# Patient Record
Sex: Male | Born: 1950 | Race: Asian | Hispanic: No | Marital: Single | Smoking: Current every day smoker
Health system: Southern US, Community
[De-identification: ages and names within clinical notes are randomized; demographics above are authoritative.]

## PROBLEM LIST (undated history)

## (undated) ENCOUNTER — Emergency Department (HOSPITAL_COMMUNITY): Admission: EM | Payer: Medicare Other

## (undated) DIAGNOSIS — M1990 Unspecified osteoarthritis, unspecified site: Secondary | ICD-10-CM

## (undated) DIAGNOSIS — B1920 Unspecified viral hepatitis C without hepatic coma: Secondary | ICD-10-CM

## (undated) DIAGNOSIS — G709 Myoneural disorder, unspecified: Secondary | ICD-10-CM

## (undated) DIAGNOSIS — K219 Gastro-esophageal reflux disease without esophagitis: Secondary | ICD-10-CM

## (undated) DIAGNOSIS — I959 Hypotension, unspecified: Secondary | ICD-10-CM

## (undated) DIAGNOSIS — F1011 Alcohol abuse, in remission: Secondary | ICD-10-CM

## (undated) HISTORY — DX: Unspecified viral hepatitis C without hepatic coma: B19.20

## (undated) HISTORY — DX: Gastro-esophageal reflux disease without esophagitis: K21.9

## (undated) SURGERY — Surgical Case
Anesthesia: *Unknown

---

## 2005-05-20 ENCOUNTER — Ambulatory Visit: Payer: Self-pay | Admitting: Family Medicine

## 2017-02-20 ENCOUNTER — Encounter (HOSPITAL_COMMUNITY): Payer: Self-pay | Admitting: *Deleted

## 2017-02-20 ENCOUNTER — Ambulatory Visit (HOSPITAL_COMMUNITY)
Admission: EM | Admit: 2017-02-20 | Discharge: 2017-02-20 | Disposition: A | Discharge status: Home / Self Care | Payer: Self-pay | Attending: Family Medicine | Admitting: Family Medicine

## 2017-02-20 DIAGNOSIS — R51 Headache: Secondary | ICD-10-CM

## 2017-02-20 DIAGNOSIS — R519 Headache, unspecified: Secondary | ICD-10-CM

## 2017-02-20 MED ORDER — IBUPROFEN 600 MG PO TABS: 600.0000 mg | ORAL_TABLET | Freq: Four times a day (QID) | ORAL | 0 refills | Status: DC | PRN

## 2017-02-20 NOTE — Discharge Instructions (Signed)
Try IBUPROFEN as prescribed as needed for headache. Do not use this more than 4 times per week as this may make the headache worse.   Call the number below for [facility] to establish care there for ongoing treatment of headache.   If your symptoms worsen, seek medical attention right away.   Get plenty of sleep, stay hydrated with water and STOP smoking. These will help with the headache.

## 2017-02-20 NOTE — ED Provider Notes (Signed)
[name]    CSN: [id number] Arrival date & time: 02/20/17  1445     History   Chief Complaint Chief Complaint  Patient presents with  . Headache    HPI Donald Aguilar is a 66 y.o. [name] male presenting for occipital headache for 2 - 3 years.   The headache is described as "aching" daily, moderate, constant throughout the day not worse with lights or position, no medications tried, not better with rest, darkness. It starts in the back of the head and radiates to the top on both sides not involving the face. No visual changes, neck pain/stiffness, or history of head trauma. Takes no medications. Smokes daily.   Family friend in room and serves as Optometrist per patient request. he declines alternative interpretation services.   HPI  History reviewed. No pertinent past medical history.  There are no active problems to display for this patient.   History reviewed. No pertinent surgical history.     Home Medications    Prior to Admission medications   Medication Sig Start Date End Date Taking? Authorizing Provider  ibuprofen (ADVIL,MOTRIN) 600 MG tablet Take 1 tablet (600 mg total) by mouth every 6 (six) hours as needed. 02/20/17   Patrecia Pour, MD    Family History History reviewed. No pertinent family history.  Social History Social History  Substance Use Topics  . Smoking status: Current Every Day Smoker  . Smokeless tobacco: Not on file  . Alcohol use Yes     Allergies   Patient has no known allergies.   Review of Systems Review of Systems Per HPI  Physical Exam Triage Vital Signs ED Triage Vitals  Enc Vitals Group     BP 02/20/17 1532 (!) 147/88     Pulse Rate 02/20/17 1532 74     Resp 02/20/17 1532 18     Temp 02/20/17 1532 98.6 F (37 C)     Temp Source 02/20/17 1532 Oral     SpO2 02/20/17 1532 100 %     Weight --      Height --      Head Circumference --      Peak Flow --      Pain Score 02/20/17 1535 5     Pain Loc --      Pain Edu? --      Excl. in [place]? --    No data found.   Updated Vital Signs BP (!) 147/88 (BP Location: Right Arm)   Pulse 74   Temp 98.6 F (37 C) (Oral)   Resp 18   SpO2 100%   Physical Exam  Constitutional: He is oriented to person, place, and time. He appears well-developed and well-nourished.  HENT:  Head: Normocephalic and atraumatic.  Left Ear: External ear normal.  Nose: Nose normal.  Mouth/Throat: No oropharyngeal exudate.  right ear canal occluded with cerumen. Very poor dentition.  Eyes: Conjunctivae and EOM are normal.  Neck: Neck supple.  Cardiovascular: Normal rate and regular rhythm.   No murmur heard. Pulmonary/Chest: Effort normal and breath sounds normal. No respiratory distress.  Abdominal: Soft. There is no tenderness.  Musculoskeletal: Normal range of motion. He exhibits no edema or deformity.  Lymphadenopathy:    He has no cervical adenopathy.  Neurological: He is alert and oriented to person, place, and time. He displays normal reflexes. No cranial nerve deficit or sensory deficit. He exhibits normal muscle tone.  Skin: Skin is warm and dry. Capillary refill takes less than  2 seconds.  Psychiatric: He has a normal mood and affect.  Nursing note and vitals reviewed.   UC Treatments / Results  Labs (all labs ordered are listed, but only abnormal results are displayed) Labs Reviewed - No data to display  EKG  EKG Interpretation None       Radiology No results found.  Procedures Procedures (including critical care time)  Medications Ordered in UC Medications - No data to display   Initial Impression / Assessment and Plan / UC Course  I have reviewed the triage vital signs and the nursing notes.  Pertinent labs & imaging results that were available during my care of the patient were reviewed by me and considered in my medical decision making (see chart for details).   Final Clinical Impressions(s) / UC Diagnoses   Final diagnoses:    Headache disorder   66 y.o. male with no medical follow up and chronic headache most typical of tension-type headache without prior treatments. No indication for neuroimaging at this time. Will treat as below: - Trial NSAID prn - Sleep hygiene, hydration - Smoking cessation - Would also likely benefit from propranolol or similar for primary headache prophylaxis and HTN (above goal for age here) - Given information to establish care with [facility] for ongoing management  New Prescriptions New Prescriptions   IBUPROFEN (ADVIL,MOTRIN) 600 MG TABLET    Take 1 tablet (600 mg total) by mouth every 6 (six) hours as needed.     Patrecia Pour, MD 02/20/17 [phone]

## 2017-02-20 NOTE — ED Triage Notes (Signed)
Pt  Reports   Headache    For  sev  Years       Awake  Alert  And  Alert       Oriented        No  Vomiting        pearla       Awake  And  Alert    Oriented    Denies  Any  injury

## 2019-08-21 DIAGNOSIS — K7460 Unspecified cirrhosis of liver: Secondary | ICD-10-CM

## 2019-08-21 HISTORY — DX: Unspecified cirrhosis of liver: K74.60

## 2019-09-07 NOTE — Congregational Nurse Program (Signed)
Home visit with interpreter [name].  Patient was referred by [facility] with whom patient is currently living and his niece [place].  They are concerned about patient's health and the fact he is refusing to seek medical care.  They are uncertain why he is refusing to seek treatment but think it may be because he has no insurance.  He has applied for Medicaid recently but is not yet approved.  Upon arrival patient was lying on sofa and was unable to sit up without assistance.  He was pleasant and cooperative and well oriented.  Abdomen was obviously enlarged and he stated it is painful when he moves and gets up.  States he has had these symptoms for about 1 month.  He also has swelling and numbness in his thighs but not below the knees.  Normal BM's daily.  States he has decreased smoking and alcohol intake.  Also c/o SOB.   Temp 98.6, BP 138/90, pulse 100 and resp. 20.  He does not have a medical provider but agreed to go to ED for evaluation.  [name] RN, Congregational Nurse [phone]

## 2019-09-09 ENCOUNTER — Inpatient Hospital Stay (HOSPITAL_COMMUNITY)
Admission: EM | Admit: 2019-09-09 | Discharge: 2019-09-15 | DRG: 432 | Disposition: A | Discharge status: Home / Self Care | Payer: Medicare Other | Attending: Internal Medicine | Admitting: Internal Medicine

## 2019-09-09 ENCOUNTER — Encounter (HOSPITAL_COMMUNITY): Payer: Self-pay | Admitting: Internal Medicine

## 2019-09-09 ENCOUNTER — Other Ambulatory Visit: Payer: Self-pay

## 2019-09-09 ENCOUNTER — Emergency Department (HOSPITAL_COMMUNITY): Payer: Medicare Other

## 2019-09-09 DIAGNOSIS — K8020 Calculus of gallbladder without cholecystitis without obstruction: Secondary | ICD-10-CM | POA: Diagnosis present

## 2019-09-09 DIAGNOSIS — R0602 Shortness of breath: Secondary | ICD-10-CM | POA: Diagnosis present

## 2019-09-09 DIAGNOSIS — K7469 Other cirrhosis of liver: Secondary | ICD-10-CM | POA: Diagnosis not present

## 2019-09-09 DIAGNOSIS — K2960 Other gastritis without bleeding: Secondary | ICD-10-CM | POA: Diagnosis present

## 2019-09-09 DIAGNOSIS — R188 Other ascites: Secondary | ICD-10-CM | POA: Diagnosis present

## 2019-09-09 DIAGNOSIS — I959 Hypotension, unspecified: Secondary | ICD-10-CM | POA: Diagnosis not present

## 2019-09-09 DIAGNOSIS — E43 Unspecified severe protein-calorie malnutrition: Secondary | ICD-10-CM | POA: Diagnosis not present

## 2019-09-09 DIAGNOSIS — K7290 Hepatic failure, unspecified without coma: Secondary | ICD-10-CM | POA: Diagnosis present

## 2019-09-09 DIAGNOSIS — M6284 Sarcopenia: Secondary | ICD-10-CM | POA: Diagnosis present

## 2019-09-09 DIAGNOSIS — B1920 Unspecified viral hepatitis C without hepatic coma: Secondary | ICD-10-CM | POA: Diagnosis present

## 2019-09-09 DIAGNOSIS — R7989 Other specified abnormal findings of blood chemistry: Secondary | ICD-10-CM | POA: Diagnosis not present

## 2019-09-09 DIAGNOSIS — Z79899 Other long term (current) drug therapy: Secondary | ICD-10-CM | POA: Diagnosis not present

## 2019-09-09 DIAGNOSIS — Z20828 Contact with and (suspected) exposure to other viral communicable diseases: Secondary | ICD-10-CM | POA: Diagnosis present

## 2019-09-09 DIAGNOSIS — K3189 Other diseases of stomach and duodenum: Secondary | ICD-10-CM | POA: Diagnosis present

## 2019-09-09 DIAGNOSIS — R64 Cachexia: Secondary | ICD-10-CM | POA: Diagnosis present

## 2019-09-09 DIAGNOSIS — K7460 Unspecified cirrhosis of liver: Principal | ICD-10-CM | POA: Diagnosis present

## 2019-09-09 DIAGNOSIS — K766 Portal hypertension: Secondary | ICD-10-CM | POA: Diagnosis present

## 2019-09-09 DIAGNOSIS — E8770 Fluid overload, unspecified: Secondary | ICD-10-CM | POA: Diagnosis present

## 2019-09-09 DIAGNOSIS — D689 Coagulation defect, unspecified: Secondary | ICD-10-CM | POA: Diagnosis present

## 2019-09-09 DIAGNOSIS — N400 Enlarged prostate without lower urinary tract symptoms: Secondary | ICD-10-CM | POA: Diagnosis present

## 2019-09-09 DIAGNOSIS — E46 Unspecified protein-calorie malnutrition: Secondary | ICD-10-CM | POA: Diagnosis present

## 2019-09-09 DIAGNOSIS — J189 Pneumonia, unspecified organism: Secondary | ICD-10-CM | POA: Diagnosis present

## 2019-09-09 DIAGNOSIS — F17200 Nicotine dependence, unspecified, uncomplicated: Secondary | ICD-10-CM | POA: Diagnosis present

## 2019-09-09 DIAGNOSIS — I8510 Secondary esophageal varices without bleeding: Secondary | ICD-10-CM | POA: Diagnosis present

## 2019-09-09 DIAGNOSIS — R935 Abnormal findings on diagnostic imaging of other abdominal regions, including retroperitoneum: Secondary | ICD-10-CM

## 2019-09-09 DIAGNOSIS — B182 Chronic viral hepatitis C: Secondary | ICD-10-CM | POA: Diagnosis not present

## 2019-09-09 DIAGNOSIS — E8809 Other disorders of plasma-protein metabolism, not elsewhere classified: Secondary | ICD-10-CM | POA: Diagnosis not present

## 2019-09-09 HISTORY — DX: Other ascites: R18.8

## 2019-09-09 LAB — URINALYSIS, ROUTINE W REFLEX MICROSCOPIC
Bilirubin Urine: NEGATIVE
Glucose, UA: NEGATIVE mg/dL
Hgb urine dipstick: NEGATIVE
Ketones, ur: NEGATIVE mg/dL
Leukocytes,Ua: NEGATIVE
Nitrite: NEGATIVE
Protein, ur: 30 mg/dL — AB
Specific Gravity, Urine: >1.046 — ABNORMAL HIGH (ref 1.005–1.030)
pH: 6 (ref 5.0–8.0)

## 2019-09-09 LAB — COMPREHENSIVE METABOLIC PANEL
ALT: 29 U/L (ref 0–44)
AST: 64 U/L — ABNORMAL HIGH (ref 15–41)
Albumin: 1.9 g/dL — ABNORMAL LOW (ref 3.5–5.0)
Alkaline Phosphatase: 77 U/L (ref 38–126)
Anion gap: 11 (ref 5–15)
BUN: 6 mg/dL — ABNORMAL LOW (ref 8–23)
CO2: 26 mmol/L (ref 22–32)
Calcium: 8.4 mg/dL — ABNORMAL LOW (ref 8.9–10.3)
Chloride: 102 mmol/L (ref 98–111)
Creatinine, Ser: 0.82 mg/dL (ref 0.61–1.24)
GFR calc Af Amer: >60 mL/min (ref >60)
GFR calc non Af Amer: >60 mL/min (ref >60)
Glucose, Bld: 124 mg/dL — ABNORMAL HIGH (ref 70–99)
Potassium: 3.3 mmol/L — ABNORMAL LOW (ref 3.5–5.1)
Sodium: 139 mmol/L (ref 135–145)
Total Bilirubin: 2.8 mg/dL — ABNORMAL HIGH (ref 0.3–1.2)
Total Protein: 8.2 g/dL — ABNORMAL HIGH (ref 6.5–8.1)

## 2019-09-09 LAB — LACTIC ACID, PLASMA
Lactic Acid, Venous: 1.5 mmol/L (ref 0.5–1.9)
Lactic Acid, Venous: 1.4 mmol/L (ref 0.5–1.9)

## 2019-09-09 LAB — HEPATITIS PANEL, ACUTE
HCV Ab: REACTIVE — AB
Hep A IgM: NONREACTIVE
Hep B C IgM: NONREACTIVE
Hepatitis B Surface Ag: NONREACTIVE

## 2019-09-09 LAB — CBC WITH DIFFERENTIAL/PLATELET
Abs Immature Granulocytes: 0.03 10*3/uL (ref 0.00–0.07)
Basophils Absolute: 0.1 10*3/uL (ref 0.0–0.1)
Basophils Relative: 1 %
Eosinophils Absolute: 0.2 10*3/uL (ref 0.0–0.5)
Eosinophils Relative: 2 %
HCT: 40.8 % (ref 39.0–52.0)
Hemoglobin: 13.2 g/dL (ref 13.0–17.0)
Immature Granulocytes: 0 %
Lymphocytes Relative: 19 %
Lymphs Abs: 1.5 10*3/uL (ref 0.7–4.0)
MCH: 32.4 pg (ref 26.0–34.0)
MCHC: 32.4 g/dL (ref 30.0–36.0)
MCV: 100 fL (ref 80.0–100.0)
Monocytes Absolute: 0.8 10*3/uL (ref 0.1–1.0)
Monocytes Relative: 11 %
Neutro Abs: 5.3 10*3/uL (ref 1.7–7.7)
Neutrophils Relative %: 67 %
Platelets: 222 10*3/uL (ref 150–400)
RBC: 4.08 MIL/uL — ABNORMAL LOW (ref 4.22–5.81)
RDW: 14.3 % (ref 11.5–15.5)
WBC: 7.9 10*3/uL (ref 4.0–10.5)
nRBC: 0 % (ref 0.0–0.2)

## 2019-09-09 LAB — PROCALCITONIN: Procalcitonin: <0.1 ng/mL

## 2019-09-09 LAB — LIPASE, BLOOD: Lipase: 44 U/L (ref 11–51)

## 2019-09-09 LAB — PROTIME-INR
INR: 1.6 — ABNORMAL HIGH (ref 0.8–1.2)
Prothrombin Time: 18.5 seconds — ABNORMAL HIGH (ref 11.4–15.2)

## 2019-09-09 LAB — AMMONIA: Ammonia: 60 umol/L — ABNORMAL HIGH (ref 9–35)

## 2019-09-09 LAB — SARS CORONAVIRUS 2 (TAT 6-24 HRS): SARS Coronavirus 2: NEGATIVE

## 2019-09-09 LAB — HIV ANTIBODY (ROUTINE TESTING W REFLEX): HIV Screen 4th Generation wRfx: NONREACTIVE

## 2019-09-09 LAB — POC SARS CORONAVIRUS 2 AG -  ED: SARS Coronavirus 2 Ag: NEGATIVE

## 2019-09-09 IMAGING — DX DG CHEST 1V PORT
1 series · 1 of 1 positions shown · non-contrast
Comparison: None.

CLINICAL DATA: Shortness of breath.

EXAM:
PORTABLE CHEST 1 VIEW

[chest]
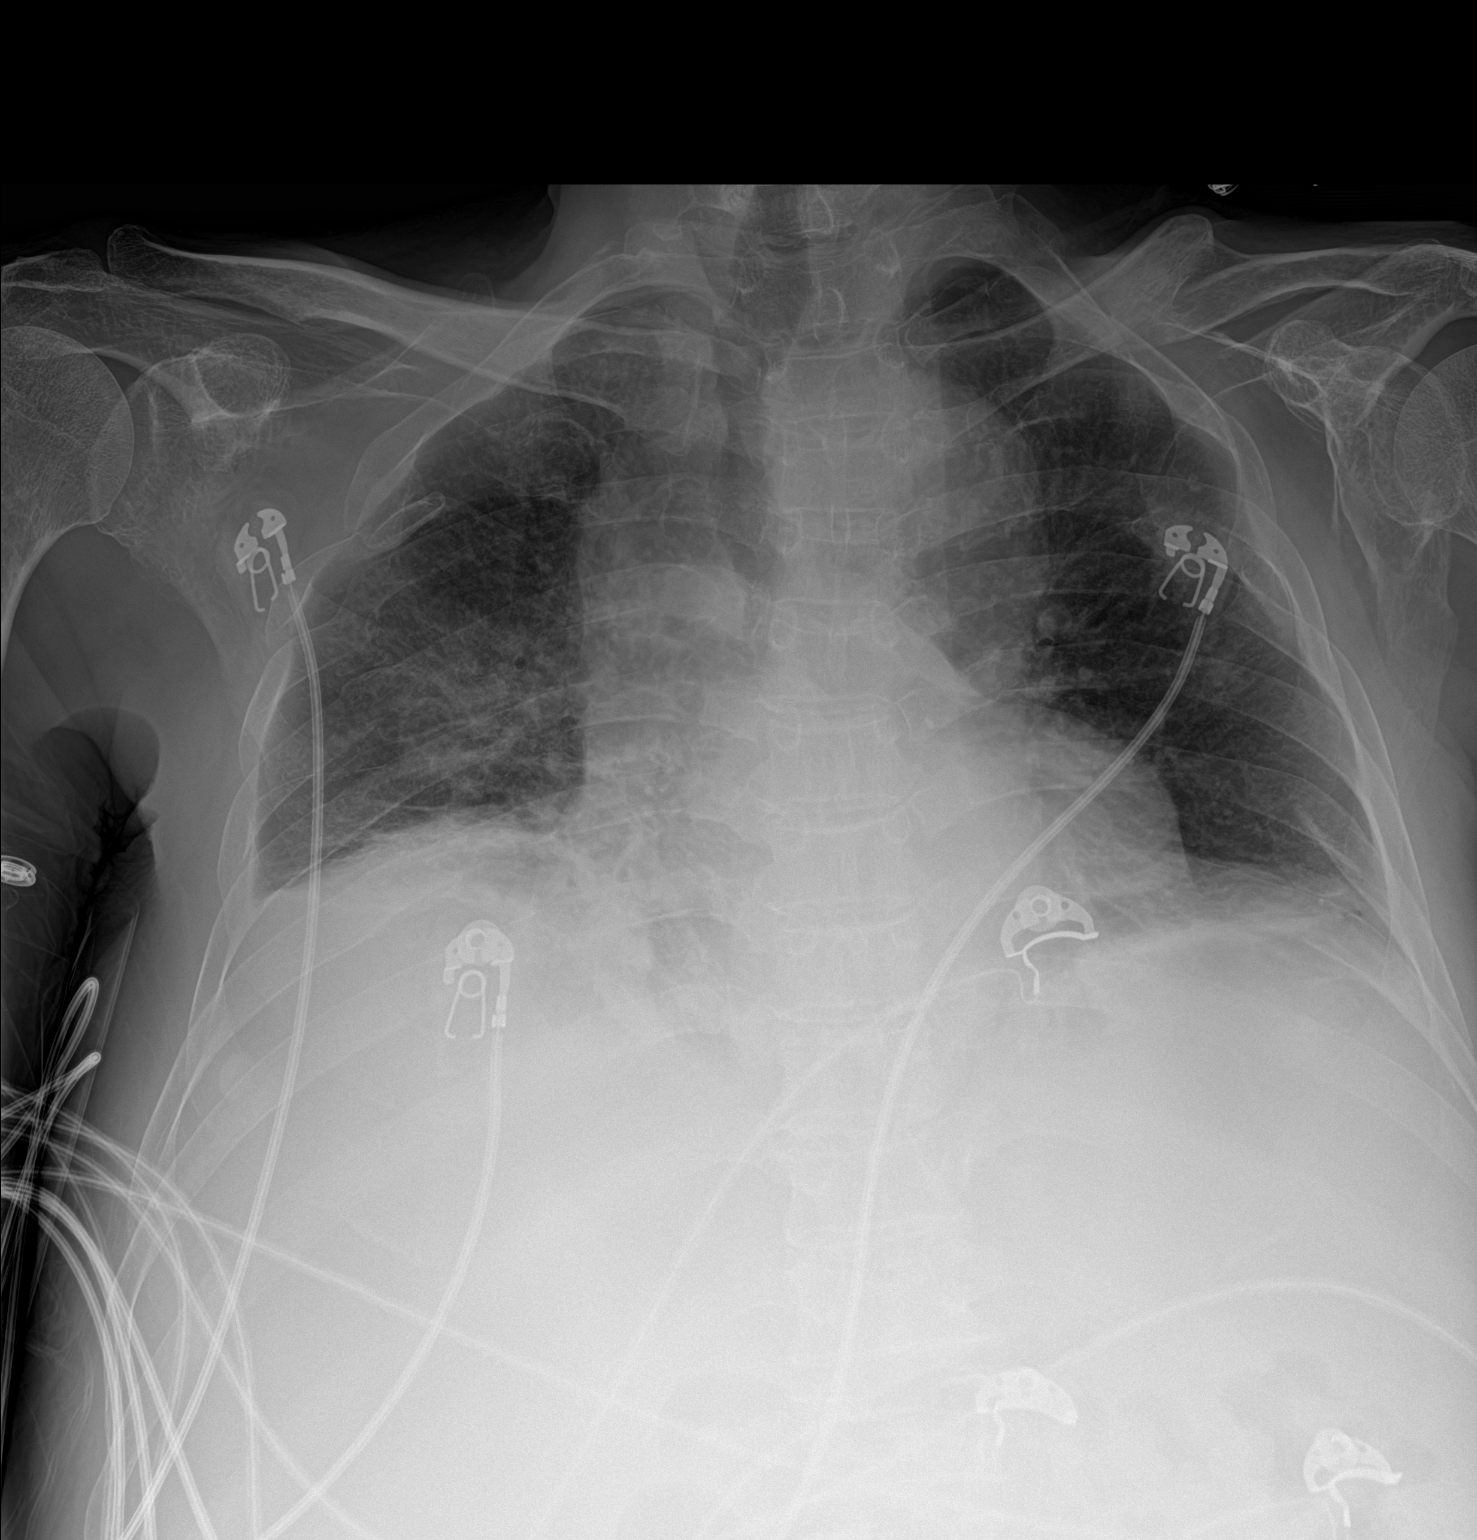

[1 of 1 positions shown; findings below may reference images not displayed]

FINDINGS: Mild cardiomegaly is noted. No pneumothorax or pleural effusion is
noted. Mild bibasilar subsegmental atelectasis is noted. Right lower
lobe airspace opacity is noted concerning for possible pneumonia.
Old left clavicular fracture is noted.
IMPRESSION: Mild bibasilar subsegmental atelectasis. Right lower lobe airspace
opacity is noted concerning for possible pneumonia.

## 2019-09-09 IMAGING — CT CT ABD-PELV W/ CM
2 of 6 series · 14 of 46 positions shown, 16 images · IV contrast (APPLIED)
Comparison: None.

CLINICAL DATA: Abdominal pain and distention.

EXAM:
CT ABDOMEN AND PELVIS WITH CONTRAST
TECHNIQUE: Multidetector CT imaging of the abdomen and pelvis was performed
using the standard protocol following bolus administration of
intravenous contrast.
CONTRAST:  100mL OMNIPAQUE IOHEXOL 300 MG/ML  SOLN

[Series 3: abd/ pelvis 5.0 i30f 2 · axial · 0.96mm/px · z∈[+604,+1054]mm · 11 of 108 slices shown, 13 images]
[im 9/108  soft-tissue]
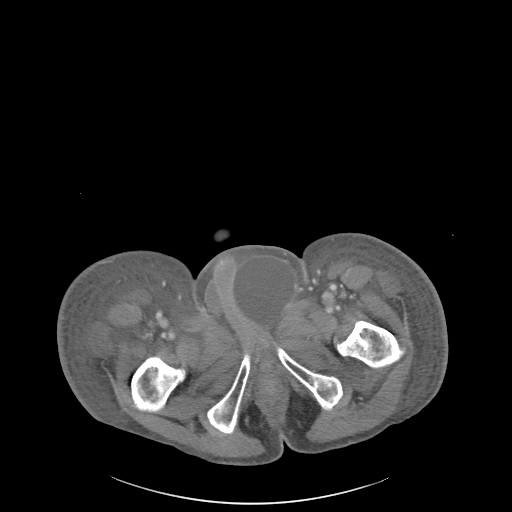
[im 9/108  bone]
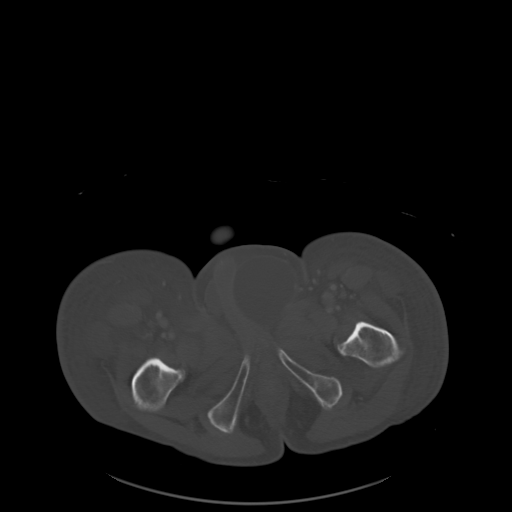
[im 18/108  soft-tissue]
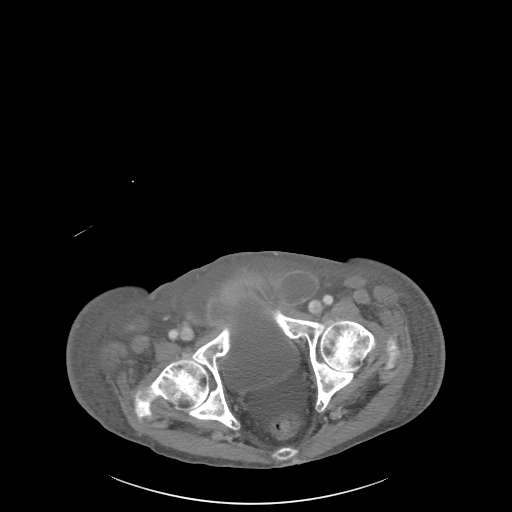
[im 27/108  soft-tissue]
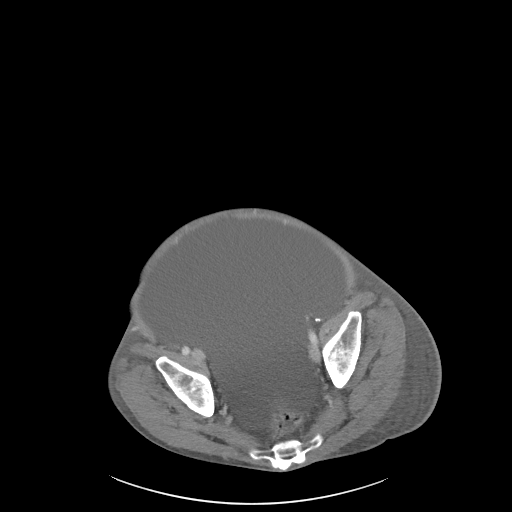
[im 36/108  soft-tissue]
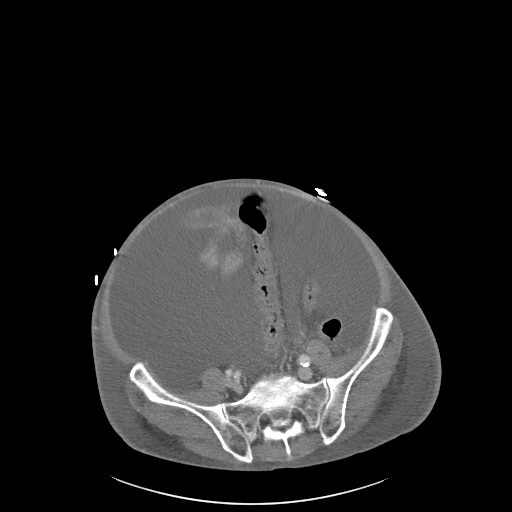
[im 45/108  soft-tissue]
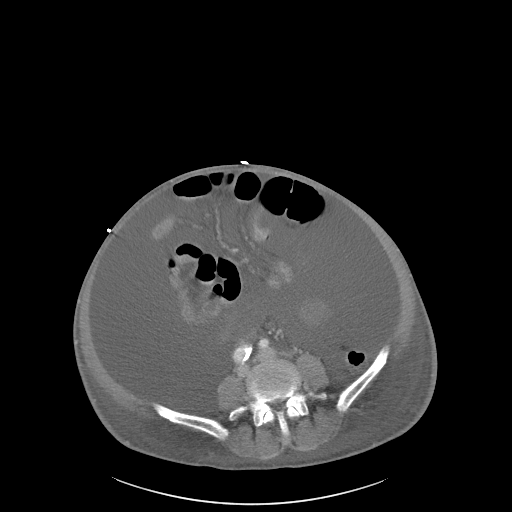
[im 54/108  soft-tissue]
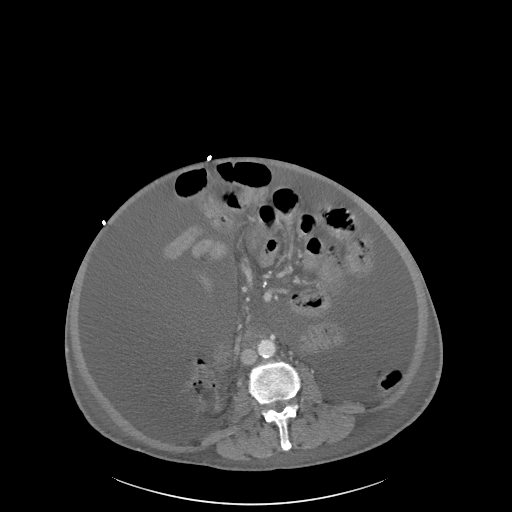
[im 63/108  soft-tissue]
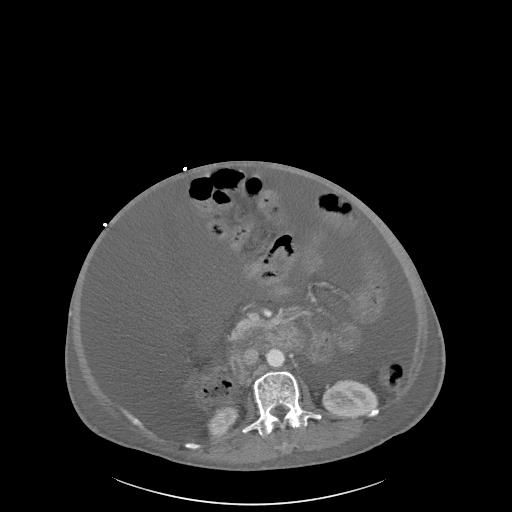
[im 72/108  soft-tissue]
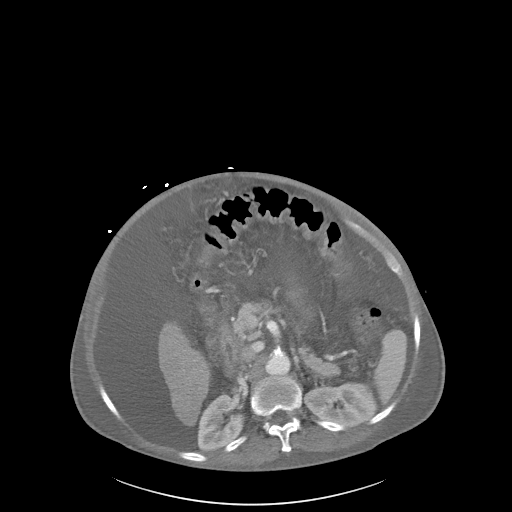
[im 81/108  soft-tissue]
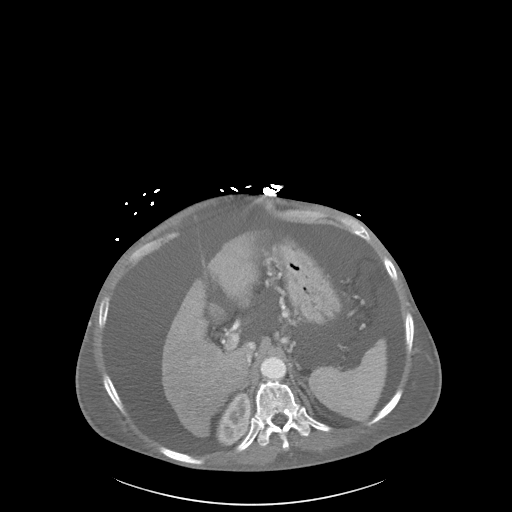
[im 81/108  bone]
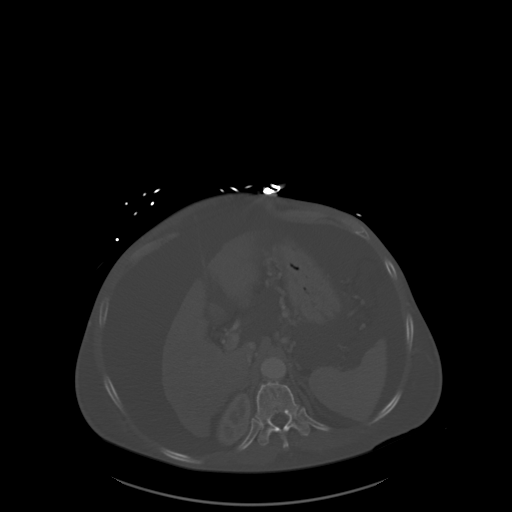
[im 90/108  soft-tissue]
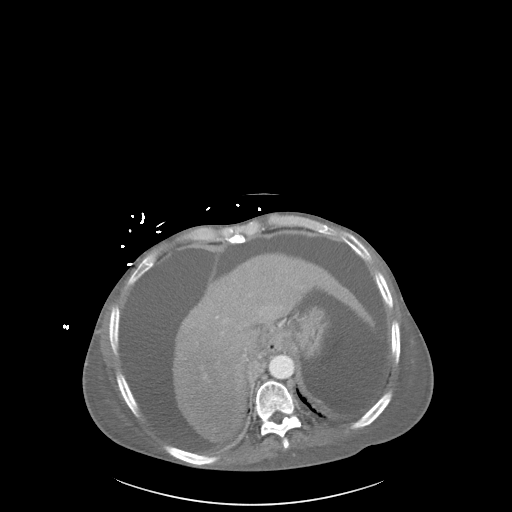
[im 99/108  soft-tissue]
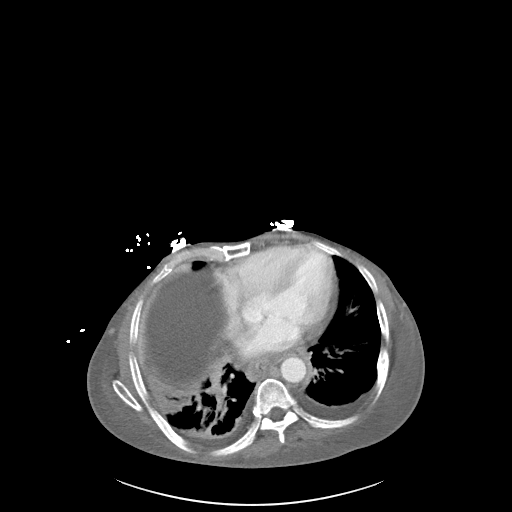

[Series 8: coronal soft tissue · coronal · 0.85mm/px · 3 of 111 slices shown]
[im 37/111  soft-tissue]
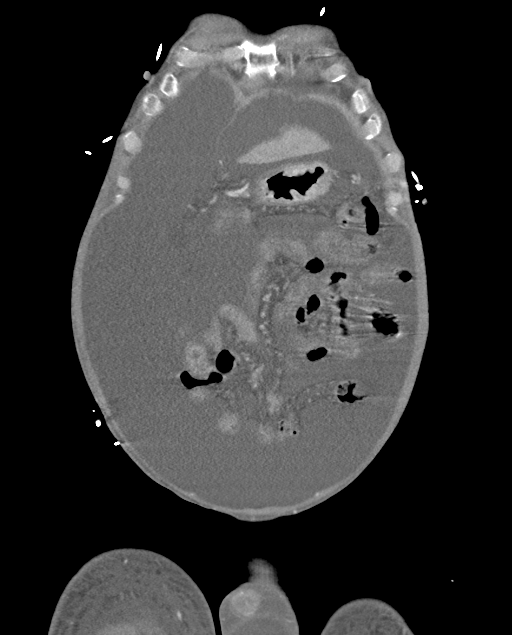
[im 49/111  soft-tissue]
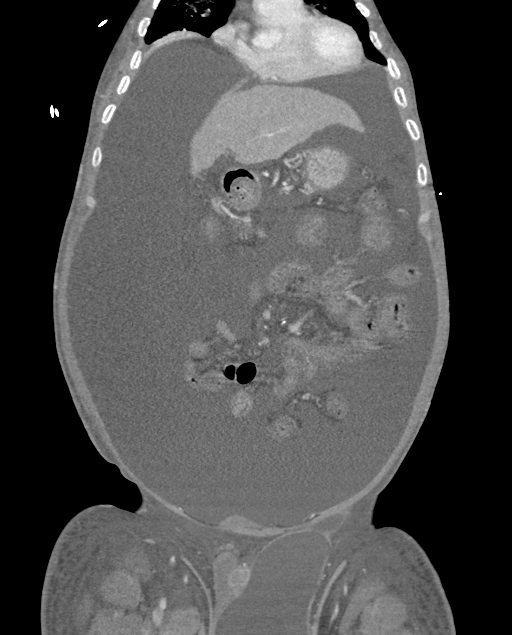
[im 62/111  soft-tissue]
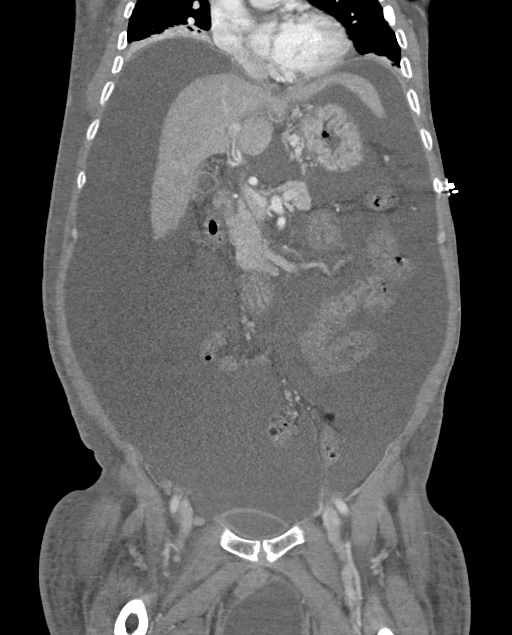

[14 of 46 positions shown; findings below may reference images not displayed]

FINDINGS: Lower chest: Ground-glass airspace opacities are noted in the left
upper lobe lingula and right middle lobe. More confluent opacity is
noted in the dependent right middle lobe adjacent to the
hemidiaphragm and the lower lobes. Small bilateral pleural
effusions.

Hepatobiliary: Morphologic changes of cirrhosis with central volume
loss and liver nodularity. No discrete liver mass. There are several
calcifications consistent with healed granuloma. Increased
attenuation material is noted in the gallbladder neck consistent
with small stones or sludge. No gallbladder wall thickening. No
evidence of acute cholecystitis. No bile duct dilation.

Pancreas: Unremarkable. No pancreatic ductal dilatation or
surrounding inflammatory changes.

Spleen: Normal in size without focal abnormality.

Adrenals/Urinary Tract: Adrenal glands are unremarkable. Kidneys are
normal, without renal calculi, focal lesion, or hydronephrosis.
Bladder is unremarkable.

Stomach/Bowel: Stomach is unremarkable. Small bowel and colon are
normal in caliber. There are fluid levels noted along the small
bowel. No convincing wall thickening or inflammation. Normal
appendix.

Vascular/Lymphatic: There are upper abdominal varices, which are
prominent along the lesser curvature of the stomach contiguous with
paraesophageal varices. Aortic atherosclerosis. No aneurysm. Normal
enhancement of the portal, splenic and superior mesenteric veins.

No enlarged lymph nodes.

Reproductive: Prostate normal in size. Left inguinal canal is
distended with fluid that extends into the scrotum.

Other: Large amount of ascites which distends the abdomen. Diffuse
subcutaneous edema.

Musculoskeletal: No fracture or acute finding.  No bone lesion.
IMPRESSION: 1. Ground-glass opacities in the lung bases consistent with
multifocal pneumonia. This appearance can be seen with [HW]
pneumonia.
2. Large amount of ascites which distends the abdomen. This is due
to cirrhosis with portal venous hypertension. There are also varices
including prominent paraesophageal varices.
3. No visualized liver mass.
4. Small effusions and diffuse subcutaneous edema.
5. No evidence of a bowel obstruction. Small-bowel air-fluid levels
is consistent with a mild adynamic ileus.

## 2019-09-09 MED ORDER — ACETAMINOPHEN 325 MG PO TABS
650.0000 mg | ORAL_TABLET | Freq: Four times a day (QID) | ORAL | Status: DC | PRN
  Administered 2019-09-11 – 2019-09-14 (×4): 650 mg via ORAL
  Filled 2019-09-09 (×4): qty 2

## 2019-09-09 MED ORDER — ONDANSETRON HCL 4 MG PO TABS: 4.0000 mg | ORAL_TABLET | Freq: Four times a day (QID) | ORAL | Status: DC | PRN

## 2019-09-09 MED ORDER — SODIUM CHLORIDE 0.9 % IV SOLN
1.0000 g | INTRAVENOUS | Status: DC
  Administered 2019-09-10 – 2019-09-11 (×2): 1 g via INTRAVENOUS
  Filled 2019-09-09 (×2): qty 1
  Filled 2019-09-09: qty 10

## 2019-09-09 MED ORDER — SODIUM CHLORIDE 0.9 % IV SOLN
1.0000 g | Freq: Once | INTRAVENOUS | Status: AC
  Administered 2019-09-09: 1 g via INTRAVENOUS
  Filled 2019-09-09: qty 10

## 2019-09-09 MED ORDER — DOCUSATE SODIUM 100 MG PO CAPS
100.0000 mg | ORAL_CAPSULE | Freq: Two times a day (BID) | ORAL | Status: DC
  Administered 2019-09-09 – 2019-09-15 (×11): 100 mg via ORAL
  Filled 2019-09-09 (×11): qty 1

## 2019-09-09 MED ORDER — ACETAMINOPHEN 650 MG RE SUPP: 650.0000 mg | Freq: Four times a day (QID) | RECTAL | Status: DC | PRN

## 2019-09-09 MED ORDER — IOHEXOL 300 MG/ML  SOLN
100.0000 mL | Freq: Once | INTRAMUSCULAR | Status: AC | PRN
  Administered 2019-09-09: 100 mL via INTRAVENOUS

## 2019-09-09 MED ORDER — SODIUM CHLORIDE 0.9 % IV SOLN
500.0000 mg | INTRAVENOUS | Status: DC
  Administered 2019-09-10 – 2019-09-11 (×2): 500 mg via INTRAVENOUS
  Filled 2019-09-09 (×3): qty 500

## 2019-09-09 MED ORDER — AZITHROMYCIN 500 MG IV SOLR
500.0000 mg | Freq: Once | INTRAVENOUS | Status: AC
  Administered 2019-09-09: 500 mg via INTRAVENOUS
  Filled 2019-09-09: qty 500

## 2019-09-09 MED ORDER — ONDANSETRON HCL 4 MG/2ML IJ SOLN: 4.0000 mg | Freq: Four times a day (QID) | INTRAMUSCULAR | Status: DC | PRN

## 2019-09-09 NOTE — ED Provider Notes (Signed)
[facility] EMERGENCY DEPARTMENT Provider Note   CSN: [id number] Arrival date & time: 09/09/19  1054     History   Chief Complaint Chief Complaint  Patient presents with  . Abdominal Pain    HPI Donald Aguilar is a 68 y.o. male.     HPI  Used montagnard interpreter Pt moved from [place] approx one month ago  Abd pain 1.5 months abd full, feels like distention/gas No known hx of liver disease Reports goes to restroom, when urinate it is difficult to urinate, goes slowly, no dysuria Normal daily BM, but thick stool, no black or bloody stools Notes some dyspnea, believes slowly worsening with abdominal distention No chest pain, no cough No fever Appetite has been low, feels fullness in abdomen  In [place] was diagnosed with enlarged prostate Drinks 1 beer each day    Past Medical History:  Diagnosis Date  . 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 Abdominal ascites 09/09/2019  . Shortness of breath 09/09/2019    No past surgical history on file.      Home Medications    Prior to Admission medications   Not on File    Family History No family history on file.  Social History Social History   Tobacco Use  . Smoking status: Current Every Day Smoker  Substance Use Topics  . Alcohol use: Yes  . Drug use: Not on file     Allergies   Patient has no known allergies.   Review of Systems Review of Systems  Constitutional: Positive for appetite change. Negative for fever.  Respiratory: Positive for shortness of breath. Negative for cough.   Cardiovascular: Negative for chest pain.  Gastrointestinal: Positive for abdominal distention and abdominal pain. Negative for constipation, diarrhea, nausea and vomiting.  Genitourinary: Positive for difficulty urinating. Negative for dysuria.  Skin: Negative for rash.  Neurological:  Negative for headaches.     Physical Exam Updated Vital Signs BP 116/88   Pulse (!) 102   Temp 98.1 F (36.7 C) (Oral)   Resp 18   SpO2 95%   Physical Exam Vitals signs and nursing note reviewed.  Constitutional:      General: He is not in acute distress.    Appearance: He is well-developed. He is not diaphoretic.  HENT:     Head: Normocephalic and atraumatic.  Eyes:     Conjunctiva/sclera: Conjunctivae normal.  Neck:     Musculoskeletal: Normal range of motion.  Cardiovascular:     Rate and Rhythm: Normal rate and regular rhythm.     Heart sounds: Normal heart sounds.  Pulmonary:     Effort: Pulmonary effort is normal. No respiratory distress.     Breath sounds: Normal breath sounds.  Abdominal:     General: There is no distension.     Palpations: Abdomen is soft. There is fluid wave.     Tenderness: There is abdominal tenderness. There is no guarding.  Skin:    General: Skin is warm and dry.  Neurological:     Mental Status: He is alert and oriented to person, place, and time.      ED Treatments / Results  Labs (all labs ordered are listed, but only abnormal results are displayed) Labs Reviewed  CBC WITH DIFFERENTIAL/PLATELET - Abnormal; Notable for the following components:      Result Value   RBC  4.08 (*)    All other components within normal limits  COMPREHENSIVE METABOLIC PANEL - Abnormal; Notable for the following components:   Potassium 3.3 (*)    Glucose, Bld 124 (*)    BUN 6 (*)    Calcium 8.4 (*)    Total Protein 8.2 (*)    Albumin 1.9 (*)    AST 64 (*)    Total Bilirubin 2.8 (*)    All other components within normal limits  PROTIME-INR - Abnormal; Notable for the following components:   Prothrombin Time 18.5 (*)    INR 1.6 (*)    All other components within normal limits  AMMONIA - Abnormal; Notable for the following components:   Ammonia 60 (*)    All other components within normal limits  HEPATITIS PANEL, ACUTE - Abnormal; Notable for  the following components:   HCV Ab Reactive (*)    All other components within normal limits  URINE CULTURE  SARS CORONAVIRUS 2 (TAT 6-24 HRS)  CULTURE, BLOOD (ROUTINE X 2)  CULTURE, BLOOD (ROUTINE X 2)  LIPASE, BLOOD  LACTIC ACID, PLASMA  LACTIC ACID, PLASMA  HIV ANTIBODY (ROUTINE TESTING W REFLEX)  PROCALCITONIN  URINALYSIS, ROUTINE W REFLEX MICROSCOPIC  CBC  COMPREHENSIVE METABOLIC PANEL  PROTIME-INR  POC SARS CORONAVIRUS 2 AG -  ED    EKG EKG Interpretation  Date/Time:  Friday September 09 2019 10:58:03 EST Ventricular Rate:  106 PR Interval:    QRS Duration: 76 QT Interval:  363 QTC Calculation: 482 R Axis:   50 Text Interpretation: Sinus tachycardia Abnormal R-wave progression, early transition Borderline T abnormalities, diffuse leads Borderline prolonged QT interval No previous ECGs available Confirmed by Gareth Morgan [phone]) on 09/09/2019 11:42:41 AM   Radiology Ct Abdomen Pelvis W Contrast  Result Date: 09/09/2019 CLINICAL DATA:  Abdominal pain and distention. EXAM: CT ABDOMEN AND PELVIS WITH CONTRAST TECHNIQUE: Multidetector CT imaging of the abdomen and pelvis was performed using the standard protocol following bolus administration of intravenous contrast. CONTRAST:  134mL OMNIPAQUE IOHEXOL 300 MG/ML  SOLN COMPARISON:  None. FINDINGS: Lower chest: Ground-glass airspace opacities are noted in the left upper lobe lingula and right middle lobe. More confluent opacity is noted in the dependent right middle lobe adjacent to the hemidiaphragm and the lower lobes. Small bilateral pleural effusions. Hepatobiliary: Morphologic changes of cirrhosis with central volume loss and liver nodularity. No discrete liver mass. There are several calcifications consistent with healed granuloma. Increased attenuation material is noted in the gallbladder neck consistent with small stones or sludge. No gallbladder wall thickening. No evidence of acute cholecystitis. No bile duct dilation.  Pancreas: Unremarkable. No pancreatic ductal dilatation or surrounding inflammatory changes. Spleen: Normal in size without focal abnormality. Adrenals/Urinary Tract: Adrenal glands are unremarkable. Kidneys are normal, without renal calculi, focal lesion, or hydronephrosis. Bladder is unremarkable. Stomach/Bowel: Stomach is unremarkable. Small bowel and colon are normal in caliber. There are fluid levels noted along the small bowel. No convincing wall thickening or inflammation. Normal appendix. Vascular/Lymphatic: There are upper abdominal varices, which are prominent along the lesser curvature of the stomach contiguous with paraesophageal varices. Aortic atherosclerosis. No aneurysm. Normal enhancement of the portal, splenic and superior mesenteric veins. No enlarged lymph nodes. Reproductive: Prostate normal in size. Left inguinal canal is distended with fluid that extends into the scrotum. Other: Large amount of ascites which distends the abdomen. Diffuse subcutaneous edema. Musculoskeletal: No fracture or acute finding.  No bone lesion. IMPRESSION: 1. Ground-glass opacities in the lung bases consistent  with multifocal pneumonia. This appearance can be seen with COVID-19 pneumonia. 2. Large amount of ascites which distends the abdomen. This is due to cirrhosis with portal venous hypertension. 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 3. No visualized liver mass. 4. Small effusions and diffuse subcutaneous edema. 5. No evidence of a bowel obstruction. Small-bowel air-fluid levels is consistent with a mild adynamic ileus. Electronically Signed   By: Lajean Manes M.D.   On: 09/09/2019 15:51   Dg Chest Portable 1 View  Result Date: 09/09/2019 CLINICAL DATA:  Shortness of breath. EXAM: PORTABLE CHEST 1 VIEW COMPARISON:  None. FINDINGS: Mild cardiomegaly is noted. No pneumothorax or pleural effusion is noted. Mild bibasilar subsegmental atelectasis is noted. Right lower lobe airspace  opacity is noted concerning for possible pneumonia. Old left clavicular fracture is noted. IMPRESSION: Mild bibasilar subsegmental atelectasis. Right lower lobe airspace opacity is noted concerning for possible pneumonia. Electronically Signed   By: Marijo Conception M.D.   On: 09/09/2019 12:56    Procedures Procedures (including critical care time)  Medications Ordered in ED Medications  acetaminophen (TYLENOL) tablet 650 mg (has no administration in time range)    Or  acetaminophen (TYLENOL) suppository 650 mg (has no administration in time range)  docusate sodium (COLACE) capsule 100 mg (has no administration in time range)  ondansetron (ZOFRAN) tablet 4 mg (has no administration in time range)    Or  ondansetron (ZOFRAN) injection 4 mg (has no administration in time range)  azithromycin (ZITHROMAX) 500 mg in sodium chloride 0.9 % 250 mL IVPB (has no administration in time range)  cefTRIAXone (ROCEPHIN) 1 g in sodium chloride 0.9 % 100 mL IVPB (has no administration in time range)  iohexol (OMNIPAQUE) 300 MG/ML solution 100 mL (100 mLs Intravenous Contrast Given 09/09/19 1528)  cefTRIAXone (ROCEPHIN) 1 g in sodium chloride 0.9 % 100 mL IVPB (0 g Intravenous Stopped 09/09/19 2002)  azithromycin (ZITHROMAX) 500 mg in sodium chloride 0.9 % 250 mL IVPB (0 mg Intravenous Stopped 09/09/19 1808)     Initial Impression / Assessment and Plan / ED Course  I have reviewed the triage vital signs and the nursing notes.  Pertinent labs & imaging results that were available during my care of the patient were reviewed by me and considered in my medical decision making (see chart for details).        68yo male who moved from [place] and has not yet established care, with history of prostate enlargement and reports no history of other known medical problems presents with concern for abdominal pain.  Exam concerning for ascites, and labwork concerning for cirrhosis.  No known history of this or liver  abnormalities.  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  CT abdomen pelvis and UA pending. Anticipate likely admission.   Final Clinical Impressions(s) / ED Diagnoses   Final diagnoses:  Cirrhosis of liver with ascites, unspecified hepatic cirrhosis type ([place])  Shortness of breath    ED Discharge Orders    None       Gareth Morgan, MD 09/09/19 2019

## 2019-09-09 NOTE — ED Notes (Signed)
Patient transported to CT 

## 2019-09-09 NOTE — ED Triage Notes (Signed)
Pt BIB [facility] from home for abdominal distention that has been going on since he moved to the [place] 1 month ago. Pt has had increased pain and distention that has been getting worse. Per EMS patient does not take any medications and has no past medical history. Pt speaks some english but primarily montagnard.

## 2019-09-09 NOTE — H&P (Addendum)
History and Physical    Donald Aguilar [id number] DOB: 07-15-1951 DOA: 09/09/2019  PCP: Patient, No Pcp Per Consultants:  None Patient coming from:  Home - lives alone; NOK: Niece, [phone]  Chief Complaint: Abdominal pain/distention  HPI: Donald Aguilar is a 68 y.o. male without known medical history presenting with abdominal pain/distention.  He reports 1 month h/o abdominal distention with increasing SOB and intermittent cough.  +early satiety.  No fever.  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  Further information was difficult to obtain due to language difficulty; his niece did not answer the phone when I called her.  Denies h/o ever drinking.   ED Course:  [name] here for a month.  Worsening abdominal distention, now with SOB.  Abdominal distension.  Bili 2.8.  CT with significant cirrhosis, ?multifocal PNA, ?COVID vs. From new-onset cirrhosis.  COVID pending without classic symptoms.  Likely needs GI consult, paracentesis.  Review of Systems: As per HPI; otherwise review of systems reviewed and negative.  Limited by language barrier.   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  Social History   Socioeconomic History   Marital status: Married    Spouse name: Not on file   Number of children: Not on file   Years of education: Not on file   Highest education level: Not on file  Occupational History   Not on file  Social Needs   Financial resource strain: Not on file   Food insecurity    Worry: Not on file    Inability: Not on file   Transportation needs    Medical: Not on file    Non-medical: Not on file  Tobacco Use   Smoking status: Current Every Day Smoker  Substance and Sexual Activity   Alcohol use: Yes   Drug use: Not on file   Sexual activity: Not on file  Lifestyle   Physical activity    Days per week: Not on file    Minutes per  session: Not on file   Stress: Not on file  Relationships   Social connections    Talks on phone: Not on file    Gets together: Not on file    Attends religious service: Not on file    Active member of club or organization: Not on file    Attends meetings of clubs or organizations: Not on file    Relationship status: Not on file   Intimate partner violence    Fear of current or ex partner: Not on file    Emotionally abused: Not on file    Physically abused: Not on file    Forced sexual activity: Not on file  Other Topics Concern   Not on file  Social History Narrative   Not on file    No Known Allergies  No family history on file.  Prior to Admission medications   Medication Sig Start Date End Date Taking? Authorizing Provider  ibuprofen (ADVIL,MOTRIN) 600 MG tablet Take 1 tablet (600 mg total) by mouth every 6 (six) hours as needed. 02/20/17   Patrecia Pour, MD    Physical Exam: Vitals:   09/09/19 1415 09/09/19 1430 09/09/19 1630 09/09/19 1705  BP: (!) 125/93 (!) 124/101  113/79  Pulse: (!) 104 (!) 107 (!) 102 (!) 102  Resp: 15 16  18   Temp:      TempSrc:  SpO2: 94% 94% 99% 94%      General:  Appears calm and comfortable and is NAD  Eyes:  PERRL, EOMI, normal lids, iris  ENT:  grossly normal hearing, lips & tongue, mmm  Neck:  no LAD, masses or thyromegaly  Cardiovascular:  RR with mild tachycardia, no m/r/g. No LE edema.   Respiratory:   Scattered rhonchi.  Normal respiratory effort.  Abdomen:  Taut with severe distention, tympanic  Skin:  no rash or induration seen on limited exam  Musculoskeletal:  grossly normal tone BUE/BLE, good ROM, no bony abnormality  Psychiatric: blunted mood and affect, speech fluent and appropriate but limited by language, AOx3  Neurologic:  CN 2-12 grossly intact, moves all extremities in coordinated fashion    Radiological Exams on Admission: Ct Abdomen Pelvis W Contrast  Result Date: 09/09/2019 CLINICAL  DATA:  Abdominal pain and distention. EXAM: CT ABDOMEN AND PELVIS WITH CONTRAST TECHNIQUE: Multidetector CT imaging of the abdomen and pelvis was performed using the standard protocol following bolus administration of intravenous contrast. CONTRAST:  153mL OMNIPAQUE IOHEXOL 300 MG/ML  SOLN COMPARISON:  None. FINDINGS: Lower chest: Ground-glass airspace opacities are noted in the left upper lobe lingula and right middle lobe. More confluent opacity is noted in the dependent right middle lobe adjacent to the hemidiaphragm and the lower lobes. Small bilateral pleural effusions. Hepatobiliary: Morphologic changes of cirrhosis with central volume loss and liver nodularity. No discrete liver mass. There are several calcifications consistent with healed granuloma. Increased attenuation material is noted in the gallbladder neck consistent with small stones or sludge. No gallbladder wall thickening. No evidence of acute cholecystitis. No bile duct dilation. Pancreas: Unremarkable. No pancreatic ductal dilatation or surrounding inflammatory changes. Spleen: Normal in size without focal abnormality. Adrenals/Urinary Tract: Adrenal glands are unremarkable. Kidneys are normal, without renal calculi, focal lesion, or hydronephrosis. Bladder is unremarkable. Stomach/Bowel: Stomach is unremarkable. Small bowel and colon are normal in caliber. There are fluid levels noted along the small bowel. No convincing wall thickening or inflammation. Normal appendix. Vascular/Lymphatic: There are upper abdominal varices, which are prominent along the lesser curvature of the stomach contiguous with paraesophageal varices. Aortic atherosclerosis. No aneurysm. Normal enhancement of the portal, splenic and superior mesenteric veins. No enlarged lymph nodes. Reproductive: Prostate normal in size. Left inguinal canal is distended with fluid that extends into the scrotum. Other: Large amount of ascites which distends the abdomen. Diffuse  subcutaneous edema. Musculoskeletal: No fracture or acute finding.  No bone lesion. IMPRESSION: 1. Ground-glass opacities in the lung bases consistent with multifocal pneumonia. This appearance can be seen with COVID-19 pneumonia. 2. Large amount of ascites which distends the abdomen. This is due to cirrhosis with portal venous hypertension. 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 3. No visualized liver mass. 4. Small effusions and diffuse subcutaneous edema. 5. No evidence of a bowel obstruction. Small-bowel air-fluid levels is consistent with a mild adynamic ileus. Electronically Signed   By: Lajean Manes M.D.   On: 09/09/2019 15:51   Dg Chest Portable 1 View  Result Date: 09/09/2019 CLINICAL DATA:  Shortness of breath. EXAM: PORTABLE CHEST 1 VIEW COMPARISON:  None. FINDINGS: Mild cardiomegaly is noted. No pneumothorax or pleural effusion is noted. Mild bibasilar subsegmental atelectasis is noted. Right lower lobe airspace opacity is noted concerning for possible pneumonia. Old left clavicular fracture is noted. IMPRESSION: Mild bibasilar subsegmental atelectasis. Right lower lobe airspace opacity is noted concerning for possible pneumonia. Electronically Signed   By: Sabino Dick  Jr M.D.   On: 09/09/2019 12:56    EKG: Independently reviewed.  Sinus tachycardia with rate 106; nonspecific ST changes with no evidence of acute ischemia   Labs on Admission: I have personally reviewed the available labs and imaging studies at the time of the admission.  Pertinent labs:   K+ 3.3 Glucose 124 Albumin 1.9 AST 64/ALT 29/Bili 2.8 NH4 60 WBC 7.9 INR 1.6 HCV Ab positive COVID pending   Assessment/Plan Principal Problem:   Cirrhosis of liver (HCC) Active Problems:   Abdominal ascites   Shortness of breath  New-onset decompensated cirrhosis of liver with ascites, ?varices  -Patient without known PMH, does not see a physician, presenting with 1 month of progressive  abdominal distention with early satiety -CT indicates presence of ascites -Physical exam with obvious significant ascites -MELD/MELD-Na score is 16/17, with a mortality rate of 6% -He appears to have varices on CT -GI consultation has been requested -IR consult for paracentesis - tonight, if possible as the patient is likely to have significant improvement afterwards; may need serial paracentesis -Likely associated with hepatitis C  SOB -Likely associated with lung/diaphragmatic compression in the setting of severe ascites -However, CT does show GGO which may be c/w COVID-19 infection and/or multifocal PNA -POC COVID test was negative, repeat Hologic testing is pending; low suspicion for this as the cause at this time so will not continue precautions or PUI status at this time -Will continue Rocephin/Azithro q24h for now, although still fairly low suspicion of disease -Will check procalcitonin    Note: This patient has been tested and is negative for the novel coronavirus COVID-19 by POC testing; repeat Hologic testing is pending.    DVT prophylaxis:   SCDs Code Status:  Full - confirmed with patient Family Communication: None present; unable to discuss with family since none present and did not answer telephone Disposition Plan:  Home once clinically improved Consults called: GI; IR  Admission status: Admit - It is my clinical opinion that admission to INPATIENT is reasonable and necessary because of the expectation that this patient will require hospital care that crosses at least 2 midnights to treat this condition based on the medical complexity of the problems presented.  Given the aforementioned information, the predictability of an adverse outcome is felt to be significant.     Karmen Bongo MD [facility]   How to contact the [facility] Attending or Consulting provider [place] or covering provider during after hours [place], for this patient?  1. Check the care team in [facility]  and look for a) attending/consulting [facility] provider listed and b) the [facility] team listed 2. Log into [email or web address] and use [place]'s universal password to access. If you do not have the password, please contact the hospital operator. 3. Locate the [facility] provider you are looking for under [facility] and page to a number that you can be directly reached. 4. If you still have difficulty reaching the provider, please page the [facility] (Director on Call) for the Hospitalists listed on amion for assistance.   09/09/2019, 5:36 PM

## 2019-09-10 ENCOUNTER — Inpatient Hospital Stay (HOSPITAL_COMMUNITY): Payer: Medicare Other

## 2019-09-10 DIAGNOSIS — K7460 Unspecified cirrhosis of liver: Principal | ICD-10-CM

## 2019-09-10 DIAGNOSIS — E8809 Other disorders of plasma-protein metabolism, not elsewhere classified: Secondary | ICD-10-CM

## 2019-09-10 DIAGNOSIS — B182 Chronic viral hepatitis C: Secondary | ICD-10-CM

## 2019-09-10 DIAGNOSIS — E43 Unspecified severe protein-calorie malnutrition: Secondary | ICD-10-CM

## 2019-09-10 DIAGNOSIS — R188 Other ascites: Secondary | ICD-10-CM

## 2019-09-10 LAB — BASIC METABOLIC PANEL
Anion gap: 9 (ref 5–15)
BUN: 7 mg/dL — ABNORMAL LOW (ref 8–23)
CO2: 26 mmol/L (ref 22–32)
Calcium: 8.2 mg/dL — ABNORMAL LOW (ref 8.9–10.3)
Chloride: 104 mmol/L (ref 98–111)
Creatinine, Ser: 0.82 mg/dL (ref 0.61–1.24)
GFR calc Af Amer: >60 mL/min (ref >60)
GFR calc non Af Amer: >60 mL/min (ref >60)
Glucose, Bld: 117 mg/dL — ABNORMAL HIGH (ref 70–99)
Potassium: 4 mmol/L (ref 3.5–5.1)
Sodium: 139 mmol/L (ref 135–145)

## 2019-09-10 LAB — COMPREHENSIVE METABOLIC PANEL
ALT: 27 U/L (ref 0–44)
AST: 52 U/L — ABNORMAL HIGH (ref 15–41)
Albumin: 1.7 g/dL — ABNORMAL LOW (ref 3.5–5.0)
Alkaline Phosphatase: 61 U/L (ref 38–126)
Anion gap: 9 (ref 5–15)
BUN: 5 mg/dL — ABNORMAL LOW (ref 8–23)
CO2: 27 mmol/L (ref 22–32)
Calcium: 8 mg/dL — ABNORMAL LOW (ref 8.9–10.3)
Chloride: 103 mmol/L (ref 98–111)
Creatinine, Ser: 0.6 mg/dL — ABNORMAL LOW (ref 0.61–1.24)
GFR calc Af Amer: >60 mL/min (ref >60)
GFR calc non Af Amer: >60 mL/min (ref >60)
Glucose, Bld: 116 mg/dL — ABNORMAL HIGH (ref 70–99)
Potassium: 3.6 mmol/L (ref 3.5–5.1)
Sodium: 139 mmol/L (ref 135–145)
Total Bilirubin: 1.9 mg/dL — ABNORMAL HIGH (ref 0.3–1.2)
Total Protein: 7.3 g/dL (ref 6.5–8.1)

## 2019-09-10 LAB — BODY FLUID CELL COUNT WITH DIFFERENTIAL
Eos, Fluid: 0 %
Lymphs, Fluid: 72 %
Monocyte-Macrophage-Serous Fluid: 24 % — ABNORMAL LOW (ref 50–90)
Neutrophil Count, Fluid: 4 % (ref 0–25)
Total Nucleated Cell Count, Fluid: 51 cu mm (ref 0–1000)

## 2019-09-10 LAB — GRAM STAIN

## 2019-09-10 LAB — PROTEIN, PLEURAL OR PERITONEAL FLUID: Total protein, fluid: <3 g/dL

## 2019-09-10 LAB — CBC
HCT: 34.2 % — ABNORMAL LOW (ref 39.0–52.0)
Hemoglobin: 11.2 g/dL — ABNORMAL LOW (ref 13.0–17.0)
MCH: 31.7 pg (ref 26.0–34.0)
MCHC: 32.7 g/dL (ref 30.0–36.0)
MCV: 96.9 fL (ref 80.0–100.0)
Platelets: 195 10*3/uL (ref 150–400)
RBC: 3.53 MIL/uL — ABNORMAL LOW (ref 4.22–5.81)
RDW: 14.2 % (ref 11.5–15.5)
WBC: 7.4 10*3/uL (ref 4.0–10.5)
nRBC: 0 % (ref 0.0–0.2)

## 2019-09-10 LAB — ALBUMIN, PLEURAL OR PERITONEAL FLUID

## 2019-09-10 LAB — PROTIME-INR
INR: 1.6 — ABNORMAL HIGH (ref 0.8–1.2)
Prothrombin Time: 18.7 seconds — ABNORMAL HIGH (ref 11.4–15.2)

## 2019-09-10 IMAGING — US US PARACENTESIS
1 series · 5 of 5 positions shown · non-contrast
Comparison: none

INDICATION: Patient with abdominal distention, newly diagnosed cirrhosis with
ascites. Request is made for diagnostic and therapeutic
paracentesis.

[Series 1: us paracentesis · 5 of 5 slices shown]
[im 1/5]
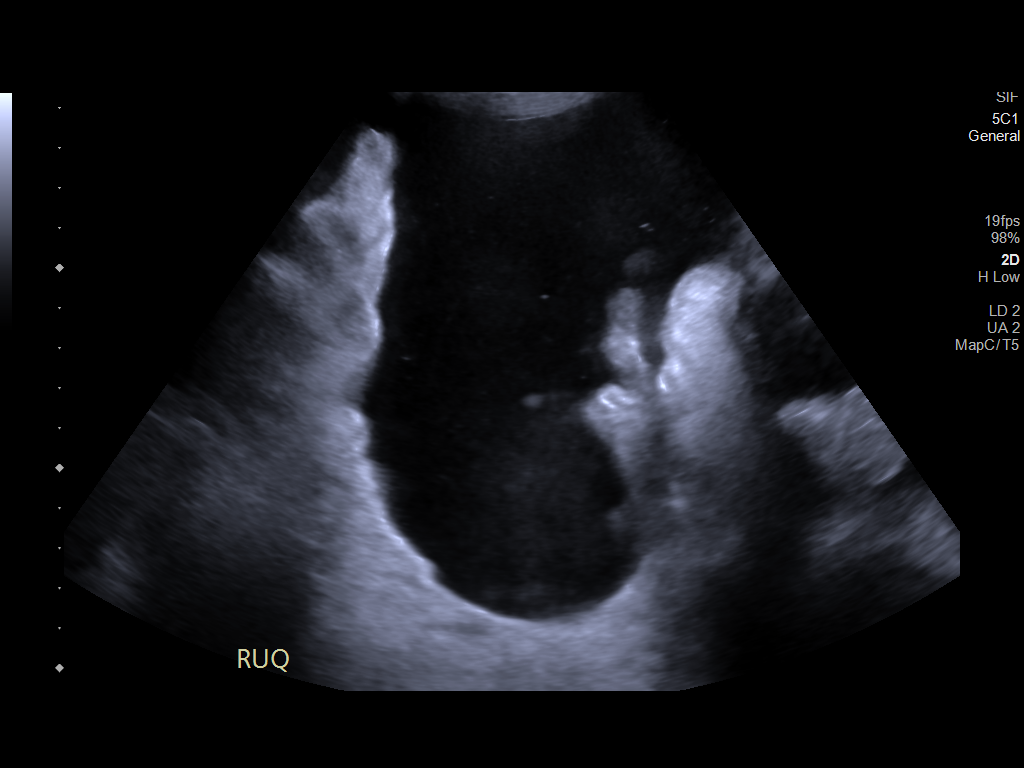
[im 2/5]
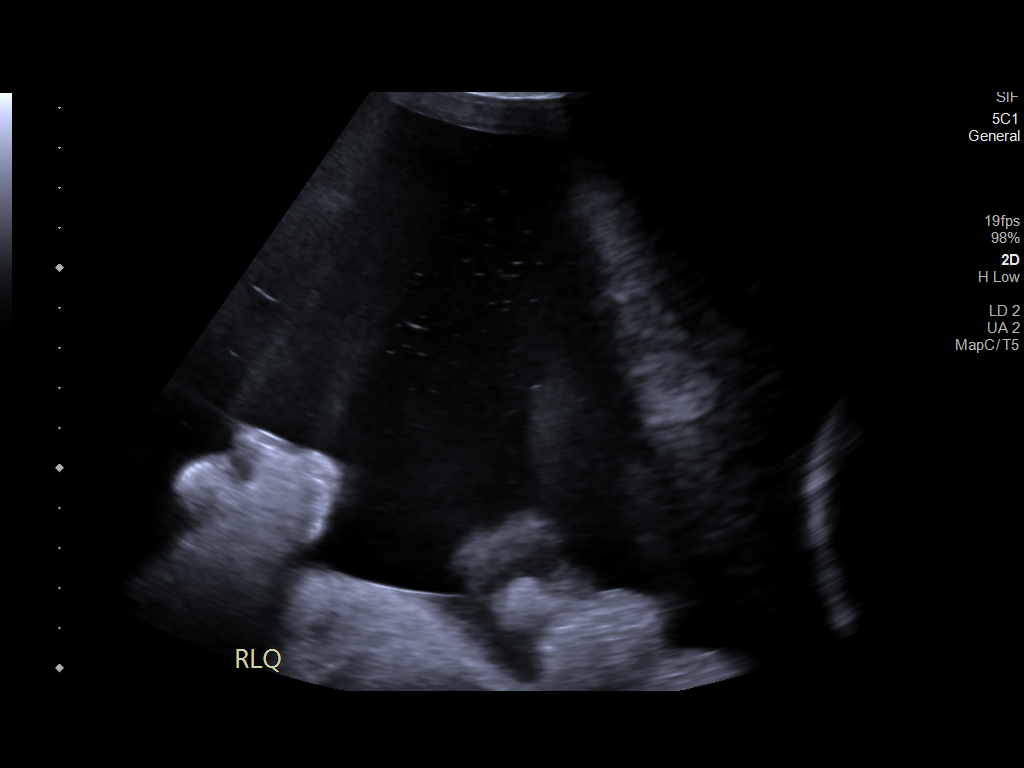
[im 3/5]
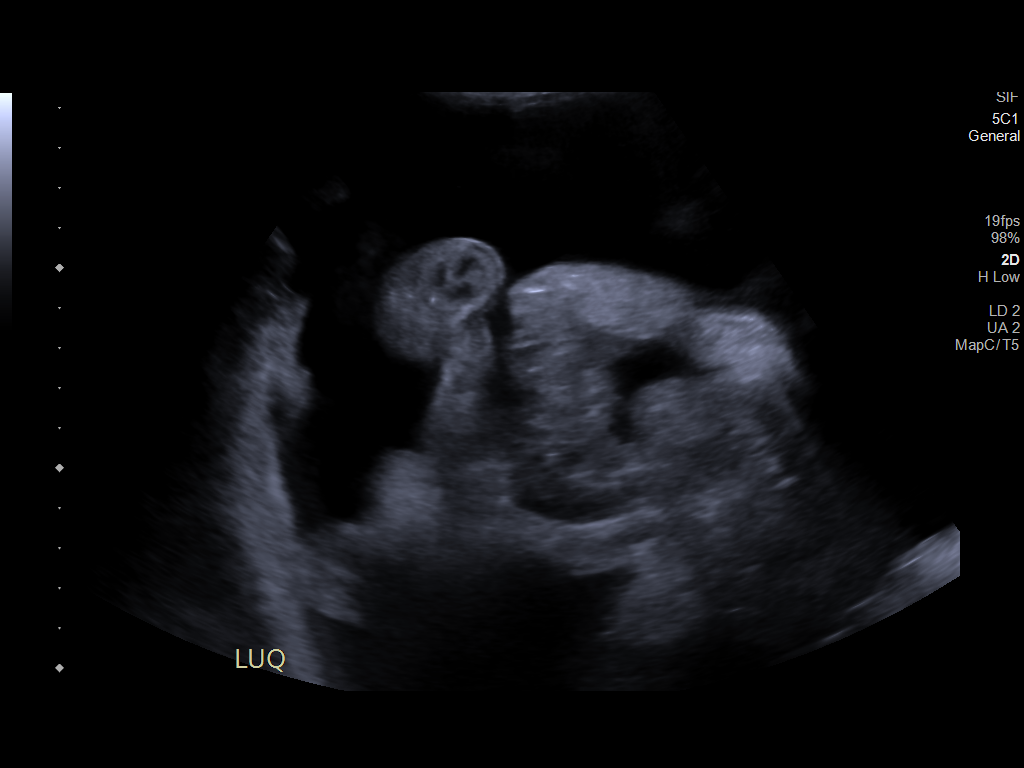
[im 4/5]
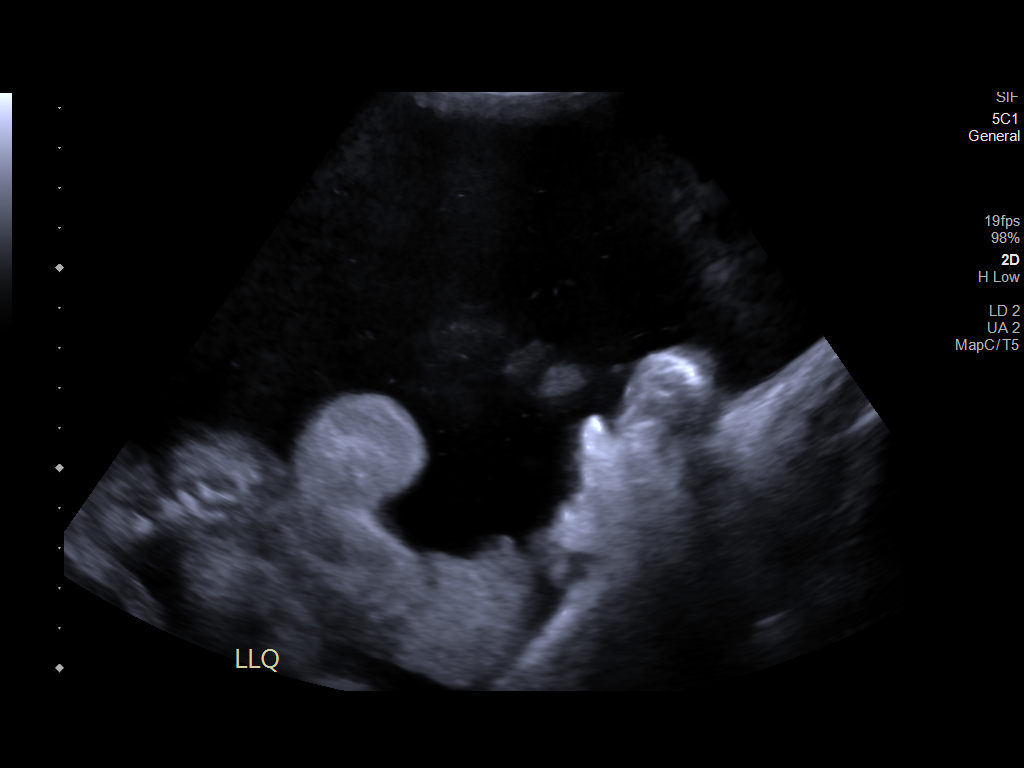
[im 5/5]
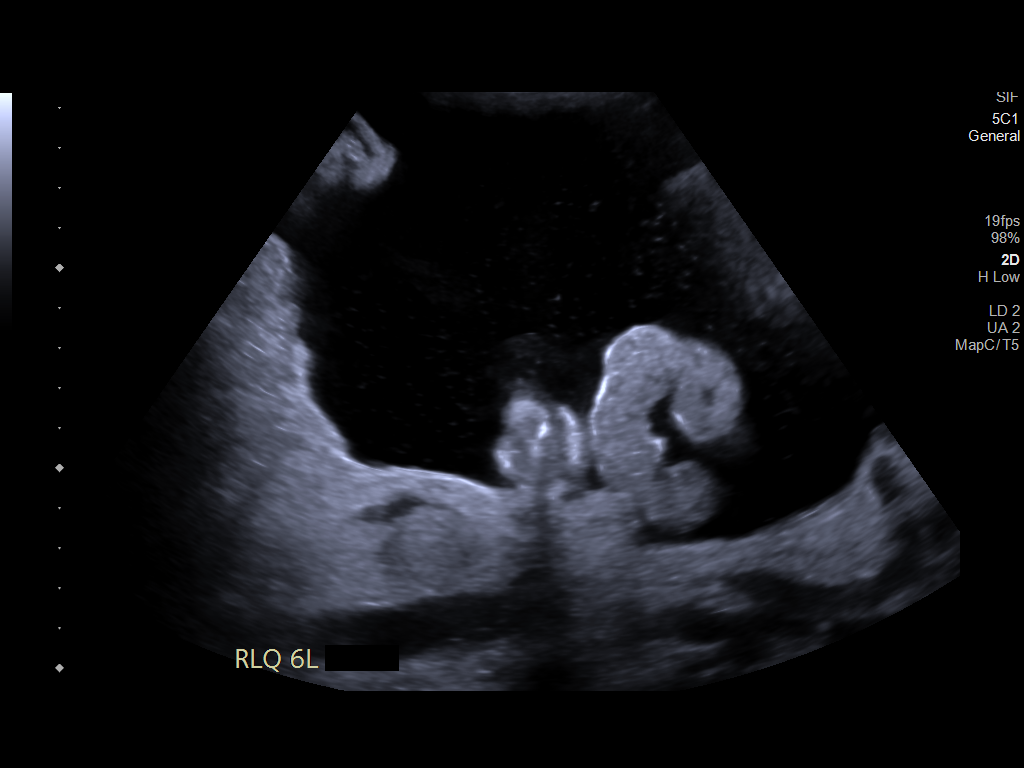

[5 of 5 positions shown; findings below may reference images not displayed]

EXAM:
ULTRASOUND GUIDED DIAGNOSTIC AND THERAPEUTIC PARACENTESIS

MEDICATIONS:
10 mL 1% lidocaine

COMPLICATIONS:
None immediate.

PROCEDURE:
Informed written consent was obtained from the patient after a
discussion of the risks, benefits and alternatives to treatment. A
timeout was performed prior to the initiation of the procedure.

Initial ultrasound scanning demonstrates a large amount of ascites
within the right lower abdominal quadrant. The right lower abdomen
was prepped and draped in the usual sterile fashion. 1% lidocaine
was used for local anesthesia.

Following this, a 19 gauge, 7-cm, Yueh catheter was introduced. An
ultrasound image was saved for documentation purposes. The
paracentesis was performed. The catheter was removed and a dressing
was applied. The patient tolerated the procedure well without
immediate post procedural complication.
FINDINGS: A total of approximately 6.0 liters of yellow fluid was removed.
Procedure was stopped prior to removal of all fluid as this was
patient's first paracentesis. Samples were sent to the laboratory as
requested by the clinical team.
IMPRESSION: Successful ultrasound-guided paracentesis yielding 6.0 liters of
peritoneal fluid.

## 2019-09-10 MED ORDER — SPIRONOLACTONE 25 MG PO TABS
50.0000 mg | ORAL_TABLET | Freq: Every day | ORAL | Status: DC
  Administered 2019-09-10: 50 mg via ORAL
  Filled 2019-09-10: qty 2

## 2019-09-10 MED ORDER — NADOLOL 20 MG PO TABS
20.0000 mg | ORAL_TABLET | Freq: Every day | ORAL | Status: DC
  Administered 2019-09-10: 20 mg via ORAL
  Filled 2019-09-10: qty 1

## 2019-09-10 MED ORDER — FUROSEMIDE 40 MG PO TABS
40.0000 mg | ORAL_TABLET | Freq: Every day | ORAL | Status: DC
  Administered 2019-09-10: 40 mg via ORAL
  Filled 2019-09-10: qty 1

## 2019-09-10 MED ORDER — LIDOCAINE HCL (PF) 1 % IJ SOLN
INTRAMUSCULAR | Status: AC
  Filled 2019-09-10: qty 30

## 2019-09-10 NOTE — Consult Note (Addendum)
Consultation  Referring Provider: [facility]/ Izetta Dakin MD Primary Care Physician:  Patient, No Pcp Per Primary Gastroenterologist:  None unassigned  Reason for Consultation: New diagnosis decompensated cirrhosis  HPI: Donald Aguilar is a 68 y.o. [name]  male, who was admitted yesterday through the emergency room where he presented with complaints of abdominal discomfort and distention over the past month.  Had also complained of fatigue and some shortness of breath with exertion. Work-up with CT of the abdomen and pelvis showed groundglass opacities in both lower lung fields consistent with multi focal pneumonia, small bilateral effusions, a cirrhotic appearing liver without mass, small gallstones, large amount of ascites and upper abdominal varices. Labs have shown WBC 7.9, hemoglobin 13.2, platelets 195, INR 1.6, ammonia 60 Creatinine 0.8 Albumin 1.9, T bili 2.8 LFTs otherwise within normal limits. Covid tested x2 secondary to pneumonia both negative. Hepatitis C antibody is positive hep B surface antigen negative  Patient underwent large-volume paracentesis this morning with removal of 6 L-cell counts etc. are pending.  I saw this patient with [place] interpreter, and a nurse tech working on the floor who speaks a similar dialect to [name]. He denies any abdominal pain currently says abdomen feels better after paracentesis.  He is not aware of any prior diagnosis of liver disease, no prior hepatitis that he is aware of.  He used to drink some alcohol though not ever heavily and has not had any alcohol in the past 3 to 4 years.  He has been in the [place] since 1990s.  Otherwise in generally good health.   Past Medical History:  Diagnosis Date  . 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  Prior to Admission medications   Not on File    Current Facility-Administered Medications  Medication Dose Route Frequency Provider Last Rate  Last Dose  . acetaminophen (TYLENOL) tablet 650 mg  650 mg Oral Q6H PRN Karmen Bongo, MD       Or  . acetaminophen (TYLENOL) suppository 650 mg  650 mg Rectal Q6H PRN Karmen Bongo, MD      . azithromycin (ZITHROMAX) 500 mg in sodium chloride 0.9 % 250 mL IVPB  500 mg Intravenous Q24H Karmen Bongo, MD      . cefTRIAXone (ROCEPHIN) 1 g in sodium chloride 0.9 % 100 mL IVPB  1 g Intravenous Q24H Karmen Bongo, MD      . docusate sodium (COLACE) capsule 100 mg  100 mg Oral BID Karmen Bongo, MD   100 mg at 09/09/19 2128  . lidocaine (PF) (XYLOCAINE) 1 % injection           . ondansetron (ZOFRAN) tablet 4 mg  4 mg Oral Q6H PRN Karmen Bongo, MD       Or  . ondansetron [facility]) injection 4 mg  4 mg Intravenous Q6H PRN Karmen Bongo, MD        Allergies as of 09/09/2019  . (No Known Allergies)    No family history on file.  Social History   Socioeconomic History  . Marital status: Married    Spouse name: Not on file  . Number of children: Not on file  . Years of education: Not on file  . Highest education level: Not on file  Occupational History  . Not on file  Social Needs  . Financial resource strain: Not on file  . Food insecurity    Worry: Not on file    Inability:  Not on file  . Transportation needs    Medical: Not on file    Non-medical: Not on file  Tobacco Use  . Smoking status: Current Every Day Smoker  Substance and Sexual Activity  . Alcohol use: Yes  . Drug use: Not on file  . Sexual activity: Not on file  Lifestyle  . Physical activity    Days per week: Not on file    Minutes per session: Not on file  . Stress: Not on file  Relationships  . Social Herbalist on phone: Not on file    Gets together: Not on file    Attends religious service: Not on file    Active member of club or organization: Not on file    Attends meetings of clubs or organizations: Not on file    Relationship status: Not on file  . Intimate partner violence     Fear of current or ex partner: Not on file    Emotionally abused: Not on file    Physically abused: Not on file    Forced sexual activity: Not on file  Other Topics Concern  . Not on file  Social History Narrative  . Not on file    Review of Systems: Pertinent positive and negative review of systems were noted in the above HPI section.  All other review of systems was otherwise negative.  Physical Exam: Vital signs in last 24 hours: Temp:  [97.3 F (36.3 C)-98.1 F (36.7 C)] 97.3 F (36.3 C) (11/21 0427) Pulse Rate:  [99-111] 99 (11/21 0427) Resp:  [15-24] 20 (11/21 0427) BP: (106-138)/(77-102) 110/84 (11/21 0427) SpO2:  [90 %-99 %] 93 % (11/21 0427) Weight:  [68.6 kg] 68.6 kg (11/21 0023)   General:   Alert,  Well-developed, well-nourished, very thin older Asian male  pleasant and cooperative in NAD Head:  Normocephalic and atraumatic. Eyes:  Sclera clear, no icterus.   Conjunctiva pink. Ears:  Normal auditory acuity. Nose:  No deformity, discharge,  or lesions. Mouth:  No deformity or lesions.   Neck:  Supple; no masses or thyromegaly. Lungs: Few rhonchi bilateral bases . Heart:  Regular rate and rhythm; no murmurs, clicks, rubs,  or gallops. Abdomen:  Soft, positive fluid wave, nontense ascites, nontender no palpable mass.  Bowel sounds present, umbilical hernia Rectal:  Deferred  Msk:  Symmetrical without gross deformities. . Pulses:  Normal pulses noted. Extremities:  Without clubbing or edema. Neurologic:  Alert and  oriented x4;  grossly normal neurologically.  No asterixis Skin:  Intact without significant lesions or rashes.. Psych:  Alert and cooperative. Normal mood and affect.  Intake/Output from previous day: 11/20 0701 - 11/21 0700 In: 250 [IV Piggyback:250] Out: 0  Intake/Output this shift: No intake/output data recorded.  Lab Results: Recent Labs    09/09/19 1115 09/10/19 0223  WBC 7.9 7.4  HGB 13.2 11.2*  HCT 40.8 34.2*  PLT 222 195   BMET  Recent Labs    09/09/19 1115 09/10/19 0223  NA 139 139  K 3.3* 3.6  CL 102 103  CO2 26 27  GLUCOSE 124* 116*  BUN 6* 5*  CREATININE 0.82 0.60*  CALCIUM 8.4* 8.0*   LFT Recent Labs    09/10/19 0223  PROT 7.3  ALBUMIN 1.7*  AST 52*  ALT 27  ALKPHOS 61  BILITOT 1.9*   PT/INR Recent Labs    09/09/19 1233 09/10/19 0223  LABPROT 18.5* 18.7*  INR 1.6* 1.6*   Hepatitis  Panel Recent Labs    09/09/19 1233  HEPBSAG NON REACTIVE  HCVAB Reactive*  HEPAIGM NON REACTIVE  HEPBIGM NON REACTIVE      IMPRESSION:   #69 68 year old Vietnamese/[name] male with new diagnosis of decompensated cirrhosis with large amount of ascites, and CT showing upper abdominal varices.  Patient presented with new onset of abdominal distention over the past month and discomfort.  Hepatitis C antibody is positive-suspect chronic hep C as etiology of liver disease.  #2 coagulopathy secondary to above #3 significant hypoalbuminemia #4 multifocal pneumonia #5 gallstones  Plan; institute 2 g sodium diet, and patient will need to be instructed on 2 g sodium diet for discharge.  I discussed this with him today.  Await cell counts on peritoneal fluid Check hepatitis C PCR RNA quant AFP Start Lasix 40 mg p.o. daily and Aldactone 50 mg p.o. daily. Start Corgard 20 mg p.o. daily Vitamin K subcu daily x3  On Rocephin for pneumonia Thank you, we will follow     Amy EsterwoodPA-C  09/10/2019, 10:17 AM  I have reviewed the entire case in detail with the above APP and discussed the plan in detail.  Therefore, I agree with the diagnoses recorded above. In addition,  I have personally interviewed and examined the patient and have personally reviewed any abdominal/pelvic CT scan images.  My additional thoughts are as follows:  New diagnosis of decompensated end-stage liver disease, most likely from previously undiagnosed chronic hepatitis C infection.  Massive volume overload,  now somewhat  relieved after 6 L paracentesis today.  No SBP on cell count, somehow could not calculate the albumin due to some technical problem, but this is suspected to be portal hypertensive in nature.  Cytology and AFB and Gram stain are still pending.  Patient indigenous to [place] and also has pulmonary findings, raising some concern for TB. He is currently on antibiotics for community-acquired pneumonia based on CT findings and reports cough and dyspnea.  He still has quite a lot of ascites on exam and is cachectic with profound hypoalbuminemia.   I have changed the plan somewhat from that noted above.: Added hepatitis C genotype to the PCR Discontinue diuretics because I want to be sure his renal function tolerates this initial paracentesis. Line paracentesis a day after tomorrow.  I discontinued the Corgard be sure his renal function and blood pressure tolerate the paracentesis.  We will follow. Nelida Meuse III [phone]

## 2019-09-10 NOTE — Progress Notes (Signed)
Brief Narrative:  HPI: Donald Aguilar is a 68 y.o. male without known medical history presenting with abdominal pain/distention.  He reports 1 month h/o abdominal distention with increasing SOB and intermittent cough.  +early satiety.  No fever.  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  [name] here for a month.  Worsening abdominal distention, now with SOB.  Abdominal distension.  Bili 2.8.  CT with significant cirrhosis, ?multifocal PNA, ?COVID tested negative. From new-onset cirrhosis. GI consulted.   Subjective: Patient says he was still having abdominal discomfort.  He was going for paracentesis this morning.  Denies any nausea vomiting but abdominal discomfort.  Objective: Vital signs in last 24 hours: Temp:  [97.3 F (36.3 C)-98.7 F (37.1 C)] 98.7 F (37.1 C) (11/21 1414) Pulse Rate:  [60-111] 60 (11/21 1414) Resp:  [18-23] 19 (11/21 1414) BP: (93-138)/(63-102) 101/63 (11/21 1414) SpO2:  [90 %-99 %] 96 % (11/21 1414) Weight:  [68.6 kg] 68.6 kg (11/21 0023)  Intake/Output from previous day: 11/20 0701 - 11/21 0700 In: 250  Out: 0  Intake/Output this shift: Total I/O In: 300 [P.O.:300] Out: -    General:  Appears calm and comfortable and is NAD  Eyes:  PERRL, EOMI, normal lids, iris  ENT:  grossly normal hearing, lips & tongue, mmm  Neck:  no LAD, masses or thyromegaly  Cardiovascular:  RR with mild tachycardia, no m/r/g. No LE edema.   Respiratory:   Scattered rhonchi.  Normal respiratory effort.  Abdomen:  severe distention, tympanic sound  Skin:  no rash or induration seen on limited exam  Musculoskeletal:  grossly normal tone BUE/BLE, good ROM, no bony abnormality  Psychiatric: blunted mood and affect, speech fluent and appropriate but limited by language, AOx3  Neurologic:  CN 2-12 grossly intact, moves all extremities in coordinated fashion    Results for orders placed or performed during the hospital encounter of 09/09/19 (from the  past 24 hour(s))  Lactic acid, plasma     Status: None   Collection Time: 09/09/19  4:12 PM  Result Value Ref Range   Lactic Acid, Venous 1.5 0.5 - 1.9 mmol/L  Blood culture (routine x 2)     Status: None (Preliminary result)   Collection Time: 09/09/19  5:07 PM   Specimen: BLOOD RIGHT HAND  Result Value Ref Range   Specimen Description BLOOD RIGHT HAND    Special Requests      BOTTLES DRAWN AEROBIC AND ANAEROBIC Blood Culture results may not be optimal due to an inadequate volume of blood received in culture bottles   Culture      NO GROWTH < 24 HOURS Performed at [facility], [place] [address], [place], [place] [address]    Report Status PENDING   Blood culture (routine x 2)     Status: None (Preliminary result)   Collection Time: 09/09/19  5:07 PM   Specimen: BLOOD  Result Value Ref Range   Specimen Description BLOOD RIGHT ANTECUBITAL    Special Requests      BOTTLES DRAWN AEROBIC AND ANAEROBIC Blood Culture adequate volume   Culture      NO GROWTH < 24 HOURS Performed at [facility], [place] [address] [place], [place] [address]    Report Status PENDING   POC SARS Coronavirus 2 Ag-ED - Nasal Swab (BD Veritor Kit)     Status: None   Collection Time: 09/09/19  5:15 PM  Result Value Ref Range   SARS Coronavirus  2 Ag NEGATIVE NEGATIVE  Procalcitonin - Baseline     Status: None   Collection Time: 09/09/19  5:44 PM  Result Value Ref Range   Procalcitonin <0.10 ng/mL  SARS CORONAVIRUS 2 (TAT 6-24 HRS) Nasopharyngeal Nasopharyngeal Swab     Status: None   Collection Time: 09/09/19  6:16 PM   Specimen: Nasopharyngeal Swab  Result Value Ref Range   SARS Coronavirus 2 NEGATIVE NEGATIVE  Lactic acid, plasma     Status: None   Collection Time: 09/09/19  6:16 PM  Result Value Ref Range   Lactic Acid, Venous 1.4 0.5 - 1.9 mmol/L  HIV Antibody (routine testing w rflx)     Status: None   Collection Time: 09/09/19  6:16 PM  Result Value Ref Range   HIV Screen 4th Generation  wRfx NON REACTIVE NON REACTIVE  Urinalysis, Routine w reflex microscopic     Status: Abnormal   Collection Time: 09/09/19  8:03 PM  Result Value Ref Range   Color, Urine AMBER (A) YELLOW   APPearance HAZY (A) CLEAR   Specific Gravity, Urine >1.046 (H) 1.005 - 1.030   pH 6.0 5.0 - 8.0   Glucose, UA NEGATIVE NEGATIVE mg/dL   Hgb urine dipstick NEGATIVE NEGATIVE   Bilirubin Urine NEGATIVE NEGATIVE   Ketones, ur NEGATIVE NEGATIVE mg/dL   Protein, ur 30 (A) NEGATIVE mg/dL   Nitrite NEGATIVE NEGATIVE   Leukocytes,Ua NEGATIVE NEGATIVE   RBC / HPF 0-5 0 - 5 RBC/hpf   WBC, UA 0-5 0 - 5 WBC/hpf   Bacteria, UA MANY (A) NONE SEEN   Squamous Epithelial / LPF 6-10 0 - 5   Mucus PRESENT   CBC     Status: Abnormal   Collection Time: 09/10/19  2:23 AM  Result Value Ref Range   WBC 7.4 4.0 - 10.5 K/uL   RBC 3.53 (L) 4.22 - 5.81 MIL/uL   Hemoglobin 11.2 (L) 13.0 - 17.0 g/dL   HCT 34.2 (L) 39.0 - 52.0 %   MCV 96.9 80.0 - 100.0 fL   MCH 31.7 26.0 - 34.0 pg   MCHC 32.7 30.0 - 36.0 g/dL   RDW 14.2 11.5 - 15.5 %   Platelets 195 150 - 400 K/uL   nRBC 0.0 0.0 - 0.2 %  Comprehensive metabolic panel     Status: Abnormal   Collection Time: 09/10/19  2:23 AM  Result Value Ref Range   Sodium 139 135 - 145 mmol/L   Potassium 3.6 3.5 - 5.1 mmol/L   Chloride 103 98 - 111 mmol/L   CO2 27 22 - 32 mmol/L   Glucose, Bld 116 (H) 70 - 99 mg/dL   BUN 5 (L) 8 - 23 mg/dL   Creatinine, Ser 0.60 (L) 0.61 - 1.24 mg/dL   Calcium 8.0 (L) 8.9 - 10.3 mg/dL   Total Protein 7.3 6.5 - 8.1 g/dL   Albumin 1.7 (L) 3.5 - 5.0 g/dL   AST 52 (H) 15 - 41 U/L   ALT 27 0 - 44 U/L   Alkaline Phosphatase 61 38 - 126 U/L   Total Bilirubin 1.9 (H) 0.3 - 1.2 mg/dL   GFR calc non Af Amer >60 >60 mL/min   GFR calc Af Amer >60 >60 mL/min   Anion gap 9 5 - 15  Protime-INR     Status: Abnormal   Collection Time: 09/10/19  2:23 AM  Result Value Ref Range   Prothrombin Time 18.7 (H) 11.4 - 15.2 seconds   INR 1.6 (H)  0.8 - 1.2  Body  fluid cell count with differential     Status: Abnormal   Collection Time: 09/10/19 10:39 AM  Result Value Ref Range   Fluid Type-FCT CYTO PERI    Color, Fluid YELLOW YELLOW   Appearance, Fluid CLEAR CLEAR   Total Nucleated Cell Count, Fluid 51 0 - 1,000 cu mm   Neutrophil Count, Fluid 4 0 - 25 %   Lymphs, Fluid 72 %   Monocyte-Macrophage-Serous Fluid 24 (L) 50 - 90 %   Eos, Fluid 0 %   Other Cells, Fluid few mesothelial cells present %  Albumin, pleural or peritoneal fluid     Status: None   Collection Time: 09/10/19 10:39 AM  Result Value Ref Range   Albumin, Fluid RESULTS UNAVAILABLE DUE TO INTERFERING SUBSTANCE g/dL   Fluid Type-FALB CYTO PERI   Protein, pleural or peritoneal fluid     Status: None   Collection Time: 09/10/19 10:39 AM  Result Value Ref Range   Total protein, fluid <3.0 g/dL   Fluid Type-FTP CYTO PERI   Basic metabolic panel daily start today     Status: Abnormal   Collection Time: 09/10/19 10:58 AM  Result Value Ref Range   Sodium 139 135 - 145 mmol/L   Potassium 4.0 3.5 - 5.1 mmol/L   Chloride 104 98 - 111 mmol/L   CO2 26 22 - 32 mmol/L   Glucose, Bld 117 (H) 70 - 99 mg/dL   BUN 7 (L) 8 - 23 mg/dL   Creatinine, Ser 0.82 0.61 - 1.24 mg/dL   Calcium 8.2 (L) 8.9 - 10.3 mg/dL   GFR calc non Af Amer >60 >60 mL/min   GFR calc Af Amer >60 >60 mL/min   Anion gap 9 5 - 15    Studies/Results: Ct Abdomen Pelvis W Contrast  Result Date: 09/09/2019 CLINICAL DATA:  Abdominal pain and distention. EXAM: CT ABDOMEN AND PELVIS WITH CONTRAST TECHNIQUE: Multidetector CT imaging of the abdomen and pelvis was performed using the standard protocol following bolus administration of intravenous contrast. CONTRAST:  178m OMNIPAQUE IOHEXOL 300 MG/ML  SOLN COMPARISON:  None. FINDINGS: Lower chest: Ground-glass airspace opacities are noted in the left upper lobe lingula and right middle lobe. More confluent opacity is noted in the dependent right middle lobe adjacent to the  hemidiaphragm and the lower lobes. Small bilateral pleural effusions. Hepatobiliary: Morphologic changes of cirrhosis with central volume loss and liver nodularity. No discrete liver mass. There are several calcifications consistent with healed granuloma. Increased attenuation material is noted in the gallbladder neck consistent with small stones or sludge. No gallbladder wall thickening. No evidence of acute cholecystitis. No bile duct dilation. Pancreas: Unremarkable. No pancreatic ductal dilatation or surrounding inflammatory changes. Spleen: Normal in size without focal abnormality. Adrenals/Urinary Tract: Adrenal glands are unremarkable. Kidneys are normal, without renal calculi, focal lesion, or hydronephrosis. Bladder is unremarkable. Stomach/Bowel: Stomach is unremarkable. Small bowel and colon are normal in caliber. There are fluid levels noted along the small bowel. No convincing wall thickening or inflammation. Normal appendix. Vascular/Lymphatic: There are upper abdominal varices, which are prominent along the lesser curvature of the stomach contiguous with paraesophageal varices. Aortic atherosclerosis. No aneurysm. Normal enhancement of the portal, splenic and superior mesenteric veins. No enlarged lymph nodes. Reproductive: Prostate normal in size. Left inguinal canal is distended with fluid that extends into the scrotum. Other: Large amount of ascites which distends the abdomen. Diffuse subcutaneous edema. Musculoskeletal: No fracture or acute finding.  No bone lesion.  IMPRESSION: 1. Ground-glass opacities in the lung bases consistent with multifocal pneumonia. This appearance can be seen with COVID-19 pneumonia. 2. Large amount of ascites which distends the abdomen. This is due to cirrhosis with portal venous hypertension. 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 3. No visualized liver mass. 4. Small effusions and diffuse subcutaneous edema. 5. No evidence of a bowel  obstruction. Small-bowel air-fluid levels is consistent with a mild adynamic ileus. Electronically Signed   By: Lajean Manes M.D.   On: 09/09/2019 15:51   US Paracentesis  Result Date: 09/10/2019 INDICATION: Patient with abdominal distention, newly diagnosed cirrhosis with ascites. Request is made for diagnostic and therapeutic paracentesis. EXAM: ULTRASOUND GUIDED DIAGNOSTIC AND THERAPEUTIC PARACENTESIS MEDICATIONS: 10 mL 1% lidocaine COMPLICATIONS: None immediate. PROCEDURE: Informed written consent was obtained from the patient after a discussion of the risks, benefits and alternatives to treatment. A timeout was performed prior to the initiation of the procedure. Initial ultrasound scanning demonstrates a large amount of ascites within the right lower abdominal quadrant. The right lower abdomen was prepped and draped in the usual sterile fashion. 1% lidocaine was used for local anesthesia. Following this, a 19 gauge, 7-cm, Yueh catheter was introduced. An ultrasound image was saved for documentation purposes. The paracentesis was performed. The catheter was removed and a dressing was applied. The patient tolerated the procedure well without immediate post procedural complication. FINDINGS: A total of approximately 6.0 liters of yellow fluid was removed. Procedure was stopped prior to removal of all fluid as this was patient's first paracentesis. Samples were sent to the laboratory as requested by the clinical team. IMPRESSION: Successful ultrasound-guided paracentesis yielding 6.0 liters of peritoneal fluid. Read by: Brynda Greathouse [name] Electronically Signed   By: Jerilynn Mages.  Shick M.D.   On: 09/10/2019 10:32   Dg Chest Portable 1 View  Result Date: 09/09/2019 CLINICAL DATA:  Shortness of breath. EXAM: PORTABLE CHEST 1 VIEW COMPARISON:  None. FINDINGS: Mild cardiomegaly is noted. No pneumothorax or pleural effusion is noted. Mild bibasilar subsegmental atelectasis is noted. Right lower lobe airspace opacity  is noted concerning for possible pneumonia. Old left clavicular fracture is noted. IMPRESSION: Mild bibasilar subsegmental atelectasis. Right lower lobe airspace opacity is noted concerning for possible pneumonia. Electronically Signed   By: Marijo Conception M.D.   On: 09/09/2019 12:56    Scheduled Meds: . docusate sodium  100 mg Oral BID  . furosemide  40 mg Oral Daily  . lidocaine (PF)      . nadolol  20 mg Oral Daily  . spironolactone  50 mg Oral Daily   Continuous Infusions: . azithromycin (ZITHROMAX) 500 MG IVPB (Vial-Mate [employer])    . cefTRIAXone (ROCEPHIN)  IV     PRN Meds:acetaminophen **OR** acetaminophen, ondansetron **OR** ondansetron (ZOFRAN) IV  Assessment/Plan: Principal Problem:   Cirrhosis of liver (HCC) Active Problems:   Abdominal ascites   Shortness of breath  New-onset decompensated cirrhosis of liver with ascites, ?varices -Status post paracentesis  of 6 L yellow fluid this morning by interventional radiology. GI consulted. Recs: institute 2 g sodium diet, and patient will need to be instructed on 2 g sodium diet for discharge.  I discussed this with him today. Await cell counts on peritoneal fluid. Check hepatitis C PCR RNA quant. AFP, Start Lasix 40 mg p.o. daily and Aldactone 50 mg p.o. daily. Start Corgard 20 mg p.o. daily, Vitamin K subcu daily x3 - Greatly appreciate IR and GI recs  -Patient without known PMH, does not  see a physician, presenting with 1 month of progressive abdominal distention with early satiety -CT indicates presence of ascites; paracentesis fluid analysis per GI -MELD/MELD-Na score is 16/17, with a mortality rate of 6% -He appears to have varices on CT - may need serial paracentesis -Likely associated with hepatitis C  SOB -Improving slowly  - likely associated with lung/diaphragmatic compression in the setting of severe ascites -However, CT does show GGO which may be c/w COVID-19 infection and/or multifocal PNA - Covid tested  negative this time  -- Hologic testing is Negative  -Will continue Rocephin/Azithro q24h for now, although still fairly low suspicion of disease - Negative procalcitonin   SCD's    LOS: 1 day   [employer]

## 2019-09-10 NOTE — Procedures (Signed)
PROCEDURE SUMMARY:  Successful US guided paracentesis from right lateral abdomen.  Yielded 6.0 liters of yellow fluid.  No immediate complications.  Pt tolerated well.   Specimen was sent for labs.  EBL < 31mL  Docia Barrier [name] 09/10/2019 10:18 AM

## 2019-09-10 NOTE — Plan of Care (Signed)
  Problem: Safety: Goal: Ability to remain free from injury will improve Outcome: Progressing   Problem: Skin Integrity: Goal: Risk for impaired skin integrity will decrease Outcome: Progressing   

## 2019-09-11 DIAGNOSIS — R7989 Other specified abnormal findings of blood chemistry: Secondary | ICD-10-CM

## 2019-09-11 LAB — BASIC METABOLIC PANEL
Anion gap: 7 (ref 5–15)
BUN: 8 mg/dL (ref 8–23)
CO2: 27 mmol/L (ref 22–32)
Calcium: 7.7 mg/dL — ABNORMAL LOW (ref 8.9–10.3)
Chloride: 103 mmol/L (ref 98–111)
Creatinine, Ser: 0.59 mg/dL — ABNORMAL LOW (ref 0.61–1.24)
GFR calc Af Amer: >60 mL/min (ref >60)
GFR calc non Af Amer: >60 mL/min (ref >60)
Glucose, Bld: 98 mg/dL (ref 70–99)
Potassium: 3.4 mmol/L — ABNORMAL LOW (ref 3.5–5.1)
Sodium: 137 mmol/L (ref 135–145)

## 2019-09-11 LAB — URINE CULTURE: Culture: <10000 — AB

## 2019-09-11 LAB — MAGNESIUM: Magnesium: 1.6 mg/dL — ABNORMAL LOW (ref 1.7–2.4)

## 2019-09-11 LAB — AFP TUMOR MARKER: AFP, Serum, Tumor Marker: 3.3 ng/mL (ref 0.0–8.3)

## 2019-09-11 MED ORDER — POTASSIUM CHLORIDE 10 MEQ/100ML IV SOLN: 10.0000 meq | INTRAVENOUS | Status: DC

## 2019-09-11 MED ORDER — POTASSIUM CHLORIDE CRYS ER 20 MEQ PO TBCR
40.0000 meq | EXTENDED_RELEASE_TABLET | Freq: Once | ORAL | Status: AC
  Administered 2019-09-11: 40 meq via ORAL
  Filled 2019-09-11: qty 2

## 2019-09-11 MED ORDER — POTASSIUM CITRATE ER 10 MEQ (1080 MG) PO TBCR
40.0000 meq | EXTENDED_RELEASE_TABLET | Freq: Three times a day (TID) | ORAL | Status: DC
  Filled 2019-09-11: qty 4

## 2019-09-11 MED ORDER — ALBUMIN HUMAN 25 % IV SOLN
40.0000 g | Freq: Once | INTRAVENOUS | Status: AC
  Administered 2019-09-11: 40 g via INTRAVENOUS
  Filled 2019-09-11: qty 200
  Filled 2019-09-11: qty 160

## 2019-09-11 NOTE — Progress Notes (Signed)
[facility] Progress Note  Chief Complaint: Decompensated cirrhosis with massive ascites  History:  Patient seems to feel about the same today.  He still seems to indicate abdominal discomfort. I was unable to arrange for a month and yard interpreter for him today. ROS: Unable to obtain  Objective:   Current Facility-Administered Medications:  .  acetaminophen (TYLENOL) tablet 650 mg, 650 mg, Oral, Q6H PRN, 650 mg at 09/11/19 0733 **OR** acetaminophen (TYLENOL) suppository 650 mg, 650 mg, Rectal, Q6H PRN, Karmen Bongo, MD .  azithromycin (ZITHROMAX) 500 mg in sodium chloride 0.9 % 250 mL IVPB, 500 mg, Intravenous, Q24H, Karmen Bongo, MD, Stopped at 09/10/19 1911 .  cefTRIAXone (ROCEPHIN) 1 g in sodium chloride 0.9 % 100 mL IVPB, 1 g, Intravenous, Q24H, Karmen Bongo, MD, Last Rate: 200 mL/hr at 09/10/19 1707, 1 g at 09/10/19 1707 .  docusate sodium (COLACE) capsule 100 mg, 100 mg, Oral, BID, Karmen Bongo, MD, 100 mg at 09/11/19 0859 .  ondansetron (ZOFRAN) tablet 4 mg, 4 mg, Oral, Q6H PRN **OR** ondansetron (ZOFRAN) injection 4 mg, 4 mg, Intravenous, Q6H PRN, Karmen Bongo, MD  . azithromycin (ZITHROMAX) 500 MG IVPB (Vial-Mate [employer]) Stopped (09/10/19 1911)  . cefTRIAXone (ROCEPHIN)  IV 1 g (09/10/19 1707)     Vital signs in last 24 hrs: Vitals:   09/11/19 0455 09/11/19 1420  BP: (!) 81/57 101/77  Pulse: 72 70  Resp: 18 20  Temp: (!) 97.5 F (36.4 C) 98.1 F (36.7 C)  SpO2: 93% 97%    Intake/Output Summary (Last 24 hours) at 09/11/2019 1733 Last data filed at 09/11/2019 0900 Gross per 24 hour  Intake 590 ml  Output 450 ml  Net 140 ml     Physical Exam  As before, chronically ill and cachectic temporal wasting  HEENT: sclera anicteric, oral mucosa without lesions  Cardiac: RRR without murmurs, S1S2 heard, no peripheral edema  Pulm: clear to auscultation bilaterally, fair inspiratory effort  Abdomen: soft, large volume ascites, no focal tenderness,  with active bowel sounds.   Skin; warm and dry, no jaundice  Recent Labs:  CBC Latest Ref Rng & Units 09/10/2019 09/09/2019  WBC 4.0 - 10.5 K/uL 7.4 7.9  Hemoglobin 13.0 - 17.0 g/dL 11.2(L) 13.2  Hematocrit 39.0 - 52.0 % 34.2(L) 40.8  Platelets 150 - 400 K/uL 195 222    Recent Labs  Lab 09/10/19 0223  INR 1.6*   CMP Latest Ref Rng & Units 09/11/2019 09/10/2019 09/10/2019  Glucose 70 - 99 mg/dL 98 117(H) 116(H)  BUN 8 - 23 mg/dL 8 7(L) 5(L)  Creatinine 0.61 - 1.24 mg/dL 0.59(L) 0.82 0.60(L)  Sodium 135 - 145 mmol/L 137 139 139  Potassium 3.5 - 5.1 mmol/L 3.4(L) 4.0 3.6  Chloride 98 - 111 mmol/L 103 104 103  CO2 22 - 32 mmol/L 27 26 27   Calcium 8.9 - 10.3 mg/dL 7.7(L) 8.2(L) 8.0(L)  Total Protein 6.5 - 8.1 g/dL - - 7.3  Total Bilirubin 0.3 - 1.2 mg/dL - - 1.9(H)  Alkaline Phos 38 - 126 U/L - - 61  AST 15 - 41 U/L - - 52(H)  ALT 0 - 44 U/L - - 27     @ASSESSMENTPLANBEGIN @ Assessment: New diagnosis of decompensated cirrhosis with positive hepatitis C antibody.  Hepatitis C viral load and genotype are pending to confirm chronic infection. Massive ascites Severe hypoalbuminemia and protein calorie malnutrition Elevated LFTs from cirrhosis  Plan: I have ordered another 5 L paracentesis for him tomorrow. After that, we will  most likely start step 1 diuretics as long as his renal function remained stable.  Nelida Meuse III Office: [phone]

## 2019-09-11 NOTE — Progress Notes (Signed)
Brief Narrative:  HPI: Donald Aguilar is a 68 y.o. male without known medical history presenting with abdominal pain/distention.  He reports 1 month h/o abdominal distention with increasing SOB and intermittent cough.  +early satiety.  No fever.  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  [name] here for a month.  Worsening abdominal distention, now with SOB.  Abdominal distension.  Bili 2.8.  CT with significant cirrhosis, ?multifocal PNA, ?COVID tested negative. From new-onset cirrhosis. GI consulted.   Subjective: Patient patient is a status post paracentesis with removal of 6 L of fluid on 09/10/2019.  This morning he says says he was still having abdominal discomfort.  Did tolerate p.o. diet this morning.  Denies any nausea vomiting but abdominal discomfort.  Objective: Vital signs in last 24 hours: Temp:  [97.5 F (36.4 C)-98.7 F (37.1 C)] 97.5 F (36.4 C) (11/22 0455) Pulse Rate:  [60-72] 72 (11/22 0455) Resp:  [18-19] 18 (11/22 0455) BP: (81-101)/(57-63) 81/57 (11/22 0455) SpO2:  [93 %-96 %] 93 % (11/22 0455) Weight:  [65.3 kg] 65.3 kg (11/22 0455)  Intake/Output from previous day: 11/21 0701 - 11/22 0700 In: 790 [P.O.:420] Out: 450 [Urine:450] Intake/Output this shift: Total I/O In: 120 [P.O.:120] Out: -    General:  Appears calm and comfortable and is NAD  Eyes:  PERRL, EOMI, normal lids, iris  ENT:  grossly normal hearing, lips & tongue, mmm  Neck:  no LAD, masses or thyromegaly  Cardiovascular:  RR with mild tachycardia, no m/r/g. No LE edema.   Respiratory:   Scattered rhonchi.  Normal respiratory effort.  Abdomen:  Mild to moderate distention, tympanic sound  Skin:  no rash or induration seen on limited exam  Musculoskeletal:  grossly normal tone BUE/BLE, good ROM, no bony abnormality  Psychiatric: blunted mood and affect, speech fluent and appropriate but limited by language, AOx3  Neurologic:  CN 2-12 grossly intact, moves all  extremities in coordinated fashion    Results for orders placed or performed during the hospital encounter of 09/09/19 (from the past 24 hour(s))  Basic metabolic panel daily start today     Status: Abnormal   Collection Time: 09/11/19  2:37 AM  Result Value Ref Range   Sodium 137 135 - 145 mmol/L   Potassium 3.4 (L) 3.5 - 5.1 mmol/L   Chloride 103 98 - 111 mmol/L   CO2 27 22 - 32 mmol/L   Glucose, Bld 98 70 - 99 mg/dL   BUN 8 8 - 23 mg/dL   Creatinine, Ser 0.59 (L) 0.61 - 1.24 mg/dL   Calcium 7.7 (L) 8.9 - 10.3 mg/dL   GFR calc non Af Amer >60 >60 mL/min   GFR calc Af Amer >60 >60 mL/min   Anion gap 7 5 - 15  Magnesium     Status: Abnormal   Collection Time: 09/11/19  2:37 AM  Result Value Ref Range   Magnesium 1.6 (L) 1.7 - 2.4 mg/dL    Studies/Results: Ct Abdomen Pelvis W Contrast  Result Date: 09/09/2019 CLINICAL DATA:  Abdominal pain and distention. EXAM: CT ABDOMEN AND PELVIS WITH CONTRAST TECHNIQUE: Multidetector CT imaging of the abdomen and pelvis was performed using the standard protocol following bolus administration of intravenous contrast. CONTRAST:  134mL OMNIPAQUE IOHEXOL 300 MG/ML  SOLN COMPARISON:  None. FINDINGS: Lower chest: Ground-glass airspace opacities are noted in the left upper lobe lingula and right middle lobe. More confluent opacity is noted in the dependent right middle lobe adjacent to  the hemidiaphragm and the lower lobes. Small bilateral pleural effusions. Hepatobiliary: Morphologic changes of cirrhosis with central volume loss and liver nodularity. No discrete liver mass. There are several calcifications consistent with healed granuloma. Increased attenuation material is noted in the gallbladder neck consistent with small stones or sludge. No gallbladder wall thickening. No evidence of acute cholecystitis. No bile duct dilation. Pancreas: Unremarkable. No pancreatic ductal dilatation or surrounding inflammatory changes. Spleen: Normal in size without  focal abnormality. Adrenals/Urinary Tract: Adrenal glands are unremarkable. Kidneys are normal, without renal calculi, focal lesion, or hydronephrosis. Bladder is unremarkable. Stomach/Bowel: Stomach is unremarkable. Small bowel and colon are normal in caliber. There are fluid levels noted along the small bowel. No convincing wall thickening or inflammation. Normal appendix. Vascular/Lymphatic: There are upper abdominal varices, which are prominent along the lesser curvature of the stomach contiguous with paraesophageal varices. Aortic atherosclerosis. No aneurysm. Normal enhancement of the portal, splenic and superior mesenteric veins. No enlarged lymph nodes. Reproductive: Prostate normal in size. Left inguinal canal is distended with fluid that extends into the scrotum. Other: Large amount of ascites which distends the abdomen. Diffuse subcutaneous edema. Musculoskeletal: No fracture or acute finding.  No bone lesion. IMPRESSION: 1. Ground-glass opacities in the lung bases consistent with multifocal pneumonia. This appearance can be seen with COVID-19 pneumonia. 2. Large amount of ascites which distends the abdomen. This is due to cirrhosis with portal venous hypertension. 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 3. No visualized liver mass. 4. Small effusions and diffuse subcutaneous edema. 5. No evidence of a bowel obstruction. Small-bowel air-fluid levels is consistent with a mild adynamic ileus. Electronically Signed   By: Lajean Manes M.D.   On: 09/09/2019 15:51   US Paracentesis  Result Date: 09/10/2019 INDICATION: Patient with abdominal distention, newly diagnosed cirrhosis with ascites. Request is made for diagnostic and therapeutic paracentesis. EXAM: ULTRASOUND GUIDED DIAGNOSTIC AND THERAPEUTIC PARACENTESIS MEDICATIONS: 10 mL 1% lidocaine COMPLICATIONS: None immediate. PROCEDURE: Informed written consent was obtained from the patient after a discussion of the risks,  benefits and alternatives to treatment. A timeout was performed prior to the initiation of the procedure. Initial ultrasound scanning demonstrates a large amount of ascites within the right lower abdominal quadrant. The right lower abdomen was prepped and draped in the usual sterile fashion. 1% lidocaine was used for local anesthesia. Following this, a 19 gauge, 7-cm, Yueh catheter was introduced. An ultrasound image was saved for documentation purposes. The paracentesis was performed. The catheter was removed and a dressing was applied. The patient tolerated the procedure well without immediate post procedural complication. FINDINGS: A total of approximately 6.0 liters of yellow fluid was removed. Procedure was stopped prior to removal of all fluid as this was patient's first paracentesis. Samples were sent to the laboratory as requested by the clinical team. IMPRESSION: Successful ultrasound-guided paracentesis yielding 6.0 liters of peritoneal fluid. Read by: Brynda Greathouse [name] Electronically Signed   By: Jerilynn Mages.  Shick M.D.   On: 09/10/2019 10:32   Dg Chest Portable 1 View  Result Date: 09/09/2019 CLINICAL DATA:  Shortness of breath. EXAM: PORTABLE CHEST 1 VIEW COMPARISON:  None. FINDINGS: Mild cardiomegaly is noted. No pneumothorax or pleural effusion is noted. Mild bibasilar subsegmental atelectasis is noted. Right lower lobe airspace opacity is noted concerning for possible pneumonia. Old left clavicular fracture is noted. IMPRESSION: Mild bibasilar subsegmental atelectasis. Right lower lobe airspace opacity is noted concerning for possible pneumonia. Electronically Signed   By: Bobbe Medico.D.  On: 09/09/2019 12:56    Scheduled Meds: . docusate sodium  100 mg Oral BID   Continuous Infusions: . azithromycin (ZITHROMAX) 500 MG IVPB (Vial-Mate Adaptor) Stopped (09/10/19 1911)  . cefTRIAXone (ROCEPHIN)  IV 1 g (09/10/19 1707)   PRN Meds:acetaminophen **OR** acetaminophen, ondansetron **OR**  ondansetron (ZOFRAN) IV  Assessment/Plan: Principal Problem:   Cirrhosis of liver (HCC) Active Problems:   Abdominal ascites   Shortness of breath  New-onset decompensated cirrhosis of liver with ascites, ?varices -Status post paracentesis  of 6 L yellow fluid last morning by interventional radiology. GI consulted. Recs: institute 2 g sodium diet, and patient will need to be instructed on 2 g sodium diet for discharge.  I discussed this with him today. Await cell counts on peritoneal fluid. Check hepatitis C PCR RNA quant. AFP, Start Lasix 40 mg p.o. daily and Aldactone 50 mg p.o. daily. Start Corgard 20 mg p.o. daily, Vitamin K subcu daily x3 - Greatly appreciate IR and GI recs; follow further rec -Acetic fluid albumin level was not available.  SAAG could not be calculated -Patient without known PMH, does not see a physician, presenting with 1 month of progressive abdominal distention with early satiety -CT indicates presence of ascites; paracentesis fluid analysis per GI -MELD/MELD-Na score is 16/17, with a mortality rate of 6% -He appears to have varices on CT - may need serial paracentesis -Likely associated with hepatitis C  SOB -Improving slowly  - likely associated with lung/diaphragmatic compression in the setting of severe ascites -However, CT does show GGO which may be c/w COVID-19 infection and/or multifocal PNA - Covid tested negative this time  -- Hologic testing is Negative  -Will continue Rocephin/Azithro q24h for now, although still fairly low suspicion of disease - Negative procalcitonin  Hypotension: -His BP has been running in 80s following paracentesis. Administer a total of 40 g of 25% albumin Monitor BP  SCD's    LOS: 2 days   Javonni Macke Izetta Dakin

## 2019-09-11 NOTE — Plan of Care (Signed)
  Problem: Health Behavior/Discharge Planning: Goal: Ability to manage health-related needs will improve Outcome: Progressing   

## 2019-09-11 NOTE — H&P (View-Only) (Signed)
[facility] Progress Note  Chief Complaint: Decompensated cirrhosis with massive ascites  History:  Patient seems to feel about the same today.  He still seems to indicate abdominal discomfort. I was unable to arrange for a month and yard interpreter for him today. ROS: Unable to obtain  Objective:   Current Facility-Administered Medications:  .  acetaminophen (TYLENOL) tablet 650 mg, 650 mg, Oral, Q6H PRN, 650 mg at 09/11/19 0733 **OR** acetaminophen (TYLENOL) suppository 650 mg, 650 mg, Rectal, Q6H PRN, Karmen Bongo, MD .  azithromycin (ZITHROMAX) 500 mg in sodium chloride 0.9 % 250 mL IVPB, 500 mg, Intravenous, Q24H, Karmen Bongo, MD, Stopped at 09/10/19 1911 .  cefTRIAXone (ROCEPHIN) 1 g in sodium chloride 0.9 % 100 mL IVPB, 1 g, Intravenous, Q24H, Karmen Bongo, MD, Last Rate: 200 mL/hr at 09/10/19 1707, 1 g at 09/10/19 1707 .  docusate sodium (COLACE) capsule 100 mg, 100 mg, Oral, BID, Karmen Bongo, MD, 100 mg at 09/11/19 0859 .  ondansetron (ZOFRAN) tablet 4 mg, 4 mg, Oral, Q6H PRN **OR** ondansetron (ZOFRAN) injection 4 mg, 4 mg, Intravenous, Q6H PRN, Karmen Bongo, MD  . azithromycin (ZITHROMAX) 500 MG IVPB (Vial-Mate Adaptor) Stopped (09/10/19 1911)  . cefTRIAXone (ROCEPHIN)  IV 1 g (09/10/19 1707)     Vital signs in last 24 hrs: Vitals:   09/11/19 0455 09/11/19 1420  BP: (!) 81/57 101/77  Pulse: 72 70  Resp: 18 20  Temp: (!) 97.5 F (36.4 C) 98.1 F (36.7 C)  SpO2: 93% 97%    Intake/Output Summary (Last 24 hours) at 09/11/2019 1733 Last data filed at 09/11/2019 0900 Gross per 24 hour  Intake 590 ml  Output 450 ml  Net 140 ml     Physical Exam  As before, chronically ill and cachectic temporal wasting  HEENT: sclera anicteric, oral mucosa without lesions  Cardiac: RRR without murmurs, S1S2 heard, no peripheral edema  Pulm: clear to auscultation bilaterally, fair inspiratory effort  Abdomen: soft, large volume ascites, no focal tenderness,  with active bowel sounds.   Skin; warm and dry, no jaundice  Recent Labs:  CBC Latest Ref Rng & Units 09/10/2019 09/09/2019  WBC 4.0 - 10.5 K/uL 7.4 7.9  Hemoglobin 13.0 - 17.0 g/dL 11.2(L) 13.2  Hematocrit 39.0 - 52.0 % 34.2(L) 40.8  Platelets 150 - 400 K/uL 195 222    Recent Labs  Lab 09/10/19 0223  INR 1.6*   CMP Latest Ref Rng & Units 09/11/2019 09/10/2019 09/10/2019  Glucose 70 - 99 mg/dL 98 117(H) 116(H)  BUN 8 - 23 mg/dL 8 7(L) 5(L)  Creatinine 0.61 - 1.24 mg/dL 0.59(L) 0.82 0.60(L)  Sodium 135 - 145 mmol/L 137 139 139  Potassium 3.5 - 5.1 mmol/L 3.4(L) 4.0 3.6  Chloride 98 - 111 mmol/L 103 104 103  CO2 22 - 32 mmol/L 27 26 27   Calcium 8.9 - 10.3 mg/dL 7.7(L) 8.2(L) 8.0(L)  Total Protein 6.5 - 8.1 g/dL - - 7.3  Total Bilirubin 0.3 - 1.2 mg/dL - - 1.9(H)  Alkaline Phos 38 - 126 U/L - - 61  AST 15 - 41 U/L - - 52(H)  ALT 0 - 44 U/L - - 27     @ASSESSMENTPLANBEGIN @ Assessment: New diagnosis of decompensated cirrhosis with positive hepatitis C antibody.  Hepatitis C viral load and genotype are pending to confirm chronic infection. Massive ascites Severe hypoalbuminemia and protein calorie malnutrition Elevated LFTs from cirrhosis  Plan: I have ordered another 5 L paracentesis for him tomorrow. After that, we will  most likely start step 1 diuretics as long as his renal function remained stable.  Nelida Meuse III Office: [phone]

## 2019-09-12 ENCOUNTER — Inpatient Hospital Stay (HOSPITAL_COMMUNITY): Payer: Medicare Other

## 2019-09-12 ENCOUNTER — Encounter (HOSPITAL_COMMUNITY): Payer: Self-pay | Admitting: Physician Assistant

## 2019-09-12 DIAGNOSIS — K7469 Other cirrhosis of liver: Secondary | ICD-10-CM

## 2019-09-12 HISTORY — PX: IR PARACENTESIS: IMG2679

## 2019-09-12 LAB — COMPREHENSIVE METABOLIC PANEL
ALT: 22 U/L (ref 0–44)
AST: 46 U/L — ABNORMAL HIGH (ref 15–41)
Albumin: 2.2 g/dL — ABNORMAL LOW (ref 3.5–5.0)
Alkaline Phosphatase: 52 U/L (ref 38–126)
Anion gap: 7 (ref 5–15)
BUN: 12 mg/dL (ref 8–23)
CO2: 27 mmol/L (ref 22–32)
Calcium: 8.1 mg/dL — ABNORMAL LOW (ref 8.9–10.3)
Chloride: 103 mmol/L (ref 98–111)
Creatinine, Ser: 0.72 mg/dL (ref 0.61–1.24)
GFR calc Af Amer: >60 mL/min (ref >60)
GFR calc non Af Amer: >60 mL/min (ref >60)
Glucose, Bld: 99 mg/dL (ref 70–99)
Potassium: 3.8 mmol/L (ref 3.5–5.1)
Sodium: 137 mmol/L (ref 135–145)
Total Bilirubin: 1.8 mg/dL — ABNORMAL HIGH (ref 0.3–1.2)
Total Protein: 6.9 g/dL (ref 6.5–8.1)

## 2019-09-12 LAB — ALBUMIN, PLEURAL OR PERITONEAL FLUID: Albumin, Fluid: <1 g/dL

## 2019-09-12 LAB — CBC
HCT: 35.4 % — ABNORMAL LOW (ref 39.0–52.0)
Hemoglobin: 11.8 g/dL — ABNORMAL LOW (ref 13.0–17.0)
MCH: 32.4 pg (ref 26.0–34.0)
MCHC: 33.3 g/dL (ref 30.0–36.0)
MCV: 97.3 fL (ref 80.0–100.0)
Platelets: 187 10*3/uL (ref 150–400)
RBC: 3.64 MIL/uL — ABNORMAL LOW (ref 4.22–5.81)
RDW: 14.1 % (ref 11.5–15.5)
WBC: 7.5 10*3/uL (ref 4.0–10.5)
nRBC: 0 % (ref 0.0–0.2)

## 2019-09-12 LAB — HEPATITIS B CORE ANTIBODY, IGM: Hep B C IgM: NONREACTIVE

## 2019-09-12 LAB — PROTIME-INR
INR: 1.5 — ABNORMAL HIGH (ref 0.8–1.2)
Prothrombin Time: 18.3 seconds — ABNORMAL HIGH (ref 11.4–15.2)

## 2019-09-12 LAB — CYTOLOGY - NON PAP

## 2019-09-12 IMAGING — US IR PARACENTESIS
1 series · 2 of 2 positions shown · non-contrast
Comparison: none

INDICATION: Patient with recently diagnosed decompensated cirrhosis, recurrent
ascites. Request for diagnostic and therapeutic paracentesis today
in IR with 5 L maximum.

[Series 1: (id) · 2 of 2 slices shown]
[im 1/2]
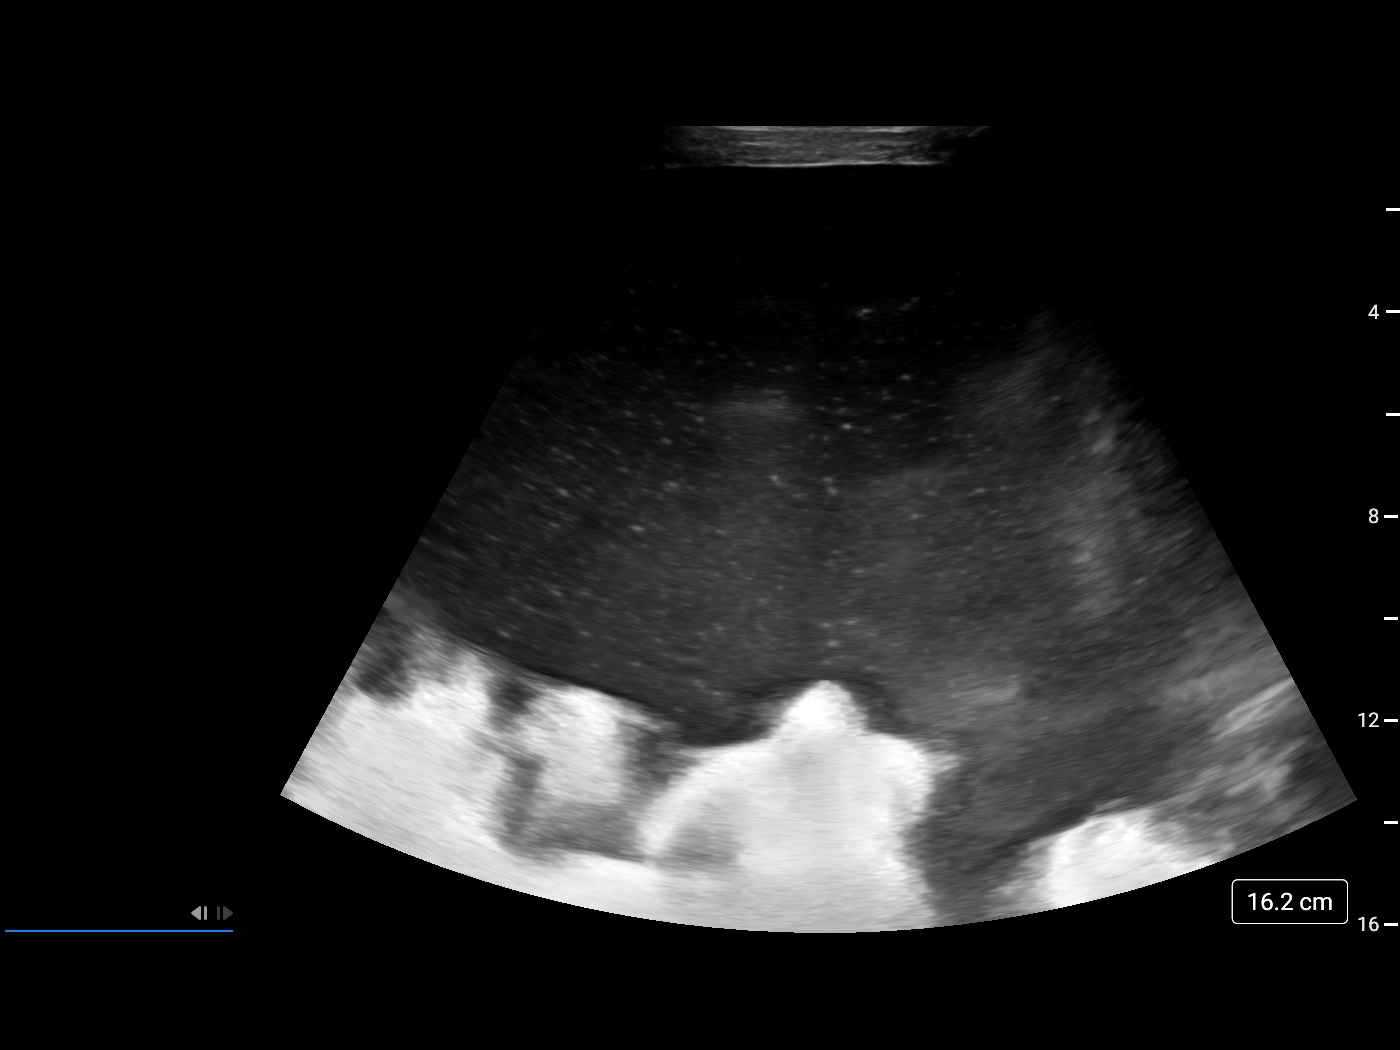
[im 2/2]
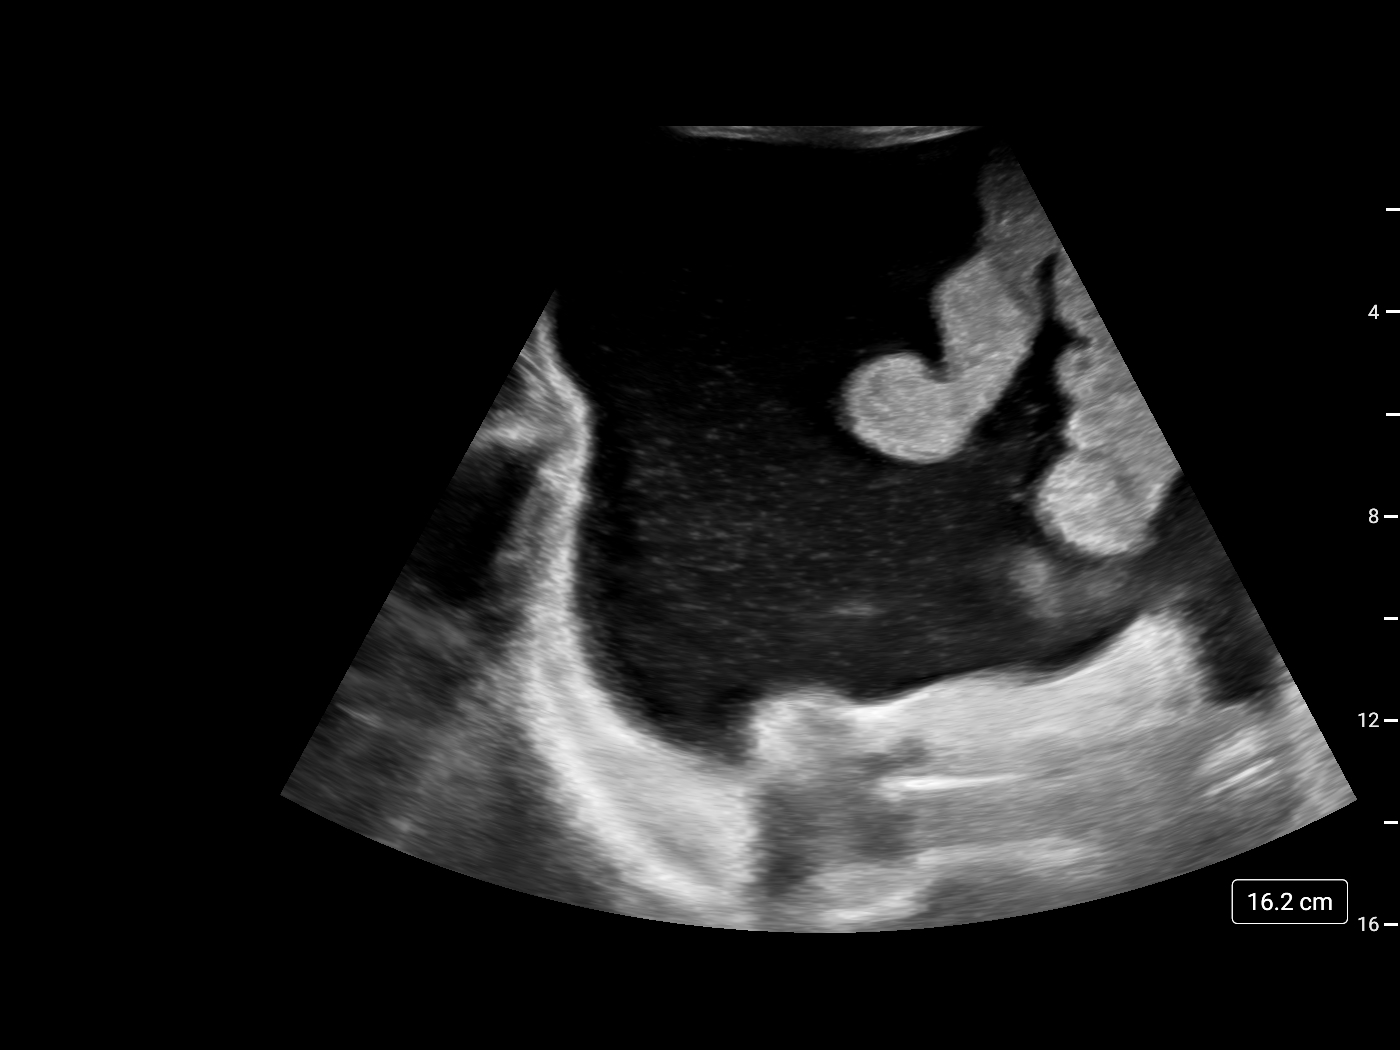

[2 of 2 positions shown; findings below may reference images not displayed]

EXAM:
ULTRASOUND GUIDED DIAGNOSTIC AND THERAPEUTIC PARACENTESIS

MEDICATIONS:
10 mL 1% lidocaine

COMPLICATIONS:
None immediate.

PROCEDURE:
Informed written consent was obtained from the patient after a
discussion of the risks, benefits and alternatives to treatment. A
timeout was performed prior to the initiation of the procedure.

Initial ultrasound scanning demonstrates a large amount of ascites
within the right lower abdominal quadrant. The right lower abdomen
was prepped and draped in the usual sterile fashion. 1% lidocaine
was used for local anesthesia.

Following this, a 6 Fr Safe-T-Centesis catheter was introduced. An
ultrasound image was saved for documentation purposes. The
paracentesis was performed. The catheter was removed and a dressing
was applied. The patient tolerated the procedure well without
immediate post procedural complication.
FINDINGS: A total of approximately 5.0 L of light, clear yellow fluid was
removed. Samples were sent to the laboratory as requested by the
clinical team.
IMPRESSION: Successful ultrasound-guided paracentesis yielding 5.0 liters of
peritoneal fluid.

Read by ABDOLKABIR

## 2019-09-12 MED ORDER — LIDOCAINE HCL 1 % IJ SOLN
INTRAMUSCULAR | Status: AC
  Filled 2019-09-12: qty 20

## 2019-09-12 MED ORDER — LIDOCAINE HCL (PF) 1 % IJ SOLN
INTRAMUSCULAR | Status: DC | PRN
  Administered 2019-09-12: 5 mL

## 2019-09-12 MED ORDER — FUROSEMIDE 20 MG PO TABS
20.0000 mg | ORAL_TABLET | Freq: Every day | ORAL | Status: DC
  Administered 2019-09-12 – 2019-09-15 (×4): 20 mg via ORAL
  Filled 2019-09-12 (×4): qty 1

## 2019-09-12 MED ORDER — SPIRONOLACTONE 25 MG PO TABS
50.0000 mg | ORAL_TABLET | Freq: Every day | ORAL | Status: DC
  Administered 2019-09-12 – 2019-09-14 (×3): 50 mg via ORAL
  Filled 2019-09-12 (×4): qty 2

## 2019-09-12 MED ORDER — ALBUMIN HUMAN 25 % IV SOLN
12.5000 g | Freq: Four times a day (QID) | INTRAVENOUS | Status: AC
  Administered 2019-09-12 (×2): 12.5 g via INTRAVENOUS
  Filled 2019-09-12 (×2): qty 50

## 2019-09-12 NOTE — Procedures (Cosign Needed)
PROCEDURE SUMMARY:  Successful image-guided paracentesis from the right lower abdomen.  Yielded 5.0 liters of light, hazy yellow fluid which was the requested maximum.  No immediate complications.  EBL: zero Patient tolerated well.   Specimen was sent for labs.  Please see imaging section of Epic for full dictation.  Joaquim Nam [name] 09/12/2019 12:24 PM

## 2019-09-12 NOTE — Progress Notes (Signed)
Daily Rounding Note  09/12/2019, 11:53 AM  LOS: 3 days   SUBJECTIVE:   Chief complaint:   New diagnosis of decompensated cirrhosis, hepatitis C, ascites Just returned from another paracentesis, 5 L (maximum allowed) removed.  Patient feels better.  No complaints.  He is talking into his lunch presently.  OBJECTIVE:         Vital signs in last 24 hours:    Temp:  [97.4 F (36.3 C)-98.1 F (36.7 C)] 97.5 F (36.4 C) (11/23 1002) Pulse Rate:  [70-92] 91 (11/23 1002) Resp:  [15-21] 21 (11/23 1002) BP: (100-111)/(69-83) 111/83 (11/23 1002) SpO2:  [94 %-97 %] 96 % (11/23 1002) Weight:  [67.3 kg] 67.3 kg (11/23 0500) Last BM Date: 09/11/19 Filed Weights   09/10/19 0023 09/11/19 0455 09/12/19 0500  Weight: 68.6 kg 65.3 kg 67.3 kg   General: Cachectic, chronically ill-appearing but alert and comfortable. Heart: RRR Chest: Clear bilaterally.  No labored breathing.  No cough. Abdomen: Soft.  Not distended.  Not tender.  Active bowel sounds. Extremities: No CCE. Neuro/Psych: Appropriate.  Alert.  Not confused.  No gross weakness or deficits, moves all 4 limbs.  No asterixis or tremors.  Intake/Output from previous day: 11/22 0701 - 11/23 0700 In: 360 [P.O.:360] Out: 100 [Urine:100]  Intake/Output this shift: Total I/O In: 120 [P.O.:120] Out: -   Lab Results: Recent Labs    09/10/19 0223 09/12/19 0507  WBC 7.4 7.5  HGB 11.2* 11.8*  HCT 34.2* 35.4*  PLT 195 187   BMET Recent Labs    09/10/19 1058 09/11/19 0237 09/12/19 0507  NA 139 137 137  K 4.0 3.4* 3.8  CL 104 103 103  CO2 26 27 27   GLUCOSE 117* 98 99  BUN 7* 8 12  CREATININE 0.82 0.59* 0.72  CALCIUM 8.2* 7.7* 8.1*   LFT Recent Labs    09/10/19 0223 09/12/19 0507  PROT 7.3 6.9  ALBUMIN 1.7* 2.2*  AST 52* 46*  ALT 27 22  ALKPHOS 61 52  BILITOT 1.9* 1.8*   PT/INR Recent Labs    09/10/19 0223 09/12/19 0507  LABPROT 18.7* 18.3*  INR  1.6* 1.5*   Hepatitis Panel Recent Labs    09/09/19 1233  HEPBSAG NON REACTIVE  HCVAB Reactive*  HEPAIGM NON REACTIVE  HEPBIGM NON REACTIVE    Studies/Results: No results found.   Scheduled Meds: . docusate sodium  100 mg Oral BID  . lidocaine       Continuous Infusions: . azithromycin (ZITHROMAX) 500 MG IVPB (Vial-Mate Adaptor) 500 mg (09/11/19 1958)  . cefTRIAXone (ROCEPHIN)  IV 1 g (09/11/19 1830)   PRN Meds:.acetaminophen **OR** acetaminophen, lidocaine (PF), ondansetron **OR** ondansetron (ZOFRAN) IV   ASSESMENT:   *    Decompensated cirrhosis with massive ascites.   New diagnosis. HCV antibody positive.  Hep C RNA quant and genotype studies pending. 09/10/2019 6 L paracentesis  *   Portal venous hypertension, prominent paraesophageal varices seen on CT with contrast 11/20.  *     Protein calorie malnutrition.  *     Multifocal pneumonia.   PLAN   *   Start Aldactone 50, Lasix 20 per day.  Observe renal function and for reaccumulation of ascites.  He is diuretic nave. Bmet every morning  *   Will need EGD to look at varices, 11/24 vs 11/25?  *   Ordered IV albumin now and repeat dose in 6 hours x 1.  Azucena Freed  09/12/2019, 11:53 AM Phone [phone]

## 2019-09-12 NOTE — TOC Benefit Eligibility Note (Signed)
Transition of Care [facility]) Benefit Eligibility Note    Patient Details  Name: Donald Aguilar MRN: [id number] Date of Birth: 1951-03-10                           Additional Notes: IN EPIC : YES  MEDICARE PART A and B , EFF-DATE: 11-20-2017. UNDER DOCUMENT NO COPY OPF MEDICARE  CARD  , NO PHARMACY BENEFITS ON Monika Salk Phone Number: 09/12/2019, 1:27 PM

## 2019-09-12 NOTE — TOC Initial Note (Signed)
Transition of Care [facility]) - Initial/Assessment Note    Patient Details  Name: Donald Aguilar MRN: [id number] Date of Birth: Sep 23, 1951  Transition of Care [facility]) CM/SW Contact:    Donald Favre, RN Phone Number: 09/12/2019, 11:39 AM  Clinical Narrative:                 Spoke to patient at bedside with assistance of Donald Aguilar interpreter  Donald Aguilar  at [phone].  Patient has lived in the [place] since 1992. He does NOT have a PCP. He use to live with his niece Donald Aguilar [phone] but now lives with a friend at the address in [place] . He applied for Medicare but never recived a card. NCM has placed a benefits check to see if Medicare listed is valid.   [facility] are [facility] , first available appointment at Patient [place] is February. If patient has active medicare will arrange follow up through Medicare.      Expected Discharge Plan: Home/Self Care Barriers to Discharge: Continued Medical Work up   Patient Goals and CMS Choice Patient states their goals for this hospitalization and ongoing recovery are:: to go home CMS Medicare.gov Compare Post Acute Care list provided to:: Patient    Expected Discharge Plan and Services Expected Discharge Plan: Home/Self Care   Discharge Planning Services: CM Consult, [facility], [place], Medication Assistance   Living arrangements for the past 2 months: [place]                 DME Arranged: N/A         HH Arranged: NA          Prior Living Arrangements/Services Living arrangements for the past 2 months: Single Family Home Lives with:: Friends Patient language and need for interpreter reviewed:: Yes        Need for Family Participation in Patient Care: Yes (Comment) Care giver support system in place?: Yes (comment)   Criminal Activity/Legal Involvement Pertinent to Current Situation/Hospitalization: No - Comment as needed  Activities of Daily Living Home Assistive  Devices/Equipment: Eyeglasses ADL Screening (condition at time of admission) Patient's cognitive ability adequate to safely complete daily activities?: Yes Is the patient deaf or have difficulty hearing?: No Does the patient have difficulty seeing, even when wearing glasses/contacts?: No(uses eyeglasses) Does the patient have difficulty concentrating, remembering, or making decisions?: No Patient able to express need for assistance with ADLs?: Yes Does the patient have difficulty walking or climbing stairs?: Yes Weakness of Legs: Both Weakness of Arms/Hands: None  Permission Sought/Granted   Permission granted to share information with : Yes, Verbal Permission Granted  Share Information with NAME: Donald Aguilar [phone]     Permission granted to share info w Relationship: niece     Emotional Assessment Appearance:: Appears stated age Attitude/Demeanor/Rapport: Engaged Affect (typically observed): Accepting Orientation: : Oriented to Self, Oriented to Place, Oriented to  Time, Oriented to Situation Alcohol / Substance Use: Not Applicable Psych Involvement: No (comment)  Admission diagnosis:  Shortness of breath [R06.02] Ascites [R18.8] Cirrhosis of liver with ascites, unspecified hepatic cirrhosis type ([place]) [K74.60, R18.8] Patient Active Problem List   Diagnosis Date Noted  . Cirrhosis of liver ([place]) 09/09/2019  . Ascites 09/09/2019  . Shortness of breath 09/09/2019   PCP:  Patient, No Pcp Per Pharmacy:   [place] ([place] - [address] [place] [address] Phone: [phone]  Fax: [place], [place] - [address] [place] [address] Phone: [phone] Fax: [phone]     Social Determinants of Health ([place]) Interventions    Readmission Risk Interventions No flowsheet data found.

## 2019-09-12 NOTE — Progress Notes (Signed)
PROGRESS NOTE    Donald Aguilar  [id number] DOB: January 14, 1951 DOA: 09/09/2019 PCP: Patient, No Pcp Per   Brief Narrative: 68 year old with no known past medical history presented with abdominal pain and distention.  He reports 1 month history of abdominal distention with increased shortness of breath and intermittent cough.  He reports early satiety. Patient was found to have new onset decompensated cirrhosis, round glass opacity on CT scan.  Upper abdominal varices on CT scan.  Hepatitis C antibody was positive concern for chronic hepatitis C as etiology of liver failure awaiting confirmatory test. GI was consulted. Recommend  Paracentesis.  Patient underwent paracentesis on 09/10/2019 yielding 6 L of fluid.  GI recommended repeating paracentesis for 09/12/2019.  Patient underwent paracentesis on 1120 12/2018 yielding 5 L of ascites fluid.     Assessment & Plan:   Principal Problem:   Cirrhosis of liver (HCC) Active Problems:   Ascites   Shortness of breath  1-New diagnosis of decompensated cirrhosis with positive hepatitis C antibody; Massive ascites;  -Underwent paracentesis x 2 (11-21) ( 11-23) 5 and 6 L of fluids removed -Hepatitis C genotype pending, hepatitis C RNA quantitative pending. -Alpha-fetoprotein serum 3.3 -Ascites fluid; cell count 51, monocyte 24.  Culture no growth.  Cytology pending -GI recommended to hold Corgard. -Follow GI recommendation in regards when to start diuretics.,  GI recommending starting Lasix and spironolactone. -Patient will receive IV albumin -for endoscopy 11-24. -checking Hepatitis B core antibody   2-Dyspnea: Might be related to: Diaphragmatic compression in the setting of severe ascites -CT does show groundglass opacity, COVID-19 negative. -Pneumonia less likely, no leukocytosis normal procalcitonin level.  Will discontinue antibiotics and observe  3-hypotension: Post paracentesis.  He received albumin with improvement of hypotension.  Will receive albumin today p.o. paracentesis     There is no height or weight on file to calculate BMI.   DVT prophylaxis: SCDs Code Status: Full code Family Communication: Care discussed with patient Disposition Plan: Remain in the hospital for treatment of liver failure and massive ascites, for paracentesis today Consultants:  GI IR  Procedures:   Paracentesis (11-21) 6 L and (11-23)  5 L.   Antimicrobials:  Received 2 days of ceftriaxone and a azithromycin  Subjective: Alert, still complaining of abdominal pain and abdominal distention.  Poor oral intake.  Objective: Vitals:   09/12/19 0500 09/12/19 0816 09/12/19 1002 09/12/19 1407  BP:  106/77 111/83 91/60  Pulse:  92 91 88  Resp:  16 (!) 21 20  Temp:  97.7 F (36.5 C) (!) 97.5 F (36.4 C) 98 F (36.7 C)  TempSrc:  Oral Oral Oral  SpO2:  96% 96% 97%  Weight: 67.3 kg       Intake/Output Summary (Last 24 hours) at 09/12/2019 1620 Last data filed at 09/12/2019 0956 Gross per 24 hour  Intake 360 ml  Output 100 ml  Net 260 ml   Filed Weights   09/10/19 0023 09/11/19 0455 09/12/19 0500  Weight: 68.6 kg 65.3 kg 67.3 kg    Examination:  General exam: Cachectic Respiratory system: Decreased breath sounds. Respiratory effort normal. Cardiovascular system: S1 & S2 heard, RRR.  Gastrointestinal system: Abdomen is significantly distended, soft and nontender.  Central nervous system: Alert and oriented.  Extremities: Symmetric 5 x 5 power. Skin: No rashes, lesions or ulcers    Data Reviewed: I have personally reviewed following labs and imaging studies  CBC: Recent Labs  Lab 09/09/19 1115 09/10/19 0223 09/12/19 0507  WBC 7.9 7.4 7.5  NEUTROABS 5.3  --   --   HGB 13.2 11.2* 11.8*  HCT 40.8 34.2* 35.4*  MCV 100.0 96.9 97.3  PLT 222 195 [id number]   Basic Metabolic Panel: Recent Labs  Lab 09/09/19 1115 09/10/19 0223 09/10/19 1058 09/11/19 0237 09/12/19 0507  NA 139 139 139 137 137  K 3.3* 3.6 4.0  3.4* 3.8  CL 102 103 104 103 103  CO2 26 27 26 27 27   GLUCOSE 124* 116* 117* 98 99  BUN 6* 5* 7* 8 12  CREATININE 0.82 0.60* 0.82 0.59* 0.72  CALCIUM 8.4* 8.0* 8.2* 7.7* 8.1*  MG  --   --   --  1.6*  --    GFR: CrCl cannot be calculated (Unknown ideal weight.). Liver Function Tests: Recent Labs  Lab 09/09/19 1115 09/10/19 0223 09/12/19 0507  AST 64* 52* 46*  ALT 29 27 22   ALKPHOS 77 61 52  BILITOT 2.8* 1.9* 1.8*  PROT 8.2* 7.3 6.9  ALBUMIN 1.9* 1.7* 2.2*   Recent Labs  Lab 09/09/19 1115  LIPASE 44   Recent Labs  Lab 09/09/19 1233  AMMONIA 60*   Coagulation Profile: Recent Labs  Lab 09/09/19 1233 09/10/19 0223 09/12/19 0507  INR 1.6* 1.6* 1.5*   Cardiac Enzymes: No results for input(s): CKTOTAL, CKMB, CKMBINDEX, TROPONINI in the last 168 hours. BNP (last 3 results) No results for input(s): PROBNP in the last 8760 hours. HbA1C: No results for input(s): HGBA1C in the last 72 hours. CBG: No results for input(s): GLUCAP in the last 168 hours. Lipid Profile: No results for input(s): CHOL, HDL, LDLCALC, TRIG, CHOLHDL, LDLDIRECT in the last 72 hours. Thyroid Function Tests: No results for input(s): TSH, T4TOTAL, FREET4, T3FREE, THYROIDAB in the last 72 hours. Anemia Panel: No results for input(s): VITAMINB12, FOLATE, FERRITIN, TIBC, IRON, RETICCTPCT in the last 72 hours. Sepsis Labs: Recent Labs  Lab 09/09/19 1612 09/09/19 1744 09/09/19 1816  PROCALCITON  --  <0.10  --   LATICACIDVEN 1.5  --  1.4    Recent Results (from the past 240 hour(s))  Urine culture     Status: Abnormal   Collection Time: 09/09/19 11:13 AM   Specimen: Urine, Random  Result Value Ref Range Status   Specimen Description URINE, RANDOM  Final   Special Requests NONE  Final   Culture (A)  Final    <10,000 COLONIES/mL INSIGNIFICANT GROWTH Performed at [facility], [address] [place], [place] [address]    Report Status 09/11/2019 FINAL  Final  Blood culture (routine x  2)     Status: None (Preliminary result)   Collection Time: 09/09/19  5:07 PM   Specimen: BLOOD RIGHT HAND  Result Value Ref Range Status   Specimen Description BLOOD RIGHT HAND  Final   Special Requests   Final    BOTTLES DRAWN AEROBIC AND ANAEROBIC Blood Culture results may not be optimal due to an inadequate volume of blood received in culture bottles   Culture   Final    NO GROWTH 3 DAYS Performed at [facility], [place] [address], [place], [place] [address]    Report Status PENDING  Incomplete  Blood culture (routine x 2)     Status: None (Preliminary result)   Collection Time: 09/09/19  5:07 PM   Specimen: BLOOD  Result Value Ref Range Status   Specimen Description BLOOD RIGHT ANTECUBITAL  Final   Special Requests   Final    BOTTLES DRAWN AEROBIC AND ANAEROBIC Blood  Culture adequate volume   Culture   Final    NO GROWTH 3 DAYS Performed at [facility], [place] [address], [place], [place] [address]    Report Status PENDING  Incomplete  SARS CORONAVIRUS 2 (TAT 6-24 HRS) Nasopharyngeal Nasopharyngeal Swab     Status: None   Collection Time: 09/09/19  6:16 PM   Specimen: Nasopharyngeal Swab  Result Value Ref Range Status   SARS Coronavirus 2 NEGATIVE NEGATIVE Final    Comment: (NOTE) SARS-CoV-2 target nucleic acids are NOT DETECTED. The SARS-CoV-2 RNA is generally detectable in upper and lower respiratory specimens during the acute phase of infection. Negative results do not preclude SARS-CoV-2 infection, do not rule out co-infections with other pathogens, and should not be used as the sole basis for treatment or other patient management decisions. Negative results must be combined with clinical observations, patient history, and epidemiological information. The expected result is Negative. Fact Sheet for Patients: SugarRoll.be Fact Sheet for Healthcare Providers: https://www.woods-mathews.com/ This test is not yet approved  or cleared by the [place] FDA and  has been authorized for detection and/or diagnosis of SARS-CoV-2 by FDA under an Emergency Use Authorization (EUA). This EUA will remain  in effect (meaning this test can be used) for the duration of the COVID-19 declaration under Section 56 4(b)(1) of the Act, 21 U.S.C. section 360bbb-3(b)(1), unless the authorization is terminated or revoked sooner. Performed at [facility], [place] [address], [place], [place] [address]   Gram stain     Status: None   Collection Time: 09/10/19 10:13 AM   Specimen: PATH Cytology Peritoneal fluid  Result Value Ref Range Status   Specimen Description PERITONEAL CYTO  Final   Special Requests NONE  Final   Gram Stain   Final    RARE WBC PRESENT, PREDOMINANTLY MONONUCLEAR NO ORGANISMS SEEN Performed at [facility], [place] [address], [place], [place] [address]    Report Status 09/10/2019 FINAL  Final  Culture, body fluid-bottle     Status: None (Preliminary result)   Collection Time: 09/10/19 10:13 AM   Specimen: Peritoneal Washings  Result Value Ref Range Status   Specimen Description PERITONEAL  Final   Special Requests NONE  Final   Culture   Final    NO GROWTH 2 DAYS Performed at [facility] [address] [place], [place] [address]    Report Status PENDING  Incomplete         Radiology Studies: Ir Paracentesis  Result Date: 09/12/2019 INDICATION: Patient with recently diagnosed decompensated cirrhosis, recurrent ascites. Request for diagnostic and therapeutic paracentesis today in IR with 5 L maximum. EXAM: ULTRASOUND GUIDED DIAGNOSTIC AND THERAPEUTIC PARACENTESIS MEDICATIONS: 10 mL 1% lidocaine COMPLICATIONS: None immediate. PROCEDURE: Informed written consent was obtained from the patient after a discussion of the risks, benefits and alternatives to treatment. A timeout was performed prior to the initiation of the procedure. Initial ultrasound scanning demonstrates a large  amount of ascites within the right lower abdominal quadrant. The right lower abdomen was prepped and draped in the usual sterile fashion. 1% lidocaine was used for local anesthesia. Following this, a 6 Fr Safe-T-Centesis catheter was introduced. An ultrasound image was saved for documentation purposes. The paracentesis was performed. The catheter was removed and a dressing was applied. The patient tolerated the procedure well without immediate post procedural complication. FINDINGS: A total of approximately 5.0 L of light, clear yellow fluid was removed. Samples were sent to the laboratory as  requested by the clinical team. IMPRESSION: Successful ultrasound-guided paracentesis yielding 5.0 liters of peritoneal fluid. Read by Candiss Norse, [name] Electronically Signed   By: Aletta Edouard M.D.   On: 09/12/2019 12:51        Scheduled Meds: . docusate sodium  100 mg Oral BID  . furosemide  20 mg Oral Daily  . lidocaine      . spironolactone  50 mg Oral Daily   Continuous Infusions: . albumin human 12.5 g (09/12/19 1530)  . azithromycin (ZITHROMAX) 500 MG IVPB (Vial-Mate [employer]or) 500 mg (09/11/19 1958)  . cefTRIAXone (ROCEPHIN)  IV 1 g (09/11/19 1830)     LOS: 3 days    Time spent: 35 minutes.     Elmarie Shiley, MD [facility]  If 7PM-7AM, please contact night-coverage [email or web address] Password [email or web address] 09/12/2019, 4:20 PM

## 2019-09-13 ENCOUNTER — Other Ambulatory Visit: Payer: Self-pay

## 2019-09-13 ENCOUNTER — Encounter (HOSPITAL_COMMUNITY): Payer: Self-pay | Admitting: *Deleted

## 2019-09-13 ENCOUNTER — Inpatient Hospital Stay (HOSPITAL_COMMUNITY): Payer: Medicare Other | Admitting: Certified Registered Nurse Anesthetist

## 2019-09-13 ENCOUNTER — Encounter (HOSPITAL_COMMUNITY)
Admission: EM | Disposition: A | Discharge status: Home / Self Care | Payer: Self-pay | Source: Home / Self Care | Attending: Internal Medicine

## 2019-09-13 DIAGNOSIS — R935 Abnormal findings on diagnostic imaging of other abdominal regions, including retroperitoneum: Secondary | ICD-10-CM

## 2019-09-13 DIAGNOSIS — I8510 Secondary esophageal varices without bleeding: Secondary | ICD-10-CM

## 2019-09-13 DIAGNOSIS — K2960 Other gastritis without bleeding: Secondary | ICD-10-CM

## 2019-09-13 DIAGNOSIS — K766 Portal hypertension: Secondary | ICD-10-CM

## 2019-09-13 HISTORY — PX: ESOPHAGOGASTRODUODENOSCOPY (EGD) WITH PROPOFOL: SHX5813

## 2019-09-13 HISTORY — PX: BIOPSY: SHX5522

## 2019-09-13 LAB — BASIC METABOLIC PANEL
Anion gap: 6 (ref 5–15)
BUN: 10 mg/dL (ref 8–23)
CO2: 26 mmol/L (ref 22–32)
Calcium: 7.9 mg/dL — ABNORMAL LOW (ref 8.9–10.3)
Chloride: 104 mmol/L (ref 98–111)
Creatinine, Ser: 0.68 mg/dL (ref 0.61–1.24)
GFR calc Af Amer: >60 mL/min (ref >60)
GFR calc non Af Amer: >60 mL/min (ref >60)
Glucose, Bld: 102 mg/dL — ABNORMAL HIGH (ref 70–99)
Potassium: 3.7 mmol/L (ref 3.5–5.1)
Sodium: 136 mmol/L (ref 135–145)

## 2019-09-13 LAB — MAGNESIUM: Magnesium: 1.7 mg/dL (ref 1.7–2.4)

## 2019-09-13 LAB — HEPATITIS B CORE ANTIBODY, TOTAL: Hep B Core Total Ab: REACTIVE — AB

## 2019-09-13 SURGERY — ESOPHAGOGASTRODUODENOSCOPY (EGD) WITH PROPOFOL
Anesthesia: Monitor Anesthesia Care

## 2019-09-13 MED ORDER — LACTATED RINGERS IV SOLN
INTRAVENOUS | Status: DC
  Administered 2019-09-13: 13:00:00 via INTRAVENOUS

## 2019-09-13 MED ORDER — PROPOFOL 10 MG/ML IV BOLUS
INTRAVENOUS | Status: DC | PRN
  Administered 2019-09-13 (×2): 20 mg via INTRAVENOUS

## 2019-09-13 MED ORDER — PANTOPRAZOLE SODIUM 40 MG PO TBEC
40.0000 mg | DELAYED_RELEASE_TABLET | Freq: Every day | ORAL | Status: DC
  Administered 2019-09-13 – 2019-09-15 (×3): 40 mg via ORAL
  Filled 2019-09-13 (×3): qty 1

## 2019-09-13 MED ORDER — PHENYLEPHRINE HCL (PRESSORS) 10 MG/ML IV SOLN
INTRAVENOUS | Status: DC | PRN
  Administered 2019-09-13: 80 ug via INTRAVENOUS

## 2019-09-13 MED ORDER — PROPOFOL 500 MG/50ML IV EMUL
INTRAVENOUS | Status: DC | PRN
  Administered 2019-09-13: 100 ug/kg/min via INTRAVENOUS

## 2019-09-13 SURGICAL SUPPLY — 15 items

## 2019-09-13 NOTE — Plan of Care (Signed)
  Problem: Activity: Goal: Risk for activity intolerance will decrease Outcome: Progressing   Problem: Nutrition: Goal: Adequate nutrition will be maintained Outcome: Progressing   Problem: Coping: Goal: Level of anxiety will decrease Outcome: Progressing   

## 2019-09-13 NOTE — Anesthesia Postprocedure Evaluation (Signed)
Anesthesia Post Note  Patient: Lorane Gell  Procedure(s) Performed: ESOPHAGOGASTRODUODENOSCOPY (EGD) WITH PROPOFOL (N/A ) BIOPSY     Patient location during evaluation: Endoscopy Anesthesia Type: MAC Level of consciousness: awake Pain management: pain level controlled Vital Signs Assessment: post-procedure vital signs reviewed and stable Respiratory status: spontaneous breathing, nonlabored ventilation, respiratory function stable and patient connected to nasal cannula oxygen Cardiovascular status: stable and blood pressure returned to baseline Postop Assessment: no apparent nausea or vomiting Anesthetic complications: no    Last Vitals:  Vitals:   09/13/19 1410 09/13/19 1419  BP: 112/77 106/72  Pulse: 80 77  Resp: 17 13  Temp:    SpO2: 100% 100%    Last Pain:  Vitals:   09/13/19 1419  TempSrc:   PainSc: 0-No pain                 Ryan P Ellender

## 2019-09-13 NOTE — Progress Notes (Signed)
PROGRESS NOTE    Donald Aguilar  [id number] DOB: 1951-09-29 DOA: 09/09/2019 PCP: Patient, No Pcp Per   Brief Narrative: 68 year old with no known past medical history presented with abdominal pain and distention.  He reports 1 month history of abdominal distention with increased shortness of breath and intermittent cough.  He reports early satiety. Patient was found to have new onset decompensated cirrhosis, round glass opacity on CT scan.  Upper abdominal varices on CT scan.  Hepatitis C antibody was positive concern for chronic hepatitis C as etiology of liver failure awaiting confirmatory test. GI was consulted. Recommend  Paracentesis.  Patient underwent paracentesis on 09/10/2019 yielding 6 L of fluid.  GI recommended repeating paracentesis for 09/12/2019.  Patient underwent paracentesis on 1120 12/2018 yielding 5 L of ascites fluid.  Plan for endoscopy today.    Assessment & Plan:   Principal Problem:   Cirrhosis of liver (HCC) Active Problems:   Ascites   Shortness of breath  1-New diagnosis of decompensated cirrhosis with positive hepatitis C antibody; Massive ascites;  -Underwent paracentesis x 2 (11-21) ( 11-23) 5 and 6 L of fluids removed -Hepatitis C genotype pending, hepatitis C RNA quantitative pending. -Alpha-fetoprotein serum 3.3. -Ascites fluid; cell count 51, monocyte 24.  Culture no growth.  Cytology pending -GI recommended to hold Corgard. -Started  Lasix and spironolactone 11/23.. -Patient received  IV albumin post paracentesis.  -for endoscopy 11-24. -Hepatitis B core antibody reactive.  Has significant less abdominal distension.   2-Dyspnea: Might be related to: Diaphragmatic compression in the setting of severe ascites -CT does show groundglass opacity, COVID-19 negative. -Pneumonia less likely, no leukocytosis normal procalcitonin level.   Discontinue antibiotics and observe. Afebrile.   3-hypotension: Post paracentesis.  He received albumin with  improvement of hypotension. Resolved. Tolerating diuretics so far.   Hypomagnesemia; resolved.    There is no height or weight on file to calculate BMI.   DVT prophylaxis: SCDs Code Status: Full code Family Communication: Care discussed with patient Disposition Plan: Remain in the hospital for treatment of liver failure and massive ascites, for paracentesis today Consultants:  GI IR  Procedures:   Paracentesis (11-21) 6 L and (11-23)  5 L.   Antimicrobials:  Received 2 days of ceftriaxone and a azithromycin  Subjective: He is feeling better, abdomen is less distended. He would like to eat. He is currently NPO for endoscopy today. Explain that to patient.   Objective: Vitals:   09/12/19 1002 09/12/19 1407 09/12/19 2111 09/13/19 0508  BP: 111/83 91/60 100/72 104/76  Pulse: 91 88 94 91  Resp: (!) 21 20 17 17   Temp: (!) 97.5 F (36.4 C) 98 F (36.7 C) 98.2 F (36.8 C) 98.2 F (36.8 C)  TempSrc: Oral Oral Oral Oral  SpO2: 96% 97% 98% 99%  Weight:        Intake/Output Summary (Last 24 hours) at 09/13/2019 1240 Last data filed at 09/13/2019 [id number] Gross per 24 hour  Intake 0 ml  Output -  Net 0 ml   Filed Weights   09/10/19 0023 09/11/19 0455 09/12/19 0500  Weight: 68.6 kg 65.3 kg 67.3 kg    Examination:  General exam: Cachetic Respiratory system: CTA Cardiovascular system: S 1, S 2 RRR Gastrointestinal system:BS present, soft, less distended Central nervous system: Alert and oriented Extremities: Symmetric power.  Skin: No rashes.    Data Reviewed: I have personally reviewed following labs and imaging studies  CBC: Recent Labs  Lab 09/09/19 1115 09/10/19 0223 09/12/19 0507  WBC 7.9 7.4 7.5  NEUTROABS 5.3  --   --   HGB 13.2 11.2* 11.8*  HCT 40.8 34.2* 35.4*  MCV 100.0 96.9 97.3  PLT 222 195 [id number]   Basic Metabolic Panel: Recent Labs  Lab 09/10/19 0223 09/10/19 1058 09/11/19 0237 09/12/19 0507 09/13/19 0212 09/13/19 0817  NA 139 139 137  137 136  --   K 3.6 4.0 3.4* 3.8 3.7  --   CL 103 104 103 103 104  --   CO2 27 26 27 27 26   --   GLUCOSE 116* 117* 98 99 102*  --   BUN 5* 7* 8 12 10   --   CREATININE 0.60* 0.82 0.59* 0.72 0.68  --   CALCIUM 8.0* 8.2* 7.7* 8.1* 7.9*  --   MG  --   --  1.6*  --   --  1.7   GFR: CrCl cannot be calculated (Unknown ideal weight.). Liver Function Tests: Recent Labs  Lab 09/09/19 1115 09/10/19 0223 09/12/19 0507  AST 64* 52* 46*  ALT 29 27 22   ALKPHOS 77 61 52  BILITOT 2.8* 1.9* 1.8*  PROT 8.2* 7.3 6.9  ALBUMIN 1.9* 1.7* 2.2*   Recent Labs  Lab 09/09/19 1115  LIPASE 44   Recent Labs  Lab 09/09/19 1233  AMMONIA 60*   Coagulation Profile: Recent Labs  Lab 09/09/19 1233 09/10/19 0223 09/12/19 0507  INR 1.6* 1.6* 1.5*   Cardiac Enzymes: No results for input(s): CKTOTAL, CKMB, CKMBINDEX, TROPONINI in the last 168 hours. BNP (last 3 results) No results for input(s): PROBNP in the last 8760 hours. HbA1C: No results for input(s): HGBA1C in the last 72 hours. CBG: No results for input(s): GLUCAP in the last 168 hours. Lipid Profile: No results for input(s): CHOL, HDL, LDLCALC, TRIG, CHOLHDL, LDLDIRECT in the last 72 hours. Thyroid Function Tests: No results for input(s): TSH, T4TOTAL, FREET4, T3FREE, THYROIDAB in the last 72 hours. Anemia Panel: No results for input(s): VITAMINB12, FOLATE, FERRITIN, TIBC, IRON, RETICCTPCT in the last 72 hours. Sepsis Labs: Recent Labs  Lab 09/09/19 1612 09/09/19 1744 09/09/19 1816  PROCALCITON  --  <0.10  --   LATICACIDVEN 1.5  --  1.4    Recent Results (from the past 240 hour(s))  Urine culture     Status: Abnormal   Collection Time: 09/09/19 11:13 AM   Specimen: Urine, Random  Result Value Ref Range Status   Specimen Description URINE, RANDOM  Final   Special Requests NONE  Final   Culture (A)  Final    <10,000 COLONIES/mL INSIGNIFICANT GROWTH Performed at [facility], [address] [place], [place] [address]     Report Status 09/11/2019 FINAL  Final  Blood culture (routine x 2)     Status: None (Preliminary result)   Collection Time: 09/09/19  5:07 PM   Specimen: BLOOD RIGHT HAND  Result Value Ref Range Status   Specimen Description BLOOD RIGHT HAND  Final   Special Requests   Final    BOTTLES DRAWN AEROBIC AND ANAEROBIC Blood Culture results may not be optimal due to an inadequate volume of blood received in culture bottles   Culture   Final    NO GROWTH 3 DAYS Performed at [facility], [place] [address], [place], [place] [address]    Report Status PENDING  Incomplete  Blood culture (routine x 2)     Status: None (Preliminary result)   Collection Time: 09/09/19  5:07 PM   Specimen:  BLOOD  Result Value Ref Range Status   Specimen Description BLOOD RIGHT ANTECUBITAL  Final   Special Requests   Final    BOTTLES DRAWN AEROBIC AND ANAEROBIC Blood Culture adequate volume   Culture   Final    NO GROWTH 3 DAYS Performed at [facility], [address] [place], [place] [address]    Report Status PENDING  Incomplete  SARS CORONAVIRUS 2 (TAT 6-24 HRS) Nasopharyngeal Nasopharyngeal Swab     Status: None   Collection Time: 09/09/19  6:16 PM   Specimen: Nasopharyngeal Swab  Result Value Ref Range Status   SARS Coronavirus 2 NEGATIVE NEGATIVE Final    Comment: (NOTE) SARS-CoV-2 target nucleic acids are NOT DETECTED. The SARS-CoV-2 RNA is generally detectable in upper and lower respiratory specimens during the acute phase of infection. Negative results do not preclude SARS-CoV-2 infection, do not rule out co-infections with other pathogens, and should not be used as the sole basis for treatment or other patient management decisions. Negative results must be combined with clinical observations, patient history, and epidemiological information. The expected result is Negative. Fact Sheet for Patients: SugarRoll.be Fact Sheet for Healthcare Providers:  https://www.woods-mathews.com/ This test is not yet approved or cleared by the [place] FDA and  has been authorized for detection and/or diagnosis of SARS-CoV-2 by FDA under an Emergency Use Authorization (EUA). This EUA will remain  in effect (meaning this test can be used) for the duration of the COVID-19 declaration under Section 56 4(b)(1) of the Act, 21 U.S.C. section 360bbb-3(b)(1), unless the authorization is terminated or revoked sooner. Performed at [facility], [place] [address], [place], [place] [address]   Gram stain     Status: None   Collection Time: 09/10/19 10:13 AM   Specimen: PATH Cytology Peritoneal fluid  Result Value Ref Range Status   Specimen Description PERITONEAL CYTO  Final   Special Requests NONE  Final   Gram Stain   Final    RARE WBC PRESENT, PREDOMINANTLY MONONUCLEAR NO ORGANISMS SEEN Performed at [facility], [place] [address], [place], [place] [address]    Report Status 09/10/2019 FINAL  Final  Culture, body fluid-bottle     Status: None (Preliminary result)   Collection Time: 09/10/19 10:13 AM   Specimen: Peritoneal Washings  Result Value Ref Range Status   Specimen Description PERITONEAL  Final   Special Requests NONE  Final   Culture   Final    NO GROWTH 2 DAYS Performed at [facility] [address], [place], [place] [address]    Report Status PENDING  Incomplete         Radiology Studies: Ir Paracentesis  Result Date: 09/12/2019 INDICATION: Patient with recently diagnosed decompensated cirrhosis, recurrent ascites. Request for diagnostic and therapeutic paracentesis today in IR with 5 L maximum. EXAM: ULTRASOUND GUIDED DIAGNOSTIC AND THERAPEUTIC PARACENTESIS MEDICATIONS: 10 mL 1% lidocaine COMPLICATIONS: None immediate. PROCEDURE: Informed written consent was obtained from the patient after a discussion of the risks, benefits and alternatives to treatment. A timeout was performed prior to the  initiation of the procedure. Initial ultrasound scanning demonstrates a large amount of ascites within the right lower abdominal quadrant. The right lower abdomen was prepped and draped in the usual sterile fashion. 1% lidocaine was used for local anesthesia. Following this, a 6 Fr Safe-T-Centesis catheter was introduced. An ultrasound image was saved for documentation purposes. The paracentesis was performed. The catheter was removed and a dressing was applied.  The patient tolerated the procedure well without immediate post procedural complication. FINDINGS: A total of approximately 5.0 L of light, clear yellow fluid was removed. Samples were sent to the laboratory as requested by the clinical team. IMPRESSION: Successful ultrasound-guided paracentesis yielding 5.0 liters of peritoneal fluid. Read by Candiss Norse, [name] Electronically Signed   By: Aletta Edouard M.D.   On: 09/12/2019 12:51        Scheduled Meds: . docusate sodium  100 mg Oral BID  . furosemide  20 mg Oral Daily  . spironolactone  50 mg Oral Daily   Continuous Infusions:    LOS: 4 days    Time spent: 35 minutes.     Elmarie Shiley, MD [facility]  If 7PM-7AM, please contact night-coverage [email or web address] Password [email or web address] 09/13/2019, 12:40 PM

## 2019-09-13 NOTE — Interval H&P Note (Signed)
History and Physical Interval Note:  09/13/2019 1:19 PM  Donald Aguilar  has presented today for surgery, with the diagnosis of Screen for varices in patient with new diagnosis of cirrhosis.  The various methods of treatment have been discussed with the patient and family. After consideration of risks, benefits and other options for treatment, the patient has consented to  Procedure(s): ESOPHAGOGASTRODUODENOSCOPY (EGD) WITH PROPOFOL (N/A) as a surgical intervention.  The patient's history has been reviewed, patient examined, no change in status, stable for surgery.  I have reviewed the patient's chart and labs.  Questions were answered to the patient's satisfaction.     Pricilla Riffle. [place] Plan

## 2019-09-13 NOTE — Op Note (Signed)
[facility] Patient Name: Donald Aguilar Procedure Date : 09/13/2019 MRN: [id number] Attending MD: Ladene Artist , MD Date of Birth: [birth year] CSN: [id number] Age: 68 Admit Type: Inpatient Procedure:                Upper GI endoscopy Indications:              Abnormal CT of the GI tract: newly diagnosed                            decompensation cirrhosis, upper abdominal /                            paraesophageal varices on CT Providers:                Pricilla Riffle. Fuller Plan, MD, Baird Cancer, RN, Lina Sar, Technician, Tawni Levy CRNA Referring MD:             [facility] Medicines:                Monitored Anesthesia Care Complications:            No immediate complications. Estimated Blood Loss:     Estimated blood loss was minimal. Procedure:                Pre-Anesthesia Assessment:                           - Prior to the procedure, a History and Physical                            was performed, and patient medications and                            allergies were reviewed. The patient's tolerance of                            previous anesthesia was also reviewed. The risks                            and benefits of the procedure and the sedation                            options and risks were discussed with the patient.                            All questions were answered, and informed consent                            was obtained. Prior Anticoagulants: The patient has                            taken no previous anticoagulant or antiplatelet  agents. ASA Grade Assessment: III - A patient with                            severe systemic disease. After reviewing the risks                            and benefits, the patient was deemed in                            satisfactory condition to undergo the procedure.                           After obtaining informed consent, the endoscope was       passed under direct vision. Throughout the                            procedure, the patient's blood pressure, pulse, and                            oxygen saturations were monitored continuously. The                            [id number]) Olympus gastroscope was                            introduced through the mouth, and advanced to the                            second part of duodenum. The upper GI endoscopy was                            accomplished without difficulty. The patient                            tolerated the procedure well. Scope In: Scope Out: Findings:      Three columns of grade II varices with no bleeding and no stigmata of       recent bleeding were found in the distal esophagus,. They were 4 mm in       largest diameter. No red wale signs were present.      The exam of the esophagus was otherwise normal.      A few localized small erosions with no bleeding and no stigmata of       recent bleeding were found in the gastric antrum. Biopsies were taken       with a cold forceps for histology.      Moderate portal hypertensive gastropathy was found in the entire       examined stomach.      The exam of the stomach was otherwise normal.      The duodenal bulb and second portion of the duodenum were normal. Impression:               - Grade II esophageal varices with no bleeding and  no stigmata of recent bleeding.                           - Erosive gastropathy with no bleeding and no                            stigmata of recent bleeding. Biopsied.                           - Portal hypertensive gastropathy.                           - Normal duodenal bulb and second portion of the                            duodenum. Recommendation:           - Return patient to hospital ward for ongoing care.                           - Resume previous diet.                           - Continue present medications.                           -  Await pathology results.                           - Outpatient GI follow up with Dr. Loletha Carrow. Procedure Code(s):        --- Professional ---                           [phone], Esophagogastroduodenoscopy, flexible,                            transoral; with biopsy, single or multiple Diagnosis Code(s):        --- Professional ---                           I85.00, Esophageal varices without bleeding                           K31.89, Other diseases of stomach and duodenum                           K76.6, Portal hypertension                           R93.3, Abnormal findings on diagnostic imaging of                            other parts of digestive tract CPT copyright 2019 [employer]. All rights reserved. The codes documented in this report are preliminary and upon coder review may  be revised to meet current compliance requirements. Ladene Artist, MD 09/13/2019 2:01:10 PM This report has been signed electronically. Number of Addenda: 0

## 2019-09-13 NOTE — TOC Progression Note (Signed)
Transition of Care [facility]) - Progression Note    Patient Details  Name: Star Corless MRN: [id number] Date of Birth: Sep 04, 1951  Transition of Care [facility]) CM/SW Contact  Jacalyn Lefevre Edson Snowball, RN Phone Number: 09/13/2019, 10:41 AM  Clinical Narrative:     Confirmed patient does have Medicare. Scheduled follow up appointment at [facility] [address] , [place], [place] [address] on October 05, 2019 at 1:30 pm with Orlean Patten NP . Placed on AVS.  Cancelled appointment at [facility] [place] .  Expected Discharge Plan: Home/Self Care Barriers to Discharge: Continued Medical Work up  Expected Discharge Plan and Services Expected Discharge Plan: Home/Self Care   Discharge Planning Services: CM Consult, [facility], [place], Medication Assistance   Living arrangements for the past 2 months: Single Family Home                 DME Arranged: N/A         HH Arranged: NA           Social Determinants of Health (SDOH) Interventions    Readmission Risk Interventions No flowsheet data found.

## 2019-09-13 NOTE — Transfer of Care (Signed)
Immediate Anesthesia Transfer of Care Note  Patient: Lorane Gell  Procedure(s) Performed: ESOPHAGOGASTRODUODENOSCOPY (EGD) WITH PROPOFOL (N/A ) BIOPSY  Patient Location: Endoscopy Unit  Anesthesia Type:MAC  Level of Consciousness: drowsy and patient cooperative  Airway & Oxygen Therapy: Patient Spontanous Breathing and Patient connected to nasal cannula oxygen  Post-op Assessment: Report given to RN, Post -op Vital signs reviewed and stable and Patient moving all extremities  Post vital signs: Reviewed and stable  Last Vitals:  Vitals Value Taken Time  BP    Temp    Pulse 86 09/13/19 1400  Resp 22 09/13/19 1400  SpO2 100 % 09/13/19 1400  Vitals shown include unvalidated device data.  Last Pain:  Vitals:   09/13/19 1305  TempSrc: Temporal  PainSc: 0-No pain      Patients Stated Pain Goal: 2 (50/03/70 4888)  Complications: No apparent anesthesia complications

## 2019-09-13 NOTE — Anesthesia Preprocedure Evaluation (Addendum)
Anesthesia Evaluation  Patient identified by MRN, date of birth, ID band Patient awake    Reviewed: Allergy & Precautions, NPO status , Patient's Chart, lab work & pertinent test results  Airway Mallampati: III  TM Distance: >3 FB Neck ROM: Full    Dental  (+) Missing   Pulmonary Current Smoker and Patient abstained from smoking.,    Pulmonary exam normal breath sounds clear to auscultation       Cardiovascular negative cardio ROS Normal cardiovascular exam Rhythm:Regular Rate:Normal  ECG: ST, rate 106   Neuro/Psych negative neurological ROS  negative psych ROS   GI/Hepatic negative GI ROS, (+) Cirrhosis   ascites    ,   Endo/Other  negative endocrine ROS  Renal/GU negative Renal ROS     Musculoskeletal negative musculoskeletal ROS (+)   Abdominal   Peds  Hematology  (+) anemia ,   Anesthesia Other Findings cirrhosis  Reproductive/Obstetrics                            Anesthesia Physical Anesthesia Plan  ASA: III  Anesthesia Plan: MAC   Post-op Pain Management:    Induction: Intravenous  PONV Risk Score and Plan: 0 and Propofol infusion and Treatment may vary due to age or medical condition  Airway Management Planned: Nasal Cannula  Additional Equipment:   Intra-op Plan:   Post-operative Plan:   Informed Consent: I have reviewed the patients History and Physical, chart, labs and discussed the procedure including the risks, benefits and alternatives for the proposed anesthesia with the patient or authorized representative who has indicated his/her understanding and acceptance.     Dental advisory given  Plan Discussed with: CRNA  Anesthesia Plan Comments:        Anesthesia Quick Evaluation

## 2019-09-14 ENCOUNTER — Encounter: Payer: Self-pay | Admitting: *Deleted

## 2019-09-14 ENCOUNTER — Other Ambulatory Visit: Payer: Self-pay | Admitting: *Deleted

## 2019-09-14 ENCOUNTER — Telehealth: Payer: Self-pay | Admitting: *Deleted

## 2019-09-14 ENCOUNTER — Other Ambulatory Visit: Payer: Self-pay | Admitting: Physician Assistant

## 2019-09-14 ENCOUNTER — Encounter (HOSPITAL_COMMUNITY): Payer: Self-pay | Admitting: Gastroenterology

## 2019-09-14 ENCOUNTER — Telehealth: Payer: Self-pay | Admitting: Gastroenterology

## 2019-09-14 DIAGNOSIS — D509 Iron deficiency anemia, unspecified: Secondary | ICD-10-CM

## 2019-09-14 LAB — CULTURE, BLOOD (ROUTINE X 2)
Culture: NO GROWTH
Culture: NO GROWTH
Special Requests: ADEQUATE

## 2019-09-14 LAB — BASIC METABOLIC PANEL
Anion gap: 6 (ref 5–15)
BUN: 10 mg/dL (ref 8–23)
CO2: 27 mmol/L (ref 22–32)
Calcium: 7.5 mg/dL — ABNORMAL LOW (ref 8.9–10.3)
Chloride: 101 mmol/L (ref 98–111)
Creatinine, Ser: 0.67 mg/dL (ref 0.61–1.24)
GFR calc Af Amer: >60 mL/min (ref >60)
GFR calc non Af Amer: >60 mL/min (ref >60)
Glucose, Bld: 125 mg/dL — ABNORMAL HIGH (ref 70–99)
Potassium: 3.4 mmol/L — ABNORMAL LOW (ref 3.5–5.1)
Sodium: 134 mmol/L — ABNORMAL LOW (ref 135–145)

## 2019-09-14 LAB — HEPATITIS C GENOTYPE

## 2019-09-14 LAB — HEPATITIS B SURFACE ANTIBODY,QUALITATIVE: Hep B S Ab: NONREACTIVE

## 2019-09-14 NOTE — Plan of Care (Signed)
  Problem: Education: Goal: Knowledge of General Education information will improve Description: Including pain rating scale, medication(s)/side effects and non-pharmacologic comfort measures Outcome: Progressing   Problem: Health Behavior/Discharge Planning: Goal: Ability to manage health-related needs will improve Outcome: Progressing   Problem: Clinical Measurements: Goal: Ability to maintain clinical measurements within normal limits will improve Outcome: Progressing Goal: Will remain free from infection Outcome: Progressing Goal: Respiratory complications will improve Outcome: Progressing Goal: Cardiovascular complication will be avoided Outcome: Progressing   Problem: Activity: Goal: Risk for activity intolerance will decrease Outcome: Progressing   Problem: Nutrition: Goal: Adequate nutrition will be maintained Outcome: Progressing   Problem: Coping: Goal: Level of anxiety will decrease Outcome: Progressing   Problem: Elimination: Goal: Will not experience complications related to bowel motility Outcome: Progressing   

## 2019-09-14 NOTE — Telephone Encounter (Signed)
-----   Message from Algernon Huxley, RN sent at 09/14/2019  9:42 AM EST ----- Regarding: FW: Dar. Danis follow up patient  ----- Message ----- From: Yetta Flock, MD Sent: 09/14/2019   9:36 AM EST To: Algernon Huxley, RN Subject: Dar. Danis follow up patient                   Vaughan Basta this patient will need a BMET next Monday and follow up appointment with Dr. Loletha Carrow or APP within 2-3 weeks if possible. Thanks

## 2019-09-14 NOTE — Progress Notes (Signed)
PROGRESS NOTE  Donald Aguilar  DOB: 18-Mar-1951  PCP: Patient, No Pcp Per [id number]  DOA: 09/09/2019  LOS: 5 days   Chief Complaint  Patient presents with  . Abdominal Pain   Brief narrative: 68 year old with no known past medical history presented with abdominal pain and distention.  He reports 1 month history of abdominal distention with increased shortness of breath and intermittent cough.  He reports early satiety. Patient was found to have new onset decompensated cirrhosis, ground glass opacity on CT scan. Upper abdominal varices on CT scan.  Hepatitis C antibody was positive concern for chronic hepatitis C as etiology of liver failure awaiting confirmatory test. GI was consulted. Recommended paracentesis.  Patient underwent paracentesis on 09/10/2019 yielding 6 L of fluid.  GI recommended repeating paracentesis for 09/12/2019.  Patient underwent paracentesis on 1123/2020 yielding 5 L of ascites fluid.  Subjective: Patient was seen and examined this morning.  Elderly [place] male. Sitting up in bed.  Large distended belly.  Not in distress.    Assessment/Plan: New diagnosis of decompensated cirrhosis with positive hepatitis C antibody Massive ascites -Underwent paracentesis x 2 (11-21) ( 11-23) 5 and 6 L of fluids removed.  Seems to continue to have abdominal distention from ascites.  I ordered for paracentesis again today. -Hepatitis C genotype pending, hepatitis C RNA quantitative pending. -Alpha-fetoprotein serum 3.3. -Ascites fluid; cell count 51, monocyte 24.  Culture no growth.  Cytology did not show any malignant cells. -GI recommended to hold Corgard. -Started  Lasix and spironolactone 11/23.. -Patient received  IV albumin post paracentesis.  -EGD 11/24 showed grade 2 esophageal varices with no bleeding and no stigmata of recent bleeding, erosive gastropathy, portal hypertensive gastropathy, normal duodenum. -Hepatitis B core antibody reactive.   Dyspnea: Might be  related to diaphragmatic compression in the setting of severe ascites -CT does show groundglass opacity, COVID-19 negative. -Pneumonia less likely, no leukocytosis normal procalcitonin level. Discontinue antibiotics and observe. -Afebrile.   Hypotension: Post paracentesis. He received albumin with improvement of hypotension. Resolved. Tolerating diuretics so far.   Hypomagnesemia; resolved.   DVT prophylaxis: SCDs Code Status: Full code Family Communication:  No at bedside Disposition Plan:  Repeat paracentesis ordered.  Repeat labs tomorrow.  Hopefully home tomorrow.   Antimicrobials: Anti-infectives (From admission, onward)   Start     Dose/Rate Route Frequency Ordered Stop   09/10/19 1700  azithromycin (ZITHROMAX) 500 mg in sodium chloride 0.9 % 250 mL IVPB  Status:  Discontinued     500 mg 250 mL/hr over 60 Minutes Intravenous Every 24 hours 09/09/19 1745 09/12/19 1638   09/10/19 1700  cefTRIAXone (ROCEPHIN) 1 g in sodium chloride 0.9 % 100 mL IVPB  Status:  Discontinued     1 g 200 mL/hr over 30 Minutes Intravenous Every 24 hours 09/09/19 1745 09/12/19 1638   09/09/19 1615  cefTRIAXone (ROCEPHIN) 1 g in sodium chloride 0.9 % 100 mL IVPB     1 g 200 mL/hr over 30 Minutes Intravenous  Once 09/09/19 1611 09/09/19 2002   09/09/19 1615  azithromycin (ZITHROMAX) 500 mg in sodium chloride 0.9 % 250 mL IVPB     500 mg 250 mL/hr over 60 Minutes Intravenous  Once 09/09/19 1611 09/09/19 1808      Diet Order            DIET SOFT Room service appropriate? Yes; Fluid consistency: Thin; Fluid restriction: 1800 mL Fluid  Diet effective now  Infusions:    Scheduled Meds: . docusate sodium  100 mg Oral BID  . furosemide  20 mg Oral Daily  . pantoprazole  40 mg Oral Daily  . spironolactone  50 mg Oral Daily    PRN meds: acetaminophen **OR** acetaminophen, lidocaine (PF), ondansetron **OR** ondansetron (ZOFRAN) IV   Objective: Vitals:   09/14/19 0406  09/14/19 1354  BP: 96/70 92/61  Pulse: 86 86  Resp: 14 15  Temp: 98.4 F (36.9 C) 98.3 F (36.8 C)  SpO2: 94% 96%    Intake/Output Summary (Last 24 hours) at 09/14/2019 1439 Last data filed at 09/14/2019 1300 Gross per 24 hour  Intake 570 ml  Output -  Net 570 ml   Filed Weights   09/12/19 0500 09/13/19 1305 09/14/19 0407  Weight: 67.3 kg 67.3 kg 63.3 kg   Weight change:  Body mass index is 24.72 kg/m.   Physical Exam: General exam: Appears calm and comfortable.  Skin: No rashes, lesions or ulcers. HEENT: Atraumatic, normocephalic, supple neck, no obvious bleeding Lungs: Clear to oscillation bilaterally CVS: Regular rate and rhythm, no murmur GI/Abd distended from ascites, nontender, bowel sound present CNS: Alert, awake, oriented x3 Psychiatry: Mood appropriate Extremities: 1+ bilateral pedal edema, no calf tenderness  Data Review: I have personally reviewed the laboratory data and studies available.  Recent Labs  Lab 09/09/19 1115 09/10/19 0223 09/12/19 0507  WBC 7.9 7.4 7.5  NEUTROABS 5.3  --   --   HGB 13.2 11.2* 11.8*  HCT 40.8 34.2* 35.4*  MCV 100.0 96.9 97.3  PLT 222 195 187   Recent Labs  Lab 09/10/19 1058 09/11/19 0237 09/12/19 0507 09/13/19 0212 09/13/19 0817 09/14/19 0150  NA 139 137 137 136  --  134*  K 4.0 3.4* 3.8 3.7  --  3.4*  CL 104 103 103 104  --  101  CO2 26 27 27 26   --  27  GLUCOSE 117* 98 99 102*  --  125*  BUN 7* 8 12 10   --  10  CREATININE 0.82 0.59* 0.72 0.68  --  0.67  CALCIUM 8.2* 7.7* 8.1* 7.9*  --  7.5*  MG  --  1.6*  --   --  1.7  --     Terrilee Croak, MD  [facility] 09/14/2019

## 2019-09-14 NOTE — Progress Notes (Addendum)
Progress Note   Subjective  Patient states he is feeling okay. Ate breakfast. No pain. He has abdominal distension but improved from admission. LE edema also improving. I questioned him about his alcohol use, he states he has recently been drinking "one large budlight" every day, in contrast to what he told us upon admission.    Objective   Vital signs in last 24 hours: Temp:  [97.4 F (36.3 C)-98.4 F (36.9 C)] 98.4 F (36.9 C) (11/25 0406) Pulse Rate:  [77-93] 86 (11/25 0406) Resp:  [13-22] 14 (11/25 0406) BP: (92-112)/(64-80) 96/70 (11/25 0406) SpO2:  [94 %-100 %] 94 % (11/25 0406) Weight:  [63.3 kg-67.3 kg] 63.3 kg (11/25 0407) Last BM Date: 09/10/19 General:    male in NAD Heart:  Regular rate and rhythm; no murmurs Lungs: Respirations even and unlabored Abdomen:  Soft, nontender, distended with ascites.  Extremities:  (+) 1 pitting edema LE B Neurologic:  Alert and oriented,  grossly normal neurologically. Psych:  Cooperative. Normal mood and affect.  Intake/Output from previous day: 11/24 0701 - 11/25 0700 In: 500 [P.O.:300; I.V.:200] Out: -  Intake/Output this shift: Total I/O In: 150 [P.O.:150] Out: -   Lab Results: Recent Labs    09/12/19 0507  WBC 7.5  HGB 11.8*  HCT 35.4*  PLT 187   BMET Recent Labs    09/12/19 0507 09/13/19 0212 09/14/19 0150  NA 137 136 134*  K 3.8 3.7 3.4*  CL 103 104 101  CO2 27 26 27   GLUCOSE 99 102* 125*  BUN 12 10 10   CREATININE 0.72 0.68 0.67  CALCIUM 8.1* 7.9* 7.5*   LFT Recent Labs    09/12/19 0507  PROT 6.9  ALBUMIN 2.2*  AST 46*  ALT 22  ALKPHOS 52  BILITOT 1.8*   PT/INR Recent Labs    09/12/19 0507  LABPROT 18.3*  INR 1.5*    Studies/Results: Ir Paracentesis  Result Date: 09/12/2019 INDICATION: Patient with recently diagnosed decompensated cirrhosis, recurrent ascites. Request for diagnostic and therapeutic paracentesis today in IR with 5 L maximum. EXAM: ULTRASOUND GUIDED DIAGNOSTIC  AND THERAPEUTIC PARACENTESIS MEDICATIONS: 10 mL 1% lidocaine COMPLICATIONS: None immediate. PROCEDURE: Informed written consent was obtained from the patient after a discussion of the risks, benefits and alternatives to treatment. A timeout was performed prior to the initiation of the procedure. Initial ultrasound scanning demonstrates a large amount of ascites within the right lower abdominal quadrant. The right lower abdomen was prepped and draped in the usual sterile fashion. 1% lidocaine was used for local anesthesia. Following this, a 6 Fr Safe-T-Centesis catheter was introduced. An ultrasound image was saved for documentation purposes. The paracentesis was performed. The catheter was removed and a dressing was applied. The patient tolerated the procedure well without immediate post procedural complication. FINDINGS: A total of approximately 5.0 L of light, clear yellow fluid was removed. Samples were sent to the laboratory as requested by the clinical team. IMPRESSION: Successful ultrasound-guided paracentesis yielding 5.0 liters of peritoneal fluid. Read by Candiss Norse, [name] Electronically Signed   By: Aletta Edouard M.D.   On: 09/12/2019 12:51       Assessment / Plan:   68 y/o male admitted with decompensated cirrhosis, newly diagnosed, massive ascites, LE edema, sarcopenia. CT with contrast shows no evidence of HCC but he does have esophageal varices noted on CT and EGD, also with portal hypertensive gastritis. Large volume paracentesis done, no SBP, SAAG appears > 1.1. He has tested positive  for Hep C (confirmatory viral load / genotype pending), also hep B core AB positive, so he had exposure to this previously but surface antigen is negative. Will check hep B DNA level and hep B surface antibody. Would screen for HIV given these findings to ensure negative, as well as quantiferon gold to ensure no TB given where he is from. On additional history today, he admits to daily alcohol use. His  cirrhosis his likel multifactorial and we discussed this for a bit, although his insight into this appear to be poor.  Overall, improved from admission following large volume paracentesis / starting low dose diuretics. Renal function stable, slowly increase diuretics if tolerated in the coming days. He does have moderate sized varices and those need to be addressed at some point but given massive ascites would hold on beta blockade for now, can consider as outpatient if renal function stable, ascites controlled, and he proves he is compliant.  Recommend: - continue present dose of diuretics today. If he remains inpatient, tomorrow, if renal function stable., increase lasix to 40mg  / day and aldactone to 100mg . I do think he looks okay for discharge today, but I do have concerns about his compliance and discussed this with the primary service. He will need close monitoring of renal function / electrolytes as outpatient. If he does go home today I would continue present dosing of diuretics at discharge that he is on today and would NOT increase them yet, repeat BMET on Monday and our office can coordinate follow up plans / adjust diuretics accordingly - screen for HIV / TB today, hep B DNA / surface antibody - low Na diet - complete alcohol abstinence - follow up HCV RNA / genotype, he will need therapy for this as outpatient - holding off on nonselective beta blockade for varices given the ascites / renal function monitoring issue. Over time will address this if ascites controlled and renal function stable. - continue protonix for gastritis / erosions noted on EGD  I have spoken with Dr. Pietro Cassis, hospitalist, about the plan, he will discuss timing of discharge with the patient.   [place] Cellar, MD [facility]

## 2019-09-14 NOTE — Telephone Encounter (Signed)
This RN attempted to call the patient the inform him of the need to get blood work complete on Monday (BMP labs in [place]) and his f/u appointment scheduled with AE on 09/28/2019 at 1:30 pm. The language line was called and this RN was transferred 5 times and spoke with 5 different interpreters, each one telling this RN that the [employer] language did not exist and was in fact a region in a country. This RN called the patient with a [place] interpreter which I was last transferred to, unable to LM on both the patient's phone and his niece's phone due to VM's not being set up. Letter sent to patient for reminder appt.

## 2019-09-15 ENCOUNTER — Inpatient Hospital Stay (HOSPITAL_COMMUNITY): Payer: Medicare Other

## 2019-09-15 ENCOUNTER — Other Ambulatory Visit (HOSPITAL_COMMUNITY): Payer: Medicare Other

## 2019-09-15 LAB — CULTURE, BODY FLUID W GRAM STAIN -BOTTLE: Culture: NO GROWTH

## 2019-09-15 LAB — BASIC METABOLIC PANEL
Anion gap: 5 (ref 5–15)
BUN: 9 mg/dL (ref 8–23)
CO2: 27 mmol/L (ref 22–32)
Calcium: 7.6 mg/dL — ABNORMAL LOW (ref 8.9–10.3)
Chloride: 103 mmol/L (ref 98–111)
Creatinine, Ser: 0.64 mg/dL (ref 0.61–1.24)
GFR calc Af Amer: >60 mL/min (ref >60)
GFR calc non Af Amer: >60 mL/min (ref >60)
Glucose, Bld: 116 mg/dL — ABNORMAL HIGH (ref 70–99)
Potassium: 3.5 mmol/L (ref 3.5–5.1)
Sodium: 135 mmol/L (ref 135–145)

## 2019-09-15 LAB — HEPATITIS B DNA, ULTRAQUANTITATIVE, PCR
HBV DNA SERPL PCR-ACNC: NOT DETECTED IU/mL
HBV DNA SERPL PCR-LOG IU: UNDETERMINED log10 IU/mL

## 2019-09-15 LAB — MAGNESIUM: Magnesium: 1.8 mg/dL (ref 1.7–2.4)

## 2019-09-15 IMAGING — US US PARACENTESIS
1 series · 6 of 6 positions shown · non-contrast
Comparison: none

INDICATION: Patient with history of cirrhosis, hepatitis-C, recurrent ascites;
request made for therapeutic paracentesis.

[Series 1: us paracentesis · 6 of 6 slices shown]
[im 1/6]
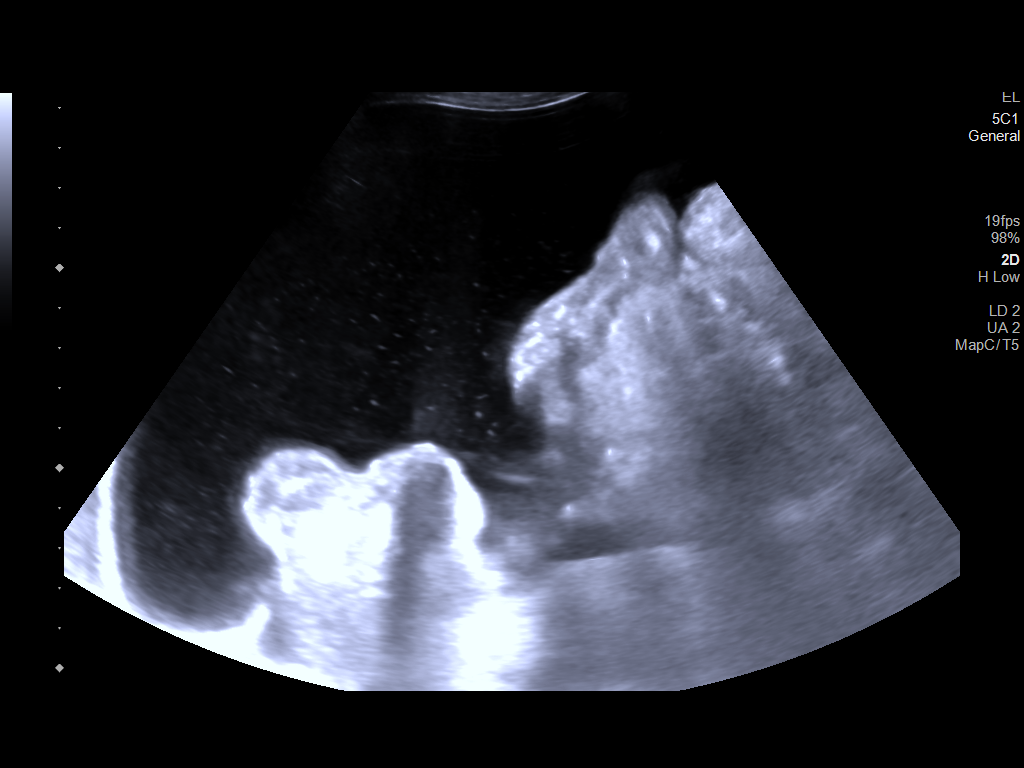
[im 2/6]
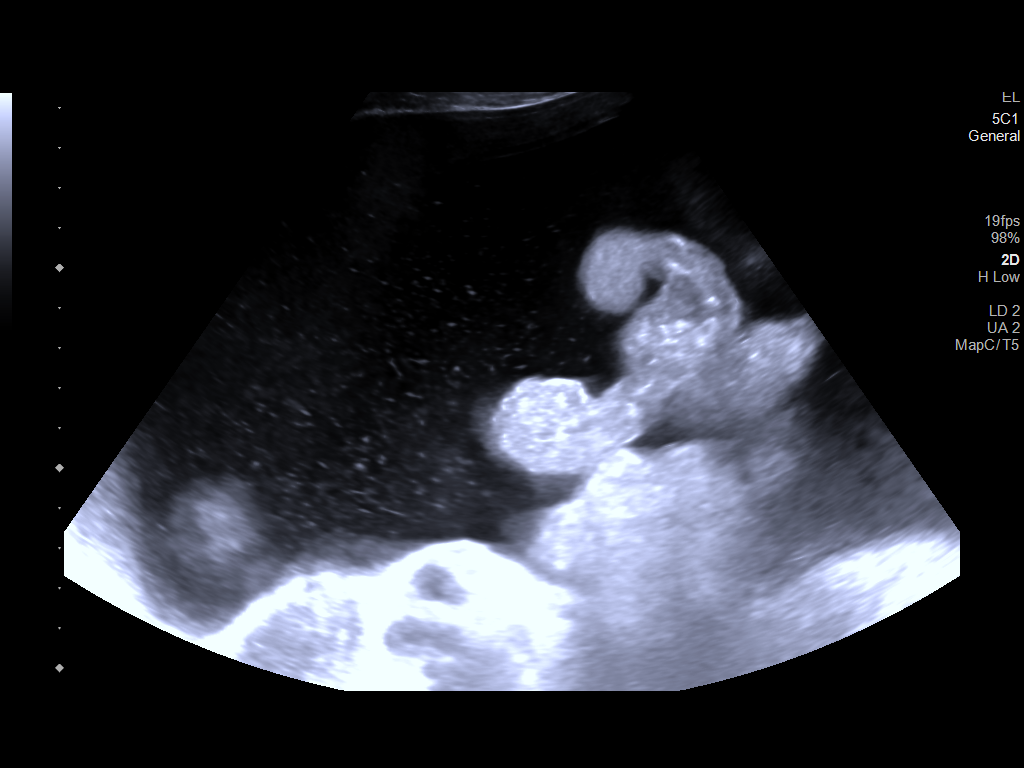
[im 3/6]
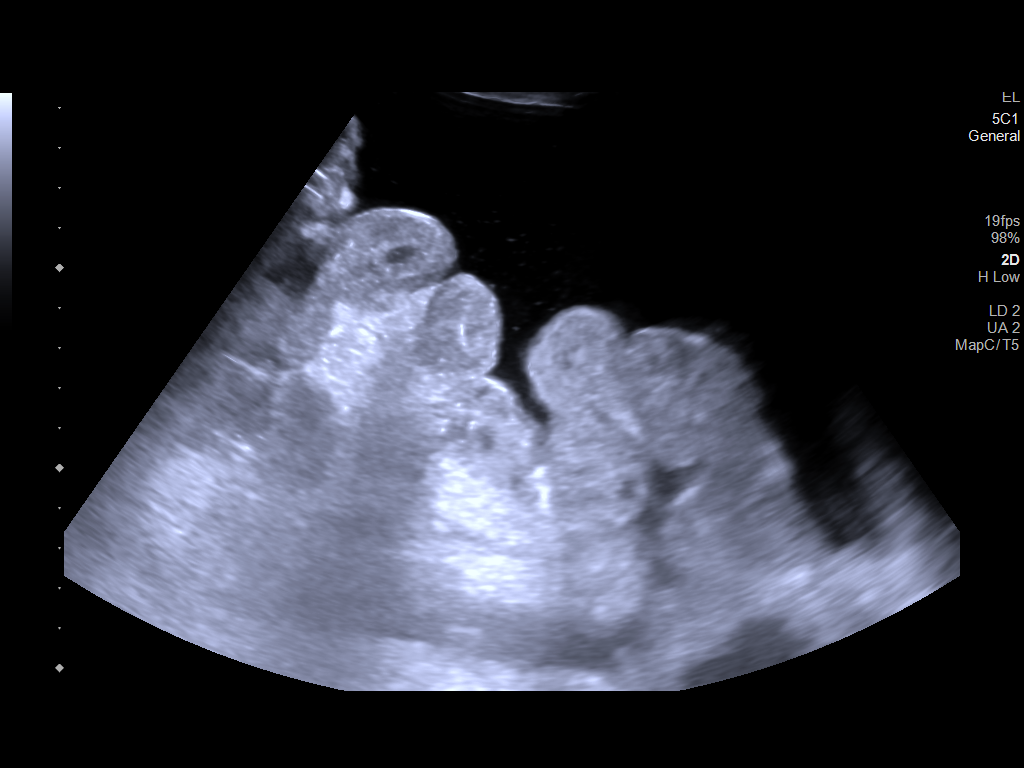
[im 4/6]
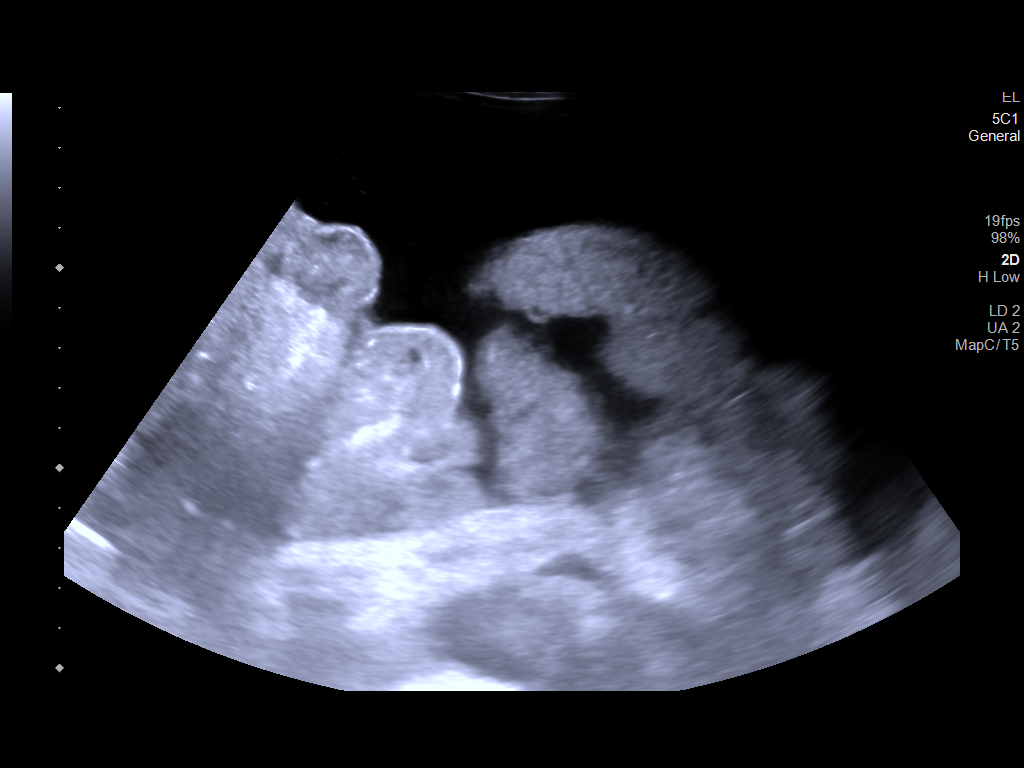
[im 5/6]
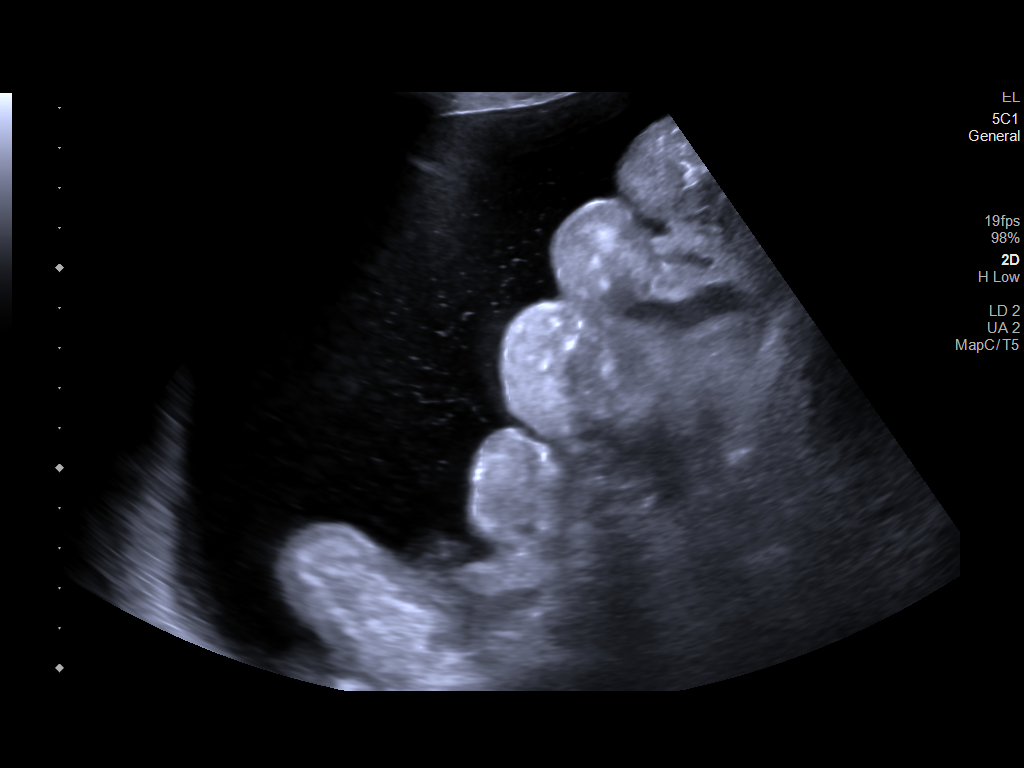
[im 6/6]
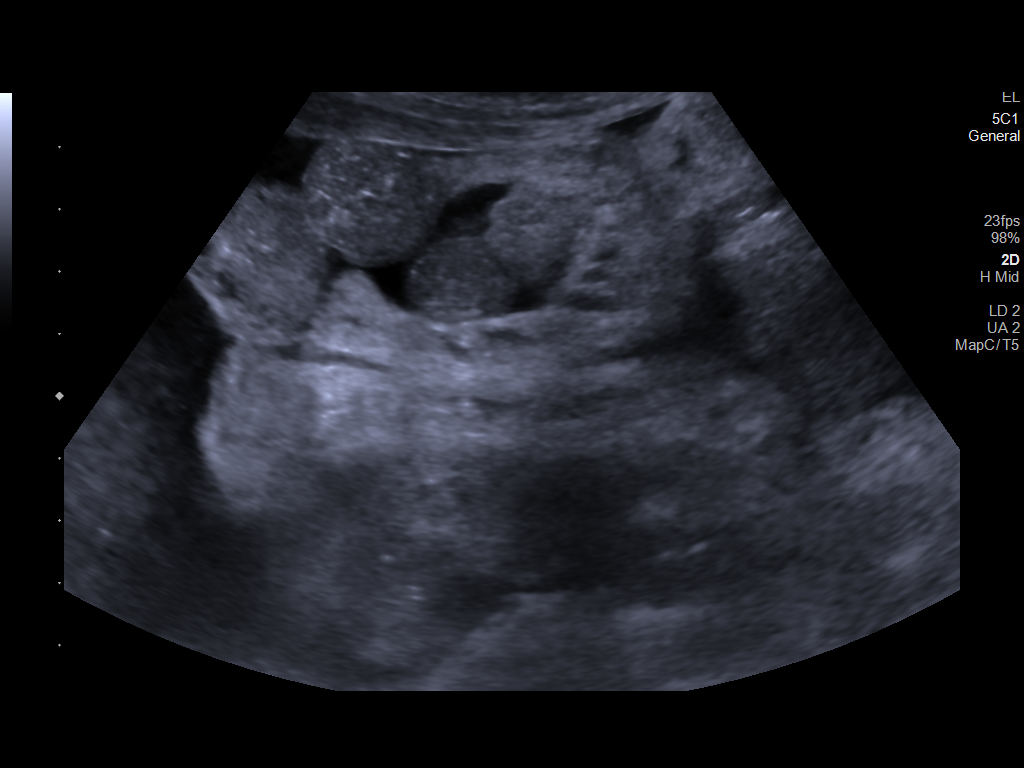

[6 of 6 positions shown; findings below may reference images not displayed]

EXAM:
ULTRASOUND GUIDED THERAPEUTIC PARACENTESIS

MEDICATIONS:
None

COMPLICATIONS:
None immediate.

PROCEDURE:
Informed written consent was obtained from the patient after a
discussion of the risks, benefits and alternatives to treatment. A
timeout was performed prior to the initiation of the procedure.

Initial ultrasound scanning demonstrates a moderate to large amount
of ascites within the right lower abdominal quadrant. The right
lower abdomen was prepped and draped in the usual sterile fashion.
1% lidocaine was used for local anesthesia.

Following this, a 19 gauge, 7-cm, Yueh catheter was introduced. An
ultrasound image was saved for documentation purposes. The
paracentesis was performed. The catheter was removed and a dressing
was applied. The patient tolerated the procedure well without
immediate post procedural complication.
FINDINGS: A total of approximately 4.6 liters of yellow fluid was removed.
IMPRESSION: Successful ultrasound-guided therapeutic paracentesis yielding
liters of peritoneal fluid.

## 2019-09-15 MED ORDER — LIDOCAINE HCL (PF) 1 % IJ SOLN
INTRAMUSCULAR | Status: AC
  Filled 2019-09-15: qty 30

## 2019-09-15 MED ORDER — PANTOPRAZOLE SODIUM 40 MG PO TBEC: 40.0000 mg | DELAYED_RELEASE_TABLET | Freq: Every day | ORAL | 0 refills | Status: DC

## 2019-09-15 MED ORDER — SPIRONOLACTONE 50 MG PO TABS: 25.0000 mg | ORAL_TABLET | Freq: Every day | ORAL | 0 refills | Status: DC

## 2019-09-15 MED ORDER — FUROSEMIDE 20 MG PO TABS: 20.0000 mg | ORAL_TABLET | Freq: Every day | ORAL | 0 refills | Status: DC

## 2019-09-15 NOTE — Procedures (Signed)
Ultrasound-guided therapeutic paracentesis performed yielding 4.6 liters of yellow fluid. No immediate complications. EBL none.   

## 2019-09-15 NOTE — Discharge Summary (Addendum)
Physician Discharge Summary  Donald Aguilar [id number] DOB: [birth year] DOA: 09/09/2019  PCP: Patient, No Pcp Per  Admit date: 09/09/2019 Discharge date: 09/15/2019  Admitted From: Home Discharge disposition: Home   Code Status: Full Code  Diet Recommendation: Low-sodium diet   Recommendations for Outpatient Follow-Up:   1. Follow-up with GI as an outpatient.  Discharge Diagnosis:   Principal Problem:   Cirrhosis of liver (HCC) Active Problems:   Ascites   Shortness of breath   Abnormal CT of the abdomen   Erosive gastritis  History of Present Illness / Brief narrative:  Patient is a 68 year old with no known past medical history presented with abdominal pain and distention. He reports 1 month history of abdominal distention with increased shortness of breath and intermittent cough. He reports early satiety. Patient was found to have new onset decompensated cirrhosis, ground glass opacity on CT scan. Upper abdominal varices on CT scan. Hepatitis C antibody was positive raising concern for chronic hepatitis C as etiology of liver failure awaiting confirmatory test. GI was consulted. Recommended paracentesis.  Hospital Course:  New diagnosis of decompensated cirrhosis with positive hepatitis C antibody Massive ascites -Hepatitis B core antibodypositive but surface antigen negative -Hepatitis C genotype 1a, hepatitis C RNA quantitative pending. -Alpha-fetoprotein serum 3.3. -Ascites fluid; cell count 51, monocyte 24. Culture no growth. Cytology did not show any malignant cells.  - Patient underwent paracentesis 3 times during this stay.   6 L on 09/10/2019 5 L on 09/12/2019.  4.6 L on 09/15/2019 -Patient received IV albumin post paracentesis.  -EGD 11/24 showed grade 2 esophageal varices with no bleeding and no stigmata of recent bleeding, erosive gastropathy, portal hypertensive gastropathy, normal duodenum. --GI recommended to hold nadolol because of low blood  pressure. -StartedLasix and spironolactone.  Renal function normal. -Blood pressure is running low.  This morning, was given Lasix but had to hold spironolactone.  I would discharge him on Lasix 20 mg daily and spironolactone at a reduced dose of 25 mg daily.  Dyspnea: Might be related to diaphragmatic compression in the setting of severe ascites -CT does show groundglass opacity, COVID-19 negative. -Pneumonia less likely, no leukocytosis normal procalcitonin level.Discontinued antibiotics and observed.  No new symptoms. -Afebrile.  Stable for discharge to home today.  Follow-up with GI as an outpatient.  Subjective:  Seen and examined this morning.  Not in distress.  Feels better after paracentesis today.  Ready to go home today.  Discharge Exam:   Vitals:   09/15/19 0828 09/15/19 0842 09/15/19 0911 09/15/19 1000  BP: 101/67 104/78 96/67 98/62   Pulse:      Resp:      Temp:      TempSrc:      SpO2:      Weight:      Height:        Body mass index is 25.07 kg/m.  General exam: Appears calm and comfortable.  Skin: No rashes, lesions or ulcers. HEENT: Atraumatic, normocephalic, supple neck, no obvious bleeding Lungs: Clear to auscultation bilaterally CVS: Regular rate and rhythm, no murmur GI/Abd soft, nontender, nondistended, bowel sound present CNS: Alert, awake, oriented x3 Psychiatry: Mood appropriate Extremities: Trace bilateral pedal edema.  No calf tenderness  Discharge Instructions:  Wound care: None Discharge Instructions    Diet - low sodium heart healthy   Complete by: As directed    Increase activity slowly   Complete by: As directed      Follow-up Information    [name], [name], [name]  Follow up.   Specialty: Family Medicine Why: Appointment October 05, 2019 at 1:30 pm  Contact information: [place] [address] [phone]        [name], [name] Follow up on 09/28/2019.   Specialty: Gastroenterology Why: 1:30 PM  follow up in office for liver problems.   Contact information: [place] [address] [phone]        [facility] Follow up on 09/19/2019.   Why:  go to lab in the basement at above address, hours are 7:30 to 5:30 PM for blood work Sport and exercise psychologist information: [place] [phone]         Allergies as of 09/15/2019   No Known Allergies     Medication List    TAKE these medications   furosemide 20 MG tablet Commonly known as: LASIX Take 1 tablet (20 mg total) by mouth daily. Start taking on: September 16, 2019   pantoprazole 40 MG tablet Commonly known as: PROTONIX Take 1 tablet (40 mg total) by mouth daily. Start taking on: September 16, 2019   spironolactone 50 MG tablet Commonly known as: ALDACTONE Take 0.5 tablets (25 mg total) by mouth daily. Start taking on: September 16, 2019       Time coordinating discharge: 35 minutes  The results of significant diagnostics from this hospitalization (including imaging, microbiology, ancillary and laboratory) are listed below for reference.    Procedures and Diagnostic Studies:   Ct Abdomen Pelvis W Contrast  Result Date: 09/09/2019 CLINICAL DATA:  Abdominal pain and distention. EXAM: CT ABDOMEN AND PELVIS WITH CONTRAST TECHNIQUE: Multidetector CT imaging of the abdomen and pelvis was performed using the standard protocol following bolus administration of intravenous contrast. CONTRAST:  147mL OMNIPAQUE IOHEXOL 300 MG/ML  SOLN COMPARISON:  None. FINDINGS: Lower chest: Ground-glass airspace opacities are noted in the left upper lobe lingula and right middle lobe. More confluent opacity is noted in the dependent right middle lobe adjacent to the hemidiaphragm and the lower lobes. Small bilateral pleural effusions. Hepatobiliary: Morphologic changes of cirrhosis with central volume loss and liver nodularity. No discrete liver mass. There are several calcifications  consistent with healed granuloma. Increased attenuation material is noted in the gallbladder neck consistent with small stones or sludge. No gallbladder wall thickening. No evidence of acute cholecystitis. No bile duct dilation. Pancreas: Unremarkable. No pancreatic ductal dilatation or surrounding inflammatory changes. Spleen: Normal in size without focal abnormality. Adrenals/Urinary Tract: Adrenal glands are unremarkable. Kidneys are normal, without renal calculi, focal lesion, or hydronephrosis. Bladder is unremarkable. Stomach/Bowel: Stomach is unremarkable. Small bowel and colon are normal in caliber. There are fluid levels noted along the small bowel. No convincing wall thickening or inflammation. Normal appendix. Vascular/Lymphatic: There are upper abdominal varices, which are prominent along the lesser curvature of the stomach contiguous with paraesophageal varices. Aortic atherosclerosis. No aneurysm. Normal enhancement of the portal, splenic and superior mesenteric veins. No enlarged lymph nodes. Reproductive: Prostate normal in size. Left inguinal canal is distended with fluid that extends into the scrotum. Other: Large amount of ascites which distends the abdomen. Diffuse subcutaneous edema. Musculoskeletal: No fracture or acute finding.  No bone lesion. IMPRESSION: 1. Ground-glass opacities in the lung bases consistent with multifocal pneumonia. This appearance can be seen with COVID-19 pneumonia. 2. Large amount of ascites which distends the abdomen. This is due to cirrhosis with portal venous hypertension. 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 3. No visualized liver mass.  4. Small effusions and diffuse subcutaneous edema. 5. No evidence of a bowel obstruction. Small-bowel air-fluid levels is consistent with a mild adynamic ileus. Electronically Signed   By: Lajean Manes M.D.   On: 09/09/2019 15:51   US Paracentesis  Result Date: 09/10/2019 INDICATION: Patient with  abdominal distention, newly diagnosed cirrhosis with ascites. Request is made for diagnostic and therapeutic paracentesis. EXAM: ULTRASOUND GUIDED DIAGNOSTIC AND THERAPEUTIC PARACENTESIS MEDICATIONS: 10 mL 1% lidocaine COMPLICATIONS: None immediate. PROCEDURE: Informed written consent was obtained from the patient after a discussion of the risks, benefits and alternatives to treatment. A timeout was performed prior to the initiation of the procedure. Initial ultrasound scanning demonstrates a large amount of ascites within the right lower abdominal quadrant. The right lower abdomen was prepped and draped in the usual sterile fashion. 1% lidocaine was used for local anesthesia. Following this, a 19 gauge, 7-cm, Yueh catheter was introduced. An ultrasound image was saved for documentation purposes. The paracentesis was performed. The catheter was removed and a dressing was applied. The patient tolerated the procedure well without immediate post procedural complication. FINDINGS: A total of approximately 6.0 liters of yellow fluid was removed. Procedure was stopped prior to removal of all fluid as this was patient's first paracentesis. Samples were sent to the laboratory as requested by the clinical team. IMPRESSION: Successful ultrasound-guided paracentesis yielding 6.0 liters of peritoneal fluid. Read by: Brynda Greathouse [name] Electronically Signed   By: Jerilynn Mages.  Shick M.D.   On: 09/10/2019 10:32   Dg Chest Portable 1 View  Result Date: 09/09/2019 CLINICAL DATA:  Shortness of breath. EXAM: PORTABLE CHEST 1 VIEW COMPARISON:  None. FINDINGS: Mild cardiomegaly is noted. No pneumothorax or pleural effusion is noted. Mild bibasilar subsegmental atelectasis is noted. Right lower lobe airspace opacity is noted concerning for possible pneumonia. Old left clavicular fracture is noted. IMPRESSION: Mild bibasilar subsegmental atelectasis. Right lower lobe airspace opacity is noted concerning for possible pneumonia. Electronically  Signed   By: Marijo Conception M.D.   On: 09/09/2019 12:56     Labs:   Basic Metabolic Panel: Recent Labs  Lab 09/11/19 0237 09/12/19 0507 09/13/19 0212 09/13/19 0817 09/14/19 0150 09/15/19 0233  NA 137 137 136  --  134* 135  K 3.4* 3.8 3.7  --  3.4* 3.5  CL 103 103 104  --  101 103  CO2 27 27 26   --  27 27  GLUCOSE 98 99 102*  --  125* 116*  BUN 8 12 10   --  10 9  CREATININE 0.59* 0.72 0.68  --  0.67 0.64  CALCIUM 7.7* 8.1* 7.9*  --  7.5* 7.6*  MG 1.6*  --   --  1.7  --  1.8   GFR Estimated Creatinine Clearance: 73.1 mL/min (by C-G formula based on SCr of 0.64 mg/dL). Liver Function Tests: Recent Labs  Lab 09/09/19 1115 09/10/19 0223 09/12/19 0507  AST 64* 52* 46*  ALT 29 27 22   ALKPHOS 77 61 52  BILITOT 2.8* 1.9* 1.8*  PROT 8.2* 7.3 6.9  ALBUMIN 1.9* 1.7* 2.2*   Recent Labs  Lab 09/09/19 1115  LIPASE 44   Recent Labs  Lab 09/09/19 1233  AMMONIA 60*   Coagulation profile Recent Labs  Lab 09/09/19 1233 09/10/19 0223 09/12/19 0507  INR 1.6* 1.6* 1.5*    CBC: Recent Labs  Lab 09/09/19 1115 09/10/19 0223 09/12/19 0507  WBC 7.9 7.4 7.5  NEUTROABS 5.3  --   --   HGB 13.2  11.2* 11.8*  HCT 40.8 34.2* 35.4*  MCV 100.0 96.9 97.3  PLT 222 195 187   Cardiac Enzymes: No results for input(s): CKTOTAL, CKMB, CKMBINDEX, TROPONINI in the last 168 hours. BNP: Invalid input(s): POCBNP CBG: No results for input(s): GLUCAP in the last 168 hours. D-Dimer No results for input(s): DDIMER in the last 72 hours. Hgb A1c No results for input(s): HGBA1C in the last 72 hours. Lipid Profile No results for input(s): CHOL, HDL, LDLCALC, TRIG, CHOLHDL, LDLDIRECT in the last 72 hours. Thyroid function studies No results for input(s): TSH, T4TOTAL, T3FREE, THYROIDAB in the last 72 hours.  Invalid input(s): FREET3 Anemia work up No results for input(s): VITAMINB12, FOLATE, FERRITIN, TIBC, IRON, RETICCTPCT in the last 72 hours. Microbiology Recent Results (from the  past 240 hour(s))  Urine culture     Status: Abnormal   Collection Time: 09/09/19 11:13 AM   Specimen: Urine, Random  Result Value Ref Range Status   Specimen Description URINE, RANDOM  Final   Special Requests NONE  Final   Culture (A)  Final    <10,000 COLONIES/mL INSIGNIFICANT GROWTH Performed at [facility], [address] [place], [place] [address]    Report Status 09/11/2019 FINAL  Final  Blood culture (routine x 2)     Status: None   Collection Time: 09/09/19  5:07 PM   Specimen: BLOOD RIGHT HAND  Result Value Ref Range Status   Specimen Description BLOOD RIGHT HAND  Final   Special Requests   Final    BOTTLES DRAWN AEROBIC AND ANAEROBIC Blood Culture results may not be optimal due to an inadequate volume of blood received in culture bottles   Culture   Final    NO GROWTH 5 DAYS Performed at [facility], [place] [address] [place], [place] [address]    Report Status 09/14/2019 FINAL  Final  Blood culture (routine x 2)     Status: None   Collection Time: 09/09/19  5:07 PM   Specimen: BLOOD  Result Value Ref Range Status   Specimen Description BLOOD RIGHT ANTECUBITAL  Final   Special Requests   Final    BOTTLES DRAWN AEROBIC AND ANAEROBIC Blood Culture adequate volume   Culture   Final    NO GROWTH 5 DAYS Performed at [facility], [place] [address], [place], [place] [address]    Report Status 09/14/2019 FINAL  Final  SARS CORONAVIRUS 2 (TAT 6-24 HRS) Nasopharyngeal Nasopharyngeal Swab     Status: None   Collection Time: 09/09/19  6:16 PM   Specimen: Nasopharyngeal Swab  Result Value Ref Range Status   SARS Coronavirus 2 NEGATIVE NEGATIVE Final    Comment: (NOTE) SARS-CoV-2 target nucleic acids are NOT DETECTED. The SARS-CoV-2 RNA is generally detectable in upper and lower respiratory specimens during the acute phase of infection. Negative results do not preclude SARS-CoV-2 infection, do not rule out co-infections with other pathogens, and should not  be used as the sole basis for treatment or other patient management decisions. Negative results must be combined with clinical observations, patient history, and epidemiological information. The expected result is Negative. Fact Sheet for Patients: SugarRoll.be Fact Sheet for Healthcare Providers: https://www.woods-mathews.com/ This test is not yet approved or cleared by the [place] FDA and  has been authorized for detection and/or diagnosis of SARS-CoV-2 by FDA under an Emergency Use Authorization (EUA). This EUA will remain  in effect (meaning this test can be used) for the duration of the COVID-19  declaration under Section 56 4(b)(1) of the Act, 21 U.S.C. section 360bbb-3(b)(1), unless the authorization is terminated or revoked sooner. Performed at [facility], [place] [address], [place], [place] [address]   Gram stain     Status: None   Collection Time: 09/10/19 10:13 AM   Specimen: PATH Cytology Peritoneal fluid  Result Value Ref Range Status   Specimen Description PERITONEAL CYTO  Final   Special Requests NONE  Final   Gram Stain   Final    RARE WBC PRESENT, PREDOMINANTLY MONONUCLEAR NO ORGANISMS SEEN Performed at [facility], [employer] [address], [place], [place] [address]    Report Status 09/10/2019 FINAL  Final  Culture, body fluid-bottle     Status: None   Collection Time: 09/10/19 10:13 AM   Specimen: Peritoneal Washings  Result Value Ref Range Status   Specimen Description PERITONEAL  Final   Special Requests NONE  Final   Culture   Final    NO GROWTH 5 DAYS Performed at [facility], [address], [place], [place] [address]    Report Status 09/15/2019 FINAL  Final    Please note: You were cared for by a hospitalist during your hospital stay. Once you are discharged, your primary care physician will handle any further medical issues. Please note that NO REFILLS for any discharge medications will be  authorized once you are discharged, as it is imperative that you return to your primary care physician (or establish a relationship with a primary care physician if you do not have one) for your post hospital discharge needs so that they can reassess your need for medications and monitor your lab values.  Signed: Terrilee Croak  [facility] 09/15/2019, 12:55 PM

## 2019-09-15 NOTE — TOC Transition Note (Signed)
Transition of Care [facility]) - CM/SW Discharge Note   Patient Details  Name: Donald Aguilar MRN: [id number] Date of Birth: [birth year]  [facility]) CM/SW Contact:  Pollie Friar, RN Phone Number: 09/15/2019, 1:21 PM   Clinical Narrative:    Pt discharging home with self care. CM provided medication assistance so he can afford meds. CM reached out to patients niece and she can provide transport home. Bedside RN updated.    Final next level of care: Home/Self Care Barriers to Discharge: No Barriers Identified   Patient Goals and CMS Choice Patient states their goals for this hospitalization and ongoing recovery are:: to go home CMS Medicare.gov Compare Post Acute Care list provided to:: Patient    Discharge Placement                       Discharge Plan and Services   Discharge Planning Services: CM Consult, [facility], [place], Medication Assistance            DME Arranged: N/A         HH Arranged: NA          Social Determinants of Health (SDOH) Interventions     Readmission Risk Interventions No flowsheet data found.

## 2019-09-16 LAB — QUANTIFERON-TB GOLD PLUS

## 2019-09-16 LAB — HCV RNA QUANT: HCV Quantitative: UNDETERMINED IU/mL (ref >50)

## 2019-09-18 LAB — RNA QUALITATIVE: HIV 1 RNA Qualitative: 1

## 2019-09-18 LAB — HIV-1/2 AB - DIFFERENTIATION
HIV 1 Ab: NEGATIVE
HIV 2 Ab: NEGATIVE
Note: NEGATIVE

## 2019-09-19 LAB — SURGICAL PATHOLOGY

## 2019-09-21 NOTE — Congregational Nurse Program (Signed)
CN office visit with interpreter Donald Aguilar and friend Donald Aguilar who brought him and is helping with his medical needs.States he is feeling better since hospitalization but is still SOB when walking and continues to have ascites.  Taking medications as ordered.  Plans to go tomorrow for lab work at [facility] and has follow-up appointment there on 09/25/2019.  Mr. Donald Aguilar will take him to both. He is still waiting to hear from his Medicaid application.  If denied he will apply for [facility] financial assistance.  Donald Michaelis RN, Congregational Nurse [phone]

## 2019-09-22 ENCOUNTER — Other Ambulatory Visit (INDEPENDENT_AMBULATORY_CARE_PROVIDER_SITE_OTHER): Payer: Medicare Other

## 2019-09-22 DIAGNOSIS — D509 Iron deficiency anemia, unspecified: Secondary | ICD-10-CM

## 2019-09-22 LAB — BASIC METABOLIC PANEL
BUN: 8 mg/dL (ref 6–23)
CO2: 28 mEq/L (ref 19–32)
Calcium: 8.1 mg/dL — ABNORMAL LOW (ref 8.4–10.5)
Chloride: 98 mEq/L (ref 96–112)
Creatinine, Ser: 0.7 mg/dL (ref 0.40–1.50)
GFR: 112.56 mL/min (ref >60.00)
Glucose, Bld: 135 mg/dL — ABNORMAL HIGH (ref 70–99)
Potassium: 3.4 mEq/L — ABNORMAL LOW (ref 3.5–5.1)
Sodium: 132 mEq/L — ABNORMAL LOW (ref 135–145)

## 2019-09-28 ENCOUNTER — Ambulatory Visit (INDEPENDENT_AMBULATORY_CARE_PROVIDER_SITE_OTHER): Payer: Medicare Other | Admitting: Physician Assistant

## 2019-09-28 ENCOUNTER — Other Ambulatory Visit (INDEPENDENT_AMBULATORY_CARE_PROVIDER_SITE_OTHER): Payer: Medicare Other

## 2019-09-28 ENCOUNTER — Encounter: Payer: Self-pay | Admitting: Physician Assistant

## 2019-09-28 ENCOUNTER — Other Ambulatory Visit: Payer: Self-pay

## 2019-09-28 VITALS — BP 98/70 | HR 108 | Temp 97.8°F | Ht 65.0 in | Wt 145.0 lb

## 2019-09-28 DIAGNOSIS — B1920 Unspecified viral hepatitis C without hepatic coma: Secondary | ICD-10-CM | POA: Diagnosis not present

## 2019-09-28 DIAGNOSIS — R768 Other specified abnormal immunological findings in serum: Secondary | ICD-10-CM | POA: Diagnosis not present

## 2019-09-28 DIAGNOSIS — K7031 Alcoholic cirrhosis of liver with ascites: Secondary | ICD-10-CM

## 2019-09-28 DIAGNOSIS — K7460 Unspecified cirrhosis of liver: Secondary | ICD-10-CM

## 2019-09-28 DIAGNOSIS — B182 Chronic viral hepatitis C: Secondary | ICD-10-CM | POA: Insufficient documentation

## 2019-09-28 DIAGNOSIS — K7290 Hepatic failure, unspecified without coma: Secondary | ICD-10-CM | POA: Diagnosis not present

## 2019-09-28 LAB — CBC WITH DIFFERENTIAL/PLATELET
Basophils Absolute: 0 10*3/uL (ref 0.0–0.1)
Basophils Relative: 0.7 % (ref 0.0–3.0)
Eosinophils Absolute: 0.2 10*3/uL (ref 0.0–0.7)
Eosinophils Relative: 2.2 % (ref 0.0–5.0)
HCT: 35.8 % — ABNORMAL LOW (ref 39.0–52.0)
Hemoglobin: 11.8 g/dL — ABNORMAL LOW (ref 13.0–17.0)
Lymphocytes Relative: 18.1 % (ref 12.0–46.0)
Lymphs Abs: 1.3 10*3/uL (ref 0.7–4.0)
MCHC: 33 g/dL (ref 30.0–36.0)
MCV: 96.9 fl (ref 78.0–100.0)
Monocytes Absolute: 0.7 10*3/uL (ref 0.1–1.0)
Monocytes Relative: 9.6 % (ref 3.0–12.0)
Neutro Abs: 5.1 10*3/uL (ref 1.4–7.7)
Neutrophils Relative %: 69.4 % (ref 43.0–77.0)
Platelets: 220 10*3/uL (ref 150.0–400.0)
RBC: 3.7 Mil/uL — ABNORMAL LOW (ref 4.22–5.81)
RDW: 15.5 % (ref 11.5–15.5)
WBC: 7.3 10*3/uL (ref 4.0–10.5)

## 2019-09-28 LAB — COMPREHENSIVE METABOLIC PANEL
ALT: 40 U/L (ref 0–53)
AST: 73 U/L — ABNORMAL HIGH (ref 0–37)
Albumin: 2.3 g/dL — ABNORMAL LOW (ref 3.5–5.2)
Alkaline Phosphatase: 122 U/L — ABNORMAL HIGH (ref 39–117)
BUN: 8 mg/dL (ref 6–23)
CO2: 30 mEq/L (ref 19–32)
Calcium: 8.3 mg/dL — ABNORMAL LOW (ref 8.4–10.5)
Chloride: 99 mEq/L (ref 96–112)
Creatinine, Ser: 0.82 mg/dL (ref 0.40–1.50)
GFR: 93.77 mL/min (ref >60.00)
Glucose, Bld: 105 mg/dL — ABNORMAL HIGH (ref 70–99)
Potassium: 3.4 mEq/L — ABNORMAL LOW (ref 3.5–5.1)
Sodium: 133 mEq/L — ABNORMAL LOW (ref 135–145)
Total Bilirubin: 2.3 mg/dL — ABNORMAL HIGH (ref 0.2–1.2)
Total Protein: 8 g/dL (ref 6.0–8.3)

## 2019-09-28 LAB — PROTIME-INR
INR: 1.5 ratio — ABNORMAL HIGH (ref 0.8–1.0)
Prothrombin Time: 17.5 s — ABNORMAL HIGH (ref 9.6–13.1)

## 2019-09-28 MED ORDER — SPIRONOLACTONE 50 MG PO TABS: 50.0000 mg | ORAL_TABLET | Freq: Every day | ORAL | 1 refills | Status: DC

## 2019-09-28 MED ORDER — FUROSEMIDE 20 MG PO TABS: 40.0000 mg | ORAL_TABLET | Freq: Every day | ORAL | 0 refills | Status: DC

## 2019-09-28 NOTE — Patient Instructions (Addendum)
Your provider has requested that you go to the basement level for lab work before leaving today. Press "B" on the elevator. The lab is located at the first door on the left as you exit the elevator.   We have sent the following medications to your pharmacy for you to pick up at your convenience:  Lasix, Aldactone.  Increase your Aldactone to 50 (1 whole tablet)  Increase your Lasix to 40mg  (2 tablets)  You have been scheduled for an abdominal paracentesis at [facility] radiology (1st floor of hospital) on 09/29/2019 at 9:00am. Please arrive at least 15 minutes prior to your appointment time for registration. Should you need to reschedule this appointment for any reason, please call our office at [phone].  No Salt or MSG on food!  Please follow up with Amy Esterwood on 10/19/2019 at 130pm

## 2019-09-28 NOTE — Progress Notes (Signed)
____________________________________________________________  Attending physician addendum:  Thank you for sending this case to me. I have reviewed the entire note, and the outlined plan seems appropriate.  Please send a referral to Infectious Disease to plan HCV treatment (since viral load was drawn again and results pending)  Agree he needs close follow up to manage ascites, especially with language barrier.  Wilfrid Lund, MD  ____________________________________________________________

## 2019-09-28 NOTE — Progress Notes (Signed)
Subjective:    Patient ID: Donald Aguilar, male    DOB: 1951/08/11, 68 y.o.   MRN: [id number]  HPI Donald Aguilar is a 68 year old Vietnamese/[name] male, new to GI/Dr. Loletha Carrow when seen in consultation during recent hospitalization 11/20 through 09/15/2019.  He was admitted with abdominal swelling and discomfort as well as lower extremity edema. He was diagnosed with decompensated cirrhosis and anasarca.  He has history of chronic EtOH use/abuse and was also determined to be hep C positive during hospitalization.  Hepatitis C RNA quant was ordered but specimen was apparently insufficient.  He did have hep C genotyping done with genotype 1a.,  AFP 3.5, hepatitis B surface antibody negative hep B DNA undetectable.  HIV negative. He underwent diuresis and large-volume paracentesis, and was discharged on Aldactone 25 mg p.o. daily and Lasix 20 mg p.o. daily which were smaller doses than he was on while hospitalized. He did have follow-up labs done on 09/22/2019 showing sodium 132, potassium 3.4, creatinine 0.7.  Conversation was had via interpreter.  Patient says he felt okay for about a week after discharge from the hospital but has since had reaccumulation of abdominal swelling tightness and discomfort.  He feels some sense of shortness of breath with exertion.  He is also had increasing lower extremity edema and groin edema. Is not clear to me that he has been following a low-sodium diet, this was reviewed with him by myself during his hospitalization.  He says he has not had any alcohol for the past 3 months.  He has been taking his medications as instructed and brought them with him today.  His friend who accompanies him has been helping with meds.  Review of Systems Pertinent positive and negative review of systems were noted in the above HPI section.  All other review of systems was otherwise negative.  Outpatient Encounter Medications as of 09/28/2019  Medication Sig  . furosemide (LASIX) 20 MG tablet  Take 2 tablets (40 mg total) by mouth daily.  . pantoprazole (PROTONIX) 40 MG tablet Take 1 tablet (40 mg total) by mouth daily.  Marland Kitchen spironolactone (ALDACTONE) 50 MG tablet Take 1 tablet (50 mg total) by mouth daily.  . [DISCONTINUED] furosemide (LASIX) 20 MG tablet Take 1 tablet (20 mg total) by mouth daily.  . [DISCONTINUED] spironolactone (ALDACTONE) 50 MG tablet Take 0.5 tablets (25 mg total) by mouth daily.   No facility-administered encounter medications on file as of 09/28/2019.    No Known Allergies Patient Active Problem List   Diagnosis Date Noted  . Hepatitis C antibody test positive 09/28/2019  . Abnormal CT of the abdomen   . Erosive gastritis   . Cirrhosis of liver ([place]) 09/09/2019  . Ascites 09/09/2019  . Shortness of breath 09/09/2019   Social History   Socioeconomic History  . Marital status: Married    Spouse name: Not on file  . Number of children: Not on file  . Years of education: Not on file  . Highest education level: Not on file  Occupational History  . Not on file  Social Needs  . Financial resource strain: Not on file  . Food insecurity    Worry: Not on file    Inability: Not on file  . Transportation needs    Medical: Not on file    Non-medical: Not on file  Tobacco Use  . Smoking status: Current Every Day Smoker    Years: 50.00    Types: Cigarettes  . Tobacco comment: approximately a couple of  packs a week  Substance and Sexual Activity  . Alcohol use: Not Currently    Comment: last drank in September, 2020  . Drug use: Never  . Sexual activity: Not on file  Lifestyle  . Physical activity    Days per week: Not on file    Minutes per session: Not on file  . Stress: Not on file  Relationships  . Social Herbalist on phone: Not on file    Gets together: Not on file    Attends religious service: Not on file    Active member of club or organization: Not on file    Attends meetings of clubs or organizations: Not on file     Relationship status: Not on file  . Intimate partner violence    Fear of current or ex partner: Not on file    Emotionally abused: Not on file    Physically abused: Not on file    Forced sexual activity: Not on file  Other Topics Concern  . Not on file  Social History Narrative  . Not on file    Mr. Balthazor family history is not on file.      Objective:    Vitals:   09/28/19 1342  BP: 98/70  Pulse: (!) 108  Temp: 97.8 F (36.6 C)    Physical Exam Well-developed well-nourished older Asian male in no acute distress.   Weight, 145, up 5 to 6 pounds since discharge from hospital  BMI 24.1 Patient is accompanied by a [place] friend who helps with interpretation and an interpreter  HEENT; nontraumatic normocephalic, EOMI, PER R LA, sclera anicteric.  Thin, somewhat chronically ill-appearing Oropharynx; not examined/mask/Covid Neck; supple, no JVD Cardiovascular; regular rate and rhythm with S1-S2, no murmur rub or gallop Pulmonary; basilar rhonchi Abdomen; massively protuberant and tense ascites skin shiny, no focal tenderness, no guarding, bowel sounds present no palpable mass Rectal; not done today Skin; benign exam, no jaundice rash or appreciable lesions Extremities; 2+ edema to the mid thigh bilaterally, patient also reports testicular swelling Neuro/Psych; alert and oriented x4, grossly nonfocal mood and affect appropriate, no asterixis       Assessment & Plan:   #44 68 year old Vietnamese/[place] male with new diagnosis of decompensated cirrhosis with anasarca felt secondary to combination of EtOH and hepatitis C.  Patient has positive hep C antibody, and genotype 1A.  Hep C RNA quant was ordered but specimen insufficient to assess viral load  Patient has had increase in weight, and worsening of ascites and peripheral edema over the past couple of weeks presents today with very tense ascites.  #2 grade 2 esophageal varices and moderate portal gastropathy on  EGD done November 2020. Gastric biopsies also positive for H. pylori and patient has been treated.  Plan; patient will be scheduled for large-volume paracentesis tomorrow 09/29/2019.  Max , 7 Liters to be removed, have ordered IV albumin replacement post paracentesis.  Patient was reinstructed today on very low sodium diet.  Also advised him to eliminate MSG Increase Lasix from 20 mg p.o. daily to 40 mg p.o. daily (patient has 20 mg tabs) Increase Aldactone from 25 mg to 50 mg p.o. daily (patient has 50 mg tabs) Continue Protonix 40 mg p.o. every morning  Check hep C RNA quant CBC, pro time/INR and c-Met today  Follow-up office visit with Dr. Loletha Carrow or myself in 3 weeks.  We will need to renal function with increase in diuretics and will plan to repeat  in 7 to 10 days.     S  [name] 09/28/2019   Cc: No ref. provider found

## 2019-09-28 NOTE — Congregational Nurse Program (Signed)
Home visit with interpreter [name] assisting.  We helped patient complete paperwork he received from [place] regarding his Medicaid application.  It was faxed to Ms. Howell Rucks (phone # [phone].)  He states he is not feeling well again due to abdominal pain and ascites.  He has an appointment at 1:30 today with  GI.  [name] RN, Congregational Nurse [phone]

## 2019-09-29 ENCOUNTER — Ambulatory Visit (HOSPITAL_COMMUNITY)
Admission: RE | Admit: 2019-09-29 | Discharge: 2019-09-29 | Disposition: A | Discharge status: Home / Self Care | Payer: Medicare Other | Source: Ambulatory Visit | Attending: Physician Assistant | Admitting: Physician Assistant

## 2019-09-29 DIAGNOSIS — R188 Other ascites: Secondary | ICD-10-CM | POA: Insufficient documentation

## 2019-09-29 DIAGNOSIS — B1920 Unspecified viral hepatitis C without hepatic coma: Secondary | ICD-10-CM | POA: Diagnosis not present

## 2019-09-29 DIAGNOSIS — K7031 Alcoholic cirrhosis of liver with ascites: Secondary | ICD-10-CM

## 2019-09-29 DIAGNOSIS — K7460 Unspecified cirrhosis of liver: Secondary | ICD-10-CM | POA: Diagnosis not present

## 2019-09-29 DIAGNOSIS — K7290 Hepatic failure, unspecified without coma: Secondary | ICD-10-CM | POA: Insufficient documentation

## 2019-09-29 HISTORY — PX: IR PARACENTESIS: IMG2679

## 2019-09-29 LAB — BODY FLUID CELL COUNT WITH DIFFERENTIAL
Eos, Fluid: 0 %
Lymphs, Fluid: 65 %
Monocyte-Macrophage-Serous Fluid: 32 % — ABNORMAL LOW (ref 50–90)
Neutrophil Count, Fluid: 3 % (ref 0–25)
Total Nucleated Cell Count, Fluid: 14 cu mm (ref 0–1000)

## 2019-09-29 LAB — GRAM STAIN

## 2019-09-29 IMAGING — US IR PARACENTESIS
1 series · 3 of 3 positions shown · non-contrast
Comparison: none

INDICATION: Patient with history of cirrhosis, hepatitis C, recurrent ascites.
Request for diagnostic and therapeutic paracentesis.

[Series 1: (id) · 3 of 3 slices shown]
[im 1/3]
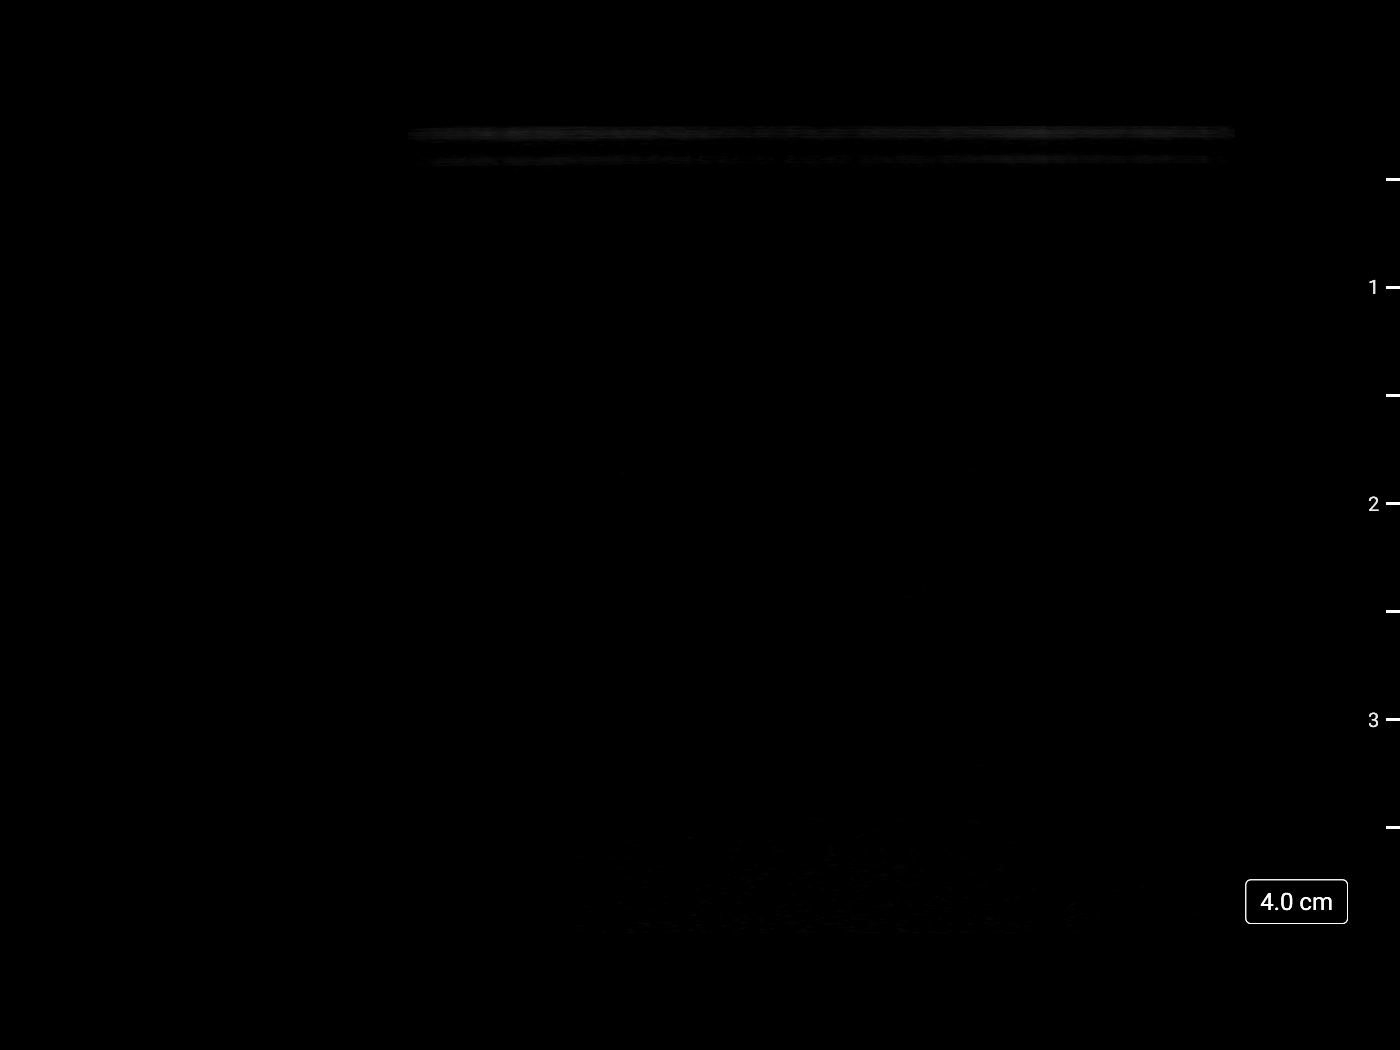
[im 2/3]
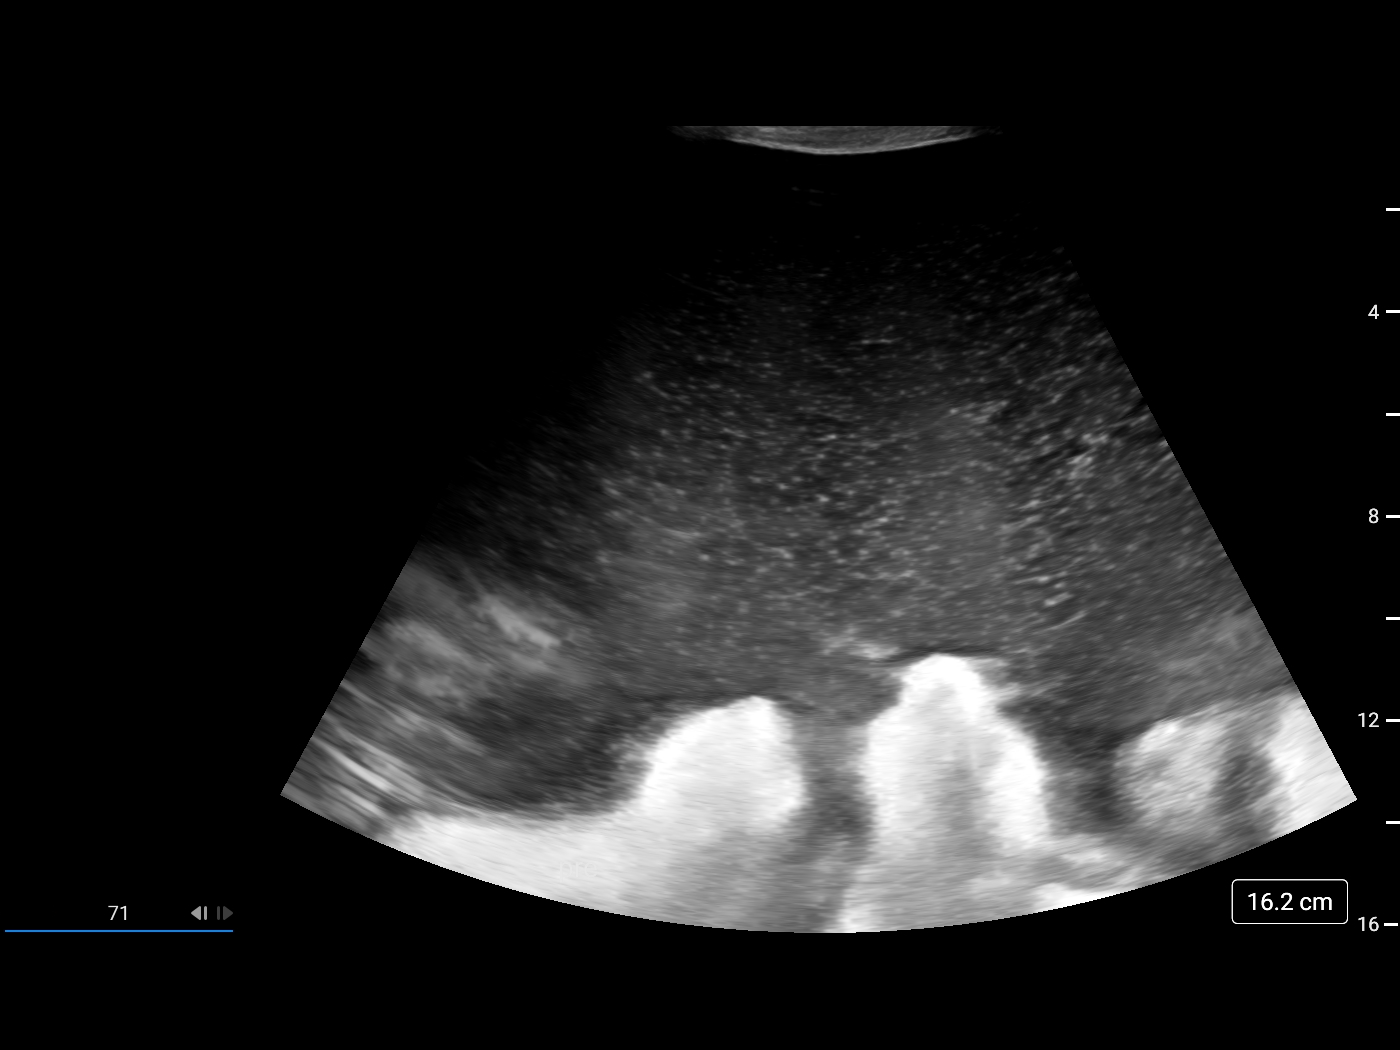
[im 3/3]
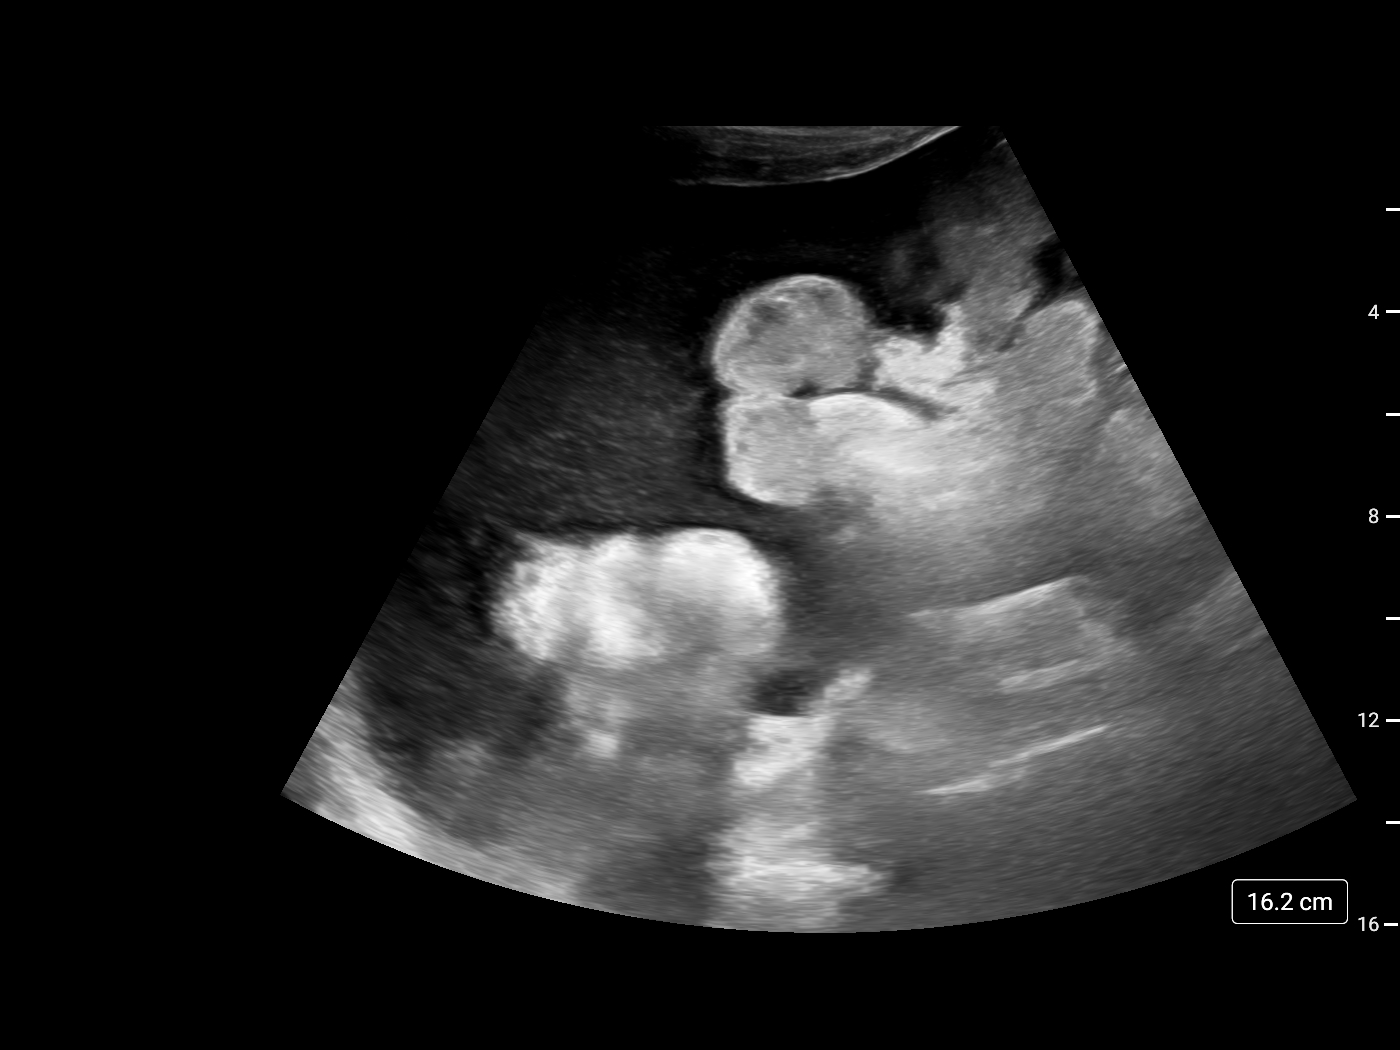

[3 of 3 positions shown; findings below may reference images not displayed]

EXAM:
ULTRASOUND GUIDED RIGHT LOWER QUADRANT PARACENTESIS

MEDICATIONS:
None.

COMPLICATIONS:
None immediate.

PROCEDURE:
Informed written consent was obtained from the patient after a
discussion of the risks, benefits and alternatives to treatment. A
timeout was performed prior to the initiation of the procedure.

Initial ultrasound scanning demonstrates a large amount of ascites
within the right lower abdominal quadrant. The right lower abdomen
was prepped and draped in the usual sterile fashion. 1% lidocaine
was used for local anesthesia.

Following this, a 19 gauge, 7-cm, Yueh catheter was introduced. An
ultrasound image was saved for documentation purposes. The
paracentesis was performed. The catheter was removed and a dressing
was applied. The patient tolerated the procedure well without
immediate post procedural complication.
Patient received post-procedure intravenous albumin; see nursing
notes for details.
FINDINGS: A total of approximately 7 L of hazy yellow fluid was removed.
Samples were sent to the laboratory as requested by the clinical
team.
IMPRESSION: Successful ultrasound-guided paracentesis yielding 7 liters of
peritoneal fluid.

## 2019-09-29 MED ORDER — ALBUMIN HUMAN 25 % IV SOLN
50.0000 g | Freq: Once | INTRAVENOUS | Status: AC
  Administered 2019-09-29: 50 g via INTRAVENOUS

## 2019-09-29 MED ORDER — LIDOCAINE HCL 1 % IJ SOLN
INTRAMUSCULAR | Status: DC | PRN
  Administered 2019-09-29: 10 mL

## 2019-09-29 MED ORDER — ALBUMIN HUMAN 25 % IV SOLN
INTRAVENOUS | Status: AC
  Filled 2019-09-29: qty 200

## 2019-09-29 MED ORDER — LIDOCAINE HCL 1 % IJ SOLN
INTRAMUSCULAR | Status: AC
  Filled 2019-09-29: qty 20

## 2019-09-29 NOTE — Procedures (Signed)
PROCEDURE SUMMARY:  Successful US guided paracentesis from RLQ.  Yielded 7 L of hazy yellow fluid.  No immediate complications.  Pt tolerated well.   Specimen was sent for labs.  EBL < 78mL  Ascencion Dike [name] 09/29/2019 10:09 AM

## 2019-09-30 LAB — PATHOLOGIST SMEAR REVIEW

## 2019-10-03 ENCOUNTER — Other Ambulatory Visit: Payer: Self-pay

## 2019-10-03 MED ORDER — PANTOPRAZOLE SODIUM 40 MG PO TBEC: 40.0000 mg | DELAYED_RELEASE_TABLET | Freq: Two times a day (BID) | ORAL | 0 refills | Status: DC

## 2019-10-03 MED ORDER — METRONIDAZOLE 250 MG PO TABS: 250.0000 mg | ORAL_TABLET | Freq: Four times a day (QID) | ORAL | 0 refills | Status: AC

## 2019-10-03 MED ORDER — BISMUTH SUBSALICYLATE 262 MG PO CHEW: 524.0000 mg | CHEWABLE_TABLET | Freq: Four times a day (QID) | ORAL | 0 refills | Status: AC

## 2019-10-03 MED ORDER — DOXYCYCLINE HYCLATE 100 MG PO CAPS: 100.0000 mg | ORAL_CAPSULE | Freq: Two times a day (BID) | ORAL | 0 refills | Status: AC

## 2019-10-04 LAB — HEPATITIS C RNA QUANTITATIVE
HCV Quantitative Log: 5.32 Log IU/mL — ABNORMAL HIGH
HCV RNA, PCR, QN: 208000 IU/mL — ABNORMAL HIGH

## 2019-10-04 NOTE — Progress Notes (Signed)
Please call patient, he has a good friend who does interpreting for him.  Would like to refer him to infectious disease for consideration of treatment of hep C, please arrange appointment, thanks

## 2019-10-05 ENCOUNTER — Ambulatory Visit: Payer: Self-pay | Admitting: Family Medicine

## 2019-10-12 ENCOUNTER — Emergency Department (HOSPITAL_COMMUNITY)
Admission: EM | Admit: 2019-10-12 | Discharge: 2019-10-12 | Discharge status: Against Medical Advice | Payer: Medicare Other | Attending: Emergency Medicine | Admitting: Emergency Medicine

## 2019-10-12 ENCOUNTER — Telehealth: Payer: Self-pay | Admitting: Physician Assistant

## 2019-10-12 ENCOUNTER — Other Ambulatory Visit: Payer: Self-pay

## 2019-10-12 ENCOUNTER — Encounter (HOSPITAL_COMMUNITY): Payer: Self-pay

## 2019-10-12 ENCOUNTER — Emergency Department (HOSPITAL_COMMUNITY): Payer: Medicare Other

## 2019-10-12 DIAGNOSIS — K7460 Unspecified cirrhosis of liver: Secondary | ICD-10-CM

## 2019-10-12 DIAGNOSIS — Z5321 Procedure and treatment not carried out due to patient leaving prior to being seen by health care provider: Secondary | ICD-10-CM | POA: Insufficient documentation

## 2019-10-12 DIAGNOSIS — R0602 Shortness of breath: Secondary | ICD-10-CM | POA: Diagnosis present

## 2019-10-12 DIAGNOSIS — K7290 Hepatic failure, unspecified without coma: Secondary | ICD-10-CM

## 2019-10-12 LAB — LIPASE, BLOOD: Lipase: 47 U/L (ref 11–51)

## 2019-10-12 LAB — COMPREHENSIVE METABOLIC PANEL
ALT: 34 U/L (ref 0–44)
AST: 69 U/L — ABNORMAL HIGH (ref 15–41)
Albumin: 2 g/dL — ABNORMAL LOW (ref 3.5–5.0)
Alkaline Phosphatase: 88 U/L (ref 38–126)
Anion gap: 10 (ref 5–15)
BUN: 9 mg/dL (ref 8–23)
CO2: 27 mmol/L (ref 22–32)
Calcium: 8.2 mg/dL — ABNORMAL LOW (ref 8.9–10.3)
Chloride: 100 mmol/L (ref 98–111)
Creatinine, Ser: 0.81 mg/dL (ref 0.61–1.24)
GFR calc Af Amer: >60 mL/min (ref >60)
GFR calc non Af Amer: >60 mL/min (ref >60)
Glucose, Bld: 114 mg/dL — ABNORMAL HIGH (ref 70–99)
Potassium: 3.2 mmol/L — ABNORMAL LOW (ref 3.5–5.1)
Sodium: 137 mmol/L (ref 135–145)
Total Bilirubin: 2.8 mg/dL — ABNORMAL HIGH (ref 0.3–1.2)
Total Protein: 7.6 g/dL (ref 6.5–8.1)

## 2019-10-12 LAB — CBC
HCT: 35.7 % — ABNORMAL LOW (ref 39.0–52.0)
Hemoglobin: 11.6 g/dL — ABNORMAL LOW (ref 13.0–17.0)
MCH: 32.1 pg (ref 26.0–34.0)
MCHC: 32.5 g/dL (ref 30.0–36.0)
MCV: 98.9 fL (ref 80.0–100.0)
Platelets: 190 10*3/uL (ref 150–400)
RBC: 3.61 MIL/uL — ABNORMAL LOW (ref 4.22–5.81)
RDW: 16.2 % — ABNORMAL HIGH (ref 11.5–15.5)
WBC: 7.2 10*3/uL (ref 4.0–10.5)
nRBC: 0 % (ref 0.0–0.2)

## 2019-10-12 IMAGING — CR DG CHEST 2V
2 series · 2 of 2 positions shown · non-contrast
Comparison: [DATE]

CLINICAL DATA: Shortness of breath

EXAM:
CHEST - 2 VIEW

[chest lat]
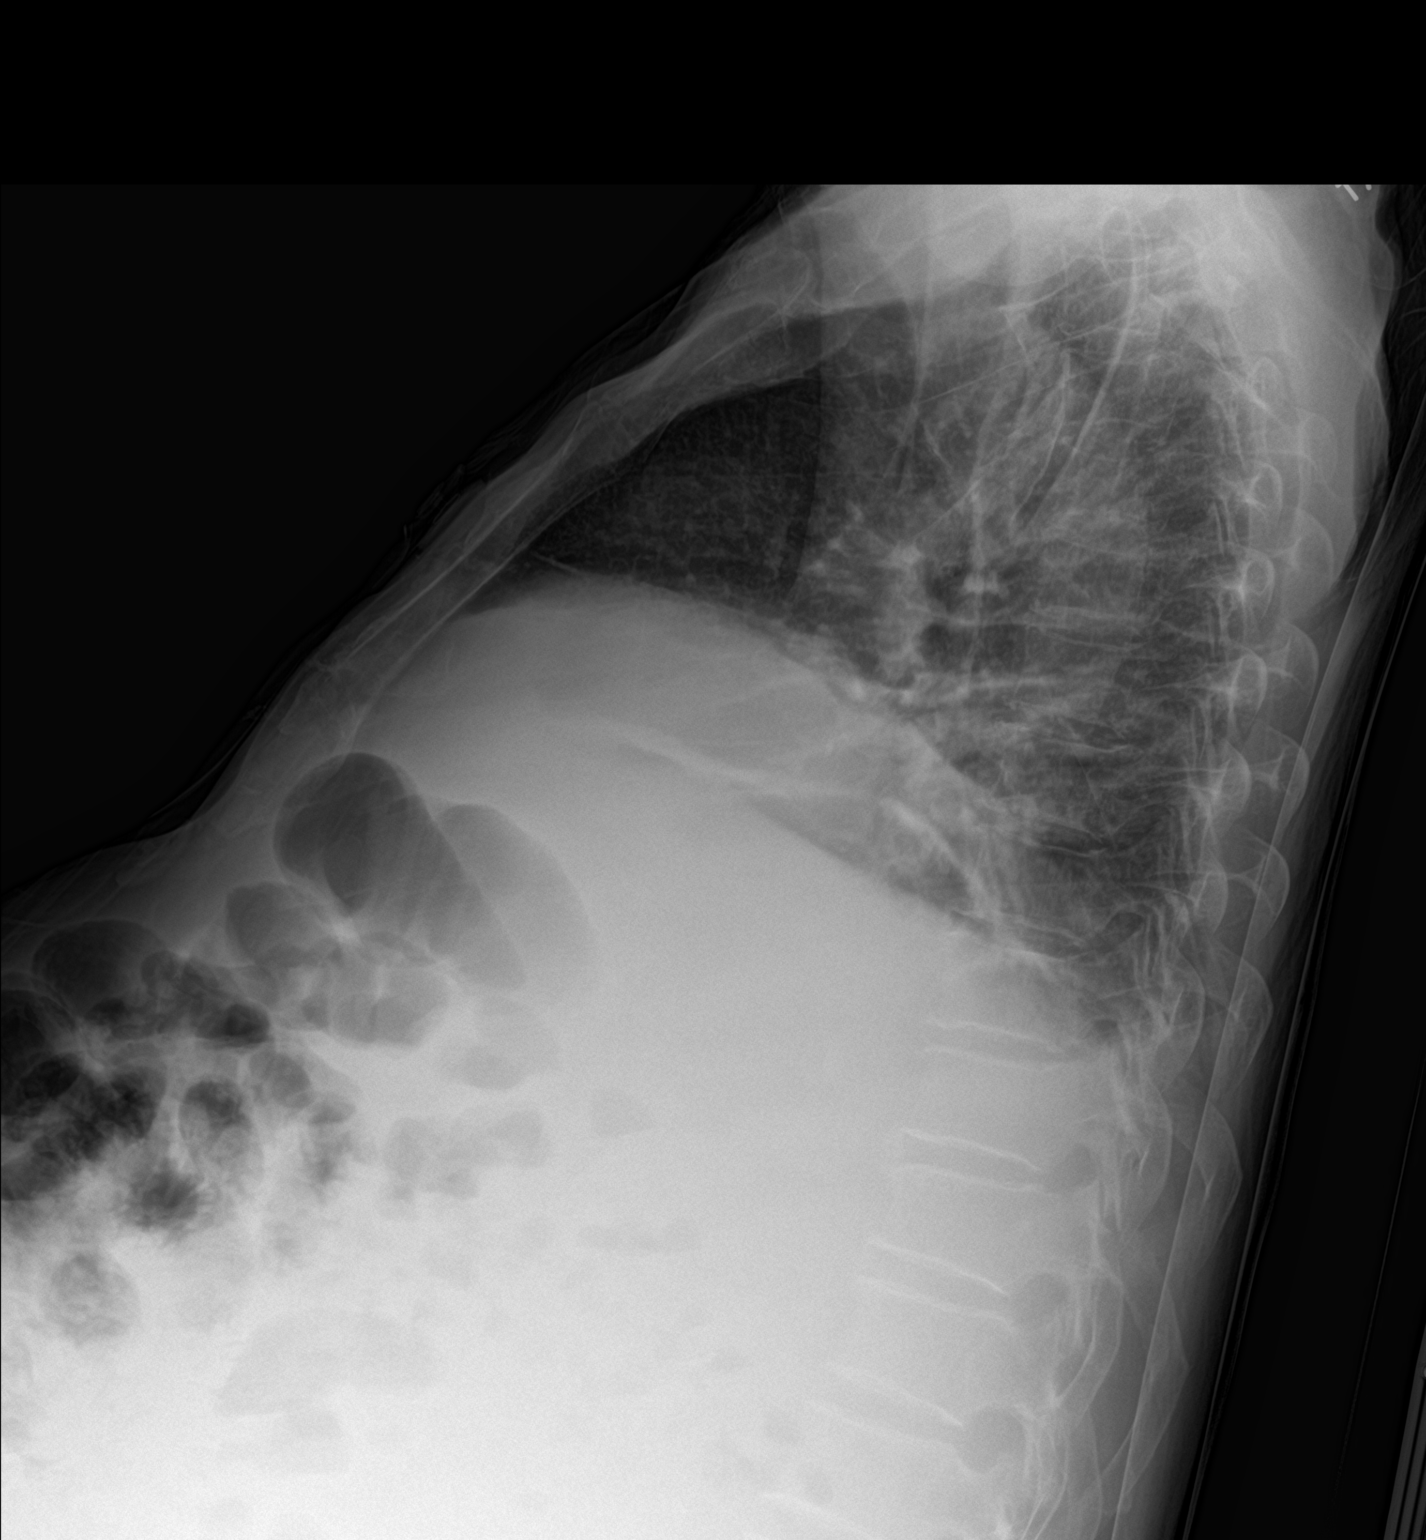

[chest ap]
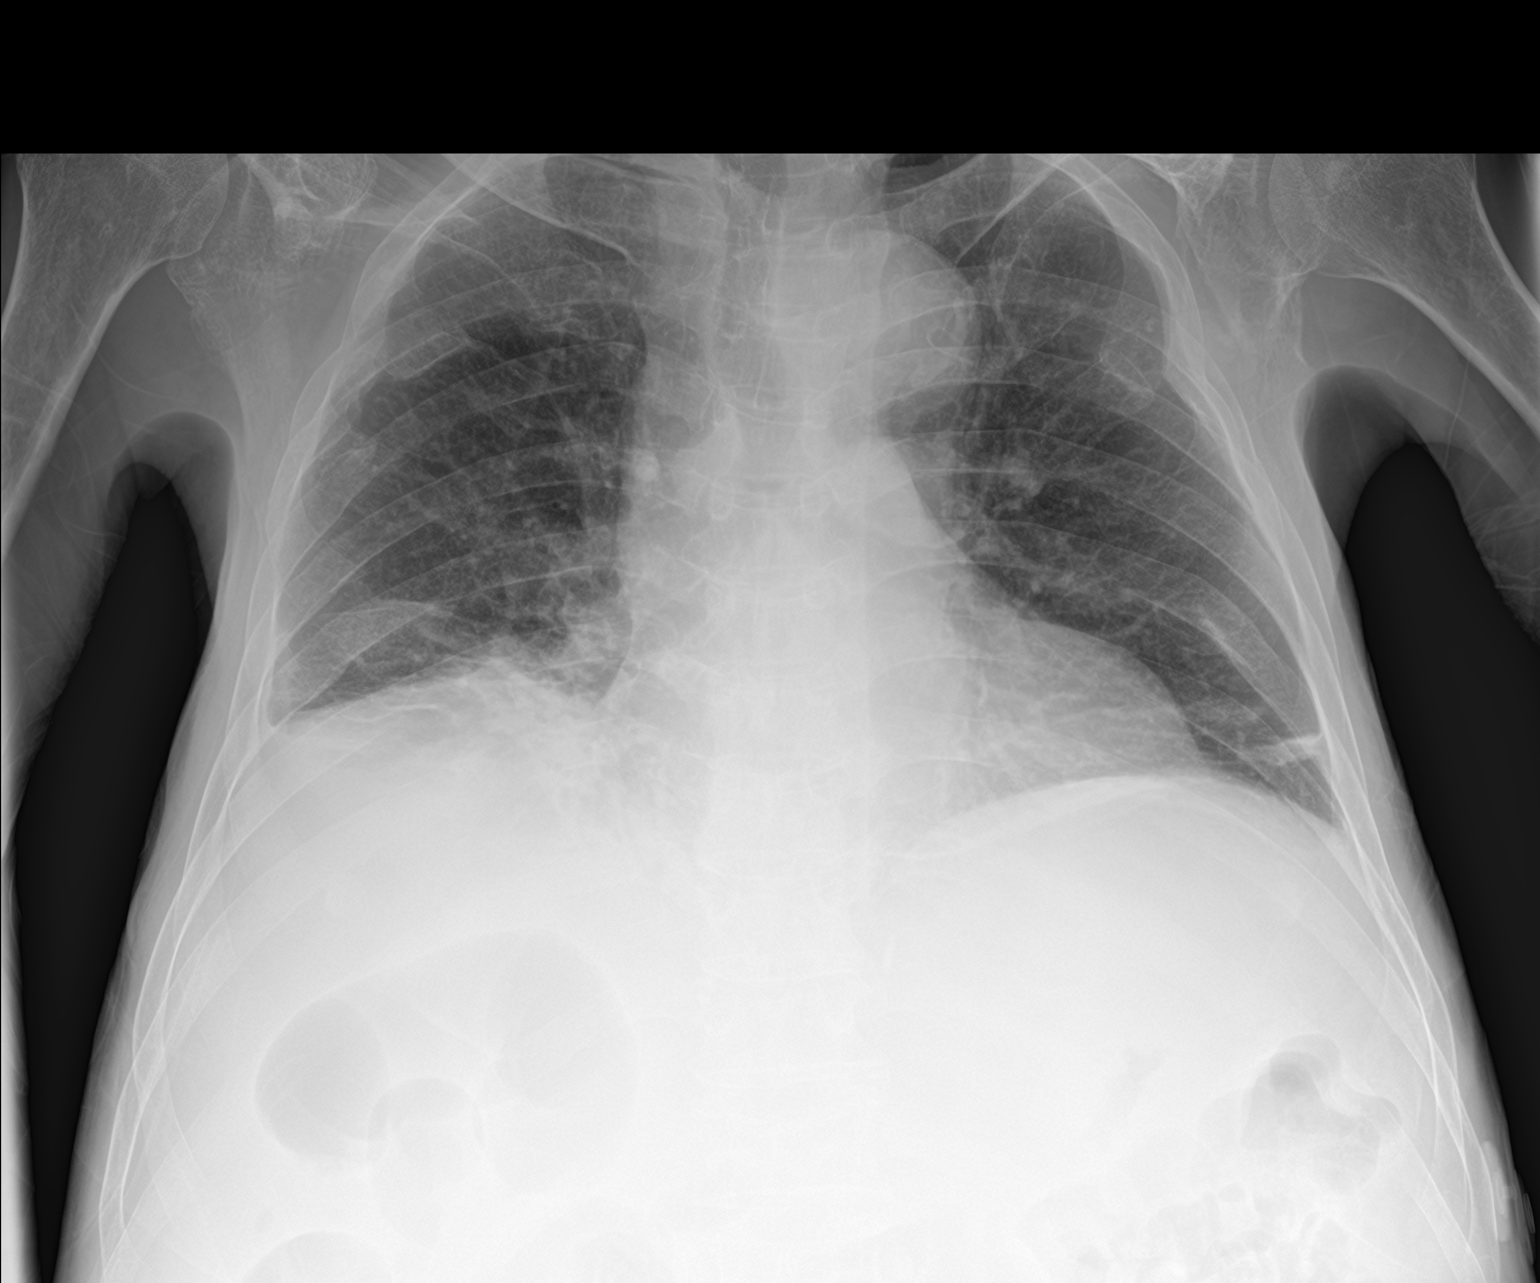

[2 of 2 positions shown; findings below may reference images not displayed]

FINDINGS: Upper normal heart size.

Prominent aortic arch with atherosclerotic calcification, unchanged.

Pulmonary vascularity normal.

Persistent bibasilar atelectasis and tiny RIGHT pleural effusion.

No pneumothorax.

Old BILATERAL rib fractures and old healed LEFT clavicular fracture.

Osseous demineralization.
IMPRESSION: Persistent bibasilar atelectasis and tiny RIGHT pleural effusion.

Aortic Atherosclerosis ([UO]-[UO]).

## 2019-10-12 NOTE — Telephone Encounter (Signed)
Congregational nurse calls with patient's translator present. The patient has complaints of worsening fluid build up "full up." Relates it as being as bad when he was hospitalized. Confirmed he is taking his medication daily. Feel that it is harder to breathe. Does not want to get OOB to go to the bathroom because it is so hard to move.  ED for evaluation? His appointment is 10/19/19.

## 2019-10-12 NOTE — Telephone Encounter (Signed)
If radiology can do a large volume paracentesis today, then please make the arrangements.  Maximum 5 liters fluid removal, and give 25 grams of IV albumin.  If radiology cannot do that today, then this patient must go to the ED today because he reports shortness of breath from the ascites.

## 2019-10-12 NOTE — ED Triage Notes (Signed)
Pt sent here by PCP for paracentesis. Pt has hx of liver failure and is unable to get in with Gi to get it done. Pt c.o abd pain and SOB. abd very distended, resp e.u at this time.

## 2019-10-12 NOTE — ED Notes (Signed)
Pt had a room ready and cleaned and pt was made aware. Insisted on leaving stating he was tired of being here. Pt strongly encouraged to stay but refuses.

## 2019-10-12 NOTE — Progress Notes (Signed)
Spoke with Engineer, technical sales. No openings for paracentesis today.  Spoke with [name]. She will make sure the patient is given the recommendations. Keep the appointment here as scheduled next week.

## 2019-10-12 NOTE — Congregational Nurse Program (Signed)
CN office visit by patient's friend and caregiver Y'Phi Mlo. Mr. Donald Aguilar states patient does not feel like coming because the swelling in his abdomen has returned and he has difficulty even getting to the bathroom due to discomfort.  He also has some SOB.  He is scheduled to see Edward Jolly at [facility] on 10/19/2019 but does not feel like he can wait that long.  Phone call to [facility]. Dr. Loletha Carrow was consulted and feels that patient needs paracentesis again but they were unable to schedule it for today.  Advised patient to go to ED.  Mr. Donald Aguilar agreed to take him.  [name] RN, Congregational Nurse [phone]

## 2019-10-19 ENCOUNTER — Other Ambulatory Visit: Payer: Self-pay

## 2019-10-19 ENCOUNTER — Ambulatory Visit (HOSPITAL_COMMUNITY)
Admission: RE | Admit: 2019-10-19 | Discharge: 2019-10-19 | Disposition: A | Discharge status: Home / Self Care | Payer: Medicare Other | Source: Ambulatory Visit | Attending: Gastroenterology | Admitting: Gastroenterology

## 2019-10-19 ENCOUNTER — Ambulatory Visit (HOSPITAL_COMMUNITY): Payer: Medicare Other

## 2019-10-19 ENCOUNTER — Ambulatory Visit (INDEPENDENT_AMBULATORY_CARE_PROVIDER_SITE_OTHER): Payer: Medicare Other | Admitting: Physician Assistant

## 2019-10-19 VITALS — BP 98/62 | Temp 97.3°F | Ht 65.0 in | Wt 159.0 lb

## 2019-10-19 DIAGNOSIS — K7460 Unspecified cirrhosis of liver: Secondary | ICD-10-CM | POA: Diagnosis not present

## 2019-10-19 DIAGNOSIS — K7290 Hepatic failure, unspecified without coma: Secondary | ICD-10-CM

## 2019-10-19 DIAGNOSIS — R188 Other ascites: Secondary | ICD-10-CM | POA: Diagnosis not present

## 2019-10-19 DIAGNOSIS — K7031 Alcoholic cirrhosis of liver with ascites: Secondary | ICD-10-CM

## 2019-10-19 HISTORY — PX: IR PARACENTESIS: IMG2679

## 2019-10-19 IMAGING — US IR PARACENTESIS
1 series · 5 of 5 positions shown · non-contrast
Comparison: none

INDICATION: Patient with history of cirrhosis, ascites. Request is made for
therapeutic paracentesis of up to 5 L maximum.

[Series 1: (id) · 5 of 5 slices shown]
[im 1/5]
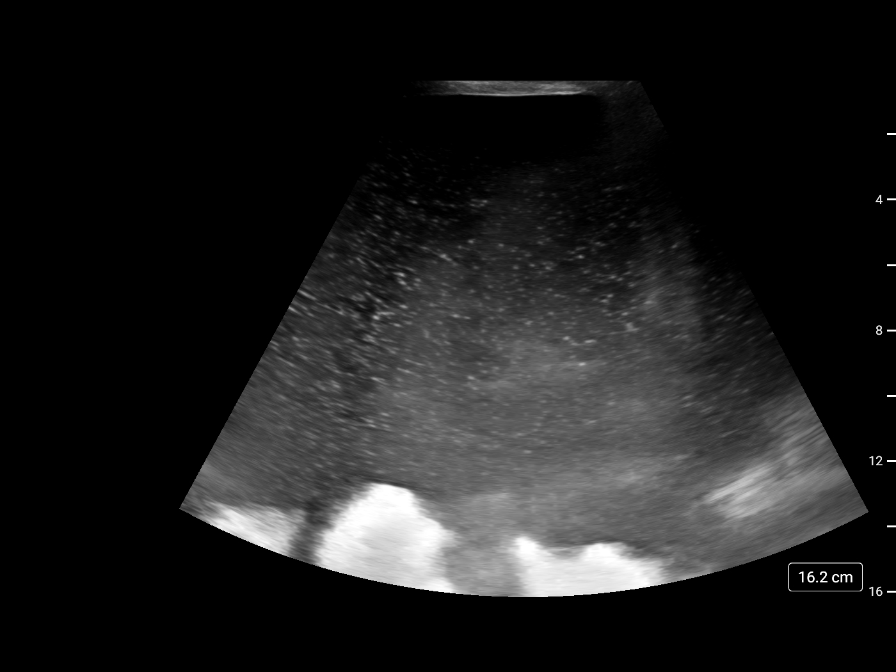
[im 2/5]
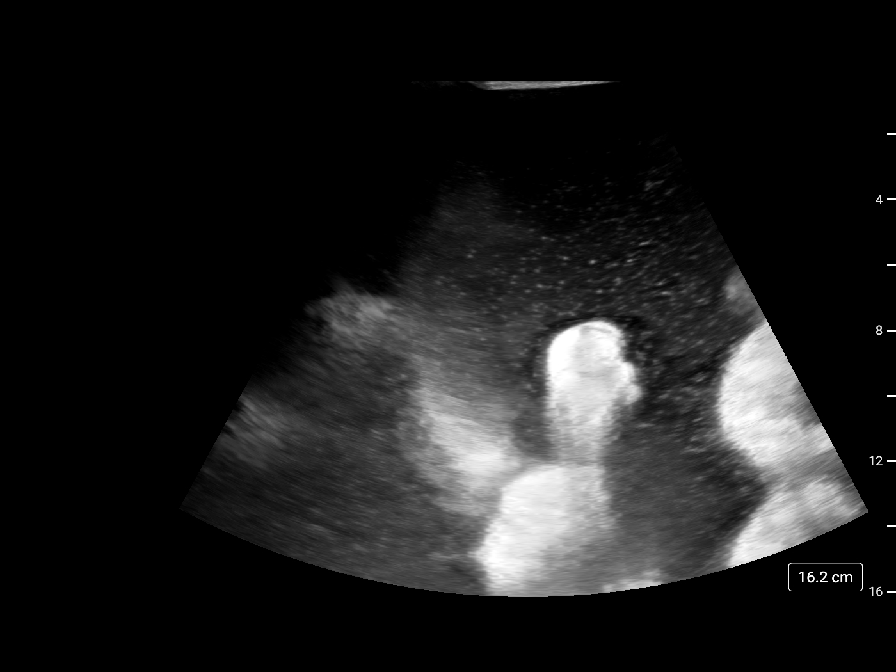
[im 3/5]
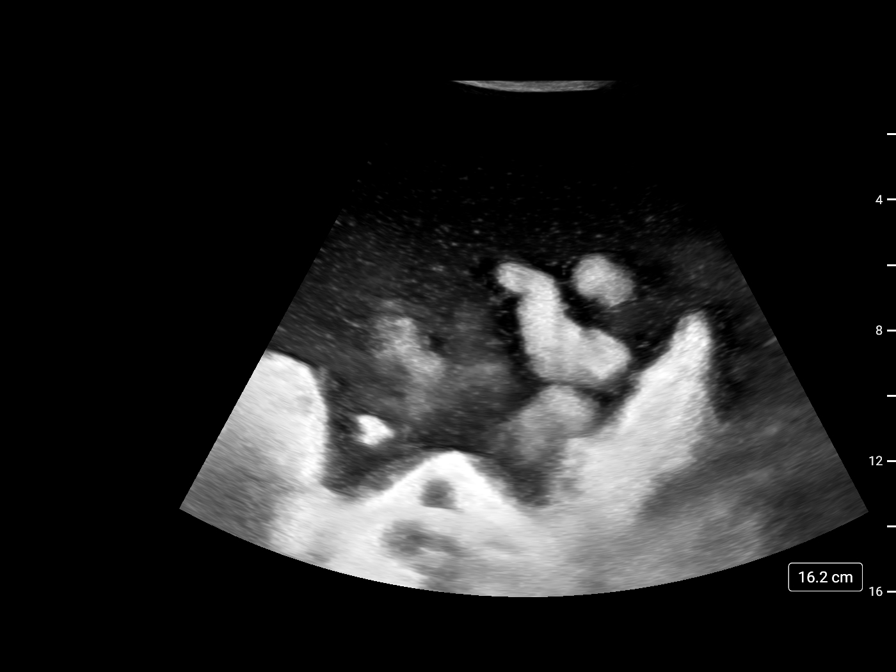
[im 4/5]
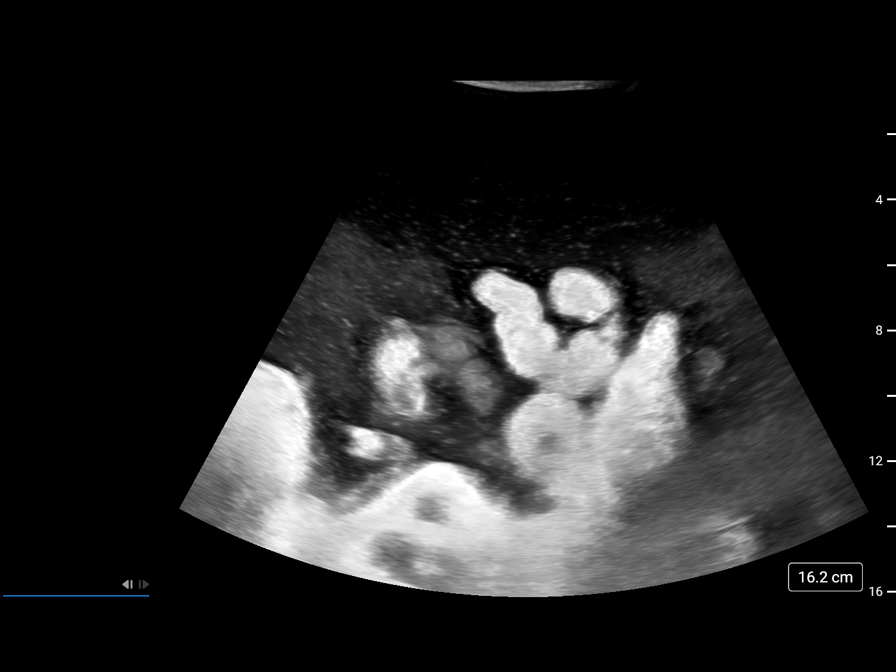
[im 5/5]
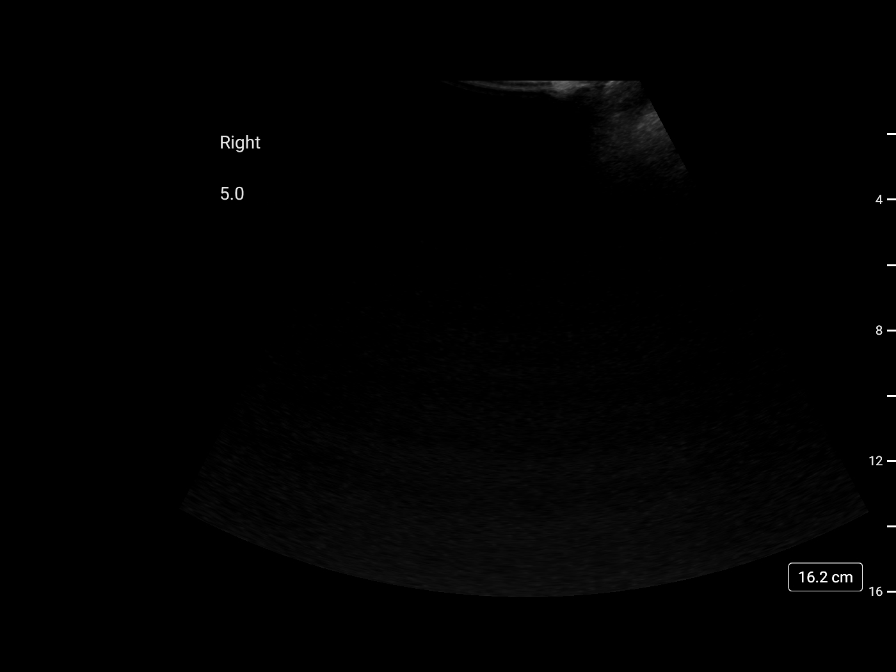

[5 of 5 positions shown; findings below may reference images not displayed]

EXAM:
ULTRASOUND GUIDED THERAPEUTIC PARACENTESIS

MEDICATIONS:
10 mL 1% lidocaine

COMPLICATIONS:
None immediate.

PROCEDURE:
Informed written consent was obtained from the patient after a
discussion of the risks, benefits and alternatives to treatment. A
timeout was performed prior to the initiation of the procedure.

Initial ultrasound scanning demonstrates a large amount of ascites
within the right lower abdominal quadrant. The right lower abdomen
was prepped and draped in the usual sterile fashion. 1% lidocaine
was used for local anesthesia.

Following this, a 19 gauge, 7-cm, Yueh catheter was introduced. An
ultrasound image was saved for documentation purposes. The
paracentesis was performed. The catheter was removed and a dressing
was applied. The patient tolerated the procedure well without
immediate post procedural complication.
FINDINGS: A total of approximately 5.0 liters of yellow fluid was removed.
IMPRESSION: Successful ultrasound-guided therapeutic paracentesis yielding
liters of peritoneal fluid.

## 2019-10-19 MED ORDER — SPIRONOLACTONE 50 MG PO TABS: 50.0000 mg | ORAL_TABLET | Freq: Every day | ORAL | 3 refills | Status: DC

## 2019-10-19 MED ORDER — LIDOCAINE HCL (PF) 1 % IJ SOLN
INTRAMUSCULAR | Status: DC | PRN
  Administered 2019-10-19: 10 mL

## 2019-10-19 MED ORDER — FUROSEMIDE 40 MG PO TABS: 40.0000 mg | ORAL_TABLET | Freq: Every day | ORAL | 3 refills | Status: DC

## 2019-10-19 MED ORDER — ALBUMIN HUMAN 25 % IV SOLN
50.0000 g | Freq: Once | INTRAVENOUS | Status: DC
  Filled 2019-10-19: qty 200

## 2019-10-19 MED ORDER — LIDOCAINE HCL 1 % IJ SOLN
INTRAMUSCULAR | Status: AC
  Filled 2019-10-19: qty 20

## 2019-10-19 MED ORDER — ALBUMIN HUMAN 25 % IV SOLN
INTRAVENOUS | Status: AC
  Filled 2019-10-19: qty 200

## 2019-10-19 MED ORDER — ALBUMIN HUMAN 25 % IV SOLN: 12.5000 g | Freq: Once | INTRAVENOUS | Status: DC

## 2019-10-19 NOTE — Patient Instructions (Addendum)
If you are age 66 or older, your body mass index should be between 23-30. Your Body mass index is 26.46 kg/m. If this is out of the aforementioned range listed, please consider follow up with your Primary Care Provider.  If you are age 67 or younger, your body mass index should be between 19-25. Your Body mass index is 26.46 kg/m. If this is out of the aformentioned range listed, please consider follow up with your Primary Care Provider.   You have been scheduled for an abdominal paracentesis at [facility] radiology (1st floor of hospital) 10/19/2019- now Please arrive at least 15 minutes prior to your appointment time for registration. Should you need to reschedule this appointment for any reason, please call our office at [phone].  We have sent the following medications to your pharmacy for you to pick up at your convenience:  Lasix 40mg  daily spironolactone 40mg  daily  Continue protonix, dicyclomine and flagyl   Please return to the office in 2 weeks.

## 2019-10-19 NOTE — Procedures (Signed)
PROCEDURE SUMMARY:  Successful US guided paracentesis from right lateral abdomen.  Yielded 5.0 liters of yellow fluid.  No immediate complications.  Pt tolerated well.   Specimen was not sent for labs.  EBL < 71mL  Docia Barrier [name] 10/19/2019 4:10 PM

## 2019-10-19 NOTE — Progress Notes (Signed)
Subjective:    Patient ID: Donald Aguilar, male    DOB: 05/19/1951, 68 y.o.   MRN: [id number]  HPI Donald Aguilar is a 68 year old [name] male known to Dr. Loletha Carrow and myself recently.  He was last seen in the office on September 28, 2019 and comes back in today for follow-up.  Patient has decompensated cirrhosis with anasarca felt to be combination of EtOH and hepatitis C. Recent hep C RNA quant 208,000.  He is being referred to ID. At time of last office visit he had massive ascites, and was scheduled for large-volume paracentesis which was done on September 29, 2019.  He had 7 L removed and received albumin.  Cell counts were negative for SBP.  At the time of that office visit we was also carefully instructed to increase Lasix to 40 mg p.o. daily and increase Aldactone to 50 mg p.o. daily. He went to the emergency room on October 12, 2019 with recurrence of tense ascites however left AMA. Today he is complaining of very tight ascites, says he is uncomfortable lying down and feels somewhat short of breath and is only able to eat very small amounts at a time.  He is also had increasing lower extremity edema. His friend who has been helping him with medications brought meds with him today which included Pepto-Bismol tablets doxycycline and metronidazole.  He was H. pylori positive in November, and I suspect they had just recently picked up these medications.  When asked about Lasix and Aldactone neither one of them was clear about why he was not taking those currently and they seem to indicate they did not have any at home. Weight is up 14 pounds from last office visit.  Review of Systems unable to obtain detailed review of systems due to significant language barrier  Outpatient Encounter Medications as of 10/19/2019  Medication Sig  . doxycycline (DORYX) 100 MG EC tablet Take 100 mg by mouth 2 (two) times daily.  . metroNIDAZOLE (FLAGYL) 250 MG tablet Take 250 mg by mouth 4 (four) times daily.  .  pantoprazole (PROTONIX) 40 MG tablet Take 1 tablet (40 mg total) by mouth daily.  Marland Kitchen spironolactone (ALDACTONE) 50 MG tablet Take 1 tablet (50 mg total) by mouth daily.  . [DISCONTINUED] furosemide (LASIX) 20 MG tablet Take 2 tablets (40 mg total) by mouth daily.  . furosemide (LASIX) 40 MG tablet Take 1 tablet (40 mg total) by mouth daily.  Marland Kitchen spironolactone (ALDACTONE) 50 MG tablet Take 1 tablet (50 mg total) by mouth daily.  . [DISCONTINUED] pantoprazole (PROTONIX) 40 MG tablet Take 1 tablet (40 mg total) by mouth 2 (two) times daily.   Facility-Administered Encounter Medications as of 10/19/2019  Medication  . albumin human 25 % solution 12.5 g   No Known Allergies Patient Active Problem List   Diagnosis Date Noted  . Hepatitis C antibody test positive 09/28/2019  . Abnormal CT of the abdomen   . Erosive gastritis   . Cirrhosis of liver ([place]) 09/09/2019  . Ascites 09/09/2019  . Shortness of breath 09/09/2019   Social History   Socioeconomic History  . Marital status: Married    Spouse name: Not on file  . Number of children: Not on file  . Years of education: Not on file  . Highest education level: Not on file  Occupational History  . Not on file  Tobacco Use  . Smoking status: Current Every Day Smoker    Years: 50.00    Types: Cigarettes  .  Tobacco comment: approximately a couple of packs a week  Substance and Sexual Activity  . Alcohol use: Not Currently    Comment: last drank in September, 2020  . Drug use: Never  . Sexual activity: Not on file  Other Topics Concern  . Not on file  Social History Narrative  . Not on file   Social Determinants of Health   Financial Resource Strain:   . Difficulty of Paying Living Expenses: Not on file  Food Insecurity:   . Worried About Charity fundraiser in the Last Year: Not on file  . Ran Out of Food in the Last Year: Not on file  Transportation Needs:   . Lack of Transportation (Medical): Not on file  . Lack of  Transportation (Non-Medical): Not on file  Physical Activity:   . Days of Exercise per Week: Not on file  . Minutes of Exercise per Session: Not on file  Stress:   . Feeling of Stress : Not on file  Social Connections:   . Frequency of Communication with Friends and Family: Not on file  . Frequency of Social Gatherings with Friends and Family: Not on file  . Attends Religious Services: Not on file  . Active Member of Clubs or Organizations: Not on file  . Attends Archivist Meetings: Not on file  . Marital Status: Not on file  Intimate Partner Violence:   . Fear of Current or Ex-Partner: Not on file  . Emotionally Abused: Not on file  . Physically Abused: Not on file  . Sexually Abused: Not on file    Donald Aguilar family history is not on file.      Objective:    Vitals:   10/19/19 1332  BP: 98/62  Temp: (!) 97.3 F (36.3 C)    Physical Exam Well-developed chronically ill-appearing [place] male in no acute distress..  Accompanied by his friend and and terpreter Weight, 159 BMI 26.4.  Weight is up 14 pounds since last office visit  HEENT; nontraumatic normocephalic, EOMI, PE R LA, sclera with early icterus Oropharynx; not examined/mask/Covid Neck; supple, no JVD Cardiovascular; regular rate and rhythm with S1-S2, no murmur rub or gallop Pulmonary; scattered basilar crackles Abdomen; very tight tense ascites hugely distended, bowel sounds are present no focal tenderness no palpable mass Rectal; not done today Skin; benign exam, no jaundice rash or appreciable lesions Extremities; 1-2+ edema to the knees bilaterally Neuro/Psych; alert and oriented x4, grossly nonfocal mood and affect appropriate, no asterixis       Assessment & Plan:   #22 68 year old [name] male with decompensated cirrhosis secondary to hepatitis C and EtOH.  Patient still has significant anasarca, and has been noncompliant with diuretics, I believe inadvertently secondary to  language barrier and understanding despite having interpreter present at the time of his office visits. He has had 2 large-volume paracenteses over the past 6 weeks. He has been instructed in low-sodium diet but suspect not compliant with diet restrictions  Current M ELD=14  #2 active hepatitis C-genotype Ia  #3.  Grade 2 esophageal varices and moderate portal gastropathy/EGD November 2020 #4 H. pylori positive-currently taking course of antibiotics  Plan; we were able to get patient scheduled for large-volume paracentesis this afternoon with instructions to remove 7 to 8 L, he will receive 50 g of albumin. Reemphasized need for 2 g sodium diet I have called in new prescriptions for Lasix 40 mg 1 p.o. every morning and Aldactone 50 mg p.o. every morning.  Reemphasize that these are to be taken every day long-term. He was instructed to complete the current course of Pepto-Bismol, doxycycline and metronidazole for H. Pylori. He may benefit from regularly scheduled paracenteses however this is going to be difficult due to the language barrier.  I will plan to see him back in 2 weeks and follow more closely until we establish if he will respond to diuretics if taking on a regular basis Referral has been initiated to ID for hep C treatment We will repeat labs at next office visit.Marland Kitchen  Donald Parrillo S Tayelor Osborne [name] 10/19/2019   Cc: No ref. provider found

## 2019-10-24 NOTE — Progress Notes (Signed)
____________________________________________________________  Attending physician addendum:  Thank you for sending this case to me. I have reviewed the entire note, and the outlined plan seems appropriate.  Agree that , despite our best efforts to coordinate care and arrange follow up, the language barrier and poor health literacy will continue to be a significant challenge in management of his ascites.   If possible, it would be helpful to have clinic nursing go ahead now and set up a tentative appointment for paracentesis within a day or two after his next clinic visit with you, as I expect he will need it.  Wilfrid Lund, MD  ____________________________________________________________

## 2019-11-01 ENCOUNTER — Other Ambulatory Visit: Payer: Self-pay

## 2019-11-01 ENCOUNTER — Encounter (INDEPENDENT_AMBULATORY_CARE_PROVIDER_SITE_OTHER): Payer: Self-pay | Admitting: Primary Care

## 2019-11-01 ENCOUNTER — Telehealth: Payer: Self-pay | Admitting: Physician Assistant

## 2019-11-01 ENCOUNTER — Ambulatory Visit (INDEPENDENT_AMBULATORY_CARE_PROVIDER_SITE_OTHER): Payer: Medicare Other | Admitting: Primary Care

## 2019-11-01 ENCOUNTER — Emergency Department (HOSPITAL_COMMUNITY)
Admission: EM | Admit: 2019-11-01 | Discharge: 2019-11-02 | Disposition: A | Discharge status: Home / Self Care | Payer: Medicare Other | Attending: Emergency Medicine | Admitting: Emergency Medicine

## 2019-11-01 ENCOUNTER — Encounter (HOSPITAL_COMMUNITY): Payer: Self-pay | Admitting: Emergency Medicine

## 2019-11-01 VITALS — BP 110/76 | HR 109 | Temp 97.3°F | Ht 65.0 in | Wt 135.8 lb

## 2019-11-01 DIAGNOSIS — N433 Hydrocele, unspecified: Secondary | ICD-10-CM | POA: Diagnosis not present

## 2019-11-01 DIAGNOSIS — K219 Gastro-esophageal reflux disease without esophagitis: Secondary | ICD-10-CM | POA: Insufficient documentation

## 2019-11-01 DIAGNOSIS — R188 Other ascites: Secondary | ICD-10-CM | POA: Insufficient documentation

## 2019-11-01 DIAGNOSIS — F17210 Nicotine dependence, cigarettes, uncomplicated: Secondary | ICD-10-CM | POA: Diagnosis not present

## 2019-11-01 DIAGNOSIS — K7460 Unspecified cirrhosis of liver: Secondary | ICD-10-CM | POA: Diagnosis not present

## 2019-11-01 DIAGNOSIS — R1084 Generalized abdominal pain: Secondary | ICD-10-CM | POA: Insufficient documentation

## 2019-11-01 DIAGNOSIS — Z7689 Persons encountering health services in other specified circumstances: Secondary | ICD-10-CM

## 2019-11-01 DIAGNOSIS — R1907 Generalized intra-abdominal and pelvic swelling, mass and lump: Secondary | ICD-10-CM | POA: Diagnosis present

## 2019-11-01 DIAGNOSIS — Z79899 Other long term (current) drug therapy: Secondary | ICD-10-CM | POA: Insufficient documentation

## 2019-11-01 DIAGNOSIS — K7031 Alcoholic cirrhosis of liver with ascites: Secondary | ICD-10-CM

## 2019-11-01 NOTE — Telephone Encounter (Signed)
Patient has presented to the ER today. Forwarded information to PA at the hospital.

## 2019-11-01 NOTE — ED Notes (Signed)
Translator number # [phone]

## 2019-11-01 NOTE — Progress Notes (Signed)
Pt complains of stomach pain  Pt complains that genitals are swollen

## 2019-11-01 NOTE — Telephone Encounter (Signed)
Donald Mire, NP at [facility] stated that pt came to them to establish care.  She advised that pt needs to be scheduled for an urgent paracentesis.

## 2019-11-01 NOTE — ED Triage Notes (Addendum)
Pt BIB [facility], sent by PCP for paracentesis (seen on 10/12/19 here for same), has hx of liver failure. A&O x4, NAD, abdominal distention and pain present.

## 2019-11-01 NOTE — Progress Notes (Signed)
New Patient Office Visit  Subjective:  Patient ID: Donald Aguilar, male    DOB: [birth year]  Age: 69 y.o. MRN: [id number]  CC:  Chief Complaint  Patient presents with  . New Patient (Initial Visit)    HPI Donald Aguilar has a interputor Donald Aguilar for Donald Aguilar this language is not available. 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 Patient states he has stop drinking 4 months ago. He has complaints of stomach pain and penile swelling and pain.  Past Medical History:  Diagnosis Date  . 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 GERD (gastroesophageal reflux disease)     Past Surgical History:  Procedure Laterality Date  . BIOPSY  09/13/2019   Procedure: BIOPSY;  Surgeon: Ladene Artist, MD;  Location: [facility] ENDOSCOPY;  Service: Endoscopy;;  . ESOPHAGOGASTRODUODENOSCOPY (EGD) WITH PROPOFOL N/A 09/13/2019   Procedure: ESOPHAGOGASTRODUODENOSCOPY (EGD) WITH PROPOFOL;  Surgeon: Ladene Artist, MD;  Location: [facility] ENDOSCOPY;  Service: Endoscopy;  Laterality: N/A;  . IR PARACENTESIS  09/12/2019  . IR PARACENTESIS  09/29/2019  . IR PARACENTESIS  10/19/2019    No family history on file.  Social History   Socioeconomic History  . Marital status: Married    Spouse name: Not on file  . Number of children: Not on file  . Years of education: Not on file  . Highest education level: Not on file  Occupational History  . Not on file  Tobacco Use  . Smoking status: Current Every Day Smoker    Years: 50.00    Types: Cigarettes  . Smokeless tobacco: Never Used  . Tobacco comment: approximately a couple of packs a week  Substance and Sexual Activity  . Alcohol use: Not Currently    Comment: last drank in September, 2020  . Drug use: Never  . Sexual activity: Not on file  Other Topics Concern  . Not on file  Social History Narrative  . Not on file   Social Determinants of Health   Financial Resource Strain:   . Difficulty  of Paying Living Expenses: Not on file  Food Insecurity:   . Worried About Charity fundraiser in the Last Year: Not on file  . Ran Out of Food in the Last Year: Not on file  Transportation Needs:   . Lack of Transportation (Medical): Not on file  . Lack of Transportation (Non-Medical): Not on file  Physical Activity:   . Days of Exercise per Week: Not on file  . Minutes of Exercise per Session: Not on file  Stress:   . Feeling of Stress : Not on file  Social Connections:   . Frequency of Communication with Friends and Family: Not on file  . Frequency of Social Gatherings with Friends and Family: Not on file  . Attends Religious Services: Not on file  . Active Member of Clubs or Organizations: Not on file  . Attends Archivist Meetings: Not on file  . Marital Status: Not on file  Intimate Partner Violence:   . Fear of Current or Ex-Partner: Not on file  . Emotionally Abused: Not on file  . Physically Abused: Not on file  . Sexually Abused: Not on file    ROS Review of Systems  Constitutional: Positive for fatigue and unexpected weight change.       Fluid  HENT: Positive for dental problem.   Eyes: Negative for visual disturbance.  Respiratory: Positive for  shortness of breath.   Cardiovascular: Negative.   Gastrointestinal: Positive for abdominal distention and abdominal pain.  Genitourinary: Positive for penile pain and penile swelling.  Psychiatric/Behavioral: Positive for decreased concentration and sleep disturbance. The patient is nervous/anxious.   All other systems reviewed and are negative.   Objective:   Today's Vitals: BP 110/76 (BP Location: Right Arm, Patient Position: Supine, Cuff Size: Small)   Pulse (!) 109   Temp (!) 97.3 F (36.3 C) (Temporal)   Ht 5\' 5"  (1.651 m)   Wt 135 lb 12.8 oz (61.6 kg)   SpO2 97%   BMI 22.60 kg/m   Physical Exam Vitals reviewed.  HENT:     Head: Normocephalic.     Right Ear: Tympanic membrane normal.      Nose: Nose normal.  Cardiovascular:     Rate and Rhythm: Normal rate and regular rhythm.  Pulmonary:     Effort: Pulmonary effort is normal.     Breath sounds: Normal breath sounds.  Abdominal:     General: There is distension.  Musculoskeletal:        General: Normal range of motion.     Cervical back: Normal range of motion and neck supple.  Skin:    General: Skin is warm and dry.  Neurological:     Mental Status: He is alert and oriented to person, place, and time.  Psychiatric:        Mood and Affect: Mood normal.        Behavior: Behavior normal.        Thought Content: Thought content normal.     Assessment & Plan:   Kimmy was seen today for new patient (initial visit).  Diagnoses and all orders for this visit:  Encounter to establish care Juluis Mire, [name] will be your  (PCP) mastered prepared that is able to that will  diagnosed and treatment able to answer health concern as well as continuing care of varied medical conditions, not limited by cause, organ system, or diagnosis.   Smoking greater than 40 pack years He is made aware of increased risk for lung cancer and other respiratory diseases recommend cessation.  This will be reminded at each clinical visit.  Ascites due to alcoholic cirrhosis (HCC) Contacted GI for advices patient is swollen from sternum to thighs to including scrotum. Unable to breath effectively without lying down    Hydrocele, acquired Secondary from ascites. Penile and scrotum   Outpatient Encounter Medications as of 11/01/2019  Medication Sig  . furosemide (LASIX) 40 MG tablet Take 1 tablet (40 mg total) by mouth daily.  . metroNIDAZOLE (FLAGYL) 250 MG tablet Take 250 mg by mouth 4 (four) times daily.  . pantoprazole (PROTONIX) 40 MG tablet Take 1 tablet (40 mg total) by mouth daily.  Marland Kitchen spironolactone (ALDACTONE) 50 MG tablet Take 1 tablet (50 mg total) by mouth daily.  Marland Kitchen spironolactone (ALDACTONE) 50 MG tablet Take 1 tablet (50 mg  total) by mouth daily.  . [DISCONTINUED] doxycycline (DORYX) 100 MG EC tablet Take 100 mg by mouth 2 (two) times daily.   Facility-Administered Encounter Medications as of 11/01/2019  Medication  . albumin human 25 % solution 12.5 g   Called non emergency to take patient to hospital for parerethisis no return call from IR or GI   Follow-up: No follow-ups on file.   Kerin Perna, NP

## 2019-11-01 NOTE — ED Notes (Signed)
Karie Mainland (interpreter in car outside) [phone]

## 2019-11-02 ENCOUNTER — Other Ambulatory Visit: Payer: Self-pay

## 2019-11-02 ENCOUNTER — Emergency Department (HOSPITAL_COMMUNITY): Payer: Medicare Other

## 2019-11-02 DIAGNOSIS — K7460 Unspecified cirrhosis of liver: Secondary | ICD-10-CM

## 2019-11-02 DIAGNOSIS — R188 Other ascites: Secondary | ICD-10-CM | POA: Diagnosis not present

## 2019-11-02 HISTORY — PX: IR PARACENTESIS: IMG2679

## 2019-11-02 LAB — CBC WITH DIFFERENTIAL/PLATELET
Abs Immature Granulocytes: 0.02 10*3/uL (ref 0.00–0.07)
Basophils Absolute: 0.1 10*3/uL (ref 0.0–0.1)
Basophils Relative: 1 %
Eosinophils Absolute: 0.1 10*3/uL (ref 0.0–0.5)
Eosinophils Relative: 2 %
HCT: 35.6 % — ABNORMAL LOW (ref 39.0–52.0)
Hemoglobin: 12 g/dL — ABNORMAL LOW (ref 13.0–17.0)
Immature Granulocytes: 0 %
Lymphocytes Relative: 26 %
Lymphs Abs: 1.6 10*3/uL (ref 0.7–4.0)
MCH: 33.7 pg (ref 26.0–34.0)
MCHC: 33.7 g/dL (ref 30.0–36.0)
MCV: 100 fL (ref 80.0–100.0)
Monocytes Absolute: 0.5 10*3/uL (ref 0.1–1.0)
Monocytes Relative: 8 %
Neutro Abs: 3.8 10*3/uL (ref 1.7–7.7)
Neutrophils Relative %: 63 %
Platelets: 226 10*3/uL (ref 150–400)
RBC: 3.56 MIL/uL — ABNORMAL LOW (ref 4.22–5.81)
RDW: 16.1 % — ABNORMAL HIGH (ref 11.5–15.5)
WBC: 6.1 10*3/uL (ref 4.0–10.5)
nRBC: 0 % (ref 0.0–0.2)

## 2019-11-02 LAB — COMPREHENSIVE METABOLIC PANEL
ALT: 26 U/L (ref 0–44)
AST: 67 U/L — ABNORMAL HIGH (ref 15–41)
Albumin: 2.1 g/dL — ABNORMAL LOW (ref 3.5–5.0)
Alkaline Phosphatase: 64 U/L (ref 38–126)
Anion gap: 10 (ref 5–15)
BUN: 6 mg/dL — ABNORMAL LOW (ref 8–23)
CO2: 28 mmol/L (ref 22–32)
Calcium: 8 mg/dL — ABNORMAL LOW (ref 8.9–10.3)
Chloride: 95 mmol/L — ABNORMAL LOW (ref 98–111)
Creatinine, Ser: 0.69 mg/dL (ref 0.61–1.24)
GFR calc Af Amer: >60 mL/min (ref >60)
GFR calc non Af Amer: >60 mL/min (ref >60)
Glucose, Bld: 107 mg/dL — ABNORMAL HIGH (ref 70–99)
Potassium: 3 mmol/L — ABNORMAL LOW (ref 3.5–5.1)
Sodium: 133 mmol/L — ABNORMAL LOW (ref 135–145)
Total Bilirubin: 2 mg/dL — ABNORMAL HIGH (ref 0.3–1.2)
Total Protein: 6.9 g/dL (ref 6.5–8.1)

## 2019-11-02 LAB — LIPASE, BLOOD: Lipase: 37 U/L (ref 11–51)

## 2019-11-02 LAB — PROTIME-INR
INR: 1.6 — ABNORMAL HIGH (ref 0.8–1.2)
Prothrombin Time: 18.9 seconds — ABNORMAL HIGH (ref 11.4–15.2)

## 2019-11-02 IMAGING — US IR PARACENTESIS
1 series · 3 of 3 positions shown · non-contrast
Comparison: none

INDICATION: Patient with history of decompensated cirrhosis with recurrent
ascites who presented to the ED today with complaints of abdominal
pain and distension. Request to IR for therapeutic paracentesis with
4 L maximum.

[Series 1: (id) · 3 acquisitions, 3 frames shown]
[im 1/3]
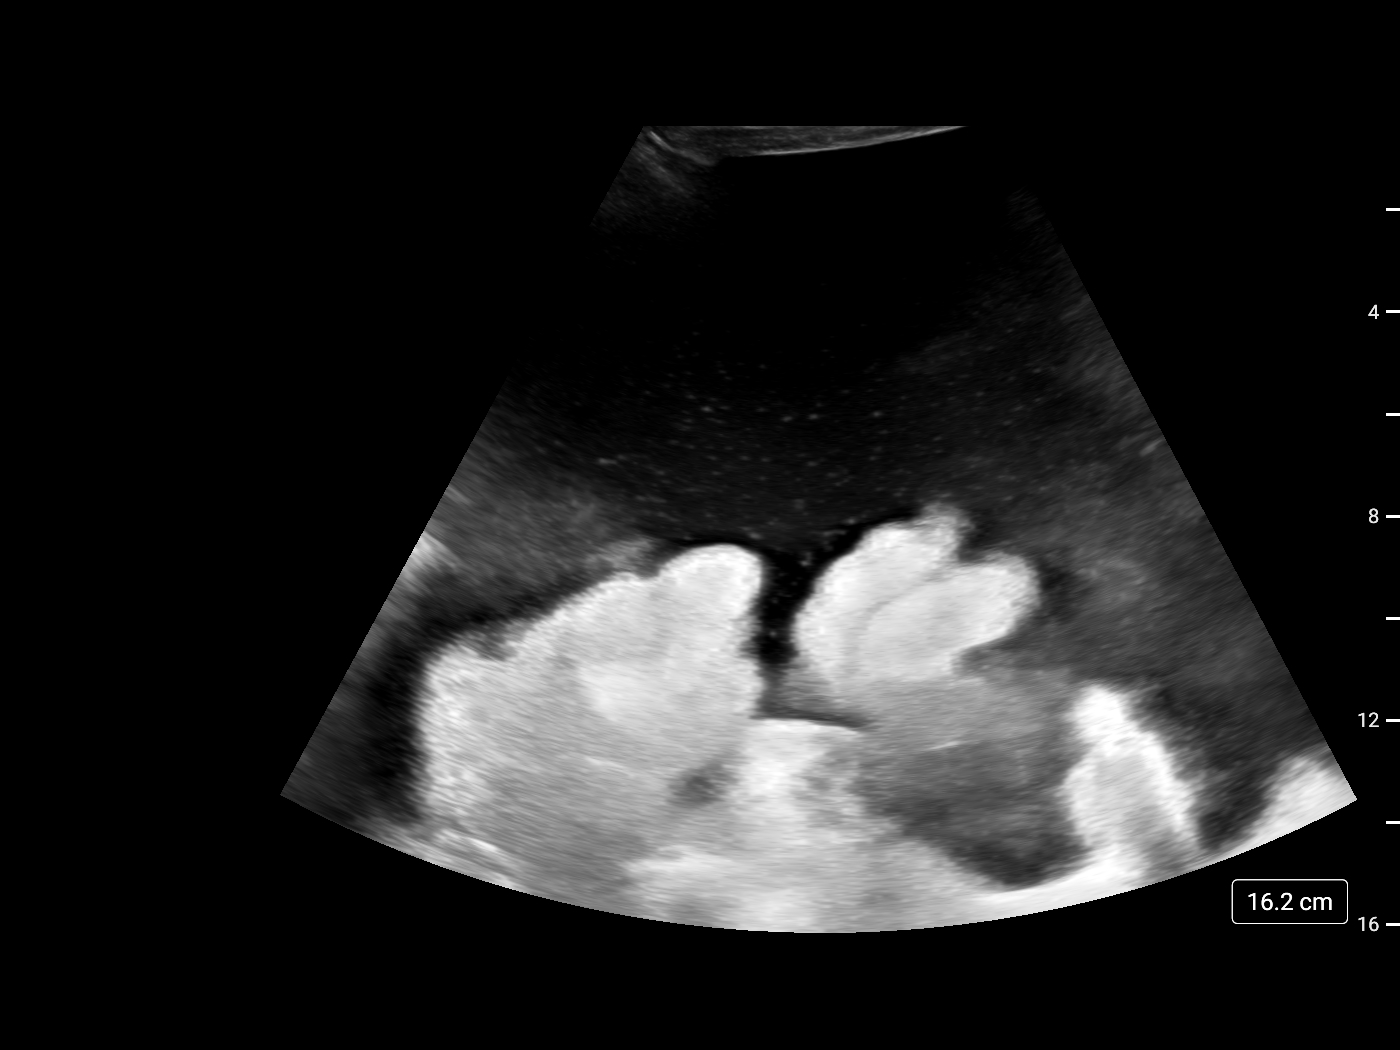
[im 2/3]
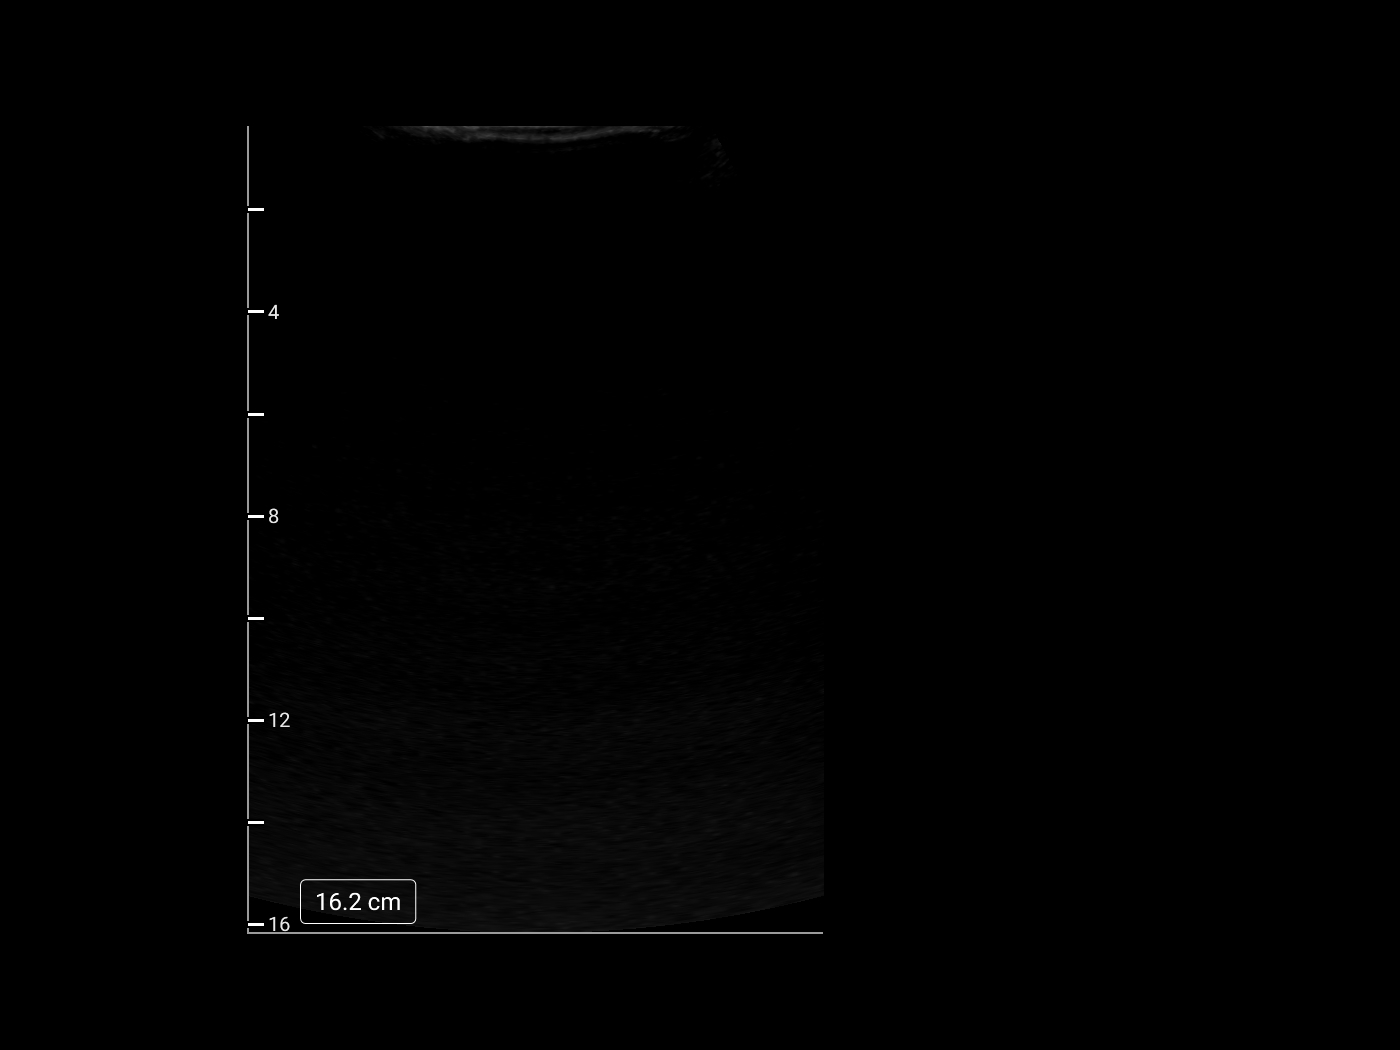
[im 3/3]
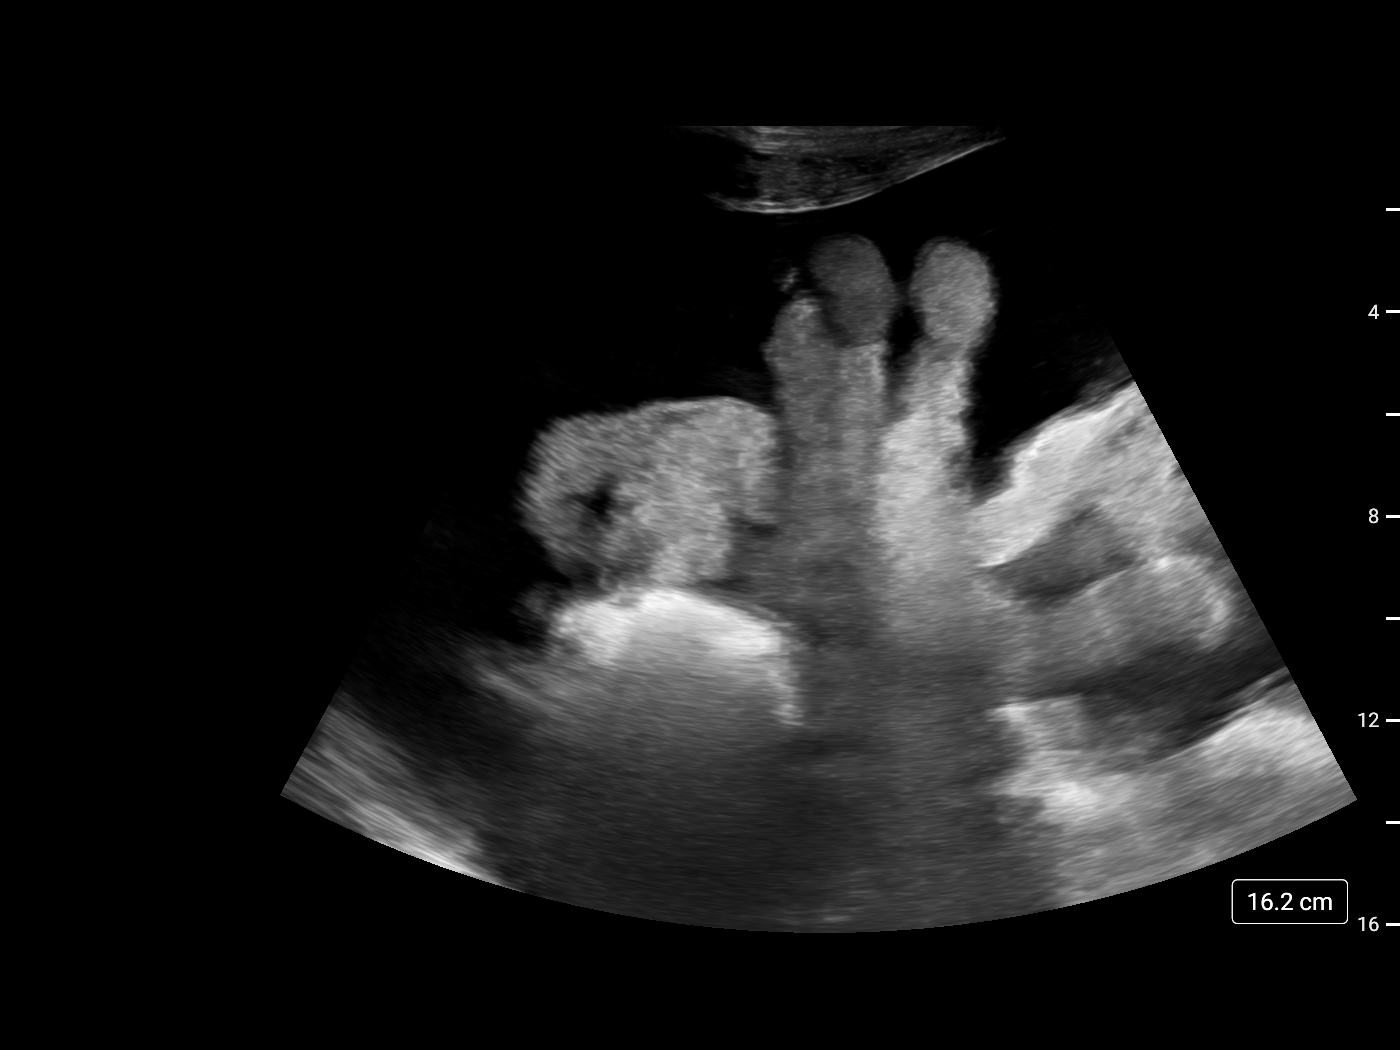

[3 of 3 positions shown; findings below may reference images not displayed]

EXAM:
ULTRASOUND GUIDED THERAPEUTIC PARACENTESIS

MEDICATIONS:
10 mL 1% lidocaine

COMPLICATIONS:
None immediate.

PROCEDURE:
Informed written consent was obtained from the patient after a
discussion of the risks, benefits and alternatives to treatment. A
timeout was performed prior to the initiation of the procedure.

Initial ultrasound scanning demonstrates a large amount of ascites
within the right lower abdominal quadrant. The right lower abdomen
was prepped and draped in the usual sterile fashion. 1% lidocaine
was used for local anesthesia.

Following this, a 19 gauge, 7-cm, Yueh catheter was introduced. An
ultrasound image was saved for documentation purposes. The
paracentesis was performed. The catheter was removed and a dressing
was applied. The patient tolerated the procedure well without
immediate post procedural complication.
FINDINGS: A total of approximately 4.0 L of clear, yellow fluid was removed.
IMPRESSION: Successful ultrasound-guided paracentesis yielding 4.0 liters of
peritoneal fluid.

Read by GARRY

## 2019-11-02 MED ORDER — POTASSIUM CHLORIDE CRYS ER 20 MEQ PO TBCR
40.0000 meq | EXTENDED_RELEASE_TABLET | Freq: Once | ORAL | Status: AC
  Administered 2019-11-02: 11:00:00 40 meq via ORAL
  Filled 2019-11-02: qty 2

## 2019-11-02 MED ORDER — LIDOCAINE HCL (PF) 1 % IJ SOLN
INTRAMUSCULAR | Status: AC | PRN
  Administered 2019-11-02: 10 mL

## 2019-11-02 MED ORDER — LIDOCAINE HCL 1 % IJ SOLN
INTRAMUSCULAR | Status: AC
  Filled 2019-11-02: qty 20

## 2019-11-02 MED ORDER — ALBUMIN HUMAN 5 % IV SOLN: 12.5000 g | INTRAVENOUS | Status: DC | PRN

## 2019-11-02 NOTE — Telephone Encounter (Signed)
See Dr. Loletha Carrow suggestion below- I am not sure he is actually taking the medications were prescribing him. Yes try to get him scheduled for large-volume paracentesis early next week, can be before the office visit remove up to 7 L and be sure he gets IV albumin afterwards, not sure the ER doctor would have done that.  Then arrange for scheduled paracenteses every 10 to 14 days.  Go ahead and schedule the appointment with Dr. Loletha Carrow in mid February and he can keep his appointment with me next week.  I would like to try to prevent him from frequent ER visits for his care.

## 2019-11-02 NOTE — Procedures (Signed)
PROCEDURE SUMMARY:  Successful image-guided paracentesis from the right lower abdomen.  Yielded 4.0 liters of clear yellow fluid which was the requested maximum - significant residual fluid remains on post procedure [place].  No immediate complications.  EBL: zero Patient tolerated well.   Specimen was not sent for labs.  Please see imaging section of Epic for full dictation.  Joaquim Nam [name] 11/02/2019 8:22 AM

## 2019-11-02 NOTE — Telephone Encounter (Signed)
Ms. Werner Lean calls back. Discussed these issues with her. She is going to make a home visit with the patient tomorrow if he agrees. She will instruct patient on the medication change. Aldactone 100 mg daily Also write down his appointments for him. She has my contact information.

## 2019-11-02 NOTE — Telephone Encounter (Signed)
Donald Aguilar, lets go ahead and schedule him for another large-volume paracentesis in 8 to 10 days.  Remove up to 7 L. He will need albumin infusion, 50 g at time of paracentesis.  I think okay to go ahead and schedule for large-volume paracentesis on scheduled basis every 2 weeks thereafter. He will need albumin infusion as above with each paracentesis.  He needs repeat labs in 2 weeks- CMEt and ammonia  Please schedule him a follow-up office visit with Dr. Loletha Carrow as possible to Dr. Loletha Carrow in the loop.  Thank you

## 2019-11-02 NOTE — Telephone Encounter (Addendum)
See procedure notes from paracentesis. He had 4 liters removed. No labs ordered on the fluid. Note indicates " significant residual fluid remains on post procedure [place]."

## 2019-11-02 NOTE — Telephone Encounter (Signed)
Arranged for the following appointments; IR Paracentesis on Thursday 11/10/19 and Monday 11/21/19. Both to be done at [facility]. He has had his previous paracenteses done at [facility]. Called the [employer]. Called out for the patient. No answer and no voicemail.  Reaching out to the Congregational Nurse for help. Asking if there is a method being used to identify his medications. Will he know which medication is Spironolactone by that name?

## 2019-11-02 NOTE — ED Provider Notes (Signed)
[facility] EMERGENCY DEPARTMENT Provider Note   CSN: [id number] Arrival date & time: 11/01/19  1609     History No chief complaint on file.   Donald Aguilar is a 69 y.o. male with a past medical history of cirrhosis with ascites presenting to the ED with a chief complaint of abdominal distention.  Noticed for the past 2 weeks he had gradually worsening abdominal distention.  States this feels similar to when he was diagnosed with cirrhosis last month and underwent a paracentesis.  He denies any nausea, vomiting, diarrhea, fevers, chest pain.  History is limited as we used a phone interpreter for [name].  HPI     Past Medical History:  Diagnosis Date  . 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 GERD (gastroesophageal reflux disease)     Patient Active Problem List   Diagnosis Date Noted  . Hepatitis C antibody test positive 09/28/2019  . Abnormal CT of the abdomen   . Erosive gastritis   . Cirrhosis of liver ([place]) 09/09/2019  . Ascites 09/09/2019  . Shortness of breath 09/09/2019    Past Surgical History:  Procedure Laterality Date  . BIOPSY  09/13/2019   Procedure: BIOPSY;  Surgeon: Ladene Artist, MD;  Location: [facility] ENDOSCOPY;  Service: Endoscopy;;  . ESOPHAGOGASTRODUODENOSCOPY (EGD) WITH PROPOFOL N/A 09/13/2019   Procedure: ESOPHAGOGASTRODUODENOSCOPY (EGD) WITH PROPOFOL;  Surgeon: Ladene Artist, MD;  Location: [facility] ENDOSCOPY;  Service: Endoscopy;  Laterality: N/A;  . IR PARACENTESIS  09/12/2019  . IR PARACENTESIS  09/29/2019  . IR PARACENTESIS  10/19/2019  . IR PARACENTESIS  11/02/2019       No family history on file.  Social History   Tobacco Use  . Smoking status: Current Every Day Smoker    Years: 50.00    Types: Cigarettes  . Smokeless tobacco: Never Used  . Tobacco comment: approximately a couple of packs a week  Substance Use Topics  . Alcohol use: Not Currently    Comment: last drank in September, 2020  . Drug use:  Never    Home Medications Prior to Admission medications   Medication Sig Start Date End Date Taking? Authorizing Provider  furosemide (LASIX) 40 MG tablet Take 1 tablet (40 mg total) by mouth daily. 10/19/19  Yes Esterwood, Amy S, [name]  pantoprazole (PROTONIX) 40 MG tablet Take 1 tablet (40 mg total) by mouth daily. 09/16/19 12/15/19 Yes Dahal, Marlowe Aschoff, MD  spironolactone (ALDACTONE) 50 MG tablet Take 1 tablet (50 mg total) by mouth daily. 10/19/19  Yes Esterwood, Amy S, [name]  albumin human 5 % bottle Inject 250 mLs (12.5 g total) into the vein as needed. For paracentesis greater than 4L 11/02/19   Curatolo, Adam, DO    Allergies    Patient has no known allergies.  Review of Systems   Review of Systems  Constitutional: Negative for appetite change, chills and fever.  HENT: Negative for ear pain, rhinorrhea, sneezing and sore throat.   Eyes: Negative for photophobia and visual disturbance.  Respiratory: Negative for cough, chest tightness, shortness of breath and wheezing.   Cardiovascular: Negative for chest pain and palpitations.  Gastrointestinal: Positive for abdominal distention and abdominal pain. Negative for blood in stool, constipation, diarrhea, nausea and vomiting.  Genitourinary: Negative for dysuria, hematuria and urgency.  Musculoskeletal: Negative for myalgias.  Skin: Negative for rash.  Neurological: Negative for dizziness, weakness and light-headedness.    Physical Exam Updated Vital Signs BP 105/75 (BP Location: Left Arm)  Pulse 93   Temp 97.7 F (36.5 C) (Oral)   Resp 16   SpO2 98%   Physical Exam Vitals and nursing note reviewed.  Constitutional:      General: He is not in acute distress.    Appearance: He is well-developed.  HENT:     Head: Normocephalic and atraumatic.     Nose: Nose normal.  Eyes:     General: No scleral icterus.       Left eye: No discharge.     Conjunctiva/sclera: Conjunctivae normal.  Cardiovascular:     Rate and Rhythm:  Normal rate and regular rhythm.     Heart sounds: Normal heart sounds. No murmur. No friction rub. No gallop.   Pulmonary:     Effort: Pulmonary effort is normal. No respiratory distress.     Breath sounds: Normal breath sounds.  Abdominal:     General: Bowel sounds are normal. There is distension.     Palpations: Abdomen is soft.     Tenderness: There is abdominal tenderness (generalized). There is no guarding.  Musculoskeletal:        General: Normal range of motion.     Cervical back: Normal range of motion and neck supple.  Skin:    General: Skin is warm and dry.     Findings: No rash.  Neurological:     Mental Status: He is alert.     Motor: No abnormal muscle tone.     Coordination: Coordination normal.     ED Results / Procedures / Treatments   Labs (all labs ordered are listed, but only abnormal results are displayed) Labs Reviewed  CBC WITH DIFFERENTIAL/PLATELET - Abnormal; Notable for the following components:      Result Value   RBC 3.56 (*)    Hemoglobin 12.0 (*)    HCT 35.6 (*)    RDW 16.1 (*)    All other components within normal limits  COMPREHENSIVE METABOLIC PANEL - Abnormal; Notable for the following components:   Sodium 133 (*)    Potassium 3.0 (*)    Chloride 95 (*)    Glucose, Bld 107 (*)    BUN 6 (*)    Calcium 8.0 (*)    Albumin 2.1 (*)    AST 67 (*)    Total Bilirubin 2.0 (*)    All other components within normal limits  PROTIME-INR - Abnormal; Notable for the following components:   Prothrombin Time 18.9 (*)    INR 1.6 (*)    All other components within normal limits  LIPASE, BLOOD    EKG None  Radiology IR Paracentesis  Result Date: 11/02/2019 INDICATION: Patient with history of decompensated cirrhosis with recurrent ascites who presented to the ED today with complaints of abdominal pain and distension. Request to IR for therapeutic paracentesis with 4 L maximum. EXAM: ULTRASOUND GUIDED THERAPEUTIC PARACENTESIS MEDICATIONS: 10 mL 1%  lidocaine COMPLICATIONS: None immediate. PROCEDURE: Informed written consent was obtained from the patient after a discussion of the risks, benefits and alternatives to treatment. A timeout was performed prior to the initiation of the procedure. Initial ultrasound scanning demonstrates a large amount of ascites within the right lower abdominal quadrant. The right lower abdomen was prepped and draped in the usual sterile fashion. 1% lidocaine was used for local anesthesia. Following this, a 19 gauge, 7-cm, Yueh catheter was introduced. An ultrasound image was saved for documentation purposes. The paracentesis was performed. The catheter was removed and a dressing was applied. The patient tolerated the  procedure well without immediate post procedural complication. FINDINGS: A total of approximately 4.0 L of clear, yellow fluid was removed. IMPRESSION: Successful ultrasound-guided paracentesis yielding 4.0 liters of peritoneal fluid. Read by Candiss Norse, [name] Electronically Signed   By: [name] M.D.   On: 11/02/2019 08:54    Procedures Procedures (including critical care time)  Medications Ordered in ED Medications  lidocaine (XYLOCAINE) 1 % (with pres) injection (has no administration in time range)  lidocaine (PF) (XYLOCAINE) 1 % injection (10 mLs Infiltration Given 11/02/19 0813)  potassium chloride SA (KLOR-CON) CR tablet 40 mEq (40 mEq Oral Given 11/02/19 1034)    ED Course  I have reviewed the triage vital signs and the nursing notes.  Pertinent labs & imaging results that were available during my care of the patient were reviewed by me and considered in my medical decision making (see chart for details).  Clinical Course as of Nov 01 1050  Wed Nov 02, 2019  0907 Patient returned from IR paracentesis with significant improvement in his distention and pain.  He is resting comfortably.  Still awaiting lab work.   [HK]    Clinical Course User Index [HK] Delia Heady, [name]   MDM  Rules/Calculators/A&P                      79-year-old male with a past medical history of cirrhosis presenting to the ED with ascites.  Reports abdominal distention and generalized pain.  States that he was sent by his PCP for paracentesis as he had one last month when he was first diagnosed with cirrhosis.  Denies any fever, changes to bowel movements, vomiting. Abdominal distension noted on exam.  He is afebrile here with no recent use of antipyretics.  Will obtain lab work, order IR paracentesis and reassess.  Patient returned from paracentesis with significant improvement in his distention and pain.  Resting comfortably and able to tolerate p.o. intake without difficulty.  Hypokalemia was treated p.o.  Other lab work is similar to prior.  Explained to patient that we have been able to order him outpatient paracentesis scheduled so that he does not have to return throughout the ER.  Given strict return precautions.  Of note, it does not appear the patient needs to receive albumin if less than 5 L of fluid were taken off.  There were 4 L taken off of him today.  Patient is hemodynamically stable, in NAD, and able to ambulate in the ED. Evaluation does not show pathology that would require ongoing emergent intervention or inpatient treatment. I explained the diagnosis to the patient. Pain has been managed and has no complaints prior to discharge. Patient is comfortable with above plan and is stable for discharge at this time. All questions were answered prior to disposition. Strict return precautions for returning to the ED were discussed. Encouraged follow up with PCP.   An After Visit Summary was printed and given to the patient.   Portions of this note were generated with Lobbyist. Dictation errors may occur despite best attempts at proofreading.   Final Clinical Impression(s) / ED Diagnoses Final diagnoses:  Ascites    Rx / DC Orders ED Discharge Orders         Ordered     albumin human 5 % bottle  As needed     11/02/19 0705    IR Paracentesis     11/02/19 0705           Lynell Greenhouse,  Khadir Roam, [name] 11/02/19 1054    Lennice Sites, DO 11/02/19 1609

## 2019-11-02 NOTE — Discharge Instructions (Addendum)
We have scheduled so that you can receive your IR paracentesis without coming to the emergency department. You do not need to pick up the prescription from the pharmacy as this is only needed after your paracentesis. Follow-up with a primary care provider.   Return to the ED if you start to develop a fever, worsening abdominal pain, vomiting up blood, injuries or falls.

## 2019-11-02 NOTE — ED Notes (Signed)
PIVC placed on the LAC with a 20G which had positive blood return and flushed without pain or infiltration. Blood collected, labeled, and sent to lab.

## 2019-11-02 NOTE — Telephone Encounter (Signed)
I recommend that the next paracentesis be done early next week, up to 7 liters with 50 grams IV albumin as outlined.  At the same time. arrange another paracentesis about 10 days after that with same instructions.  Increase spironolactone to 100 mg once daily to help with ascites and because potassium level was low yesterday.

## 2019-11-02 NOTE — Telephone Encounter (Signed)
The patient was told by the ED doctor (Dr Ronnald Nian) to return for paracentesis in 2 weeks. A standing order was placed for IR paracentesis maximum fluid removal 4 liters by Dr Ronnald Nian. The patient was previously scheduled for return to care here with Amy Esterwood for 11/08/19 at his last appointment. Dr Loletha Carrow has an opening on 12/09/19, but not sooner.  I will call the patient. Do you want me to try to get the paracentesis appointment for later next week sometime after the office visit on 11/08/19? I would like to minimize any back and forth with the patient due to the significant difficulty with clear communication and understanding.

## 2019-11-02 NOTE — ED Notes (Signed)
Patient transported to X-ray- pericentesis.

## 2019-11-03 ENCOUNTER — Other Ambulatory Visit: Payer: Self-pay | Admitting: Gastroenterology

## 2019-11-03 ENCOUNTER — Other Ambulatory Visit: Payer: Self-pay

## 2019-11-03 NOTE — Congregational Nurse Program (Signed)
Home visit following phone call from nurse Beth at [facility].  She stated his doctor wants him to increase the Spironolactone from 50 mg to 100 mg per day.  She is unsure how to communicate this to patient due to language barrier.  CN explained to patient and care giver Y'Phi Mlo and marked pill bottle with new orders.  He also needed a refill for Pantoprazole.  Phone to pharmacy who will request refill and also left message at [facility]. Patient states he feels better since paracentesis yesterday.  Gave patient list of his next 3 appointments for MD follow-up and paracentesis at [facility].  [name] RN, Congregational Nurse [phone]

## 2019-11-03 NOTE — Telephone Encounter (Signed)
Congregational nurse left a voicemail. She has visited the patient. He has been given the information about the appointments and the increase of the Aldactone.  We greatly appreciate her help and involvement with this patient.

## 2019-11-03 NOTE — Telephone Encounter (Signed)
That's great - hopefully she can continue to follow him and help with communication

## 2019-11-08 ENCOUNTER — Ambulatory Visit (INDEPENDENT_AMBULATORY_CARE_PROVIDER_SITE_OTHER): Payer: Medicare Other | Admitting: Physician Assistant

## 2019-11-08 ENCOUNTER — Other Ambulatory Visit (INDEPENDENT_AMBULATORY_CARE_PROVIDER_SITE_OTHER): Payer: Medicare Other

## 2019-11-08 ENCOUNTER — Encounter: Payer: Self-pay | Admitting: Physician Assistant

## 2019-11-08 VITALS — BP 80/60 | HR 107 | Temp 97.8°F | Ht 65.0 in | Wt 130.0 lb

## 2019-11-08 DIAGNOSIS — K7290 Hepatic failure, unspecified without coma: Secondary | ICD-10-CM | POA: Diagnosis not present

## 2019-11-08 DIAGNOSIS — K7460 Unspecified cirrhosis of liver: Secondary | ICD-10-CM | POA: Diagnosis not present

## 2019-11-08 DIAGNOSIS — R188 Other ascites: Secondary | ICD-10-CM

## 2019-11-08 LAB — BASIC METABOLIC PANEL
BUN: 13 mg/dL (ref 6–23)
CO2: 31 mEq/L (ref 19–32)
Calcium: 8.6 mg/dL (ref 8.4–10.5)
Chloride: 96 mEq/L (ref 96–112)
Creatinine, Ser: 0.85 mg/dL (ref 0.40–1.50)
GFR: 89.93 mL/min (ref >60.00)
Glucose, Bld: 101 mg/dL — ABNORMAL HIGH (ref 70–99)
Potassium: 3.4 mEq/L — ABNORMAL LOW (ref 3.5–5.1)
Sodium: 133 mEq/L — ABNORMAL LOW (ref 135–145)

## 2019-11-08 LAB — AMMONIA: Ammonia: 37 umol/L — ABNORMAL HIGH (ref 11–35)

## 2019-11-08 NOTE — Patient Instructions (Addendum)
If you are age 67 or older, your body mass index should be between 23-30. Your Body mass index is 21.63 kg/m. If this is out of the aforementioned range listed, please consider follow up with your Primary Care Provider.  If you are age 75 or younger, your body mass index should be between 19-25. Your Body mass index is 21.63 kg/m. If this is out of the aformentioned range listed, please consider follow up with your Primary Care Provider.    Your provider has requested that you go to the basement level for lab work before leaving today. Press "B" on the elevator. The lab is located at the first door on the left as you exit the elevator.  Rescheduled Paracentesis for Tuesday 11/22/19 @ 1 pm.   Continue Lasix 40 mg in the morning Continue Spironolactone 50 mg - 2 tablets in the morning. Continue Protonix 40 mg in the morning. Add Tylenol PM 1 tablet at bedtime for sleep. No more than 1 tablet.

## 2019-11-08 NOTE — Progress Notes (Signed)
____________________________________________________________  Attending physician addendum:  Thank you for sending this case to me. I have reviewed the entire note, and the outlined plan seems appropriate.  Managing his volume overload will continue to be a challenge.  It seems like we are making progress with it, but I am concerned that his renal function is likely to be sensitive to a combination of diuretics and large volume paracentesis. As such, I will wait until his follow-up with me in the next 2 paracentesis to assess his renal function decide whether we can try to increase the diuretic doses.  Wilfrid Lund, MD  ____________________________________________________________

## 2019-11-08 NOTE — Progress Notes (Signed)
Subjective:    Patient ID: Donald Aguilar, male    DOB: [birth year], 69 y.o.   MRN: [id number]  HPI Donald Aguilar is a 69 year old [place] male, established with Dr. Loletha Carrow and myself.  He was last seen in the office on 10/19/2019.  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 He has had problems with massive ascites, and underwent large-volume paracentesis on 09/29/2019, this was repeated on 10/19/2019 with removal of 5 L and again on 11/01/2019 with removal of 4 L. We have now arranged for him to have serial paracenteses. He has been on Lasix 40 mg p.o. daily and Aldactone was increased to 100 mg daily last week. Patient and friend both state that he has been taking Donald Aguilar medication regularly. Donald Aguilar weight is down 29 pounds since last office visit.  He says he feels better and Donald Aguilar belly is not as tight.  He no longer has any peripheral edema.  He says he still gets full very quickly and has not been eating very much.  He also has been having difficulty sleeping.  It sounds as if he takes a nap for an hour or so during the day but says he is not sleeping well at all at night over the past few months. Most recent labs on 11/02/2019 INR of 1.6, sodium 133, potassium 3, creatinine 0.69/T bili 2.0/AST of 67.  He says today that he does get anxious at times and wonders how long he will live.  He denies any current EtOH use. Referrals have been sent for consult with ID regarding hep C treatment.  Review of Systems Pertinent positive and negative review of systems were noted in the above HPI section.  All other review of systems was otherwise negative.  Outpatient Encounter Medications as of 11/08/2019  Medication Sig  . albumin human 5 % bottle Inject 250 mLs (12.5 g total) into the vein as needed. For paracentesis greater than 4L  . furosemide (LASIX) 40 MG tablet Take 1 tablet (40 mg total) by mouth daily.  . pantoprazole (PROTONIX) 40 MG tablet TAKE 1  BY MOUTH TWICE  DAILY  . spironolactone (ALDACTONE) 50 MG tablet Take 1 tablet (50 mg total) by mouth daily. (Patient taking differently: Take 100 mg by mouth daily. Take 2 tablets once a day)   No facility-administered encounter medications on file as of 11/08/2019.   No Known Allergies Patient Active Problem List   Diagnosis Date Noted  . Hepatitis C antibody test positive 09/28/2019  . Abnormal CT of the abdomen   . Erosive gastritis   . Cirrhosis of liver ([place]) 09/09/2019  . Ascites 09/09/2019  . Shortness of breath 09/09/2019   Social History   Socioeconomic History  . Marital status: Married    Spouse name: Not on file  . Number of children: Not on file  . Years of education: Not on file  . Highest education level: Not on file  Occupational History  . Not on file  Tobacco Use  . Smoking status: Current Every Day Smoker    Years: 50.00    Types: Cigarettes  . Smokeless tobacco: Never Used  . Tobacco comment: approximately a couple of packs a week  Substance and Sexual Activity  . Alcohol use: Not Currently    Comment: last drank in September, 2020  . Drug use: Never  . Sexual activity: Not on file  Other Topics Concern  . Not on file  Social History Narrative  . Not  on file   Social Determinants of Health   Financial Resource Strain:   . Difficulty of Paying Living Expenses: Not on file  Food Insecurity:   . Worried About Charity fundraiser in the Last Year: Not on file  . Ran Out of Food in the Last Year: Not on file  Transportation Needs:   . Lack of Transportation (Medical): Not on file  . Lack of Transportation (Non-Medical): Not on file  Physical Activity:   . Days of Exercise per Week: Not on file  . Minutes of Exercise per Session: Not on file  Stress:   . Feeling of Stress : Not on file  Social Connections:   . Frequency of Communication with Friends and Family: Not on file  . Frequency of Social Gatherings with Friends and Family: Not on file  . Attends  Religious Services: Not on file  . Active Member of Clubs or Organizations: Not on file  . Attends Archivist Meetings: Not on file  . Marital Status: Not on file  Intimate Partner Violence:   . Fear of Current or Ex-Partner: Not on file  . Emotionally Abused: Not on file  . Physically Abused: Not on file  . Sexually Abused: Not on file    Mr. Kozikowski family history is not on file.      Objective:    Vitals:   11/08/19 0955  BP: (!) 80/60  Pulse: (!) 107  Temp: 97.8 F (36.6 C)    Physical Exam Well-developed chronically ill-appearing Asian male in no acute distress.  Accompanied by Donald Aguilar friend and an interpreter, Weight, 130 BMI 21.6  HEENT; nontraumatic normocephalic, EOMI, PER R LA, sclera anicteric. Oropharynx; not examined/mass/Covid Neck; supple, no JVD Cardiovascular;  tachy regular rate and rhythm with S1-S2, no murmur rub or gallop Pulmonary; few bibasilar crackles Abdomen; protuberant, with large amount of ascites, not as tense as on prior exams, no palpable mass or hepatosplenomegaly, bowel sounds are active.  No focal tenderness Rectal; not done today Skin; benign exam, no jaundice rash or appreciable lesions Extremities; no clubbing cyanosis or edema skin warm and dry Neuro/Psych; alert and oriented x4, grossly nonfocal mood and affect appropriate.  No asterixis       Assessment & Plan:   #49 69 year old [place] male with decompensated cirrhosis secondary to hep C and EtOH.  He presented with anasarca.  He has now undergone several paracenteses and it appears he has been taking Donald Aguilar diuretics. Weight is down about 30 pounds, he still has a significant amount of ascites but no longer has any peripheral edema. Current M ELD=14  #2 grade 2 esophageal varices and moderate portal gastropathy, last EGD November 2020  Plan; BMEt, ammonia today Continue Lasix 40 mg p.o. daily Continue Aldactone 2 mg tablet, 2 p.o. every morning Continue Protonix 40  mg p.o. every morning We discussed eating smaller more frequent meals as means to getting more nutrition and calories.  Discussed adding Ensure or boost supplements but he does not feel he can afford those. Patient is scheduled for follow-up large-volume paracentesis on 11/10/2019, and again on 11/22/2019. Follow-up office visit with Dr. Loletha Carrow in 3-4 weeks  Disa Riedlinger Genia Harold [name] 11/08/2019   Cc: No ref. provider found

## 2019-11-10 ENCOUNTER — Other Ambulatory Visit: Payer: Self-pay

## 2019-11-10 ENCOUNTER — Ambulatory Visit (HOSPITAL_COMMUNITY)
Admission: RE | Admit: 2019-11-10 | Discharge: 2019-11-10 | Disposition: A | Discharge status: Home / Self Care | Payer: Medicare Other | Source: Ambulatory Visit | Attending: Gastroenterology | Admitting: Gastroenterology

## 2019-11-10 DIAGNOSIS — R188 Other ascites: Secondary | ICD-10-CM | POA: Diagnosis not present

## 2019-11-10 DIAGNOSIS — K7460 Unspecified cirrhosis of liver: Secondary | ICD-10-CM | POA: Diagnosis present

## 2019-11-10 HISTORY — PX: IR PARACENTESIS: IMG2679

## 2019-11-10 IMAGING — US IR PARACENTESIS
1 series · 2 of 2 positions shown · non-contrast
Comparison: none

INDICATION: Patient with history of decompensated cirrhosis, recurrent ascites
requiring frequent paracentesis. Request IR for therapeutic
paracentesis with 7 L maximum and post procedure albumin
administration per IR protocol.

[Series 1: (id) · 2 of 2 slices shown]
[im 1/2]
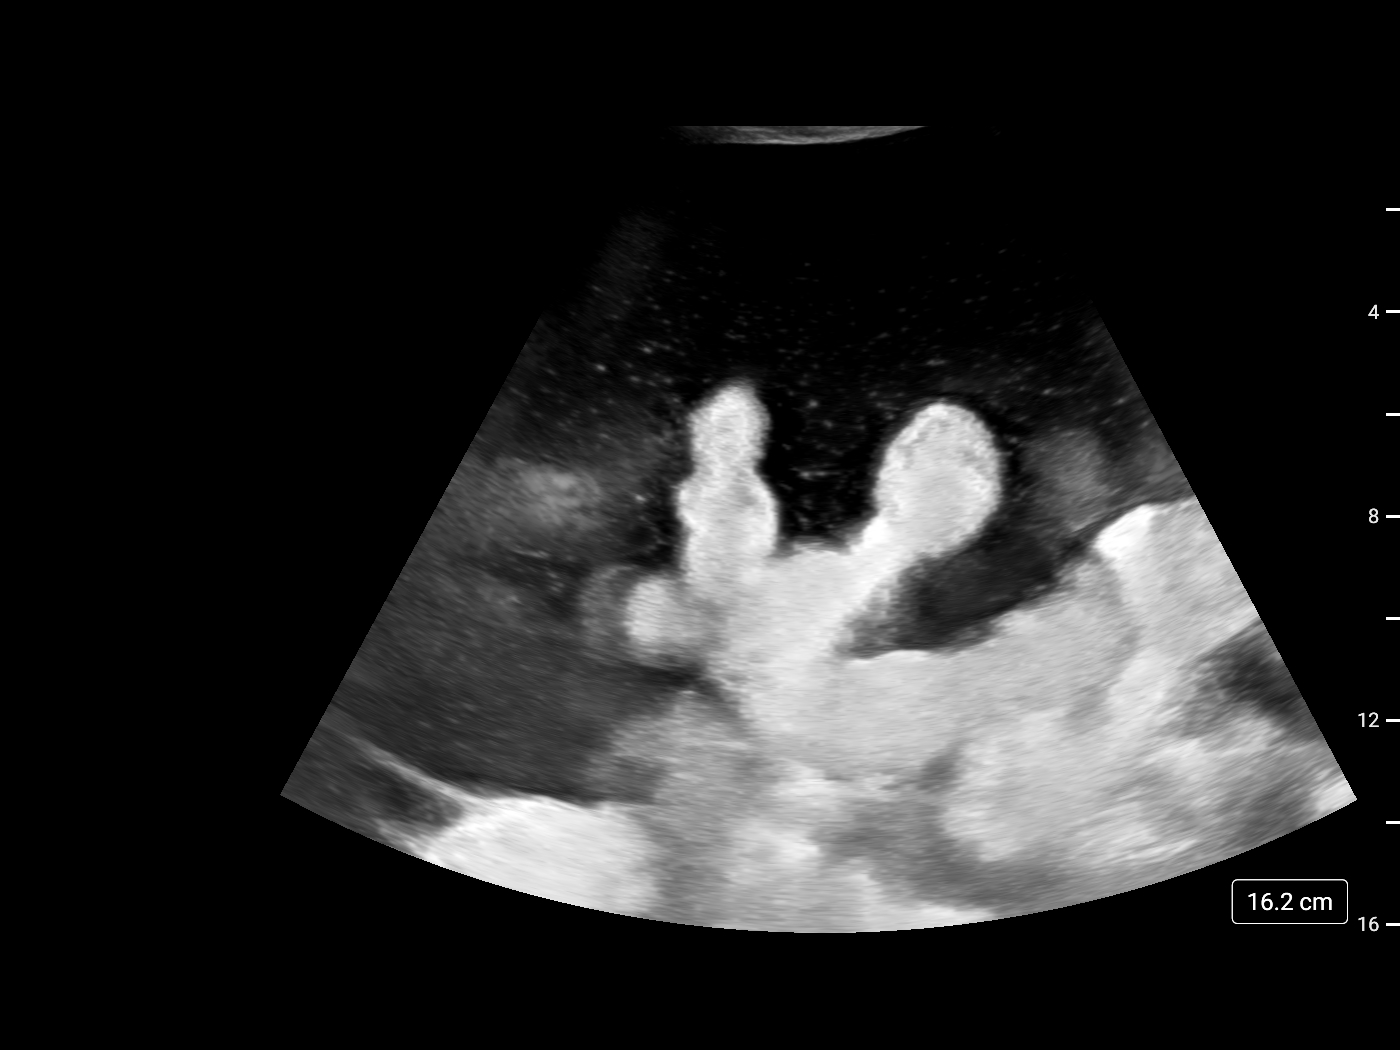
[im 2/2]
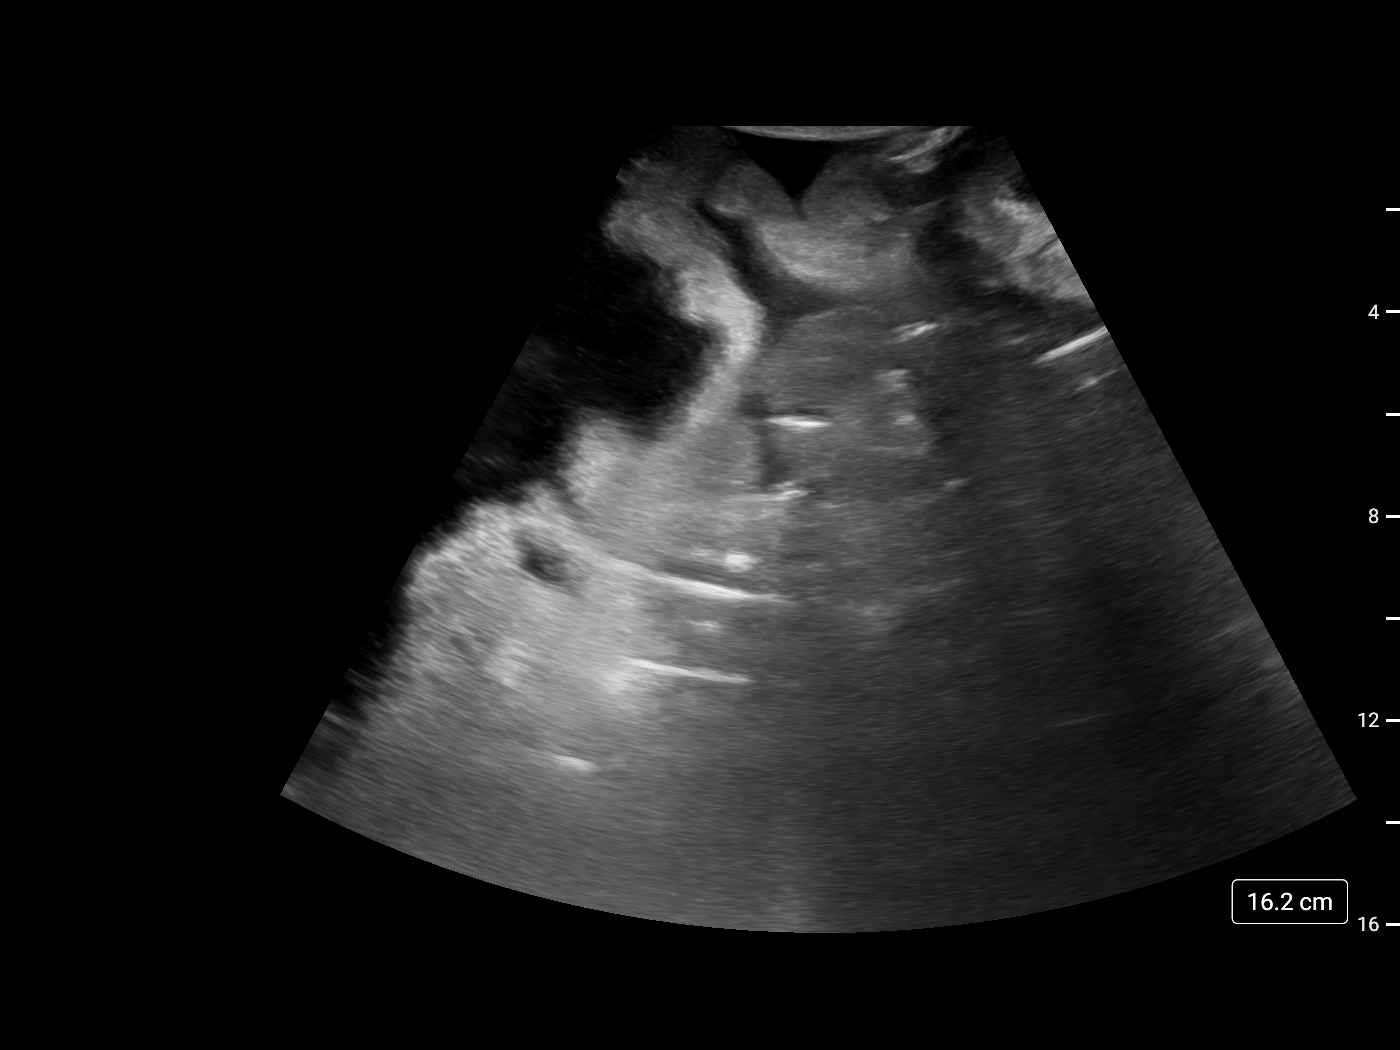

[2 of 2 positions shown; findings below may reference images not displayed]

EXAM:
ULTRASOUND GUIDED THERAPEUTIC PARACENTESIS

MEDICATIONS:
8 mL% lidocaine

COMPLICATIONS:
None immediate.

PROCEDURE:
Informed written consent was obtained from the patient after a
discussion of the risks, benefits and alternatives to treatment. A
timeout was performed prior to the initiation of the procedure.

Initial ultrasound scanning demonstrates a large amount of ascites
within the right lower abdominal quadrant. The right lower abdomen
was prepped and draped in the usual sterile fashion. 1% lidocaine
was used for local anesthesia.

Following this, a 19 gauge, 7-cm, Yueh catheter was introduced. An
ultrasound image was saved for documentation purposes. The
paracentesis was performed. The catheter was removed and a dressing
was applied. The patient tolerated the procedure well without
immediate post procedural complication.
Patient received post-procedure intravenous albumin; see nursing
notes for details.
FINDINGS: A total of approximately 7.0 L of clear yellow fluid was removed.
IMPRESSION: Successful ultrasound-guided paracentesis yielding 7.0 liters of
peritoneal fluid.

Read by PAPANE

## 2019-11-10 MED ORDER — LIDOCAINE HCL 1 % IJ SOLN
INTRAMUSCULAR | Status: AC
  Filled 2019-11-10: qty 20

## 2019-11-10 MED ORDER — ALBUMIN HUMAN 25 % IV SOLN
50.0000 g | Freq: Once | INTRAVENOUS | Status: DC
  Filled 2019-11-10: qty 200

## 2019-11-10 MED ORDER — LIDOCAINE HCL 1 % IJ SOLN
INTRAMUSCULAR | Status: DC | PRN
  Administered 2019-11-10: 10 mL

## 2019-11-10 MED ORDER — ALBUMIN HUMAN 25 % IV SOLN
INTRAVENOUS | Status: AC
  Filled 2019-11-10: qty 200

## 2019-11-10 NOTE — Procedures (Signed)
PROCEDURE SUMMARY:  Successful image-guided paracentesis from the right lower abdomen.  Yielded 7.0 liters of clear yellow fluid which was the requested maximum. No immediate complications.  EBL: zero Patient tolerated well.   Specimen was not sent for labs.  Patient to receive post procedure albumin administration per IR protocol.  Please see imaging section of Epic for full dictation.  Joaquim Nam [name] 11/10/2019 1:09 PM

## 2019-11-21 ENCOUNTER — Other Ambulatory Visit (HOSPITAL_COMMUNITY): Payer: Medicare Other

## 2019-11-22 ENCOUNTER — Other Ambulatory Visit: Payer: Self-pay

## 2019-11-22 ENCOUNTER — Ambulatory Visit (HOSPITAL_COMMUNITY)
Admission: RE | Admit: 2019-11-22 | Discharge: 2019-11-22 | Disposition: A | Discharge status: Home / Self Care | Payer: Medicare Other | Source: Ambulatory Visit | Attending: Gastroenterology | Admitting: Gastroenterology

## 2019-11-22 DIAGNOSIS — R188 Other ascites: Secondary | ICD-10-CM | POA: Diagnosis not present

## 2019-11-22 DIAGNOSIS — K7460 Unspecified cirrhosis of liver: Secondary | ICD-10-CM

## 2019-11-22 HISTORY — PX: IR PARACENTESIS: IMG2679

## 2019-11-22 IMAGING — US IR PARACENTESIS
1 series · 3 of 3 positions shown · non-contrast
Comparison: none

INDICATION: Decompensated cirrhosis with recurrent ascites. Request for
therapeutic paracentesis up to 7 L max.

[Series 1: (id) · 3 of 3 slices shown]
[im 1/3]
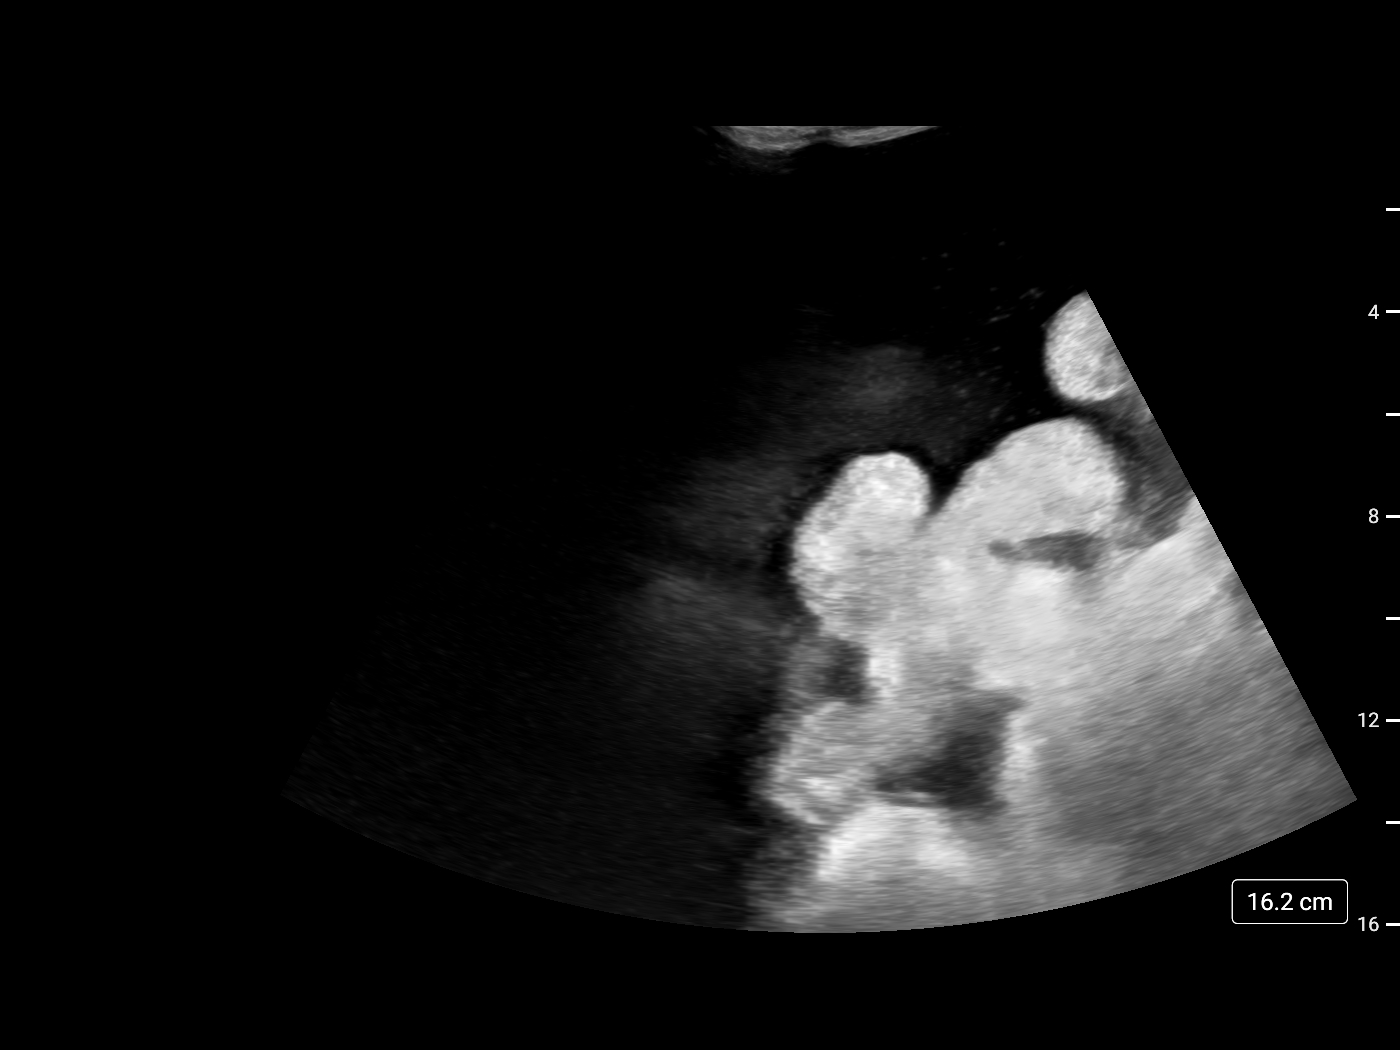
[im 2/3]
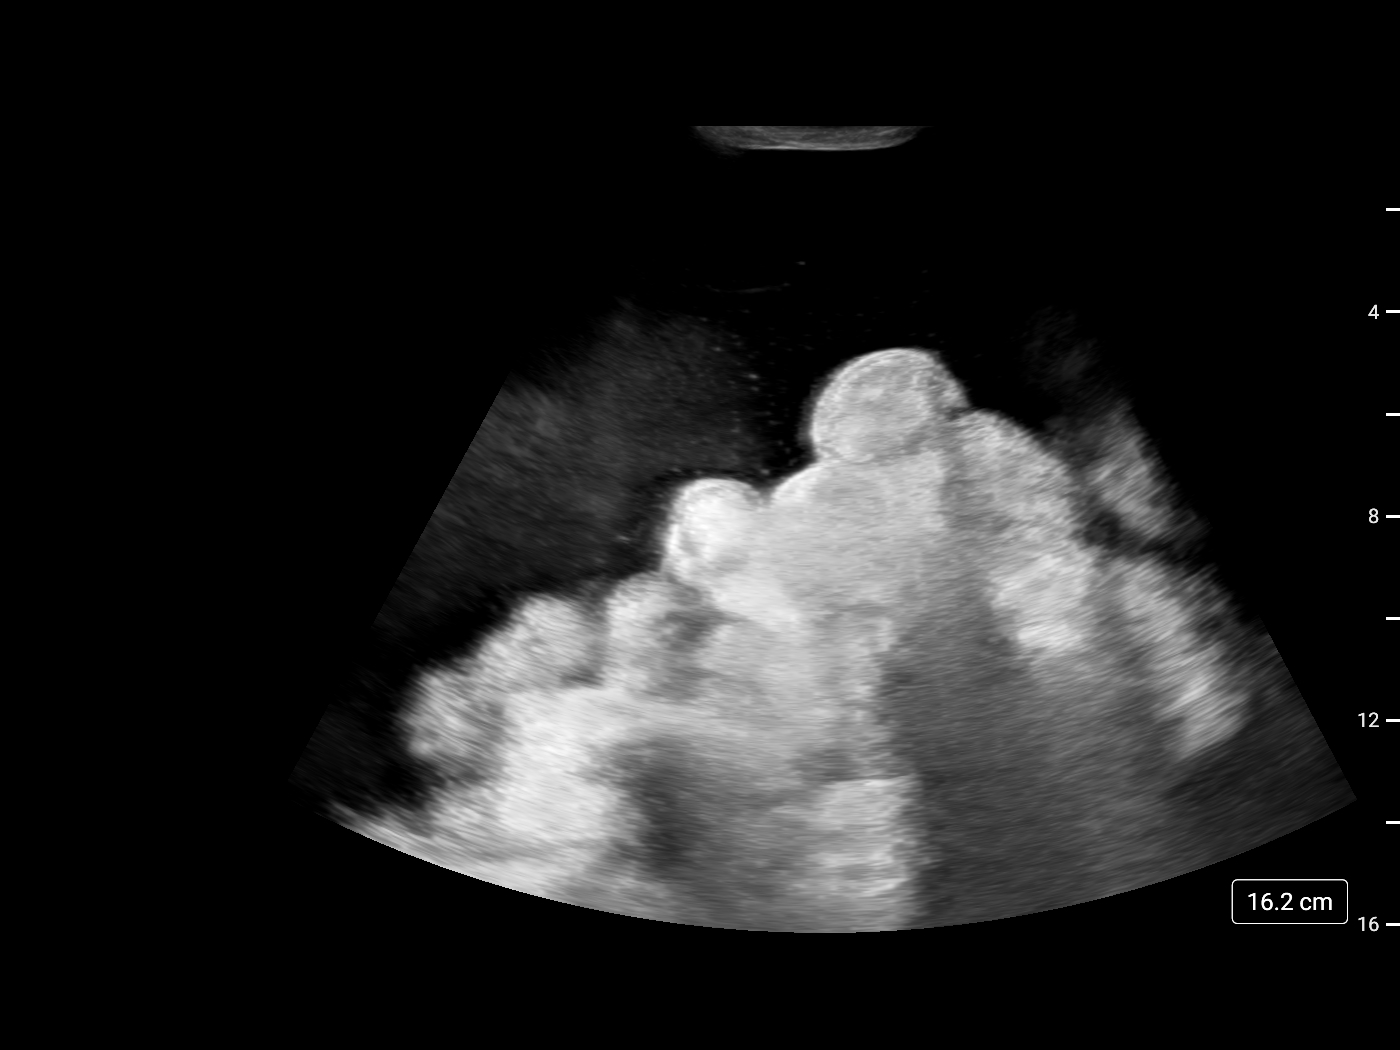
[im 3/3]
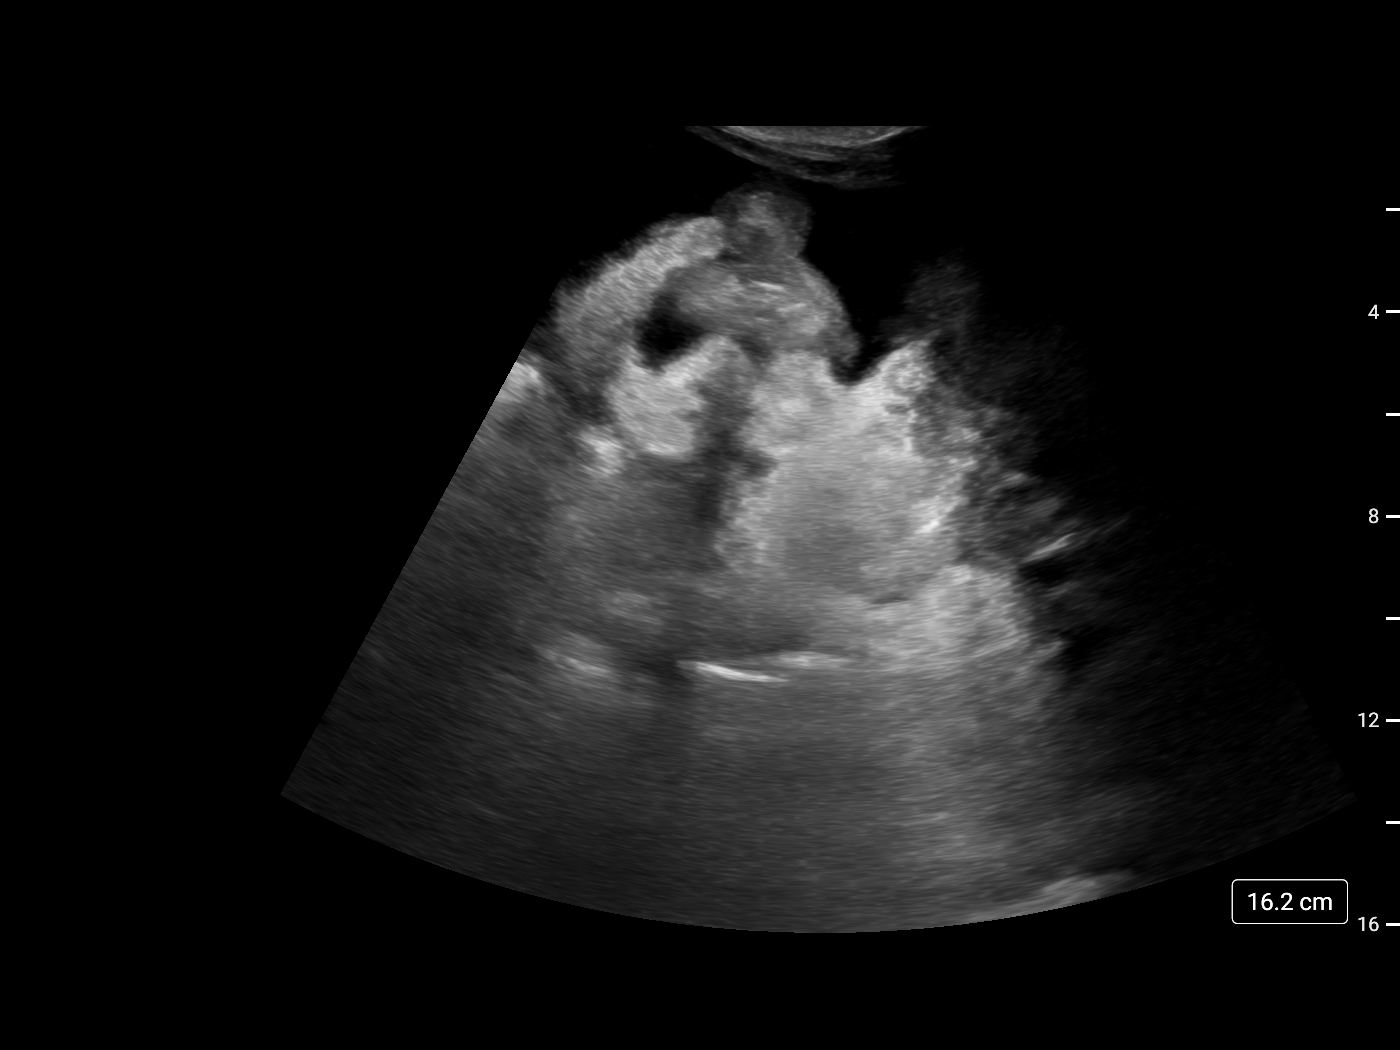

[3 of 3 positions shown; findings below may reference images not displayed]

EXAM:
ULTRASOUND GUIDED RIGHT LOWER QUADRANT PARACENTESIS

MEDICATIONS:
None.

COMPLICATIONS:
None immediate.

PROCEDURE:
Informed written consent was obtained from the patient after a
discussion of the risks, benefits and alternatives to treatment. A
timeout was performed prior to the initiation of the procedure.

Initial ultrasound scanning demonstrates a large amount of ascites
within the right lower abdominal quadrant. The right lower abdomen
was prepped and draped in the usual sterile fashion. 1% lidocaine
was used for local anesthesia.

Following this, a 19 gauge, 7-cm, Yueh catheter was introduced. An
ultrasound image was saved for documentation purposes. The
paracentesis was performed. The catheter was removed and a dressing
was applied. The patient tolerated the procedure well without
immediate post procedural complication.
Patient received post-procedure intravenous albumin; see nursing
notes for details.
FINDINGS: A total of approximately 7 L of clear yellow fluid was removed.
Samples were sent to the laboratory as requested by the clinical
team.
IMPRESSION: Successful ultrasound-guided paracentesis yielding 7 liters of
peritoneal fluid.

## 2019-11-22 MED ORDER — LIDOCAINE HCL 1 % IJ SOLN
INTRAMUSCULAR | Status: AC
  Filled 2019-11-22: qty 20

## 2019-11-22 MED ORDER — ALBUMIN HUMAN 25 % IV SOLN: 50.0000 g | Freq: Once | INTRAVENOUS | Status: AC

## 2019-11-22 MED ORDER — LIDOCAINE HCL (PF) 1 % IJ SOLN
INTRAMUSCULAR | Status: DC | PRN
  Administered 2019-11-22: 10 mL

## 2019-11-22 MED ORDER — ALBUMIN HUMAN 25 % IV SOLN
INTRAVENOUS | Status: AC
  Administered 2019-11-22: 50 g via INTRAVENOUS
  Filled 2019-11-22: qty 200

## 2019-11-22 NOTE — Procedures (Signed)
PROCEDURE SUMMARY:  Successful US guided paracentesis from RLQ.  Yielded 7 L of clear yellow fluid.  No immediate complications.  Pt tolerated well.   Specimen was sent for labs.  EBL < 56mL  Ascencion Dike [name] 11/22/2019 2:03 PM

## 2019-11-25 ENCOUNTER — Other Ambulatory Visit: Payer: Self-pay | Admitting: *Deleted

## 2019-11-25 DIAGNOSIS — K7460 Unspecified cirrhosis of liver: Secondary | ICD-10-CM

## 2019-11-28 ENCOUNTER — Telehealth: Payer: Self-pay

## 2019-11-28 NOTE — Telephone Encounter (Signed)
Phone call to patient and caregiver Y'Phi Mlo to inform them of appointment for Donald Aguilar on February 16,2021 at 8:45 am for paracentesis at [facility].  Mr. [name] will take him.  He is also aware of follow-up appointment with Dr. Loletha Carrow on February 24 at 10:40 am.  Told them that he needs to arrive around 8:00 am to have labwork done prior to seeing doctor.  [name] RN, Congregational Nurse [phone]

## 2019-11-29 ENCOUNTER — Ambulatory Visit: Payer: Medicare Other | Admitting: Family Medicine

## 2019-12-06 ENCOUNTER — Other Ambulatory Visit: Payer: Self-pay

## 2019-12-06 ENCOUNTER — Other Ambulatory Visit: Payer: Self-pay | Admitting: Gastroenterology

## 2019-12-06 ENCOUNTER — Ambulatory Visit (HOSPITAL_COMMUNITY)
Admission: RE | Admit: 2019-12-06 | Discharge: 2019-12-06 | Disposition: A | Discharge status: Home / Self Care | Payer: Medicare Other | Source: Ambulatory Visit | Attending: Gastroenterology | Admitting: Gastroenterology

## 2019-12-06 DIAGNOSIS — K7460 Unspecified cirrhosis of liver: Secondary | ICD-10-CM

## 2019-12-06 DIAGNOSIS — R188 Other ascites: Secondary | ICD-10-CM | POA: Diagnosis not present

## 2019-12-06 HISTORY — PX: IR PARACENTESIS: IMG2679

## 2019-12-06 IMAGING — US IR PARACENTESIS
2 series · 2 of 2 positions shown · non-contrast
Comparison: none

INDICATION: Patient with history of decompensated cirrhosis, recurrent ascites
requiring frequent large volume paracentesis. Request to IR for
therapeutic paracentesis with 7 L maximum and post procedure albumin
administration per IR protocol.

[Series 1: (id) · 1 of 1 slices shown (1 of 2)]
[im 1/1]
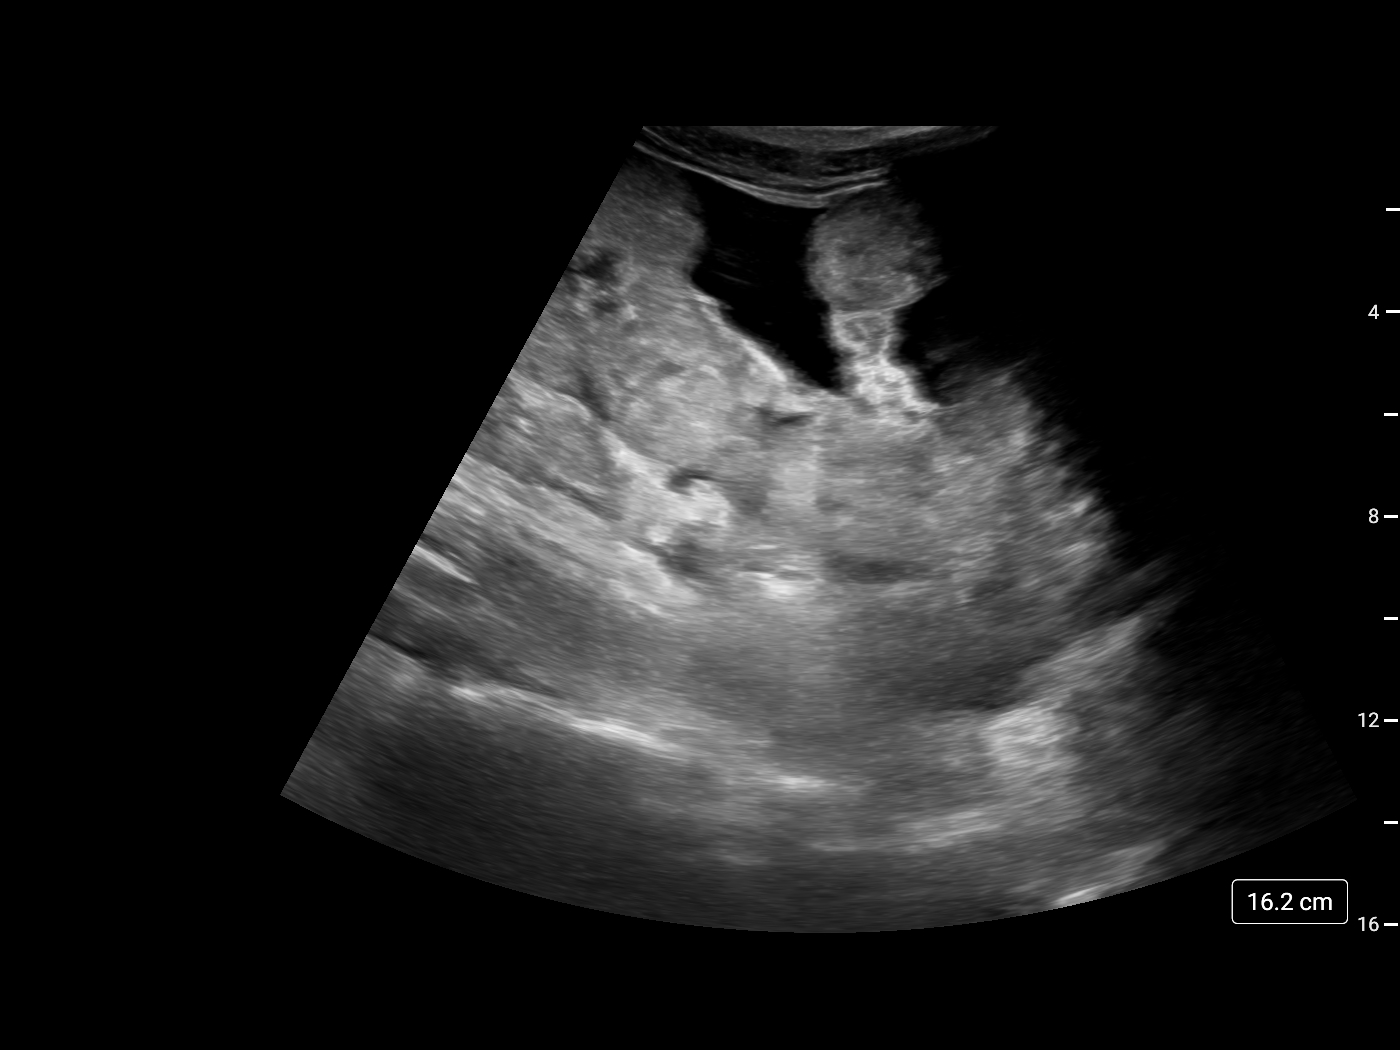

[Series 1: (id) · 1 of 1 slices shown (2 of 2)]
[im 1/1]
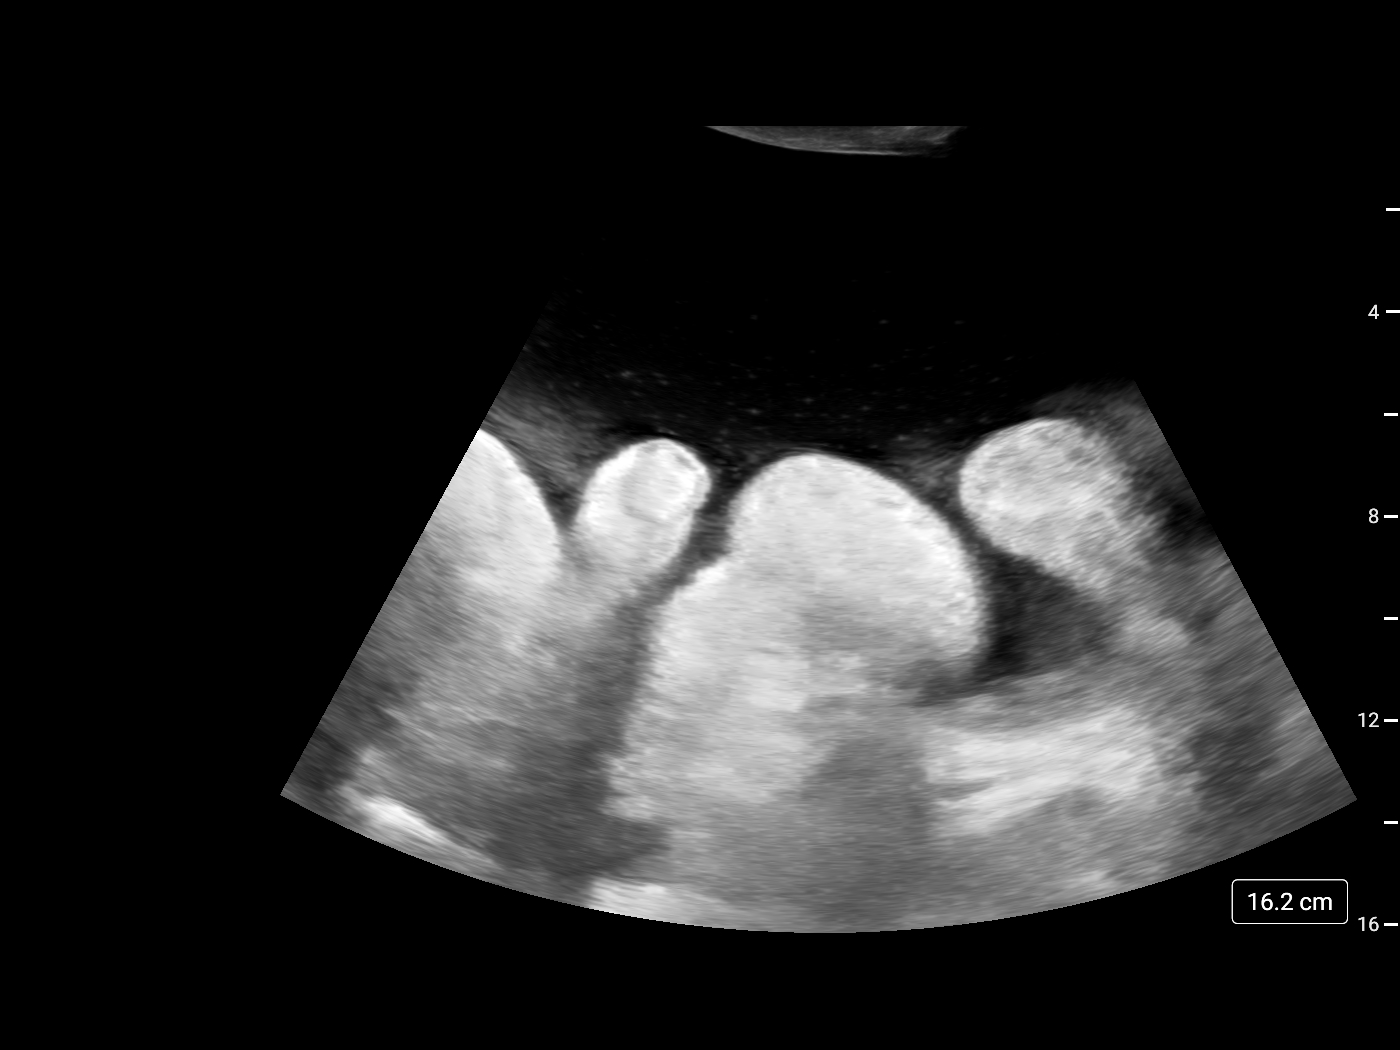

[2 of 2 positions shown; findings below may reference images not displayed]

EXAM:
ULTRASOUND GUIDED THERAPEUTIC PARACENTESIS

MEDICATIONS:
10 mL 1% lidocaine

COMPLICATIONS:
None immediate.

PROCEDURE:
Informed written consent was obtained from the patient after a
discussion of the risks, benefits and alternatives to treatment. A
timeout was performed prior to the initiation of the procedure.

Initial ultrasound scanning demonstrates a large amount of ascites
within the left lower abdominal quadrant. The left lower abdomen was
prepped and draped in the usual sterile fashion. 1% lidocaine was
used for local anesthesia.

Following this, a 19 gauge, 7-cm, Yueh catheter was introduced. An
ultrasound image was saved for documentation purposes. The
paracentesis was performed. The catheter was removed and a dressing
was applied. The patient tolerated the procedure well without
immediate post procedural complication.
Patient received post-procedure intravenous albumin; see nursing
notes for details.
FINDINGS: A total of approximately 7.0 L of clear yellow fluid was removed.
IMPRESSION: Successful ultrasound-guided paracentesis yielding 7.0 liters of
peritoneal fluid.

Read by MARIE PAULINE

## 2019-12-06 MED ORDER — ALBUMIN HUMAN 25 % IV SOLN
INTRAVENOUS | Status: AC
  Filled 2019-12-06: qty 200

## 2019-12-06 MED ORDER — LIDOCAINE HCL 1 % IJ SOLN
INTRAMUSCULAR | Status: AC
  Filled 2019-12-06: qty 20

## 2019-12-06 MED ORDER — LIDOCAINE HCL 1 % IJ SOLN
INTRAMUSCULAR | Status: DC | PRN
  Administered 2019-12-06: 10 mL

## 2019-12-06 NOTE — Procedures (Signed)
PROCEDURE SUMMARY:  Successful image-guided paracentesis from the left lower abdomen.  Yielded 7.0 liters of clear yellow fluid.  No immediate complications.  EBL: zero Patient tolerated well.   Specimen was not sent for labs. Patient to receive post procedure albumin per IR protocol.   Please see imaging section of Epic for full dictation.  Joaquim Nam [name] 12/06/2019 10:02 AM

## 2019-12-14 ENCOUNTER — Other Ambulatory Visit: Payer: Self-pay | Admitting: Gastroenterology

## 2019-12-14 ENCOUNTER — Other Ambulatory Visit (INDEPENDENT_AMBULATORY_CARE_PROVIDER_SITE_OTHER): Payer: Medicare Other

## 2019-12-14 ENCOUNTER — Telehealth: Payer: Self-pay

## 2019-12-14 ENCOUNTER — Encounter: Payer: Self-pay | Admitting: Gastroenterology

## 2019-12-14 ENCOUNTER — Other Ambulatory Visit (HOSPITAL_COMMUNITY): Payer: Self-pay | Admitting: Gastroenterology

## 2019-12-14 ENCOUNTER — Ambulatory Visit (INDEPENDENT_AMBULATORY_CARE_PROVIDER_SITE_OTHER): Payer: Medicare Other | Admitting: Gastroenterology

## 2019-12-14 ENCOUNTER — Other Ambulatory Visit: Payer: Self-pay

## 2019-12-14 VITALS — BP 96/60 | HR 100 | Temp 97.9°F | Ht 64.5 in | Wt 120.4 lb

## 2019-12-14 DIAGNOSIS — I9589 Other hypotension: Secondary | ICD-10-CM

## 2019-12-14 DIAGNOSIS — K7031 Alcoholic cirrhosis of liver with ascites: Secondary | ICD-10-CM

## 2019-12-14 DIAGNOSIS — N5089 Other specified disorders of the male genital organs: Secondary | ICD-10-CM

## 2019-12-14 DIAGNOSIS — B182 Chronic viral hepatitis C: Secondary | ICD-10-CM

## 2019-12-14 LAB — BASIC METABOLIC PANEL
BUN: 11 mg/dL (ref 6–23)
CO2: 29 mEq/L (ref 19–32)
Calcium: 9.1 mg/dL (ref 8.4–10.5)
Chloride: 97 mEq/L (ref 96–112)
Creatinine, Ser: 0.77 mg/dL (ref 0.40–1.50)
GFR: 100.76 mL/min (ref >60.00)
Glucose, Bld: 93 mg/dL (ref 70–99)
Potassium: 3.1 mEq/L — ABNORMAL LOW (ref 3.5–5.1)
Sodium: 133 mEq/L — ABNORMAL LOW (ref 135–145)

## 2019-12-14 MED ORDER — MIDODRINE HCL 5 MG PO TABS: 5.0000 mg | ORAL_TABLET | Freq: Three times a day (TID) | ORAL | 1 refills | Status: DC

## 2019-12-14 NOTE — Patient Instructions (Signed)
If you are age 67 or older, your body mass index should be between 23-30. Your Body mass index is 20.34 kg/m. If this is out of the aforementioned range listed, please consider follow up with your Primary Care Provider.  If you are age 67 or younger, your body mass index should be between 19-25. Your Body mass index is 20.34 kg/m. If this is out of the aformentioned range listed, please consider follow up with your Primary Care Provider.   Your provider has requested that you go to the basement level for lab work before leaving today. Press "B" on the elevator. The lab is located at the first door on the left as you exit the elevator.   You have been scheduled for an abdominal paracentesis at [facility] radiology (1st floor of hospital) on 12-16-2019 at 10am. Please arrive at least 15 minutes prior to your appointment time for registration. Should you need to reschedule this appointment for any reason, please call our office at [phone].  #1. 12-16-2019 #2. 12-23-2019 #3. 12-30-2019  #4 01-06-2020  AT 10 am   We will send a referral to Infectious disease for the Hep C.   Please start the new medication MIDODINE 5 mg tablets, 1 tablet 3 times a day with meals.  Follow a low sodium diet, see information given to you today.  It was a pleasure to see you today!  Dr. Loletha Carrow

## 2019-12-14 NOTE — Addendum Note (Signed)
Addended by: Elias Else on: 12/14/2019 01:59 PM   Modules accepted: Orders

## 2019-12-14 NOTE — Telephone Encounter (Signed)
Referral to Infectious disease for Hep C. Will await appointment info.

## 2019-12-14 NOTE — Addendum Note (Signed)
Addended by: Elias Else on: 12/14/2019 01:56 PM   Modules accepted: Orders

## 2019-12-14 NOTE — Progress Notes (Signed)
Donald Aguilar GI Progress Note  Chief Complaint: End-stage liver disease with large volume ascites  Subjective  History:  This patient was seen with a [name] interpreter in person. This is follow-up for end-stage cirrhosis from a combination of alcohol abuse and chronic hepatitis C infection, present to the hospital a few months back with decompensated disease and large volume ascites.  He has had multiple paracentesis since then, continues to have a large amount of ascites causing abdominal discomfort and dyspnea.  It is reportedly improved since around the first of the year when we started more regular paracentesis. Is also complaining of scrotal fullness and discomfort. It is not clear he has understood the concept of sodium restriction.  He has attended all of his paracentesis appointments as scheduled. Today he has abdominal bloating and discomfort from the pressure of ascites, and was hoping perhaps the paracentesis could be performed more frequently. He also had a new primary care appointment on January 12.  ROS: Cardiovascular:  no chest pain Respiratory: Intermittent dyspnea when the degree of ascites is increased, especially laying flat. Appetite sounds chronically decreased. Remainder systems negative is near as can be determined. The patient's Past Medical, Family and Social History were reviewed and are on file in the EMR. He no longer drinks alcohol, but continues to smoke daily.  Objective:  Med list reviewed  Current Outpatient Medications:  .  albumin human 5 % bottle, Inject 250 mLs (12.5 g total) into the vein as needed. For paracentesis greater than 4L, Disp: 250 mL, Rfl:  .  furosemide (LASIX) 40 MG tablet, Take 1 tablet (40 mg total) by mouth daily., Disp: 30 tablet, Rfl: 3 .  spironolactone (ALDACTONE) 50 MG tablet, Take 1 tablet (50 mg total) by mouth daily. (Patient taking differently: Take 100 mg by mouth daily. Take 2 tablets once a day), Disp: 30  tablet, Rfl: 3 .  midodrine (PROAMATINE) 5 MG tablet, Take 1 tablet (5 mg total) by mouth 3 (three) times daily with meals., Disp: 90 tablet, Rfl: 1 .  pantoprazole (PROTONIX) 40 MG tablet, TAKE 1  BY MOUTH TWICE DAILY (Patient not taking: Reported on 12/14/2019), Disp: 60 tablet, Rfl: 1   Vital signs in last 24 hrs: Vitals:   12/14/19 1036  BP: 96/60  Pulse: 100  Temp: 97.9 F (36.6 C)    Physical Exam  Cachectic man, gets on exam table slowly but without assistance.  His interpreter says he is oriented to person date and location.  HEENT: sclera anicteric, oral mucosa moist without lesions  Neck: supple, no thyromegaly, JVD or lymphadenopathy  Cardiac: RRR without murmurs, S1S2 heard, no peripheral edema  Pulm: End expiratory wheezing bilaterally, normal RR and effort noted  Abdomen: soft, distended with ascites (not tense), no tenderness, with active bowel sounds. No guarding or palpable hepatosplenomegaly.  Skin; warm and dry, no jaundice.  Has some darker pigmentation on the right hemiabdomen Marked scrotal edema Labs:  BMP Latest Ref Rng & Units 11/08/2019 11/02/2019 10/12/2019  Glucose 70 - 99 mg/dL 101(H) 107(H) 114(H)  BUN 6 - 23 mg/dL 13 6(L) 9  Creatinine 0.40 - 1.50 mg/dL 0.85 0.69 0.81  Sodium 135 - 145 mEq/L 133(L) 133(L) 137  Potassium 3.5 - 5.1 mEq/L 3.4(L) 3.0(L) 3.2(L)  Chloride 96 - 112 mEq/L 96 95(L) 100  CO2 19 - 32 mEq/L 31 28 27   Calcium 8.4 - 10.5 mg/dL 8.6 8.0(L) 8.2(L)    ___________________________________________ Radiologic studies:  Paracentesis February 16 -7 L  February 2 -7 L January 21 -7 L January 13 -7 L December 30 -5 L  Hepatitis C viral load 5.3 log, no genotype done Negative hepatitis B surface antigen, positive hepatitis B core antibody, negative HIV antibody ____________________________________________ Other:   _____________________________________________ @ASSESSMENTPLANBEGIN @ Assessment: Encounter Diagnoses  Name  Primary?  . Alcoholic cirrhosis of liver with ascites ([place]) Yes  . Chronic hepatitis C without hepatic coma ([place])   . Scrotal edema   . Other specified hypotension    This patient has end-stage liver disease with large volume ascites, treatment of which has been limited by relative hypotension and therefore difficulty increasing diuretic doses.  It is also not clear he properly restrict sodium intake.  There is also been some social challenge with language barrier and lack of patient resources.  We will try to manage his volume overload as best we can. He also needs evaluation by infectious disease for the chronic hepatitis C, though I am not certain if he is a treatment candidate at present given his decompensated cirrhosis. Meld score of 17 with most recent labs technically still makes him a potential candidate for TIPS, but I have great concern about with the immediate and potential long-term risks of that given the challenges noted above.  We need to optimize diuretic therapy as best we can before considering that intervention.   Plan: I have prescribed midodrine 5 mg 3 times daily BMP and hepatitis C virus genotype today Therapeutic paracentesis has been set up weekly for the next 4 weeks.  Maximum 7 L fluid removal, 50 g of 25% IV albumin given with each procedure. Infectious disease referral sent He will need close follow-up with primary care to check on his blood pressure. 2 g daily sodium restriction Continue current doses of spironolactone and furosemide.  I cannot increase the dose right now due to his hypotension.  Perhaps, if blood pressure doing better in the interim and at follow-up with me, we might be able to cautiously increase the diuretic doses. Medical supply store for scrotal support device  Clinic follow-up with me in about 6 weeks.  45 minutes were spent on this encounter (including chart review, history/exam, counseling/coordination of care, and  documentation)  Nelida Meuse III

## 2019-12-15 ENCOUNTER — Other Ambulatory Visit: Payer: Self-pay | Admitting: *Deleted

## 2019-12-15 ENCOUNTER — Telehealth: Payer: Self-pay | Admitting: Gastroenterology

## 2019-12-15 MED ORDER — POTASSIUM CHLORIDE CRYS ER 20 MEQ PO TBCR: 40.0000 meq | EXTENDED_RELEASE_TABLET | Freq: Every day | ORAL | 1 refills | Status: DC

## 2019-12-15 NOTE — Telephone Encounter (Signed)
Yes, he should take both.    I am aware that spironolactone can raise the potassium level.  His potassium consistently runs low when checked (including with labs this week).

## 2019-12-15 NOTE — Telephone Encounter (Signed)
Pharmacy called wanting to know if it's ok that this patient takes potassium chloride SA and spironolactone at the same time please advise.

## 2019-12-16 ENCOUNTER — Ambulatory Visit (HOSPITAL_COMMUNITY): Payer: Medicare Other

## 2019-12-16 ENCOUNTER — Telehealth: Payer: Self-pay

## 2019-12-16 ENCOUNTER — Ambulatory Visit (HOSPITAL_COMMUNITY)
Admission: RE | Admit: 2019-12-16 | Discharge: 2019-12-16 | Disposition: A | Discharge status: Home / Self Care | Payer: Medicare Other | Source: Ambulatory Visit | Attending: Gastroenterology | Admitting: Gastroenterology

## 2019-12-16 ENCOUNTER — Other Ambulatory Visit: Payer: Self-pay

## 2019-12-16 DIAGNOSIS — K7031 Alcoholic cirrhosis of liver with ascites: Secondary | ICD-10-CM | POA: Diagnosis present

## 2019-12-16 DIAGNOSIS — N5089 Other specified disorders of the male genital organs: Secondary | ICD-10-CM | POA: Diagnosis not present

## 2019-12-16 DIAGNOSIS — I9589 Other hypotension: Secondary | ICD-10-CM | POA: Insufficient documentation

## 2019-12-16 DIAGNOSIS — B182 Chronic viral hepatitis C: Secondary | ICD-10-CM | POA: Insufficient documentation

## 2019-12-16 HISTORY — PX: IR PARACENTESIS: IMG2679

## 2019-12-16 IMAGING — US IR PARACENTESIS
1 series · 3 of 3 positions shown · non-contrast
Comparison: none

INDICATION: Patient with history of cirrhosis, hepatitis C, recurrent ascites;
request received for therapeutic paracentesis up to 7 liters.

[Series 1: ir (id) (id)/(id)/(id) ir · 3 of 3 slices shown]
[im 1/3]
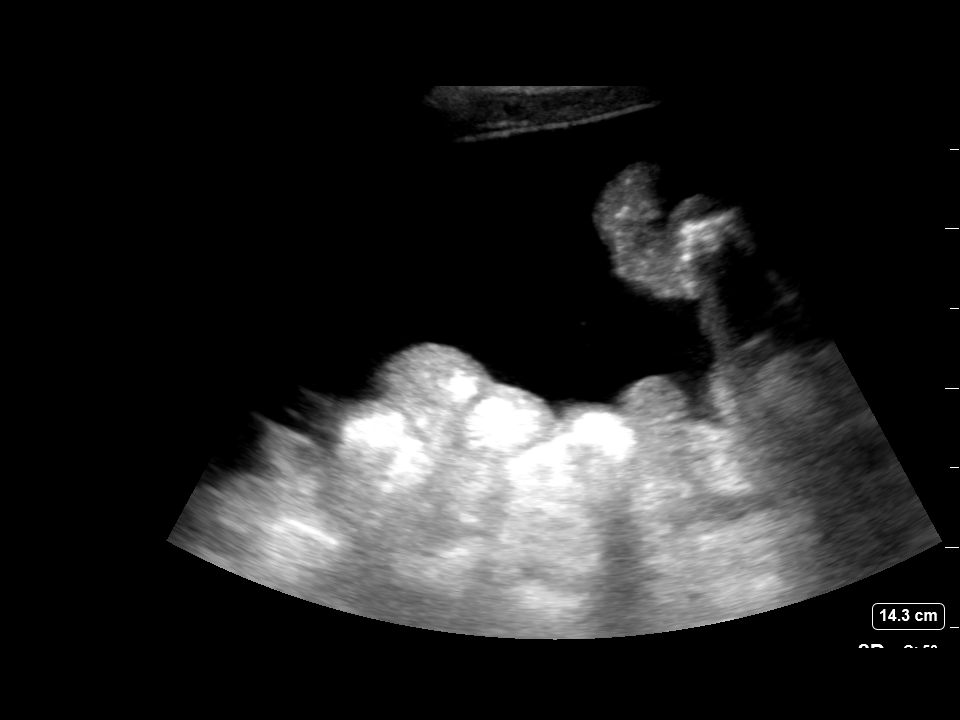
[im 2/3]
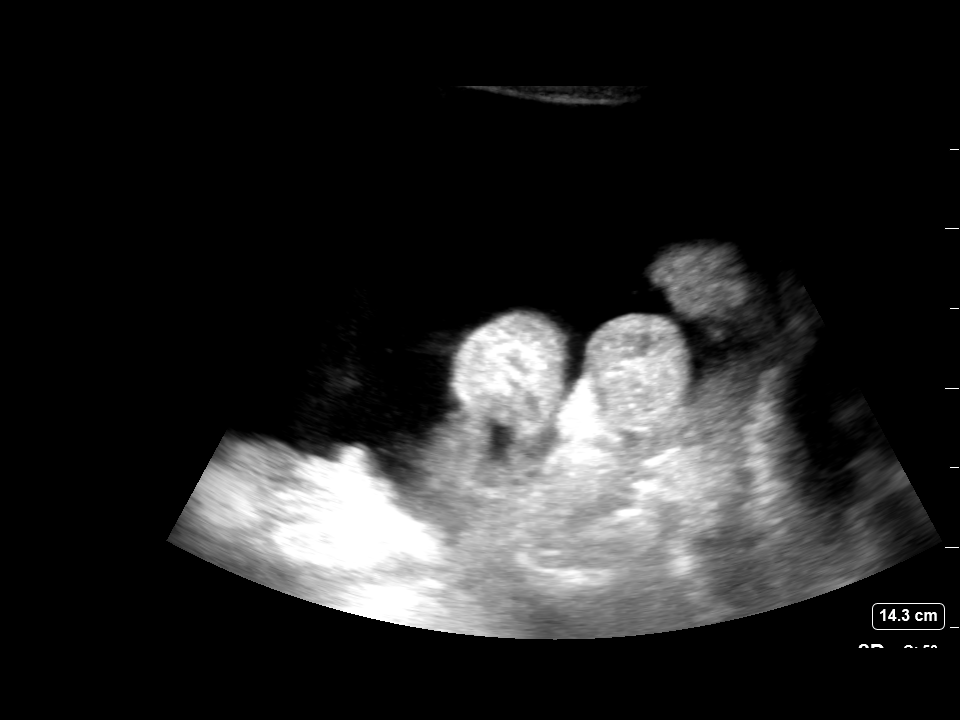
[im 3/3]
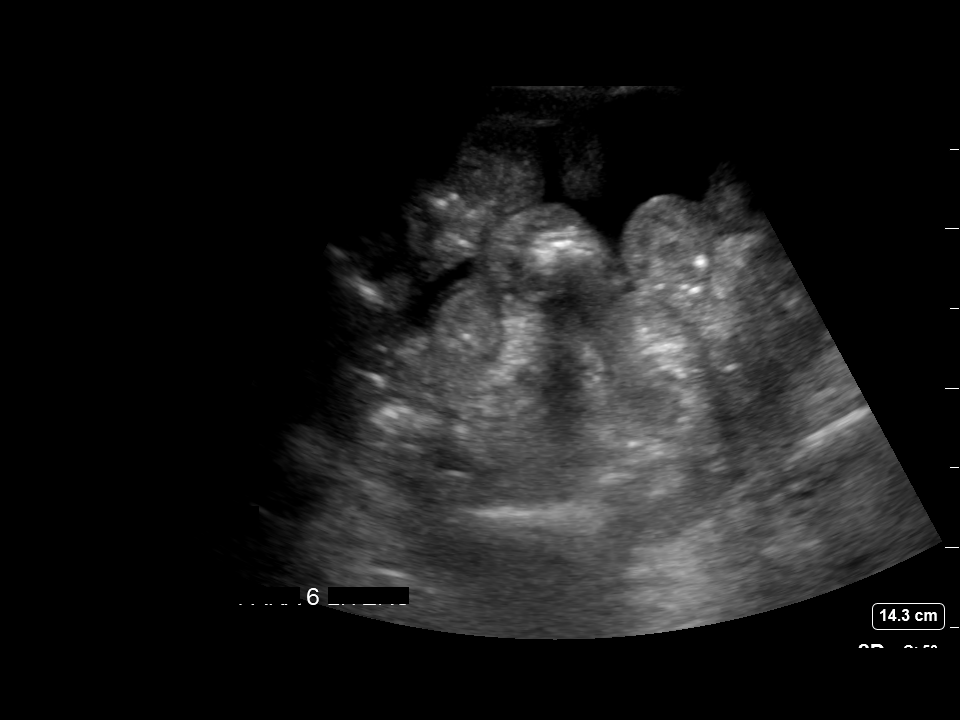

[3 of 3 positions shown; findings below may reference images not displayed]

EXAM:
ULTRASOUND GUIDED THERAPEUTIC  PARACENTESIS

MEDICATIONS:
None

COMPLICATIONS:
None immediate.

PROCEDURE:
Informed written consent was obtained from the patient after a
discussion of the risks, benefits and alternatives to treatment. A
timeout was performed prior to the initiation of the procedure.

Initial ultrasound scanning demonstrates a large amount of ascites
within the left lower abdominal quadrant. The left lower abdomen was
prepped and draped in the usual sterile fashion. 1% lidocaine was
used for local anesthesia.

Following this, a 19 gauge, 7-cm, Yueh catheter was introduced. An
ultrasound image was saved for documentation purposes. The
paracentesis was performed. The catheter was removed and a dressing
was applied. The patient tolerated the procedure well without
immediate post procedural complication.
Patient received post-procedure intravenous albumin; see nursing
notes for details.
FINDINGS: A total of approximately 6 liters of yellow fluid was removed.
IMPRESSION: Successful ultrasound-guided therapeutic paracentesis yielding 6
liters of peritoneal fluid.

Read by ELFIDO

## 2019-12-16 MED ORDER — ALBUMIN HUMAN 25 % IV SOLN
INTRAVENOUS | Status: AC
  Filled 2019-12-16: qty 200

## 2019-12-16 MED ORDER — ALBUMIN HUMAN 25 % IV SOLN
50.0000 g | Freq: Once | INTRAVENOUS | Status: AC
  Administered 2019-12-16: 11:00:00 50 g via INTRAVENOUS
  Filled 2019-12-16: qty 200

## 2019-12-16 MED ORDER — LIDOCAINE HCL 1 % IJ SOLN
INTRAMUSCULAR | Status: AC
  Filled 2019-12-16: qty 20

## 2019-12-16 MED ORDER — LIDOCAINE HCL (PF) 1 % IJ SOLN
INTRAMUSCULAR | Status: DC | PRN
  Administered 2019-12-16: 10 mL

## 2019-12-16 NOTE — Telephone Encounter (Signed)
Called and notified [place] of this information. Spoke to [place] on 12-16-2019

## 2019-12-16 NOTE — Procedures (Signed)
Ultrasound-guided therapeutic paracentesis performed yielding 6 liters of yellow fluid. No immediate complications. EBL none. Pt will receive IV albumin postprocedure.

## 2019-12-16 NOTE — Telephone Encounter (Signed)
Scheduled follow-up PCP appointment with [place] per request of Dr. Loletha Carrow [facility]) on March 15,2021 at 8:50 am.  [name] RN, Congregational Nurse [phone]

## 2019-12-16 NOTE — Telephone Encounter (Signed)
Phone call to patient's friend and caregiver Y'Phi Mlo.  Informed him that Dr. Loletha Carrow' office called to say there is a new prescription for KCL at [facility].  Patient is to take 2 tablets once a day and continue all other medicines as ordered .He stated he will pick up the new medicine tomorrow.  Mr. Kipps has also been referred to his PCP to follow-up on hypotension and low BMI.  [name] RN, Congregational Nurse [phone]

## 2019-12-21 NOTE — Telephone Encounter (Signed)
Scheduled for 01-04-2020 with ID

## 2019-12-23 ENCOUNTER — Ambulatory Visit (HOSPITAL_COMMUNITY)
Admission: RE | Admit: 2019-12-23 | Discharge: 2019-12-23 | Disposition: A | Discharge status: Home / Self Care | Payer: Medicare Other | Source: Ambulatory Visit | Attending: Gastroenterology | Admitting: Gastroenterology

## 2019-12-23 ENCOUNTER — Other Ambulatory Visit: Payer: Self-pay

## 2019-12-23 DIAGNOSIS — N5089 Other specified disorders of the male genital organs: Secondary | ICD-10-CM | POA: Diagnosis not present

## 2019-12-23 DIAGNOSIS — I9589 Other hypotension: Secondary | ICD-10-CM

## 2019-12-23 DIAGNOSIS — K7031 Alcoholic cirrhosis of liver with ascites: Secondary | ICD-10-CM | POA: Insufficient documentation

## 2019-12-23 DIAGNOSIS — B182 Chronic viral hepatitis C: Secondary | ICD-10-CM

## 2019-12-23 HISTORY — PX: IR PARACENTESIS: IMG2679

## 2019-12-23 IMAGING — US IR PARACENTESIS
1 series · 4 of 4 positions shown · non-contrast
Comparison: none

INDICATION: Patient with history of cirrhosis hepatitis C with recurrent ascites
presents for therapeutic paracentesis

[Series 1: ir (id) (id)/(id)/(id) ir · 4 of 4 slices shown]
[im 1/4]
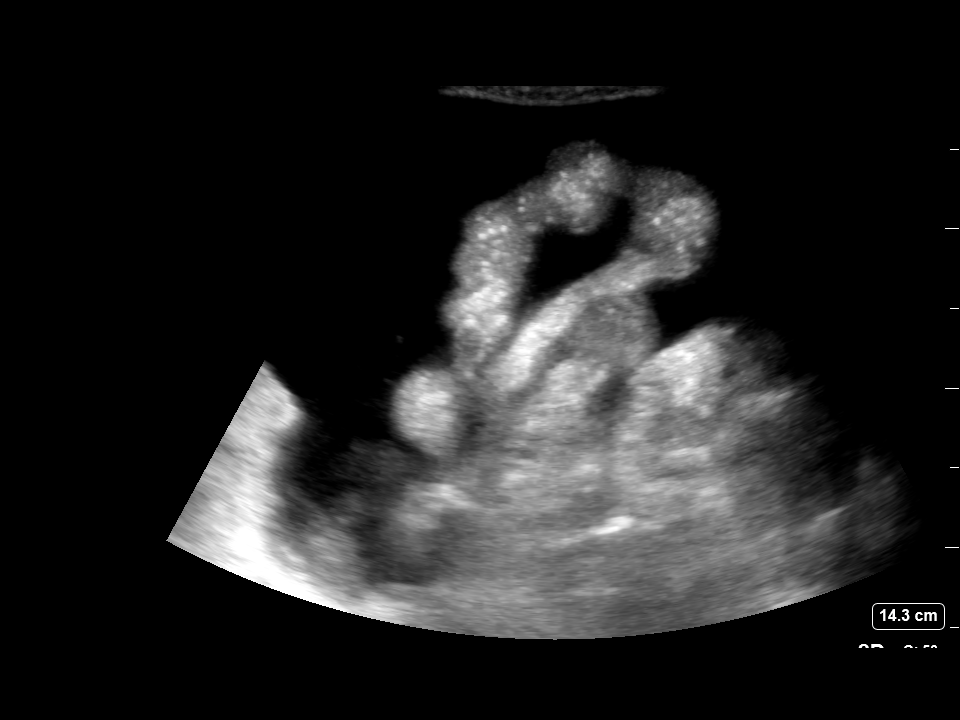
[im 2/4]
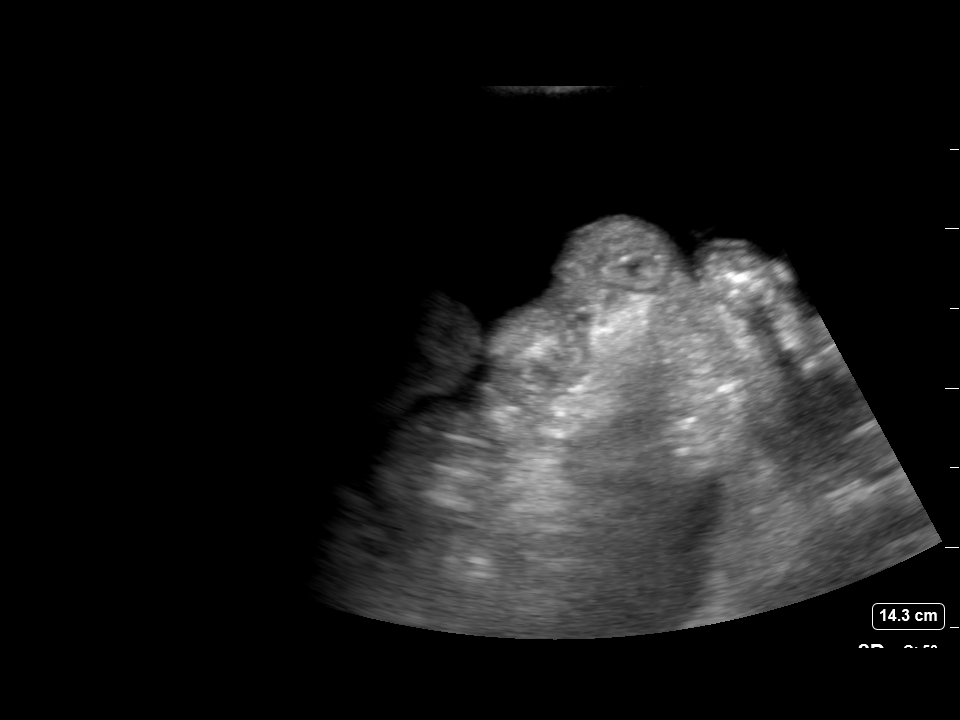
[im 3/4]
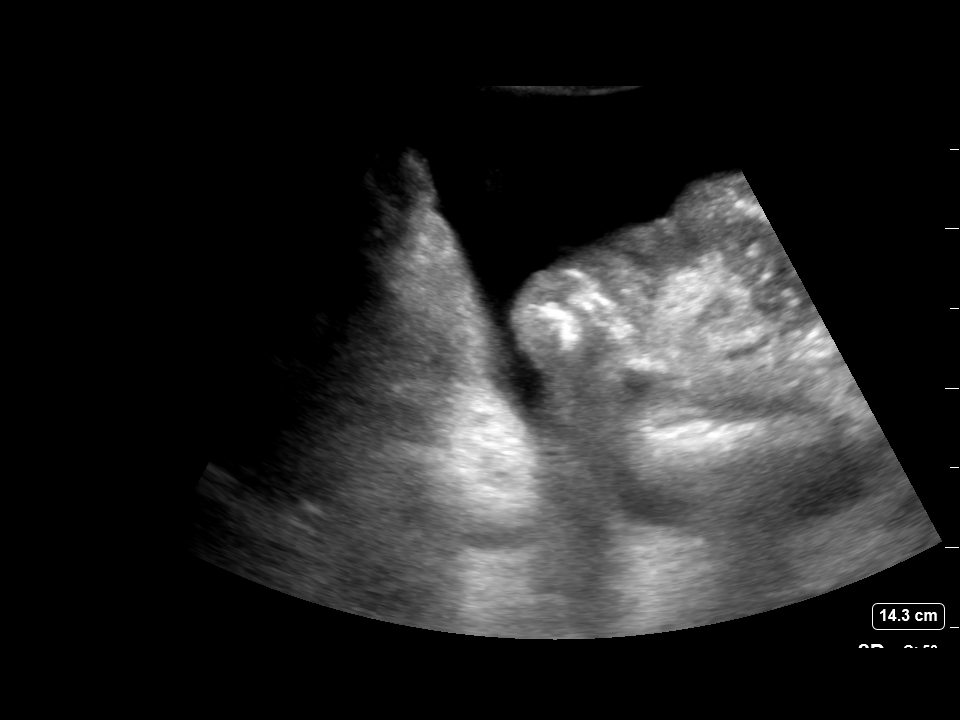
[im 4/4]
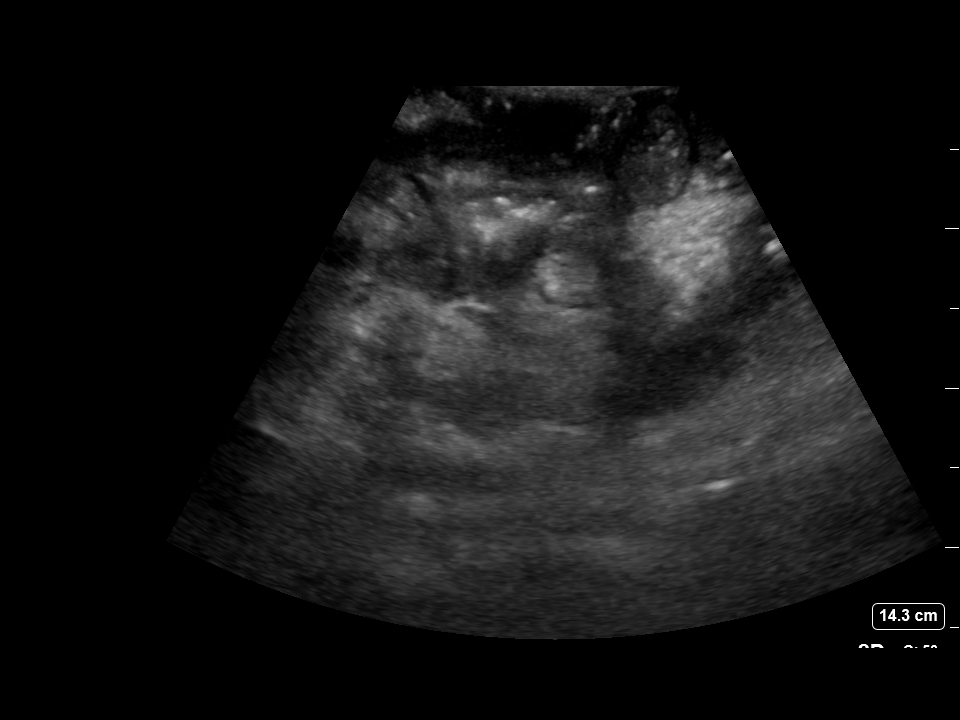

[4 of 4 positions shown; findings below may reference images not displayed]

EXAM:
ULTRASOUND GUIDED THERAPEUTIC PARACENTESIS

MEDICATIONS:
Lidocaine 1% 10 mL

COMPLICATIONS:
None immediate.

PROCEDURE:
Informed written consent was obtained from the patient after a
discussion of the risks, benefits and alternatives to treatment. A
timeout was performed prior to the initiation of the procedure.

Initial ultrasound scanning demonstrates a large amount of ascites
within the right lower abdominal quadrant. The right lower abdomen
was prepped and draped in the usual sterile fashion. 1% lidocaine
was used for local anesthesia.

Following this, a 6 Fr Safe-T-Centesis catheter was introduced. An
ultrasound image was saved for documentation purposes. The
paracentesis was performed. The catheter was removed and a dressing
was applied. The patient tolerated the procedure well without
immediate post procedural complication.
FINDINGS: A total of approximately 5.1 L of straw-colored fluid was removed.
IMPRESSION: Successful ultrasound-guided therapeutic paracentesis yielding
liters of peritoneal fluid.

Read by ORLANDIITO NP

## 2019-12-23 MED ORDER — LIDOCAINE HCL 1 % IJ SOLN
INTRAMUSCULAR | Status: AC
  Filled 2019-12-23: qty 20

## 2019-12-23 NOTE — Procedures (Signed)
Ultrasound-guidedtherapeutic paracentesis performed yielding 5.1 liters of straw colored fluid.. No immediate complications. EBL is none.  

## 2019-12-30 ENCOUNTER — Other Ambulatory Visit: Payer: Self-pay

## 2019-12-30 ENCOUNTER — Ambulatory Visit (HOSPITAL_COMMUNITY)
Admission: RE | Admit: 2019-12-30 | Discharge: 2019-12-30 | Disposition: A | Discharge status: Home / Self Care | Payer: Medicare Other | Source: Ambulatory Visit | Attending: Gastroenterology | Admitting: Gastroenterology

## 2019-12-30 DIAGNOSIS — B182 Chronic viral hepatitis C: Secondary | ICD-10-CM | POA: Diagnosis not present

## 2019-12-30 DIAGNOSIS — K7031 Alcoholic cirrhosis of liver with ascites: Secondary | ICD-10-CM | POA: Insufficient documentation

## 2019-12-30 DIAGNOSIS — I9589 Other hypotension: Secondary | ICD-10-CM | POA: Insufficient documentation

## 2019-12-30 DIAGNOSIS — N5089 Other specified disorders of the male genital organs: Secondary | ICD-10-CM

## 2019-12-30 HISTORY — PX: IR PARACENTESIS: IMG2679

## 2019-12-30 IMAGING — US IR PARACENTESIS
1 series · 4 of 4 positions shown · non-contrast
Comparison: none

INDICATION: Patient with recurrent ascites. Request made for therapeutic
paracentesis.

[Series 1: ir (id) (id)/(id)/(id) ir · 4 of 4 slices shown]
[im 1/4]
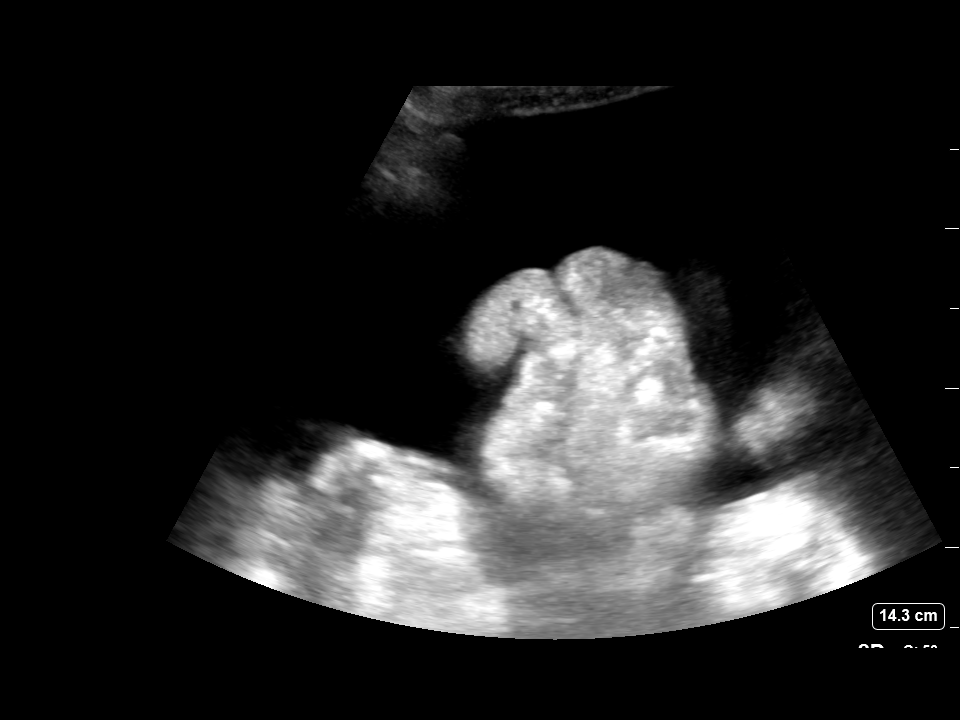
[im 2/4]
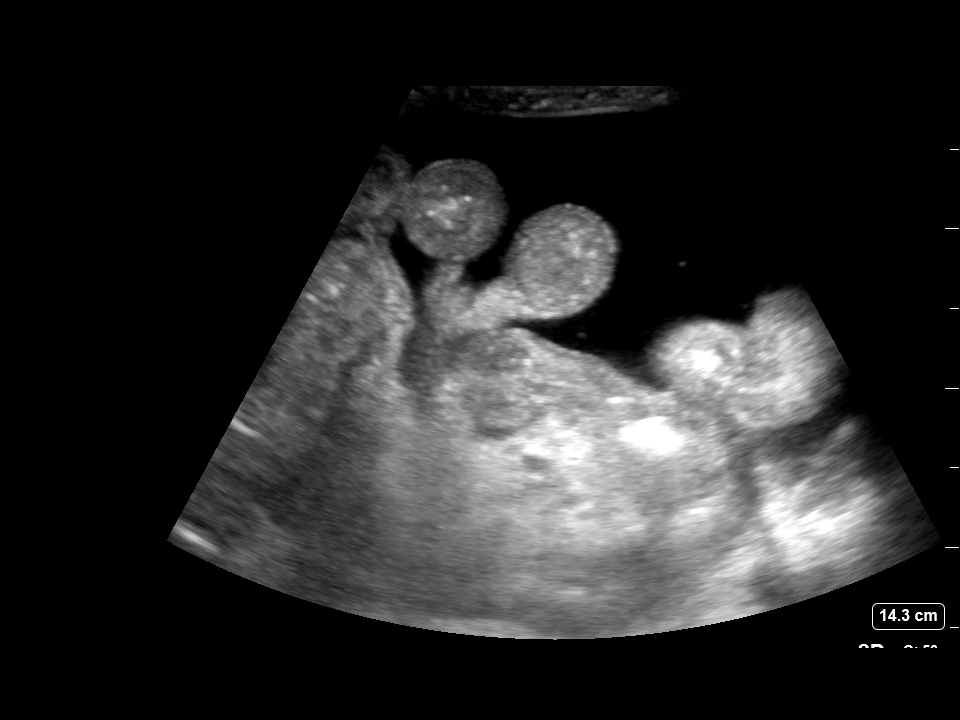
[im 3/4]
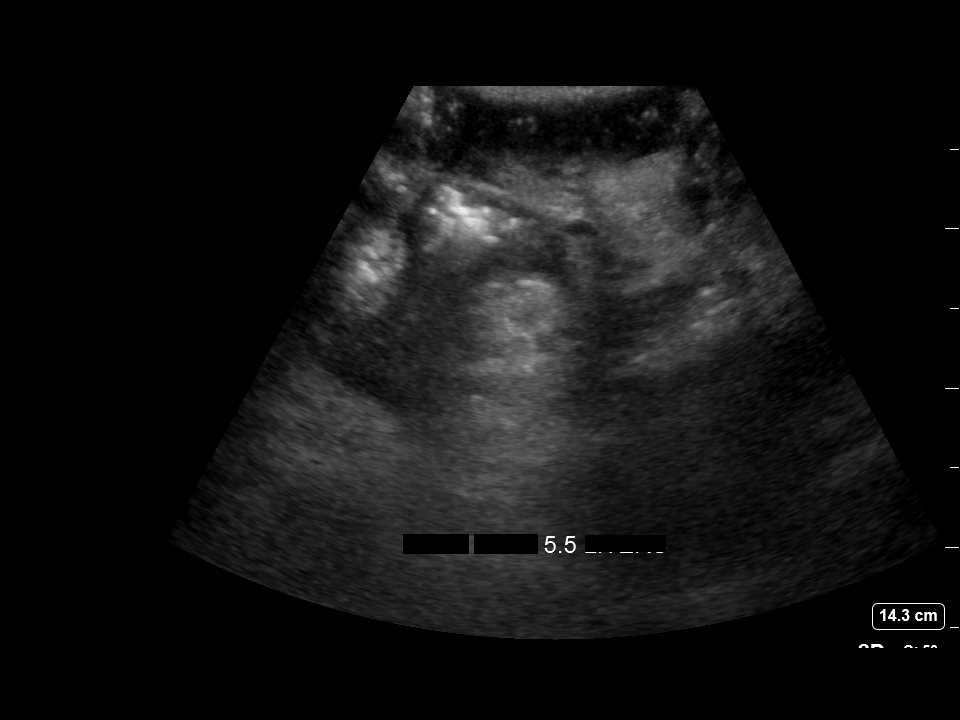
[im 4/4]
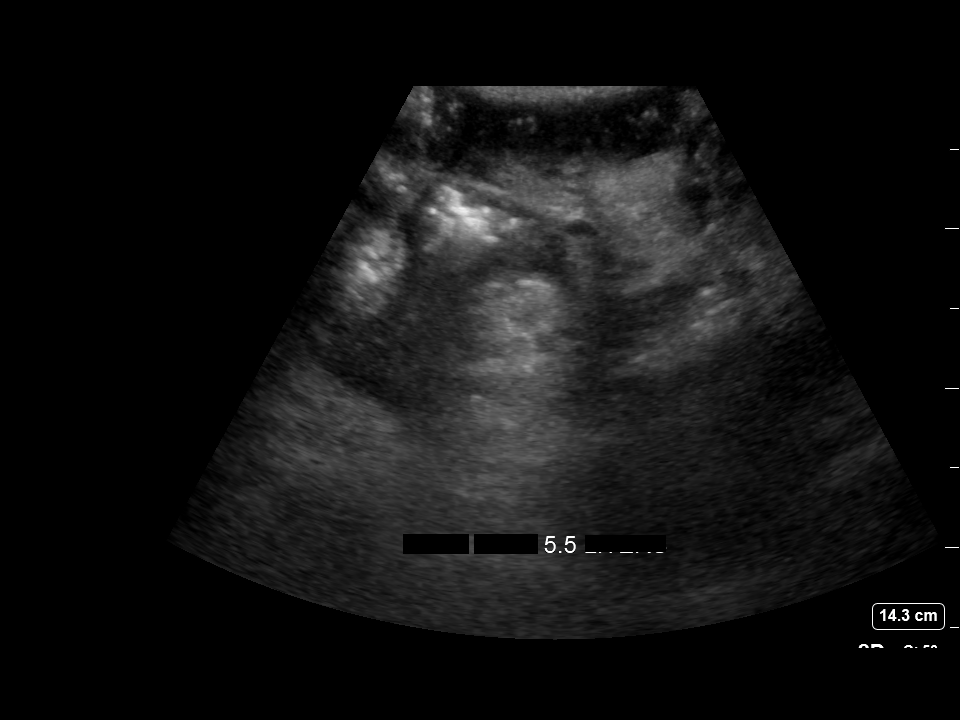

[4 of 4 positions shown; findings below may reference images not displayed]

EXAM:
ULTRASOUND GUIDED THERAPEUTIC PARACENTESIS

MEDICATIONS:
10 mL 1% lidocaine

COMPLICATIONS:
None immediate.

PROCEDURE:
Informed written consent was obtained from the patient after a
discussion of the risks, benefits and alternatives to treatment. A
timeout was performed prior to the initiation of the procedure.

Initial ultrasound scanning demonstrates a moderate amount of
ascites within the left lateral abdomen. The left lateral abdomen
was prepped and draped in the usual sterile fashion. 1% lidocaine
was used for local anesthesia.

Following this, a 6 Fr Safe-T-Centesis catheter was introduced. An
ultrasound image was saved for documentation purposes. The
paracentesis was performed. The catheter was removed and a dressing
was applied. The patient tolerated the procedure well without
immediate post procedural complication.
Patient received post-procedure intravenous albumin; see nursing
notes for details.
FINDINGS: A total of approximately 5.5 liters of yellow fluid was removed.
IMPRESSION: Successful ultrasound-guided paracentesis yielding 5.5 liters of
peritoneal fluid.

## 2019-12-30 MED ORDER — LIDOCAINE HCL 1 % IJ SOLN
INTRAMUSCULAR | Status: DC | PRN
  Administered 2019-12-30: 10 mL

## 2019-12-30 MED ORDER — ALBUMIN HUMAN 25 % IV SOLN
50.0000 g | Freq: Once | INTRAVENOUS | Status: AC
  Filled 2019-12-30: qty 200

## 2019-12-30 MED ORDER — LIDOCAINE HCL 1 % IJ SOLN
INTRAMUSCULAR | Status: AC
  Filled 2019-12-30: qty 20

## 2019-12-30 MED ORDER — ALBUMIN HUMAN 25 % IV SOLN
INTRAVENOUS | Status: AC
  Administered 2019-12-30: 50 g via INTRAVENOUS
  Filled 2019-12-30: qty 200

## 2019-12-30 NOTE — Procedures (Signed)
PROCEDURE SUMMARY:  Successful US guided paracentesis from left lateral.  Yielded 5.5 liters of yellow fluid.  No immediate complications.  Pt tolerated well.   Specimen was not sent for labs.  EBL < 55mL  Docia Barrier [name] 12/30/2019 11:47 AM

## 2020-01-02 ENCOUNTER — Telehealth: Payer: Self-pay | Admitting: Pharmacy Technician

## 2020-01-02 ENCOUNTER — Ambulatory Visit (INDEPENDENT_AMBULATORY_CARE_PROVIDER_SITE_OTHER): Payer: Medicare Other | Admitting: Primary Care

## 2020-01-02 ENCOUNTER — Telehealth: Payer: Self-pay | Admitting: Gastroenterology

## 2020-01-02 ENCOUNTER — Other Ambulatory Visit: Payer: Self-pay | Admitting: *Deleted

## 2020-01-02 DIAGNOSIS — K7031 Alcoholic cirrhosis of liver with ascites: Secondary | ICD-10-CM

## 2020-01-02 NOTE — Telephone Encounter (Signed)
[facility] Patient Advocate Encounter  Insurance verification completed.    The patient is insured through [employer].  We will continue to follow to see if copay assistance is needed.  Tajon Moring E. Deyvi Bonanno, CPhT [facility] Patient Advocate [facility] Phone: [phone] Fax:  [phone]   

## 2020-01-02 NOTE — Telephone Encounter (Signed)
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  ([name]-speaking)  We had set up several paracentesis to try and stay ahead of the volume overload before his next scheduled appointment with me on April 12.  He appears to have another paracentesis scheduled for later this week, and no further standing appointments after that.  Please arrange a paracentesis for early the week of March 29.  Same instructions as prior paracentesis.

## 2020-01-02 NOTE — Telephone Encounter (Signed)
Patient scheduled for LVP (max 7 L, 50 g albumin) on 01/16/20. [name] notified.

## 2020-01-04 ENCOUNTER — Encounter: Payer: Self-pay | Admitting: Infectious Diseases

## 2020-01-04 ENCOUNTER — Other Ambulatory Visit: Payer: Self-pay

## 2020-01-04 ENCOUNTER — Ambulatory Visit (INDEPENDENT_AMBULATORY_CARE_PROVIDER_SITE_OTHER): Payer: Medicare Other | Admitting: Infectious Diseases

## 2020-01-04 DIAGNOSIS — K7469 Other cirrhosis of liver: Secondary | ICD-10-CM | POA: Diagnosis present

## 2020-01-04 DIAGNOSIS — B182 Chronic viral hepatitis C: Secondary | ICD-10-CM

## 2020-01-04 NOTE — Patient Instructions (Addendum)
Please come back in 1 month to see Dr. Linus Salmons to discuss timing of treatment for your Hepatitis C infection.

## 2020-01-04 NOTE — Progress Notes (Signed)
Patient Name: Donald Aguilar  Date of Birth: 14-Sep-1951  MRN: [id number]  PCP: Kerin Perna, NP  Referring Provider: Kerin Perna, NP, Ph#: [phone]   Patient Active Problem List   Diagnosis Date Noted  . Chronic hepatitis C without hepatic coma ([place]) 09/28/2019  . Abnormal CT of the abdomen   . Erosive gastritis   . Cirrhosis of liver ([place]) 09/09/2019  . Ascites 09/09/2019  . Shortness of breath 09/09/2019    CC:  New patient - initial evaluation and management of chronic hepatitis C infection.  Interpretor present during the visit along with a caretaker / roommate from home.    HPI/ROS:  Donald Aguilar is a 69 y.o. male.   PMHx significant for decompensated cirrhosis d/t alcohol use and chronic untreated hepatitis C infection. He has a history of previously cleared hepatitis B infection as well with (-) sAg and (+) cAb. AFP 3.3 recently.   He used to drink alcohol but stopped September 2020.  Has required weekly large volume paracentesis with another due Friday of this week. They are worried he may need another one prior to his appointment with Dr. Loletha Carrow in April. Recently had his diuretics increased - this does make him dizzy at times but has been getting in the Midodrine TID regularly (his caretaker is here with him today during the visit and supplements some of the history).   He has trouble with swelling of the stomach frequently that causes early satiety and some anorexia for him. He has intermittently had what is described to be pitting edema of the lower extremities (not present today).  He also describes scrotal pain/swelling that is uncomfortable for him.   Patient does have documented immunity to Hepatitis A. Patient does have documented immunity to Hepatitis B.     Review of Systems:  Constitutional: negative for fevers, sweats and malaise; positive weight changes and anorexia  Eyes: negative for icterus Cardiovascular: negative for chest pain,  orthopnea, paroxysmal nocturnal dyspnea; positive for lower extremity edema  Gastrointestinal: positive for nausea and abdominal distension, negative for jaundice Genitourinary: positive for swelling of scrotum, negative for urinary incontinence, decreased stream and hematuria Hematologic/lymphatic: positive for easy bruising, negative for lymphadenopathy and petechiae Neurological: negative for headaches, paresthesia, coordination problems, gait problems and tremor Behavioral/Psych: negative for excessive alcohol consumption All other systems reviewed and are negative        Past Medical History:  Diagnosis Date  . 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 GERD (gastroesophageal reflux disease)     Prior to Admission medications   Medication Sig Start Date End Date Taking? Authorizing Provider  furosemide (LASIX) 40 MG tablet Take 1 tablet (40 mg total) by mouth daily. 10/19/19  Yes Esterwood, Amy S, [name]  midodrine (PROAMATINE) 5 MG tablet Take 1 tablet (5 mg total) by mouth 3 (three) times daily with meals. 12/14/19  Yes Danis, Estill Cotta III, MD  pantoprazole (PROTONIX) 40 MG tablet TAKE 1  BY MOUTH TWICE DAILY 11/03/19  Yes Esterwood, Amy S, [name]  potassium chloride SA (KLOR-CON) 20 MEQ tablet Take 2 tablets (40 mEq total) by mouth daily. 12/15/19 01/14/20 Yes Danis, Kirke Corin, MD  spironolactone (ALDACTONE) 50 MG tablet Take 1 tablet (50 mg total) by mouth daily. Patient taking differently: Take 100 mg by mouth daily. Take 2 tablets once a day 10/19/19  Yes Esterwood, Amy S, [name]  albumin human 5 % bottle Inject 250 mLs (12.5 g total) into the vein  as needed. For paracentesis greater than 4L 11/02/19   Curatolo, Adam, DO    No Known Allergies  Social History   Tobacco Use  . Smoking status: Current Every Day Smoker    Years: 50.00    Types: Cigarettes  . Smokeless tobacco: Never Used  . Tobacco comment: approximately a couple of packs a week  Substance Use Topics  .  Alcohol use: Not Currently    Comment: last drank in September, 2020  . Drug use: Never    Family History  Problem Relation Age of Onset  . Liver cancer Neg Hx      Objective:   Vitals:   01/04/20 1430  BP: 104/70  Pulse: 84  Temp: 97.6 F (36.4 C)  SpO2: 99%    Physical Exam:  Constitutional: in no apparent distress and anicteric Eyes: anicteric Cardiovascular: Cor RRR and No murmurs Respiratory: clear Gastrointestinal: Distended with +fluid wave and shifting dullness present. Bruising noted at previous paracentesis site, umbilicus protruding. Bowel sounds are normal, liver is not enlarged, spleen is not enlarged Musculoskeletal: peripheral pulses normal, no pedal edema, no clubbing or cyanosis Skin: negative for - jaundice, spider hemangioma, telangiectasia, palmar erythema, ecchymosis and atrophy; no porphyria cutanea tarda Lymphatic: no cervical lymphadenopathy   Laboratory: Genotype:  Lab Results  Component Value Date   HCVGENOTYPE 1a 09/12/2019   HCV viral load:  Lab Results  Component Value Date   HCVQUANT QUANTITY NOT SUFFICIENT, UNABLE TO PERFORM TEST 09/10/2019   Lab Results  Component Value Date   WBC 6.1 11/02/2019   HGB 12.0 (L) 11/02/2019   HCT 35.6 (L) 11/02/2019   MCV 100.0 11/02/2019   PLT 226 11/02/2019    Lab Results  Component Value Date   CREATININE 0.77 12/14/2019   BUN 11 12/14/2019   NA 133 (L) 12/14/2019   K 3.1 (L) 12/14/2019   CL 97 12/14/2019   CO2 29 12/14/2019    Lab Results  Component Value Date   ALT 26 11/02/2019   AST 67 (H) 11/02/2019   ALKPHOS 64 11/02/2019    Lab Results  Component Value Date   INR 1.6 (H) 11/02/2019   BILITOT 2.0 (H) 11/02/2019   ALBUMIN 2.1 (L) 11/02/2019     Assessment & Plan:   Problem List Items Addressed This Visit      Unprioritized   Cirrhosis of liver (HCC)    Decompensated cirrhotic patient in management with Dr. Loletha Carrow. Recently increased his diuretics and seems it is  slowly getting under better control since January 2021 documentation reflects. On exam he has protuberant abdomen with ascites present. No peripheral edema but does have some secondary scrotal edema present.   Will have him return in 4 weeks to see Dr. Linus Salmons to re-evaluate for timing to treat hepatitis C while his diuretics are optimized.        Chronic hepatitis C without hepatic coma (HCC)    Genotype 1A complicated by decompensated cirrhosis requiring large volume paracentesis on a weekly basis. He is treatment naive. He has overall preserved PLT and WBC count with only mild anemia. I think he would be a good candidate for Ribivarin therapy paired with Harvoni x 12 weeks. Would need to have him hold Protonix during this time.   I discussed with the patient the lab findings that confirm chronic hepatitis C as well as the natural history and progression of disease including about 30% of people who develop cirrhosis of the liver if left untreated and once  cirrhosis is established there is a 2-7% risk per year of liver cancer and liver failure.  His recent AFP of 3.3 does not suggest active [place] but has never had MRI imaging to evaluate more definitively.   I discussed the importance of treatment and benefits in reducing the risk, even if significant liver fibrosis exists. I also discussed risk for re-infection following treatment should he not continue to modify risk factors.     Patient counseled extensively on limiting acetaminophen to no more than 2 grams daily, avoidance of alcohol.  Transmission discussed with patient including sexual transmission, sharing razors and toothbrush.   Working with gastroenterology to manage ascites / cirrhosis  Hepatitis A and B immune s/p natural infection for HBV  Pneumovax vaccine at upcoming visit if not previously given   I think we can probably go ahead with treating him soon - risk of course would be further decompensation in liver failure. Will give  him another month on diuretics and have him back with Dr. Linus Salmons to re-evaluate timing.          I spent 45 minutes with the patient including greater than 70% of time in face to face counsel of the patient re hepatitis c and the details described above and in coordination of their care.  Janene Madeira, MSN, [name] [facility] [email or web address] Pager: [phone] Office: [phone] [place]: [phone]

## 2020-01-05 NOTE — Progress Notes (Signed)
Perfect thank you!

## 2020-01-05 NOTE — Assessment & Plan Note (Addendum)
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 He is treatment naive. He has overall preserved PLT and WBC count with only mild anemia. I think he would be a good candidate for Ribivarin therapy paired with Harvoni x 12 weeks. Would need to have him hold Protonix during this time.   I discussed with the patient the lab findings that confirm chronic hepatitis C as well as the natural history and progression of disease including about 30% of people who develop cirrhosis of the liver if left untreated and once cirrhosis is established there is a 2-7% risk per year of liver cancer and liver failure.  His recent AFP of 3.3 does not suggest active [place] but has never had MRI imaging to evaluate more definitively.   I discussed the importance of treatment and benefits in reducing the risk, even if significant liver fibrosis exists. I also discussed risk for re-infection following treatment should he not continue to modify risk factors.     Patient counseled extensively on limiting acetaminophen to no more than 2 grams daily, avoidance of alcohol.  Transmission discussed with patient including sexual transmission, sharing razors and toothbrush.   Working with gastroenterology to manage ascites / cirrhosis  Hepatitis A and B immune s/p natural infection for HBV  Pneumovax vaccine at upcoming visit if not previously given   I think we can probably go ahead with treating him soon - risk of course would be further decompensation in liver failure. Will give him another month on diuretics and have him back with Dr. Linus Salmons to re-evaluate timing.

## 2020-01-05 NOTE — Assessment & Plan Note (Signed)
Decompensated cirrhotic patient in management with Dr. Loletha Carrow. Recently increased his diuretics and seems it is slowly getting under better control since January 2021 documentation reflects. On exam he has protuberant abdomen with ascites present. No peripheral edema but does have some secondary scrotal edema present.   Will have him return in 4 weeks to see Dr. Linus Salmons to re-evaluate for timing to treat hepatitis C while his diuretics are optimized.

## 2020-01-06 ENCOUNTER — Other Ambulatory Visit: Payer: Self-pay

## 2020-01-06 ENCOUNTER — Ambulatory Visit (HOSPITAL_COMMUNITY)
Admission: RE | Admit: 2020-01-06 | Discharge: 2020-01-06 | Disposition: A | Discharge status: Home / Self Care | Payer: Medicare Other | Source: Ambulatory Visit | Attending: Gastroenterology | Admitting: Gastroenterology

## 2020-01-06 DIAGNOSIS — B182 Chronic viral hepatitis C: Secondary | ICD-10-CM | POA: Insufficient documentation

## 2020-01-06 DIAGNOSIS — K7031 Alcoholic cirrhosis of liver with ascites: Secondary | ICD-10-CM | POA: Diagnosis present

## 2020-01-06 DIAGNOSIS — N5089 Other specified disorders of the male genital organs: Secondary | ICD-10-CM | POA: Diagnosis not present

## 2020-01-06 DIAGNOSIS — I9589 Other hypotension: Secondary | ICD-10-CM | POA: Diagnosis not present

## 2020-01-06 HISTORY — PX: IR PARACENTESIS: IMG2679

## 2020-01-06 IMAGING — US IR PARACENTESIS
1 series · 4 of 4 positions shown · non-contrast
Comparison: none

INDICATION: 69-year-old male with history acholics cirrhosis presents for
therapeutic paracentesis. Maximum 7 L

[Series 1: ir (id) (id)/(id)/(id) ir · 4 of 4 slices shown]
[im 1/4]
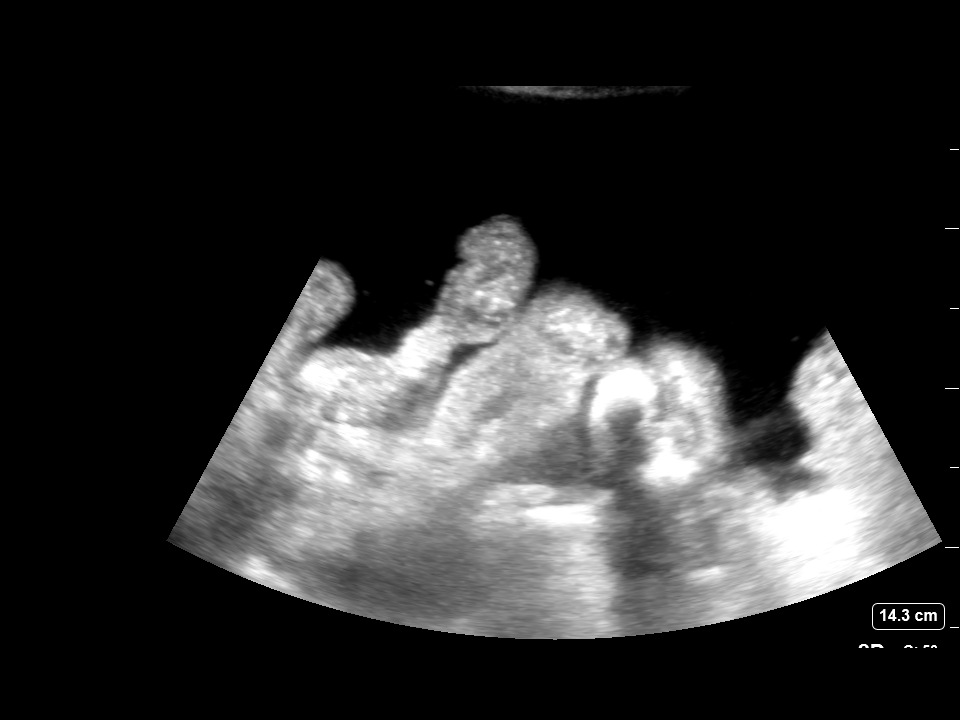
[im 2/4]
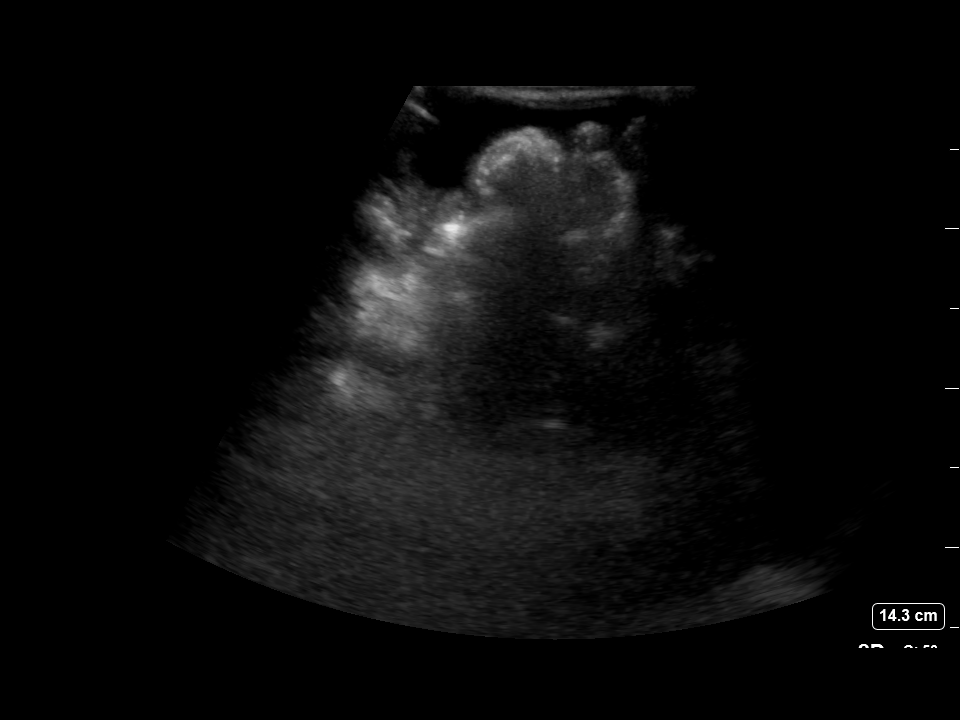
[im 3/4]
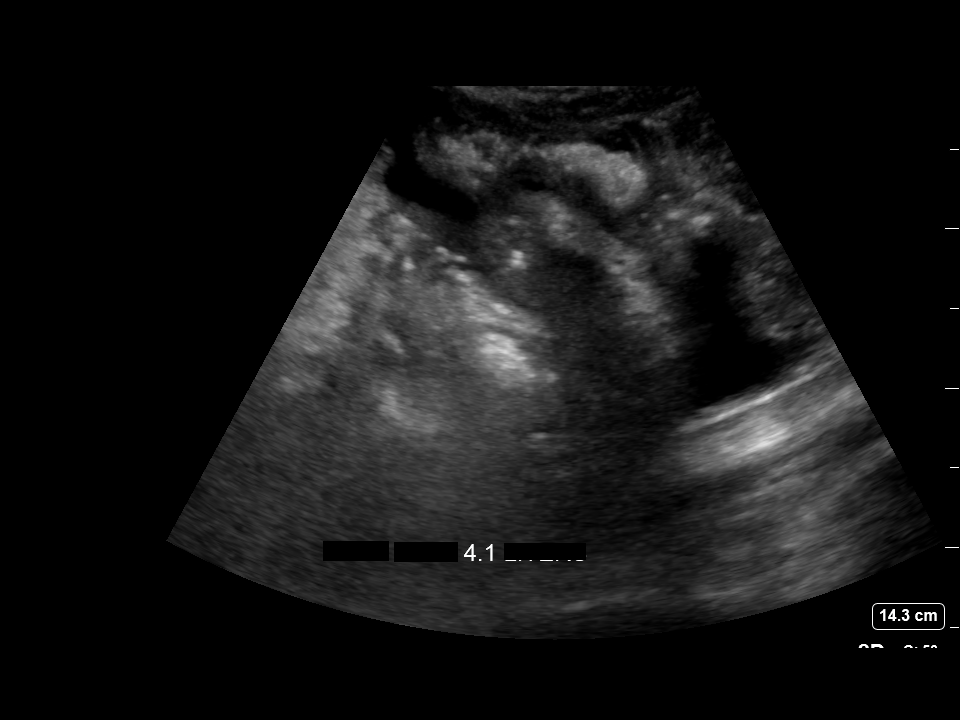
[im 4/4]
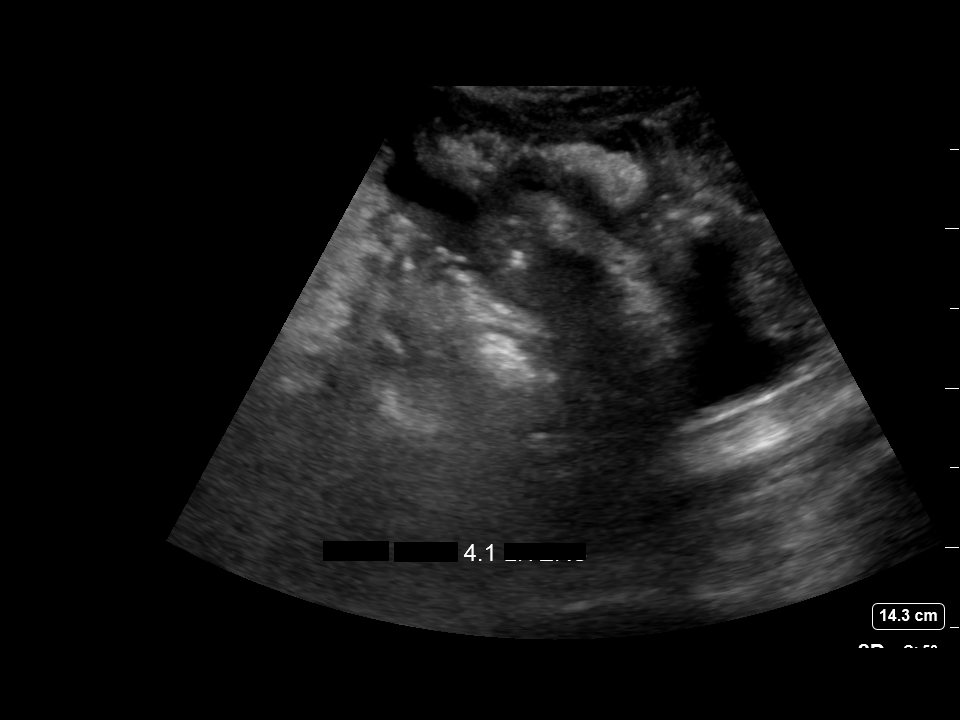

[4 of 4 positions shown; findings below may reference images not displayed]

EXAM:
ULTRASOUND GUIDED THERAPEUTIC PARACENTESIS

MEDICATIONS:
Lidocaine 1% -10 mL

COMPLICATIONS:
None immediate.

PROCEDURE:
Informed written consent was obtained from the patient after a
discussion of the risks, benefits and alternatives to treatment. A
timeout was performed prior to the initiation of the procedure.

Initial ultrasound scanning demonstrates a large amount of ascites
within the right lower abdominal quadrant. The right lower abdomen
was prepped and draped in the usual sterile fashion. 1% lidocaine
was used for local anesthesia.

Following this, a 19 gauge, 7-cm, Yueh catheter was introduced. An
ultrasound image was saved for documentation purposes. The
paracentesis was performed. The catheter was removed and a dressing
was applied. The patient tolerated the procedure well without
immediate post procedural complication.
Patient received post-procedure intravenous albumin; see nursing
notes for details.
FINDINGS: A total of approximately 4.1 L of straw colored fluid was removed.
IMPRESSION: Successful ultrasound-guided therapeutic paracentesis yielding
liters of peritoneal fluid.

Read by OREILLY NP

## 2020-01-06 MED ORDER — LIDOCAINE HCL 1 % IJ SOLN
INTRAMUSCULAR | Status: AC
  Filled 2020-01-06: qty 20

## 2020-01-06 MED ORDER — LIDOCAINE HCL 1 % IJ SOLN
INTRAMUSCULAR | Status: DC | PRN
  Administered 2020-01-06: 10 mL

## 2020-01-06 NOTE — Procedures (Signed)
Ultrasound-guided therapeutic paracentesis performed yielding 4.1 liters of straw colored fluid. No immediate complications. EBL is none.  

## 2020-01-16 ENCOUNTER — Ambulatory Visit (HOSPITAL_COMMUNITY)
Admission: RE | Admit: 2020-01-16 | Discharge: 2020-01-16 | Disposition: A | Discharge status: Home / Self Care | Payer: Medicare Other | Source: Ambulatory Visit | Attending: Gastroenterology | Admitting: Gastroenterology

## 2020-01-16 ENCOUNTER — Other Ambulatory Visit: Payer: Self-pay

## 2020-01-16 ENCOUNTER — Other Ambulatory Visit: Payer: Self-pay | Admitting: Physician Assistant

## 2020-01-16 DIAGNOSIS — K7031 Alcoholic cirrhosis of liver with ascites: Secondary | ICD-10-CM | POA: Insufficient documentation

## 2020-01-16 HISTORY — PX: IR PARACENTESIS: IMG2679

## 2020-01-16 IMAGING — US IR PARACENTESIS
1 series · 2 of 2 positions shown · non-contrast
Comparison: none

INDICATION: Patient with history of alcoholic cirrhosis, recurrent ascites.
Request to IR for therapeutic paracentesis with 7 L maximum.

[Series 1: ir (id) (id)/(id)/(id) ir · 2 of 2 slices shown]
[im 1/2]
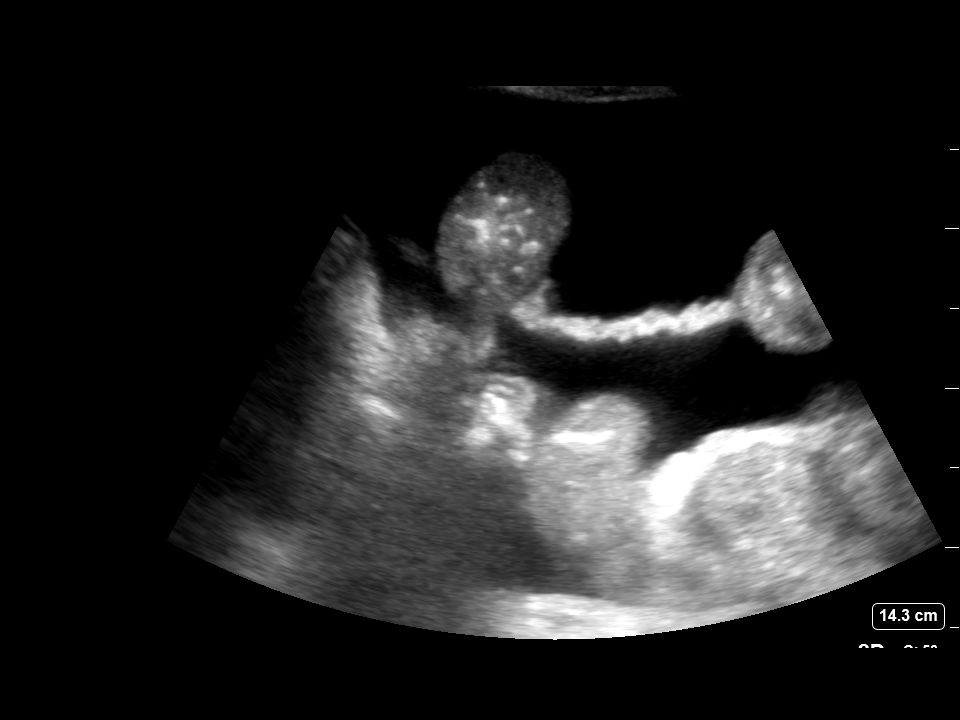
[im 2/2]
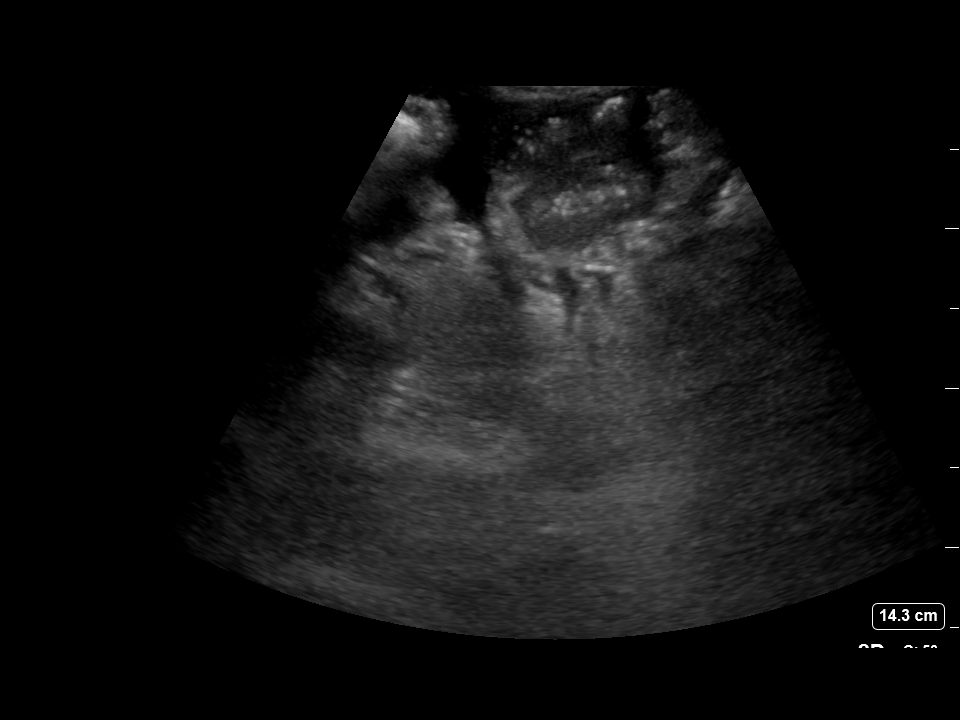

[2 of 2 positions shown; findings below may reference images not displayed]

EXAM:
ULTRASOUND GUIDED THERAPEUTIC PARACENTESIS

MEDICATIONS:
20 mL 1% lidocaine

COMPLICATIONS:
None immediate.

PROCEDURE:
Informed written consent was obtained from the patient after a
discussion of the risks, benefits and alternatives to treatment. A
timeout was performed prior to the initiation of the procedure.

Initial ultrasound scanning demonstrates a large amount of ascites
within the left lower abdominal quadrant. The left lower abdomen was
prepped and draped in the usual sterile fashion. 1% lidocaine was
used for local anesthesia.

Following this, a 19 gauge, 7-cm, Yueh catheter was introduced. An
ultrasound image was saved for documentation purposes. The
paracentesis was performed. The catheter was removed and a dressing
was applied. The patient tolerated the procedure well without
immediate post procedural complication.
Patient received post-procedure intravenous albumin; see nursing
notes for details.
FINDINGS: A total of approximately 3.5 L of clear yellow fluid was removed.
Samples were sent to the laboratory as requested by the clinical
team.
IMPRESSION: Successful ultrasound-guided paracentesis yielding 3.5 liters of
peritoneal fluid.

Read by JUMPER

## 2020-01-16 MED ORDER — LIDOCAINE HCL 1 % IJ SOLN
INTRAMUSCULAR | Status: AC
  Filled 2020-01-16: qty 20

## 2020-01-16 MED ORDER — LIDOCAINE HCL 1 % IJ SOLN
INTRAMUSCULAR | Status: DC | PRN
  Administered 2020-01-16: 10 mL

## 2020-01-16 NOTE — Procedures (Signed)
PROCEDURE SUMMARY:  Successful image-guided paracentesis from the left lower abdomen.  Yielded 3.5 liters of clear yellow fluid.  No immediate complications.  EBL: zero Patient tolerated well.   Specimen was not sent for labs.  Please see imaging section of Epic for full dictation.  Joaquim Nam [name] 01/16/2020 10:18 AM

## 2020-01-30 ENCOUNTER — Encounter: Payer: Self-pay | Admitting: Gastroenterology

## 2020-01-30 ENCOUNTER — Ambulatory Visit (INDEPENDENT_AMBULATORY_CARE_PROVIDER_SITE_OTHER): Payer: Medicare Other | Admitting: Gastroenterology

## 2020-01-30 ENCOUNTER — Other Ambulatory Visit (INDEPENDENT_AMBULATORY_CARE_PROVIDER_SITE_OTHER): Payer: Medicare Other

## 2020-01-30 VITALS — BP 104/70 | HR 88 | Temp 97.4°F | Ht 64.5 in | Wt 129.4 lb

## 2020-01-30 DIAGNOSIS — N5089 Other specified disorders of the male genital organs: Secondary | ICD-10-CM

## 2020-01-30 DIAGNOSIS — B182 Chronic viral hepatitis C: Secondary | ICD-10-CM | POA: Diagnosis not present

## 2020-01-30 DIAGNOSIS — K7031 Alcoholic cirrhosis of liver with ascites: Secondary | ICD-10-CM

## 2020-01-30 LAB — CBC WITH DIFFERENTIAL/PLATELET
Basophils Absolute: 0.1 10*3/uL (ref 0.0–0.1)
Basophils Relative: 1 % (ref 0.0–3.0)
Eosinophils Absolute: 0.3 10*3/uL (ref 0.0–0.7)
Eosinophils Relative: 4.4 % (ref 0.0–5.0)
HCT: 35.2 % — ABNORMAL LOW (ref 39.0–52.0)
Hemoglobin: 12.1 g/dL — ABNORMAL LOW (ref 13.0–17.0)
Lymphocytes Relative: 20.4 % (ref 12.0–46.0)
Lymphs Abs: 1.5 10*3/uL (ref 0.7–4.0)
MCHC: 34.4 g/dL (ref 30.0–36.0)
MCV: 98.6 fl (ref 78.0–100.0)
Monocytes Absolute: 0.7 10*3/uL (ref 0.1–1.0)
Monocytes Relative: 10 % (ref 3.0–12.0)
Neutro Abs: 4.6 10*3/uL (ref 1.4–7.7)
Neutrophils Relative %: 64.2 % (ref 43.0–77.0)
Platelets: 266 10*3/uL (ref 150.0–400.0)
RBC: 3.57 Mil/uL — ABNORMAL LOW (ref 4.22–5.81)
RDW: 14.1 % (ref 11.5–15.5)
WBC: 7.2 10*3/uL (ref 4.0–10.5)

## 2020-01-30 LAB — COMPREHENSIVE METABOLIC PANEL
ALT: 24 U/L (ref 0–53)
AST: 48 U/L — ABNORMAL HIGH (ref 0–37)
Albumin: 2.6 g/dL — ABNORMAL LOW (ref 3.5–5.2)
Alkaline Phosphatase: 109 U/L (ref 39–117)
BUN: 10 mg/dL (ref 6–23)
CO2: 26 mEq/L (ref 19–32)
Calcium: 8.4 mg/dL (ref 8.4–10.5)
Chloride: 104 mEq/L (ref 96–112)
Creatinine, Ser: 0.69 mg/dL (ref 0.40–1.50)
GFR: 114.32 mL/min (ref >60.00)
Glucose, Bld: 96 mg/dL (ref 70–99)
Potassium: 3.8 mEq/L (ref 3.5–5.1)
Sodium: 135 mEq/L (ref 135–145)
Total Bilirubin: 1.5 mg/dL — ABNORMAL HIGH (ref 0.2–1.2)
Total Protein: 7.8 g/dL (ref 6.0–8.3)

## 2020-01-30 LAB — PROTIME-INR
INR: 1.2 ratio — ABNORMAL HIGH (ref 0.8–1.0)
Prothrombin Time: 13.2 s — ABNORMAL HIGH (ref 9.6–13.1)

## 2020-01-30 NOTE — Patient Instructions (Signed)
If you are age 67 or older, your body mass index should be between 23-30. Your Body mass index is 21.86 kg/m. If this is out of the aforementioned range listed, please consider follow up with your Primary Care Provider.  If you are age 107 or younger, your body mass index should be between 19-25. Your Body mass index is 21.86 kg/m. If this is out of the aformentioned range listed, please consider follow up with your Primary Care Provider.   You have been scheduled for an abdominal paracentesis at [facility] on 02-08-2020 at 10am. Please arrive at least 15 minutes prior to your appointment time for registration. Should you need to reschedule this appointment for any reason, please call our office at [phone].  Your provider has requested that you go to the basement level for lab work before leaving today. Press "B" on the elevator. The lab is located at the first door on the left as you exit the elevator.  It was a pleasure to see you today!  Dr. Loletha Carrow

## 2020-01-30 NOTE — Progress Notes (Addendum)
[facility] Progress Note  Chief Complaint: Cirrhosis with advanced portal hypertension and large volume ascites due to alcohol abuse  Subjective  History: 6-week clinic follow-up, getting regular therapeutic paracentesis while on low-dose diuretics.  Blood pressure and renal function has thus far not tolerated increased doses of diuretics.  Also on midodrine to help support blood pressure. Potassium low 3.1 on most recent labs after clinic visit, 40 mEq potassium chloride added daily. Infectious disease consult 01/04/2020, considering treatment with Harvoni and ribavirin, concerned about his decompensation and volume overload.  Donald Aguilar continues to struggle with generalized abdominal discomfort from large volume ascites, makes it difficult to sleep.  He also continues to have severe scrotal edema.  It does not seem that he got a scrotal support device as advised at last visit.  He does not seem to have recalled or understood that.  Concerned about the extent of his liver disease, worried about prognosis, was not sure what the discussion about his hep C treatment entailed at recent ID visit.  [facility] interpreter present)  ROS: Cardiovascular:  no chest pain Respiratory: Dyspnea with exertion when his ascites becomes large volume Affects his appetite Does not get dizzy when he stands Has generalized fatigue Remainder of systems negative except as above  The patient's Past Medical, Family and Social History were reviewed and are on file in the EMR.  Objective:  Med list reviewed  Current Outpatient Medications:  .  furosemide (LASIX) 40 MG tablet, Take 1 tablet (40 mg total) by mouth daily., Disp: 30 tablet, Rfl: 3 .  midodrine (PROAMATINE) 5 MG tablet, Take 1 tablet (5 mg total) by mouth 3 (three) times daily with meals., Disp: 90 tablet, Rfl: 1 .  pantoprazole (PROTONIX) 40 MG tablet, Take 1 tablet by mouth twice daily, Disp: 60 tablet, Rfl: 0 .  potassium chloride SA  (KLOR-CON) 20 MEQ tablet, Take 2 tablets (40 mEq total) by mouth daily., Disp: 60 tablet, Rfl: 1 .  spironolactone (ALDACTONE) 50 MG tablet, Take 1 tablet (50 mg total) by mouth daily. (Patient taking differently: Take 100 mg by mouth daily. Take 2 tablets once a day), Disp: 30 tablet, Rfl: 3   Vital signs in last 24 hrs: Vitals:   01/30/20 0830  BP: 104/70  Pulse: 88  Temp: (!) 97.4 F (36.3 C)    Physical Exam  Frail, diffuse muscle wasting.  Needs assistance getting on exam table  HEENT: sclera anicteric, oral mucosa moist without lesions  Neck: supple, no thyromegaly, JVD or lymphadenopathy  Cardiac: RRR without murmurs, S1S2 heard, no peripheral edema  Pulm: clear to auscultation bilaterally, normal RR and effort noted  Abdomen: soft, large volume ascites, not tense, umbilicus protruding, mild generalized with active bowel sounds. No guarding or palpable hepatosplenomegaly appreciated  Skin; warm and dry, no jaundice or rash.  Easy bruising  No asterixis.  Interpreter says his speech is fluent He has tense and tender scrotal edema (interpreter stepped out of the room for exam) Labs:  CMP Latest Ref Rng & Units 01/30/2020 12/14/2019 11/08/2019  Glucose 70 - 99 mg/dL 96 93 101(H)  BUN 6 - 23 mg/dL 10 11 13   Creatinine 0.40 - 1.50 mg/dL 0.69 0.77 0.85  Sodium 135 - 145 mEq/L 135 133(L) 133(L)  Potassium 3.5 - 5.1 mEq/L 3.8 3.1(L) 3.4(L)  Chloride 96 - 112 mEq/L 104 97 96  CO2 19 - 32 mEq/L 26 29 31   Calcium 8.4 - 10.5 mg/dL 8.4 9.1 8.6  Total Protein  6.0 - 8.3 g/dL 7.8 - -  Total Bilirubin 0.2 - 1.2 mg/dL 1.5(H) - -  Alkaline Phos 39 - 117 U/L 109 - -  AST 0 - 37 U/L 48(H) - -  ALT 0 - 53 U/L 24 - -   INR today 1.2  CBC Latest Ref Rng & Units 01/30/2020 11/02/2019 10/12/2019  WBC 4.0 - 10.5 K/uL 7.2 6.1 7.2  Hemoglobin 13.0 - 17.0 g/dL 12.1(L) 12.0(L) 11.6(L)  Hematocrit 39.0 - 52.0 % 35.2(L) 35.6(L) 35.7(L)  Platelets 150.0 - 400.0 K/uL 266.0 226 190     ___________________________________________ Radiologic studies:  Paracentesis on March 5, 12th, 19th and 29th  ____________________________________________ Other:   _____________________________________________ Assessment & Plan  Assessment: Encounter Diagnoses  Name Primary?  . Alcoholic cirrhosis of liver with ascites ([place]) Yes  . Chronic hepatitis C without hepatic coma ([place])   . Scrotal edema    Decompensated cirrhosis from prior alcohol abuse.  No alcohol since hospitalization late last year.  Biggest problem is ongoing large volume ascites and scrotal edema causing discomfort.  Psychosocial support questionable, would seem to make for a high risk TIPS candidate.  Hopefully can tolerate HCV therapy to try and clear virus and prevent further decompensation.  Plan: CBC/CMP/INR today Our staff arranged weekly large-volume paracentesis x4.  Maximum 7 L fluid removal, 50 g IV albumin given.  Do not take spironolactone or furosemide the day of fluid tap.  Scrotal support device recommended.  Might find it at pharmacy or medical supply store.  I wrote on the back my business card what I think he needs so he could presented at a store and hopefully get some help there.  Follow-up clinic visit 4 to 5 weeks.  40 minutes were spent on this encounter (including chart review, history/exam, counseling/coordination of care, and documentation)  Donald Aguilar  Lab result addendum.  Labs done after today's visit were updated into this note later in the day.  Meld score 10   - Wilfrid Lund, MD    [facility]

## 2020-02-01 ENCOUNTER — Ambulatory Visit (HOSPITAL_COMMUNITY)
Admission: RE | Admit: 2020-02-01 | Discharge: 2020-02-01 | Disposition: A | Discharge status: Home / Self Care | Payer: Medicare Other | Source: Ambulatory Visit | Attending: Gastroenterology | Admitting: Gastroenterology

## 2020-02-01 ENCOUNTER — Other Ambulatory Visit: Payer: Self-pay

## 2020-02-01 DIAGNOSIS — N5089 Other specified disorders of the male genital organs: Secondary | ICD-10-CM | POA: Insufficient documentation

## 2020-02-01 DIAGNOSIS — K7031 Alcoholic cirrhosis of liver with ascites: Secondary | ICD-10-CM | POA: Insufficient documentation

## 2020-02-01 DIAGNOSIS — B182 Chronic viral hepatitis C: Secondary | ICD-10-CM | POA: Insufficient documentation

## 2020-02-01 HISTORY — PX: IR PARACENTESIS: IMG2679

## 2020-02-01 IMAGING — US IR PARACENTESIS
1 series · 2 of 2 positions shown · non-contrast
Comparison: none

INDICATION: Patient with history of cirrhosis, recurrent ascites. Request is
made for therapeutic paracentesis. Patient to receive albumin with
procedure today.

[Series 1: ir (id) (id)/(id)/(id) ir · 2 of 2 slices shown]
[im 1/2]
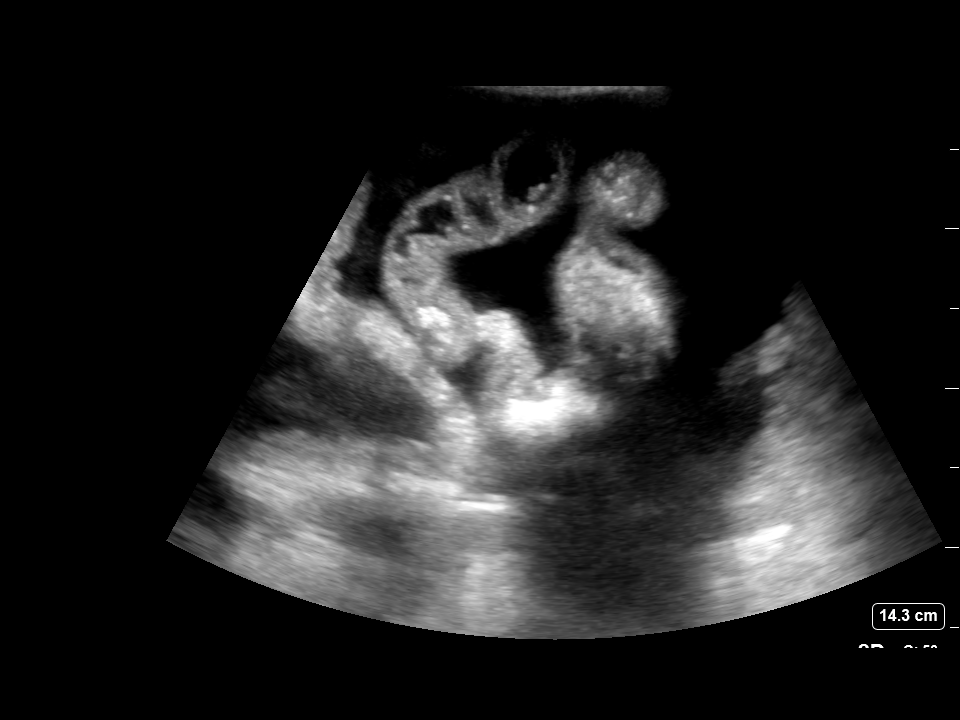
[im 2/2]
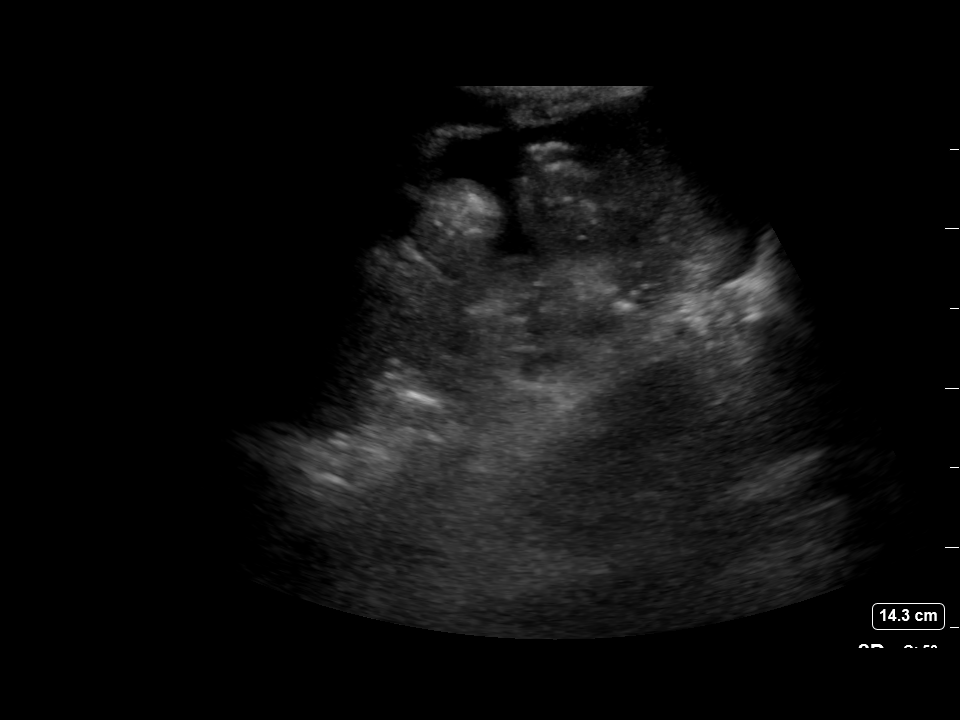

[2 of 2 positions shown; findings below may reference images not displayed]

EXAM:
ULTRASOUND GUIDED THERAPEUTIC PARACENTESIS

MEDICATIONS:
10 mL 1% lidocaine

COMPLICATIONS:
None immediate.

PROCEDURE:
Informed written consent was obtained from the patient after a
discussion of the risks, benefits and alternatives to treatment. A
timeout was performed prior to the initiation of the procedure.

Initial ultrasound scanning demonstrates a moderate amount of
ascites within the left lateral abdomen. The left lateral abdomen
was prepped and draped in the usual sterile fashion. 1% lidocaine
was used for local anesthesia.

Following this, a 6 Fr Safe-T-Centesis catheter was introduced. An
ultrasound image was saved for documentation purposes. The
paracentesis was performed. The catheter was removed and a dressing
was applied. The patient tolerated the procedure well without
immediate post procedural complication.
Patient received post-procedure intravenous albumin; see nursing
notes for details.
FINDINGS: A total of approximately 5.6 liters of yellow fluid was removed.
Samples were sent to the laboratory as requested by the clinical
team.
IMPRESSION: Successful ultrasound-guided therapeutic paracentesis yielding
liters of peritoneal fluid.

## 2020-02-01 MED ORDER — ALBUMIN HUMAN 25 % IV SOLN
INTRAVENOUS | Status: AC
  Administered 2020-02-01: 50 g via INTRAVENOUS
  Filled 2020-02-01: qty 200

## 2020-02-01 MED ORDER — LIDOCAINE HCL 1 % IJ SOLN
INTRAMUSCULAR | Status: AC
  Filled 2020-02-01: qty 20

## 2020-02-01 MED ORDER — LIDOCAINE HCL (PF) 1 % IJ SOLN
INTRAMUSCULAR | Status: DC | PRN
  Administered 2020-02-01: 5 mL

## 2020-02-01 MED ORDER — ALBUMIN HUMAN 25 % IV SOLN
50.0000 g | Freq: Once | INTRAVENOUS | Status: AC
  Filled 2020-02-01: qty 200

## 2020-02-01 NOTE — Progress Notes (Signed)
BP at discharge 101/69. Patient taken out via wheelchair.

## 2020-02-01 NOTE — Procedures (Signed)
PROCEDURE SUMMARY:  Successful US guided paracentesis from left lateral abdomen.  Yielded 5.6 liters of yellow fluid.  No immediate complications.  Pt tolerated well.   Specimen was not sent for labs.  EBL < 42mL  Docia Barrier [name] 02/01/2020 10:44 AM

## 2020-02-08 ENCOUNTER — Other Ambulatory Visit: Payer: Self-pay

## 2020-02-08 ENCOUNTER — Ambulatory Visit (HOSPITAL_COMMUNITY)
Admission: RE | Admit: 2020-02-08 | Discharge: 2020-02-08 | Disposition: A | Discharge status: Home / Self Care | Payer: Medicare Other | Source: Ambulatory Visit | Attending: Gastroenterology | Admitting: Gastroenterology

## 2020-02-08 DIAGNOSIS — N5089 Other specified disorders of the male genital organs: Secondary | ICD-10-CM

## 2020-02-08 DIAGNOSIS — K7031 Alcoholic cirrhosis of liver with ascites: Secondary | ICD-10-CM | POA: Diagnosis present

## 2020-02-08 DIAGNOSIS — B182 Chronic viral hepatitis C: Secondary | ICD-10-CM | POA: Insufficient documentation

## 2020-02-08 HISTORY — PX: IR PARACENTESIS: IMG2679

## 2020-02-08 IMAGING — US IR PARACENTESIS
1 series · 5 of 5 positions shown · non-contrast
Comparison: none

INDICATION: History of cirrhosis, recurrent ascites. Request for therapeutic
paracentesis.

[Series 1: ir (id) (id)/(id)/(id) ir · 5 of 5 slices shown]
[im 1/5]
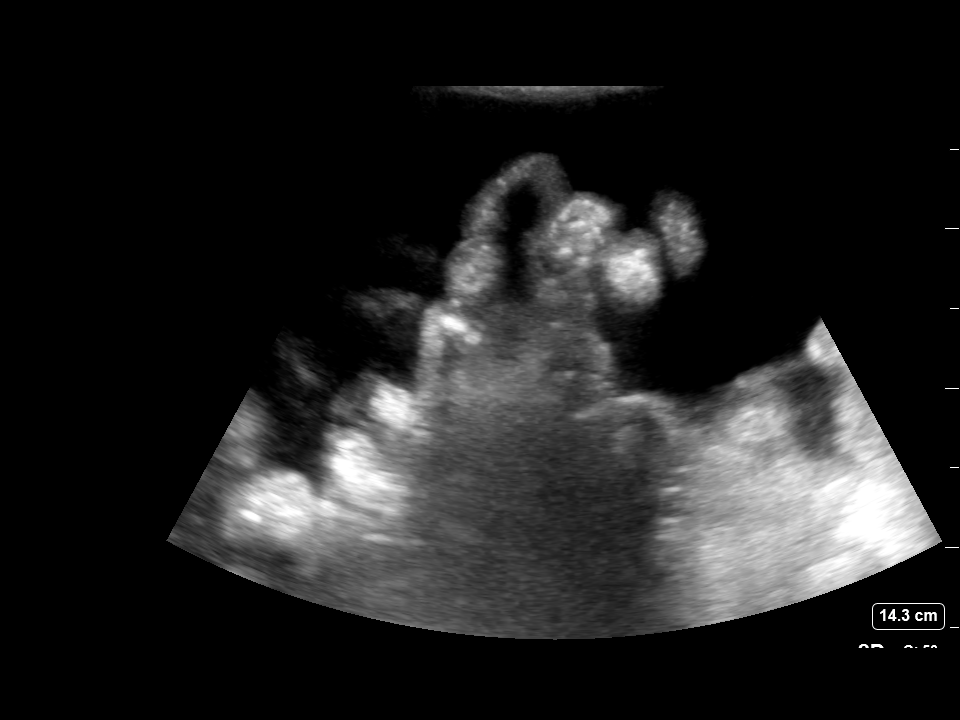
[im 2/5]
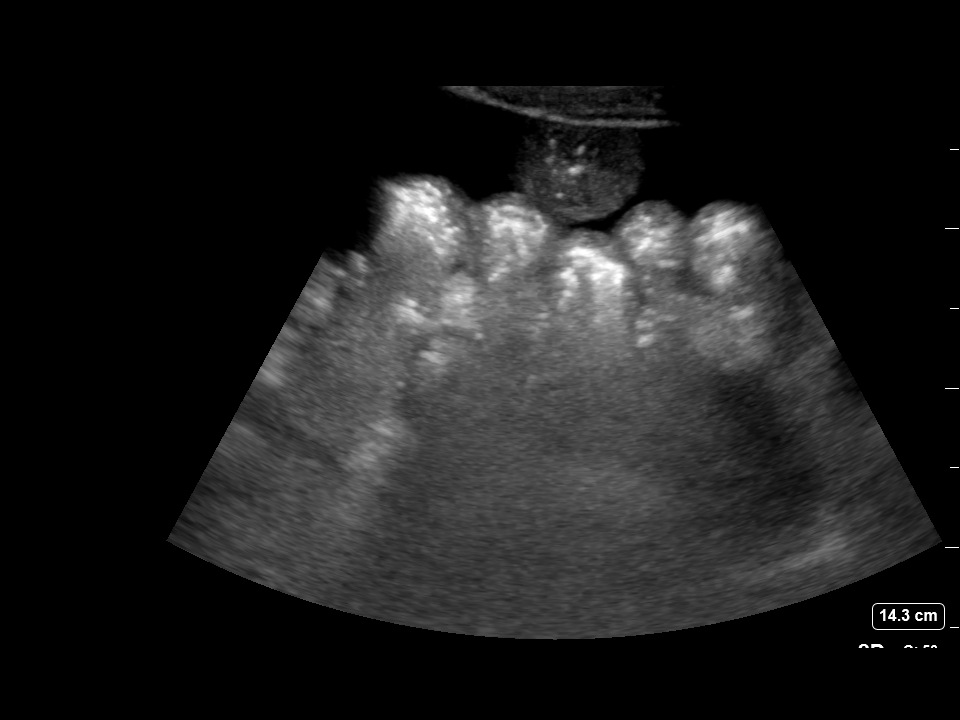
[im 3/5]
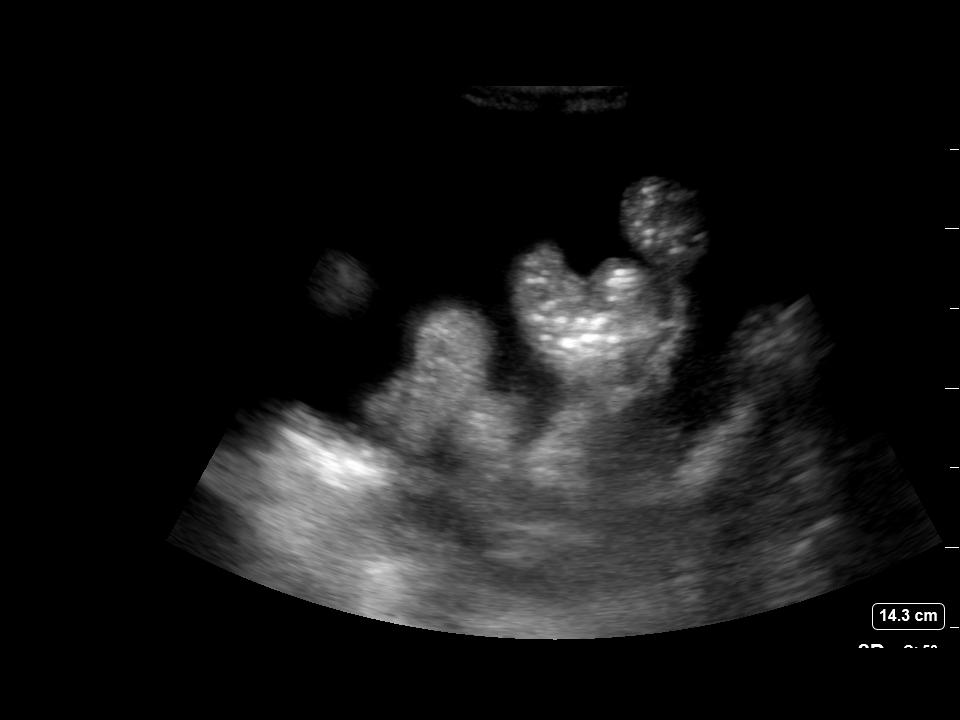
[im 4/5]
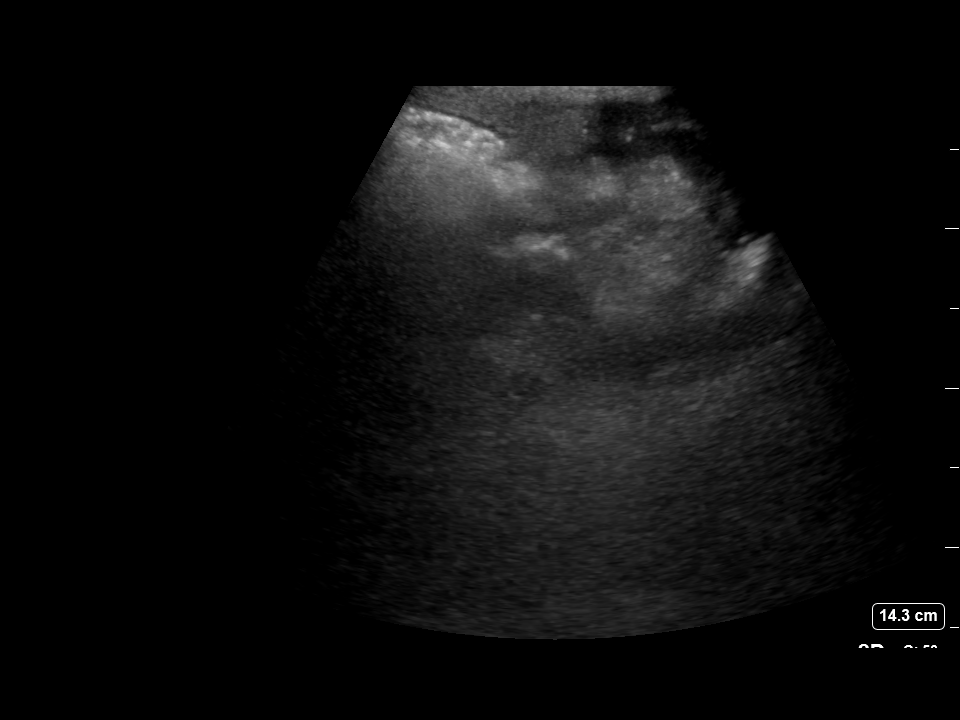
[im 5/5]
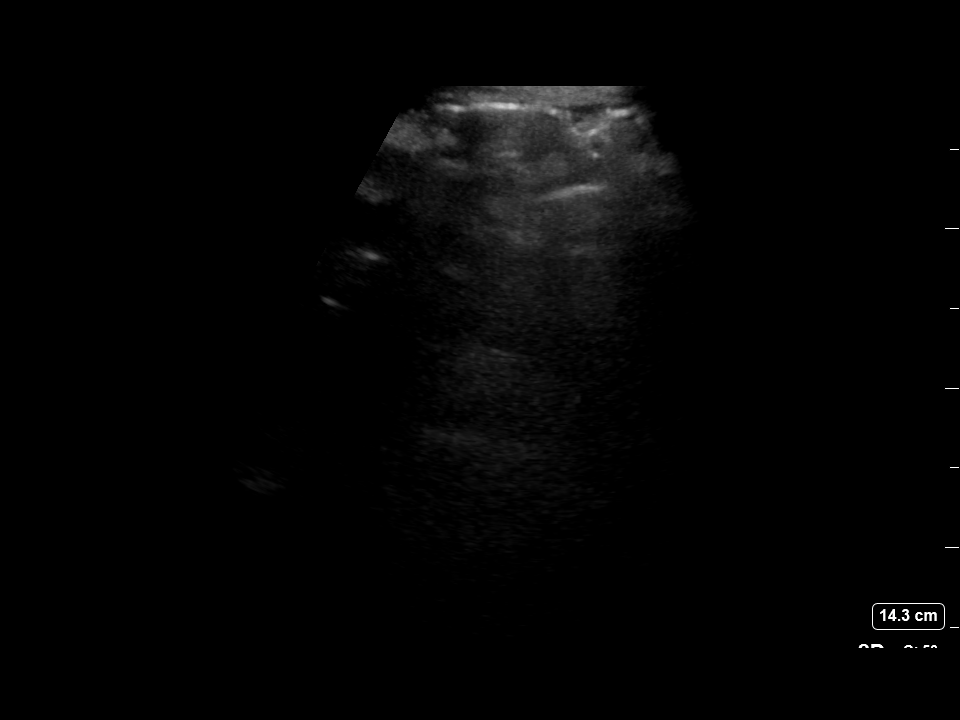

[5 of 5 positions shown; findings below may reference images not displayed]

EXAM:
ULTRASOUND GUIDED LEFT LOWER QUADRANT PARACENTESIS

MEDICATIONS:
None.

COMPLICATIONS:
None immediate.

PROCEDURE:
Informed written consent was obtained from the patient after a
discussion of the risks, benefits and alternatives to treatment. A
timeout was performed prior to the initiation of the procedure.

Initial ultrasound scanning demonstrates a large amount of ascites
within the left lower abdominal quadrant. The left lower abdomen was
prepped and draped in the usual sterile fashion. 1% lidocaine was
used for local anesthesia.

Following this, a 19 gauge, 7-cm, Yueh catheter was introduced. An
ultrasound image was saved for documentation purposes. The
paracentesis was performed. The catheter was removed and a dressing
was applied. The patient tolerated the procedure well without
immediate post procedural complication.
FINDINGS: A total of approximately 3 L of clear yellow fluid was removed.
IMPRESSION: Successful ultrasound-guided paracentesis yielding 3 liters of
peritoneal fluid.

## 2020-02-08 MED ORDER — LIDOCAINE HCL 1 % IJ SOLN
INTRAMUSCULAR | Status: AC
  Filled 2020-02-08: qty 20

## 2020-02-08 MED ORDER — LIDOCAINE HCL (PF) 1 % IJ SOLN
INTRAMUSCULAR | Status: AC | PRN
  Administered 2020-02-08: 10 mL

## 2020-02-08 NOTE — Procedures (Signed)
PROCEDURE SUMMARY:  Successful US guided paracentesis from LLQ.  Yielded 3 L of clear yellow fluid.  No immediate complications.  Pt tolerated well.   Specimen was not sent for labs.  EBL < 1mL  Ascencion Dike [name] 02/08/2020 1:53 PM

## 2020-02-11 ENCOUNTER — Other Ambulatory Visit: Payer: Self-pay | Admitting: Gastroenterology

## 2020-02-13 NOTE — Telephone Encounter (Signed)
Does patient need to continue daily?

## 2020-02-15 ENCOUNTER — Other Ambulatory Visit: Payer: Self-pay

## 2020-02-15 ENCOUNTER — Other Ambulatory Visit: Payer: Self-pay | Admitting: Gastroenterology

## 2020-02-15 ENCOUNTER — Ambulatory Visit (HOSPITAL_COMMUNITY)
Admission: RE | Admit: 2020-02-15 | Discharge: 2020-02-15 | Disposition: A | Discharge status: Home / Self Care | Payer: Medicare Other | Source: Ambulatory Visit | Attending: Gastroenterology | Admitting: Gastroenterology

## 2020-02-15 DIAGNOSIS — N5089 Other specified disorders of the male genital organs: Secondary | ICD-10-CM | POA: Insufficient documentation

## 2020-02-15 DIAGNOSIS — K7031 Alcoholic cirrhosis of liver with ascites: Secondary | ICD-10-CM | POA: Diagnosis present

## 2020-02-15 DIAGNOSIS — B182 Chronic viral hepatitis C: Secondary | ICD-10-CM

## 2020-02-15 IMAGING — US IR ABDOMEN US LIMITED
1 series · 3 of 3 positions shown · non-contrast
Comparison: None.

CLINICAL DATA: 69-year-old male with a history of recurrent ascites

EXAM:
LIMITED ABDOMEN ULTRASOUND FOR ASCITES
TECHNIQUE: Limited ultrasound survey for ascites was performed in all four
abdominal quadrants.

[Series 1: (id) · 3 of 3 slices shown]
[im 1/3]
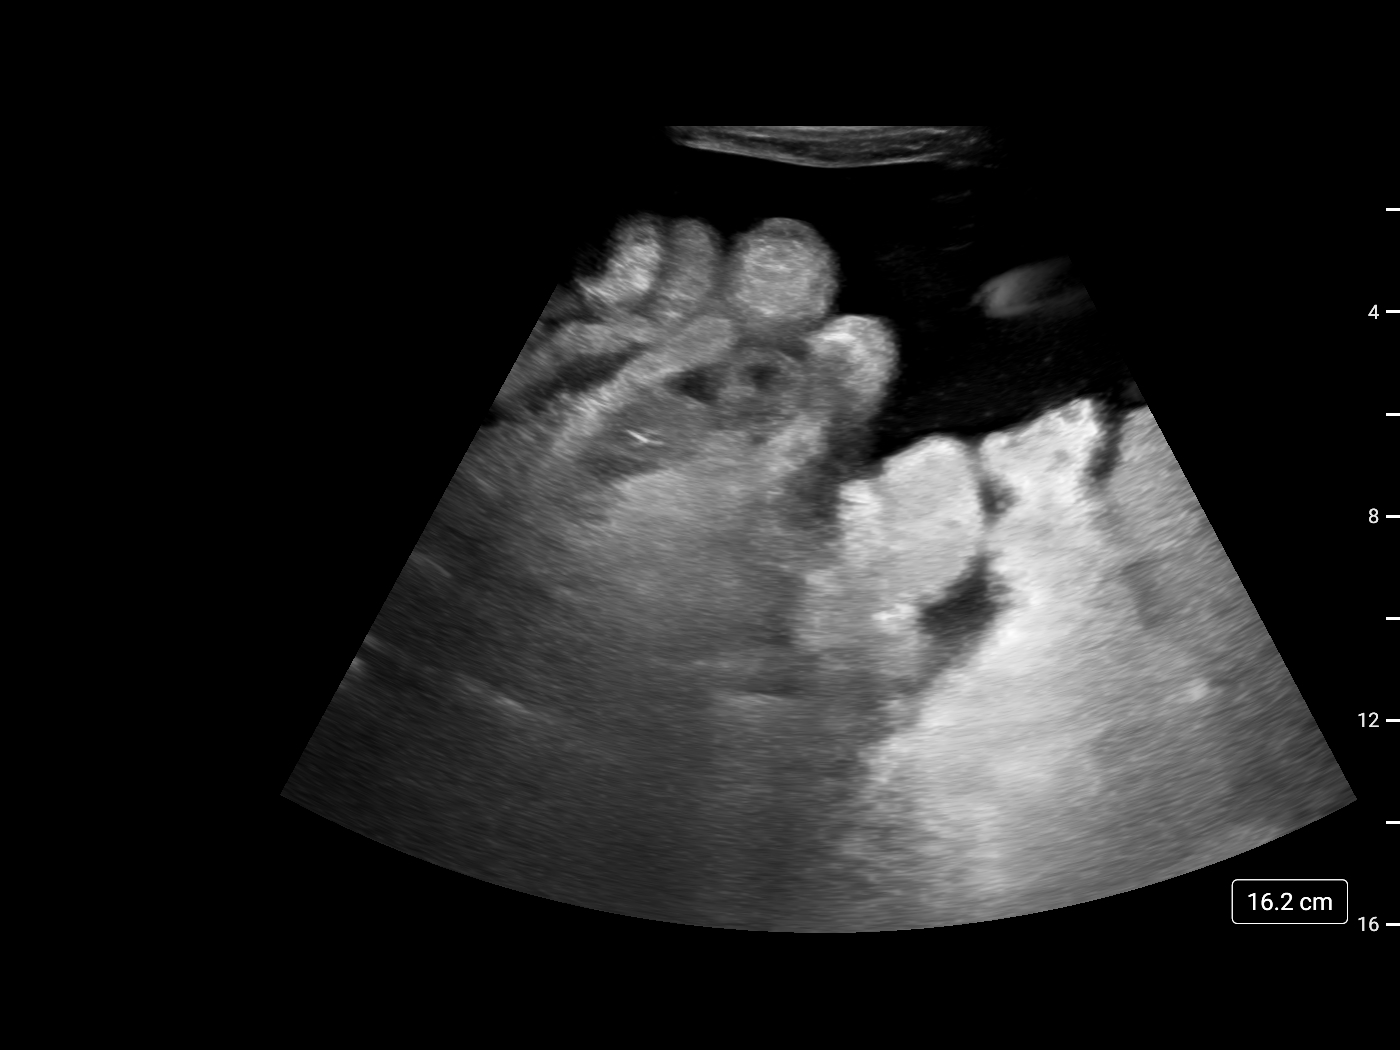
[im 2/3]
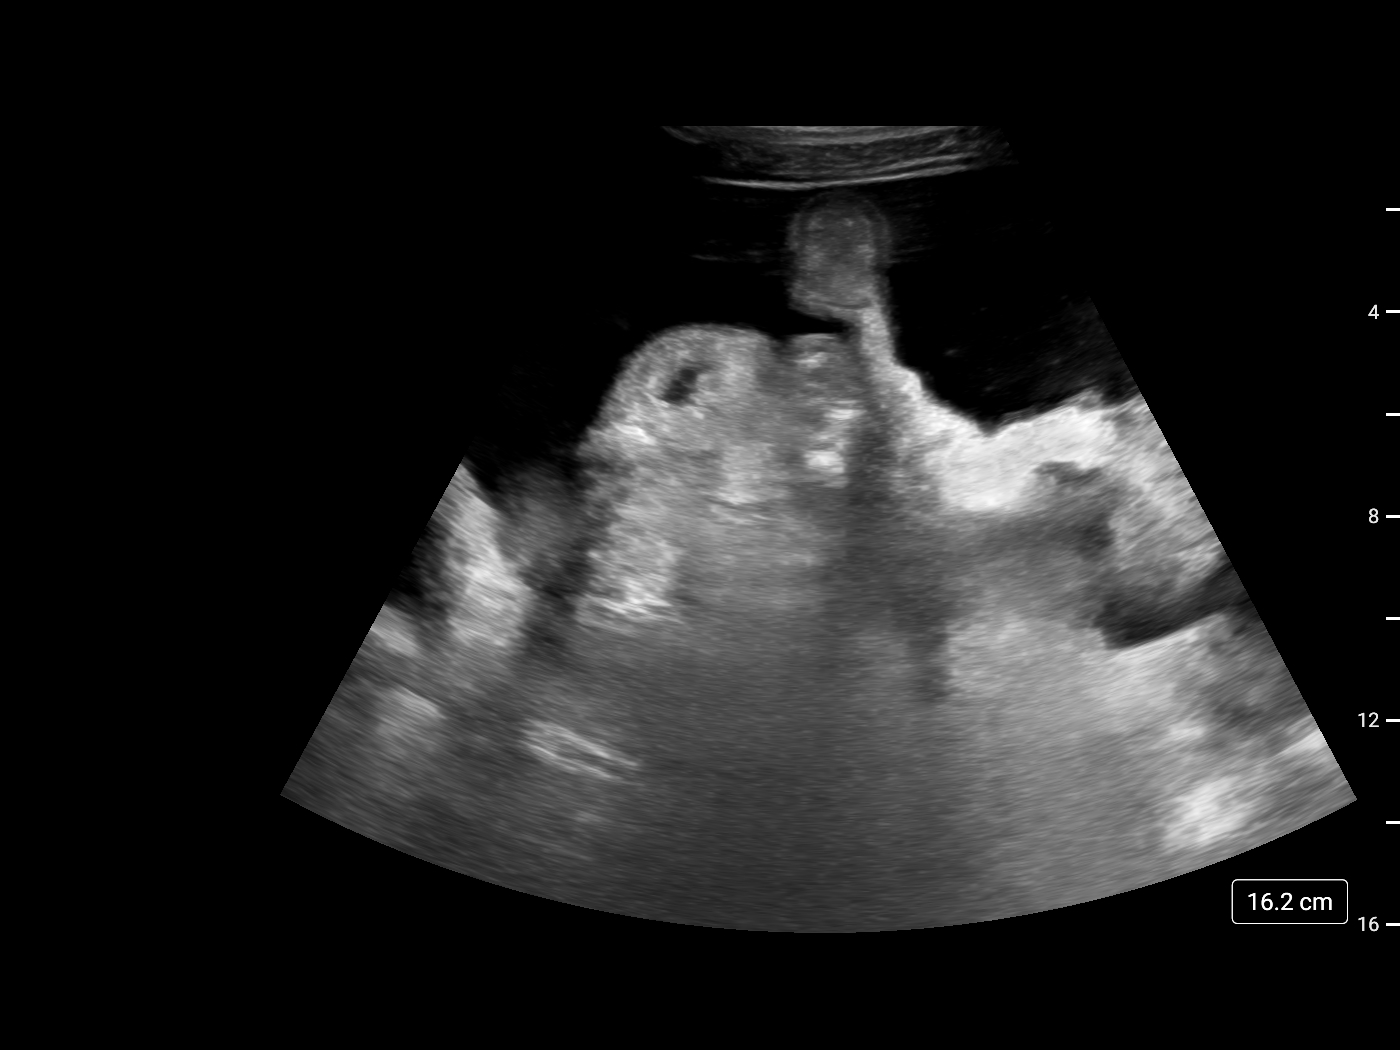
[im 3/3]
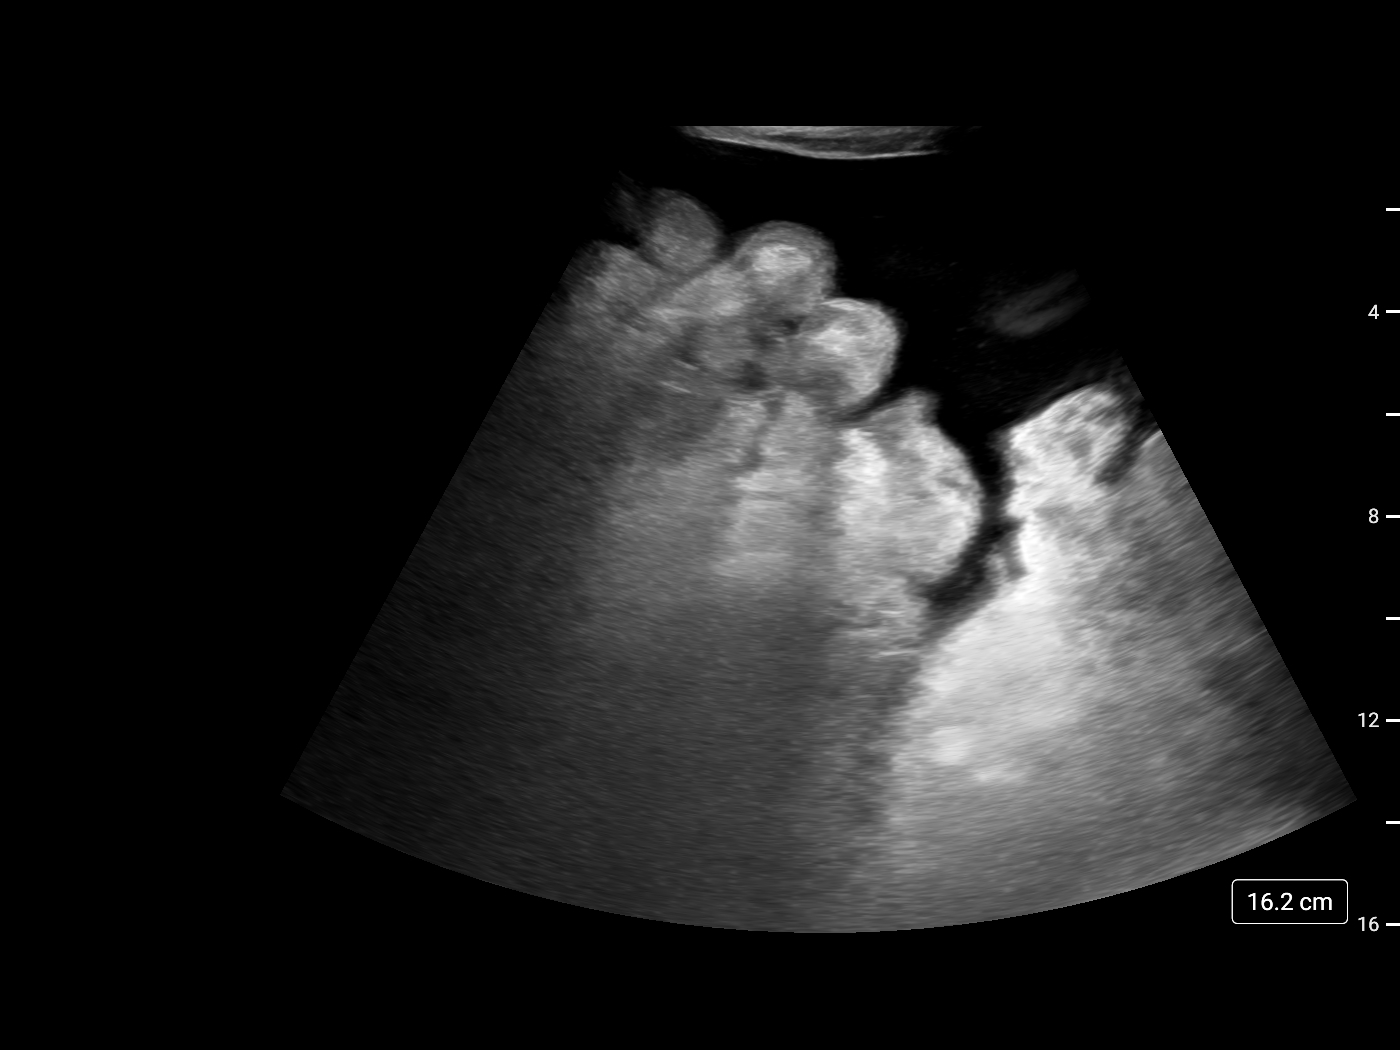

[3 of 3 positions shown; findings below may reference images not displayed]

FINDINGS: Ultrasound survey demonstrates small volume ascites on today's
study. Paracentesis not performed.
IMPRESSION: Small volume ascites with paracentesis not performed at this time.

## 2020-02-15 MED ORDER — LIDOCAINE HCL 1 % IJ SOLN
INTRAMUSCULAR | Status: AC
  Filled 2020-02-15: qty 20

## 2020-02-15 MED ORDER — LIDOCAINE HCL 1 % IJ SOLN: INTRAMUSCULAR | Status: DC | PRN

## 2020-02-15 NOTE — Progress Notes (Signed)
Patient presented to [facility] for scheduled therapeutic paracentesis. Ultrasound imaging showed small fluid windows for procedure on both the left and ride sides of abdomen. After discussion with patient via interpreter, decision was made to not proceed with paracentesis and patient will return for his scheduled appointment next week. Dr. Earleen Newport made aware.  Soyla Dryer, NP

## 2020-02-16 ENCOUNTER — Ambulatory Visit (INDEPENDENT_AMBULATORY_CARE_PROVIDER_SITE_OTHER): Payer: Medicare Other | Admitting: Internal Medicine

## 2020-02-16 ENCOUNTER — Encounter: Payer: Self-pay | Admitting: Internal Medicine

## 2020-02-16 VITALS — BP 109/74 | HR 102 | Temp 97.9°F | Wt 123.0 lb

## 2020-02-16 DIAGNOSIS — K7469 Other cirrhosis of liver: Secondary | ICD-10-CM | POA: Diagnosis present

## 2020-02-16 DIAGNOSIS — R188 Other ascites: Secondary | ICD-10-CM | POA: Diagnosis not present

## 2020-02-16 DIAGNOSIS — B182 Chronic viral hepatitis C: Secondary | ICD-10-CM

## 2020-02-16 NOTE — Assessment & Plan Note (Signed)
Followed by Dr. Loletha Carrow and requiring paracentesis. On diuretics.

## 2020-02-16 NOTE — Assessment & Plan Note (Signed)
Decompensated though MELD of just 10.  However with his significant ascites and other markers, I would like to have him evaluated by hepatology prior to treatment consideration for ? Transplant evaluation/future considerations.  Also ok from my standpoint for hepatology to treat hepatitis C if desired.

## 2020-02-16 NOTE — Assessment & Plan Note (Signed)
Genotype 1a.  I will treat him with 12 weeks of Harvoni + ribavirin after hepatology evaluation (or happy to defer to hepatology for treatment).   Will follow up with him after the appt.

## 2020-02-16 NOTE — Progress Notes (Signed)
   Subjective:    Patient ID: Donald Aguilar, male    DOB: 1950/10/21, 69 y.o.   MRN: [id number]  HPI Here for follow up of chronic hepatitis C, genotype 1a with decompensated cirrhosis.  History of alcohol abuse, sober for 1 year.   He requires regular paracentesis and is followed by Dr. Loletha Carrow of GI.  CT done in November 2020 and no HCC.  Here with an interpreter.  On diuretics from Dr. Loletha Carrow.  Platelets wnl.  MELD score of 10.     Review of Systems  Constitutional: Negative for fatigue.  Gastrointestinal: Negative for diarrhea.       Objective:   Physical Exam Abdominal:     Palpations: Abdomen is soft.     Comments: + ascites  Neurological:     General: No focal deficit present.     Mental Status: He is alert.    SH: no alcohol      Assessment & Plan:

## 2020-02-17 ENCOUNTER — Other Ambulatory Visit: Payer: Self-pay | Admitting: Physician Assistant

## 2020-02-22 ENCOUNTER — Ambulatory Visit (HOSPITAL_COMMUNITY): Admission: RE | Admit: 2020-02-22 | Payer: Medicare Other | Source: Ambulatory Visit

## 2020-02-24 ENCOUNTER — Encounter: Payer: Self-pay | Admitting: Gastroenterology

## 2020-02-24 ENCOUNTER — Telehealth: Payer: Self-pay

## 2020-02-24 ENCOUNTER — Other Ambulatory Visit: Payer: Self-pay

## 2020-02-24 ENCOUNTER — Other Ambulatory Visit (INDEPENDENT_AMBULATORY_CARE_PROVIDER_SITE_OTHER): Payer: Medicare Other

## 2020-02-24 ENCOUNTER — Ambulatory Visit (INDEPENDENT_AMBULATORY_CARE_PROVIDER_SITE_OTHER): Payer: Medicare Other | Admitting: Gastroenterology

## 2020-02-24 ENCOUNTER — Ambulatory Visit (INDEPENDENT_AMBULATORY_CARE_PROVIDER_SITE_OTHER)
Admission: RE | Admit: 2020-02-24 | Discharge: 2020-02-24 | Disposition: A | Discharge status: Home / Self Care | Payer: Medicare Other | Source: Ambulatory Visit | Attending: Gastroenterology | Admitting: Gastroenterology

## 2020-02-24 VITALS — BP 88/58 | HR 67 | Temp 98.5°F | Ht 65.0 in | Wt 128.0 lb

## 2020-02-24 DIAGNOSIS — B182 Chronic viral hepatitis C: Secondary | ICD-10-CM | POA: Diagnosis not present

## 2020-02-24 DIAGNOSIS — N5089 Other specified disorders of the male genital organs: Secondary | ICD-10-CM

## 2020-02-24 DIAGNOSIS — I8510 Secondary esophageal varices without bleeding: Secondary | ICD-10-CM

## 2020-02-24 DIAGNOSIS — K7031 Alcoholic cirrhosis of liver with ascites: Secondary | ICD-10-CM

## 2020-02-24 DIAGNOSIS — R600 Localized edema: Secondary | ICD-10-CM

## 2020-02-24 LAB — BASIC METABOLIC PANEL
BUN: 8 mg/dL (ref 6–23)
CO2: 27 mEq/L (ref 19–32)
Calcium: 8.5 mg/dL (ref 8.4–10.5)
Chloride: 104 mEq/L (ref 96–112)
Creatinine, Ser: 0.75 mg/dL (ref 0.40–1.50)
GFR: 103.81 mL/min (ref >60.00)
Glucose, Bld: 87 mg/dL (ref 70–99)
Potassium: 3.7 mEq/L (ref 3.5–5.1)
Sodium: 134 mEq/L — ABNORMAL LOW (ref 135–145)

## 2020-02-24 IMAGING — DX DG CHEST 2V
2 series · 2 of 2 positions shown · non-contrast
Comparison: Radiographs [DATE]

CLINICAL DATA: Alcoholic cirrhosis with ascites. Preoperative
assessment for paracentesis.

EXAM:
CHEST - 2 VIEW

[chest pa]
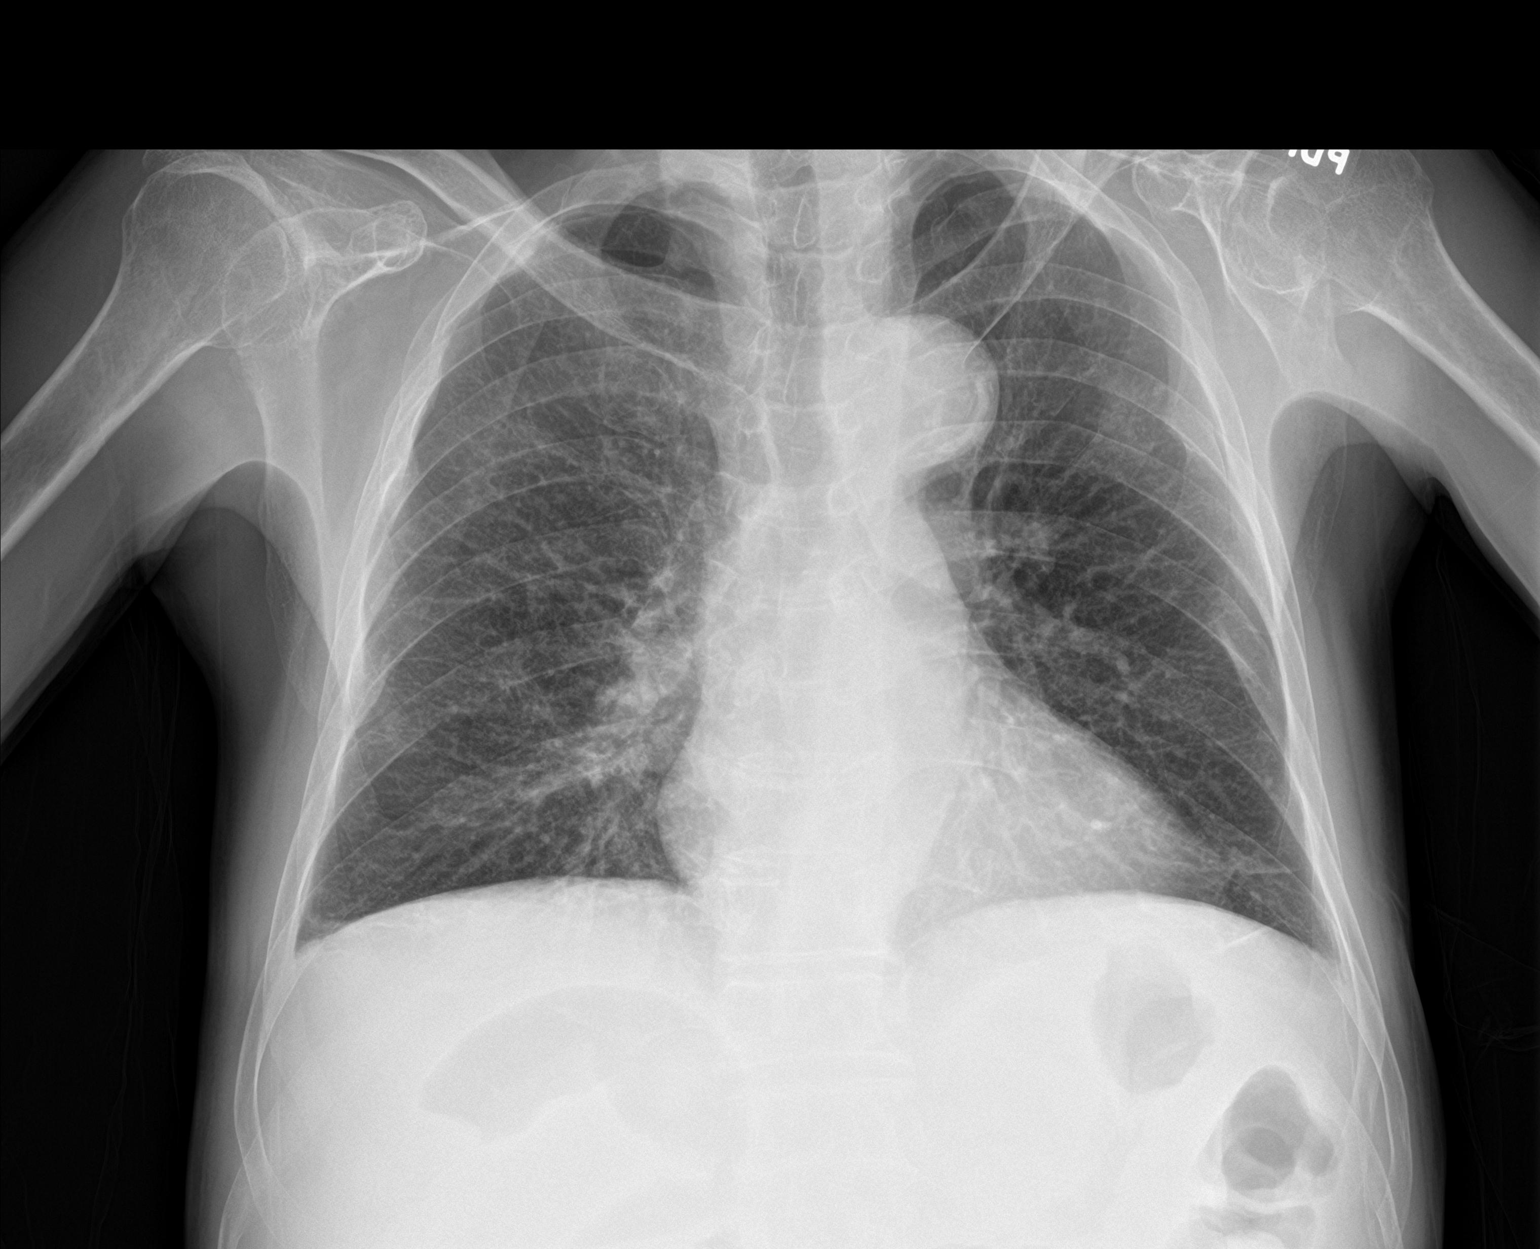

[chest lat]
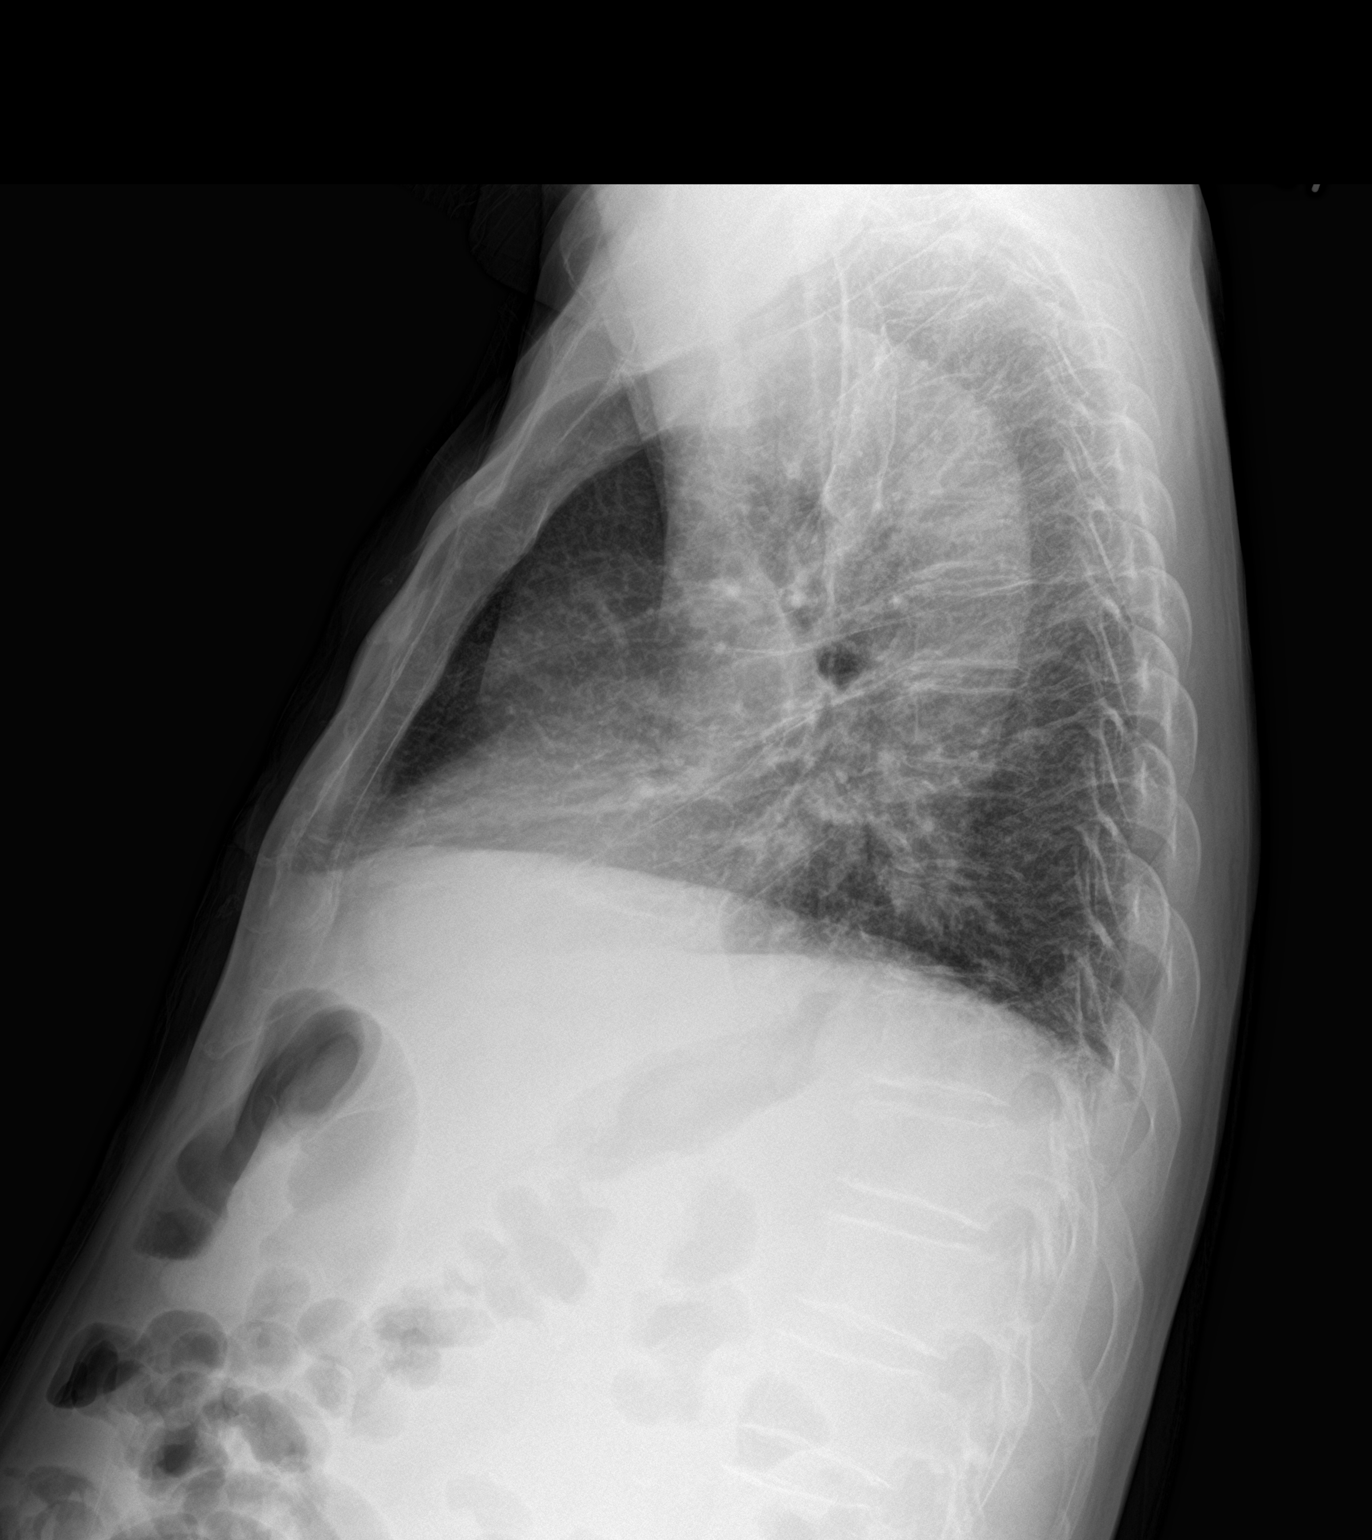

[2 of 2 positions shown; findings below may reference images not displayed]

FINDINGS: The heart size and mediastinal contours are stable. There is aortic
atherosclerosis. Interval improved aeration of the lung bases which
are now clear. No significant pleural effusion or pneumothorax. Old
left clavicle and bilateral rib fractures.
IMPRESSION: No active cardiopulmonary process.

## 2020-02-24 MED ORDER — SPIRONOLACTONE 50 MG PO TABS: 50.0000 mg | ORAL_TABLET | Freq: Every day | ORAL | 3 refills | Status: DC

## 2020-02-24 MED ORDER — FUROSEMIDE 40 MG PO TABS: 40.0000 mg | ORAL_TABLET | Freq: Every day | ORAL | 3 refills | Status: DC

## 2020-02-24 NOTE — Patient Instructions (Addendum)
If you are age 67 or older, your body mass index should be between 23-30. Your Body mass index is 21.3 kg/m. If this is out of the aforementioned range listed, please consider follow up with your Primary Care Provider.  If you are age 63 or younger, your body mass index should be between 19-25. Your Body mass index is 21.3 kg/m. If this is out of the aformentioned range listed, please consider follow up with your Primary Care Provider.   Your provider has requested that you go to the basement level for lab work before leaving today. Press "B" on the elevator. The lab is located at the first door on the left as you exit the elevator.  Due to recent changes in healthcare laws, you may see the results of your imaging and laboratory studies on MyChart before your provider has had a chance to review them.  We understand that in some cases there may be results that are confusing or concerning to you. Not all laboratory results come back in the same time frame and the provider may be waiting for multiple results in order to interpret others.  Please give [place] 48 hours in order for your provider to thoroughly review all the results before contacting the office for clarification of your results.   Go to basement for a chest xray.  You have been scheduled for an abdominal paracentesis at [facility] radiology (1st floor of hospital) on 02-28-2020 at  [address]. Please arrive at least 15 minutes prior to your appointment time for registration. Should you need to reschedule this appointment for any reason, please call our office at [phone].  We scheduled the CTA for 03-02-2020.   Cardiology will be in touch for the 2-D echocardiogram.    It was a pleasure to see you today!  Dr. Loletha Carrow

## 2020-02-24 NOTE — Progress Notes (Signed)
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  Large volume ascites and severe scrotal edema, requiring frequent paracentesis.  Relative hypotension limiting ability to increase diuretics.  Challenging social situation with limited resources and health literacy.  Had ID eval yesterday - [facility] would like to defer Rx until hepatology evaluation and possible transplant consideration and possibly have hepatology Rx the HCV (genotype 1a).  This gentleman was seen with the interpreter for the entire visit (except when she stepped out of the room for physical exam).  He again complains of abdominal bloating and discomfort from the ascites.  He also has large volume scrotum edema which is very uncomfortable.  It does not seem he was able to get a support device for that, or perhaps still did not understand where to go or how to get it.  It sounds like he is of quite limited resources.  His only family is a niece here in town, he reportedly has no spouse, siblings or children.  He has 3 roommates, and unfortunately one of them moved his medicines within the last few days he has been unable to find them and take them.  He is happy to say that he walks around the neighborhood as much as he can.  It does not sound like he understands the concept of sodium restriction.  He has cut down smoking, but still having 4 to 5 cigarettes a day.  He has some dyspnea with exertion, no PND.  He denies chest pain or dysuria.  After most recent office note and lab results, I discussed his case with one of our interventional radiology physicians about the possibility of evaluation for TIPS.  ROS: He does not get lightheaded with standing Remainder of systems negative except as  above  The patient's Past Medical, Family and Social History were reviewed and are on file in the EMR.  Objective:  Med list reviewed  Current Outpatient Medications:  .  diphenhydrAMINE (BENADRYL) 25 MG tablet, Take 25 mg by mouth every 6 (six) hours as needed., Disp: , Rfl:  .  furosemide (LASIX) 40 MG tablet, Take 1 tablet (40 mg total) by mouth daily., Disp: 30 tablet, Rfl: 3 .  ibuprofen (ADVIL) 200 MG tablet, Take 200 mg by mouth every 6 (six) hours as needed., Disp: , Rfl:  .  midodrine (PROAMATINE) 5 MG tablet, Take 1 tablet (5 mg total) by mouth 3 (three) times daily with meals., Disp: 90 tablet, Rfl: 1 .  pantoprazole (PROTONIX) 40 MG tablet, Take 1 tablet by mouth twice daily, Disp: 60 tablet, Rfl: 3 .  potassium chloride SA (KLOR-CON) 20 MEQ tablet, Take 2 tablets by mouth once daily, Disp: 60 tablet, Rfl: 1 .  spironolactone (ALDACTONE) 50 MG tablet, Take 1 tablet (50 mg total) by mouth daily., Disp: 30 tablet, Rfl: 3    Vital signs in last 24 hrs: Vitals:   02/24/20 0829  BP: (!) 88/58  Pulse: 67  Temp: 98.5 F (36.9 C)   I took his blood pressure manually and got 95/65 (and this cuff is too large for his arm)  Physical Exam  Chronically ill-appearing man with generalized poor muscle mass.  HEENT: sclera anicteric, oral mucosa moist without lesions  Neck: supple, no thyromegaly, JVD or lymphadenopathy  Cardiac: RRR without  murmurs, S1S2 heard, no peripheral edema  Pulm: Crackles right lower lung field, possibly dull to percussion there as well., normal RR and effort noted  Abdomen: soft, mild lower tenderness, with active bowel sounds.  Protuberant abdomen with ascites, left lobe liver enlarged, no spleen tip palpable  Skin; warm and dry, no jaundice or rash.  He has ecchymosis and spider nevi.  Neuro: Steady gait, the interpreter says he has fluent speech and normal mentation.  He gets on exam table slowly but without assistance, no  asterixis.  Labs:  CMP Latest Ref Rng & Units 01/30/2020 12/14/2019 11/08/2019  Glucose 70 - 99 mg/dL 96 93 101(H)  BUN 6 - 23 mg/dL 10 11 13   Creatinine 0.40 - 1.50 mg/dL 0.69 0.77 0.85  Sodium 135 - 145 mEq/L 135 133(L) 133(L)  Potassium 3.5 - 5.1 mEq/L 3.8 3.1(L) 3.4(L)  Chloride 96 - 112 mEq/L 104 97 96  CO2 19 - 32 mEq/L 26 29 31   Calcium 8.4 - 10.5 mg/dL 8.4 9.1 8.6  Total Protein 6.0 - 8.3 g/dL 7.8 - -  Total Bilirubin 0.2 - 1.2 mg/dL 1.5(H) - -  Alkaline Phos 39 - 117 U/L 109 - -  AST 0 - 37 U/L 48(H) - -  ALT 0 - 53 U/L 24 - -   Lab Results  Component Value Date   INR 1.2 (H) 01/30/2020   INR 1.6 (H) 11/02/2019   INR 1.5 (H) 09/28/2019   CBC Latest Ref Rng & Units 01/30/2020 11/02/2019 10/12/2019  WBC 4.0 - 10.5 K/uL 7.2 6.1 7.2  Hemoglobin 13.0 - 17.0 g/dL 12.1(L) 12.0(L) 11.6(L)  Hematocrit 39.0 - 52.0 % 35.2(L) 35.6(L) 35.7(L)  Platelets 150.0 - 400.0 K/uL 266.0 226 190   Last AFP 3.3 on 09/10/2019  Meld score 10 ___________________________________________ Radiologic studies:  Paracentesis on March 12, March 19, March 29, April 14, April 21 On April 28, insufficient fluid for paracentesis   ____________________________________________ Other:   _____________________________________________ Assessment & Plan  Assessment: Encounter Diagnoses  Name Primary?  . Alcoholic cirrhosis of liver with ascites ([place]) Yes  . Chronic hepatitis C without hepatic coma ([place])   . Scrotal edema   . Secondary esophageal varices without bleeding ([facility])     Complex cirrhosis and ascites, made challenging by poor health literacy and poor social support.  He generally has been compliant with visits with the exception of missing a paracentesis visit last week.  He is miserable from this ascites and scrotal edema, and I think he needs a more durable solution.  I have had my concerns about the possibility of hepatic encephalopathy after TIPS placement, given his limited social  support.  However, in the balance of things, I believe his long-term risk is greater from indefinite large-volume paracentesis.  Even with successful treatment of the hepatitis C, I do not see his liver function improving enough that his volume status will significantly improve.  I recently sent a message to the congregational nurse about this patient after speaking with this interpreter who knows him well.  We are hoping to coordinate a home assessment to support him.  Plan: BMP and alpha-fetoprotein today CTA BRTO protocol scheduled today 2-D echocardiogram- -furl sent to cardiology to schedule PA/lateral chest x-ray today to evaluate for pulmonary edema or hepatic hydrothorax Therapeutic paracentesis next week, maximum 5 L fluid removal, 25 g albumin given as well. I refilled his spironolactone 50 mg daily and Lasix 40 mg daily.  I do not want to increase the dose  given his relative hypotension, and hope to preserve his renal function.  After those results, if he appears to be a reasonably good TIPS candidate, referral will be placed to the interventional radiology clinic.   50 minutes were spent on this encounter (including chart review, history/exam, counseling/coordination of care, and documentation)   Nelida Meuse III

## 2020-02-24 NOTE — Telephone Encounter (Signed)
amb referral sent for 2- D echo. Butch Penny in ultrasound helped me to schedule

## 2020-02-28 ENCOUNTER — Ambulatory Visit (HOSPITAL_COMMUNITY)
Admission: RE | Admit: 2020-02-28 | Discharge: 2020-02-28 | Disposition: A | Discharge status: Home / Self Care | Payer: Medicare Other | Source: Ambulatory Visit | Attending: Gastroenterology | Admitting: Gastroenterology

## 2020-02-28 ENCOUNTER — Other Ambulatory Visit: Payer: Self-pay

## 2020-02-28 DIAGNOSIS — K7031 Alcoholic cirrhosis of liver with ascites: Secondary | ICD-10-CM

## 2020-02-28 DIAGNOSIS — N5089 Other specified disorders of the male genital organs: Secondary | ICD-10-CM | POA: Diagnosis not present

## 2020-02-28 DIAGNOSIS — I8510 Secondary esophageal varices without bleeding: Secondary | ICD-10-CM | POA: Diagnosis not present

## 2020-02-28 DIAGNOSIS — B182 Chronic viral hepatitis C: Secondary | ICD-10-CM | POA: Insufficient documentation

## 2020-02-28 HISTORY — PX: IR PARACENTESIS: IMG2679

## 2020-02-28 LAB — UNLABELED

## 2020-02-28 IMAGING — US IR PARACENTESIS
1 series · 4 of 4 positions shown · non-contrast
Comparison: none

INDICATION: Patient with history of alcoholic cirrhosis with recurrent ascites.
Request is made for therapeutic paracentesis up to 5 L.

[Series 1: ir (id) (id)/(id)/(id) ir · 4 of 4 slices shown]
[im 1/4]
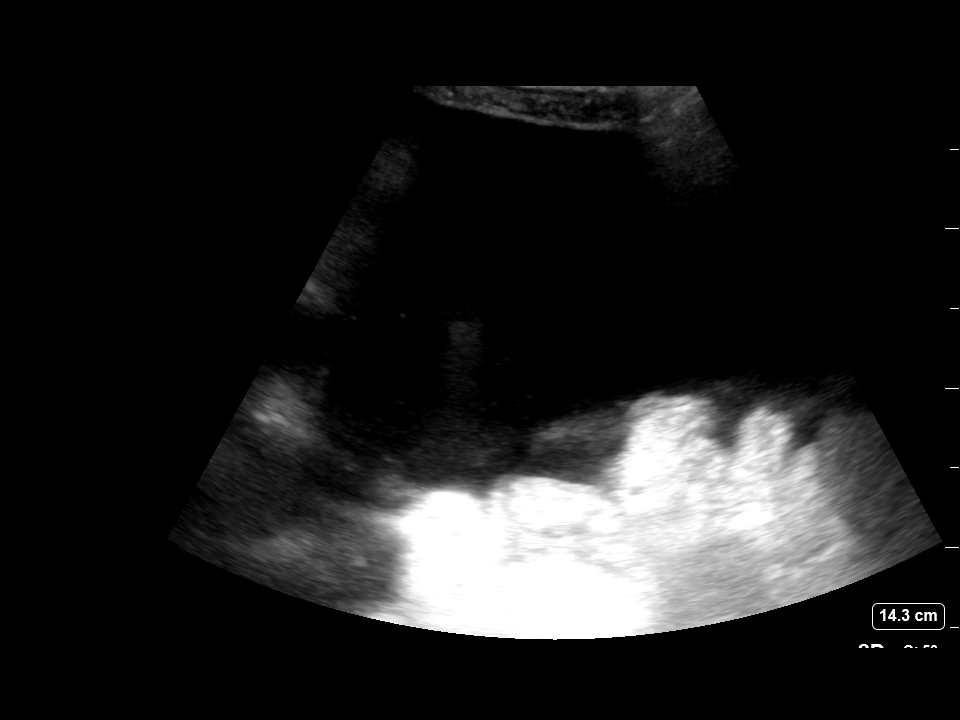
[im 2/4]
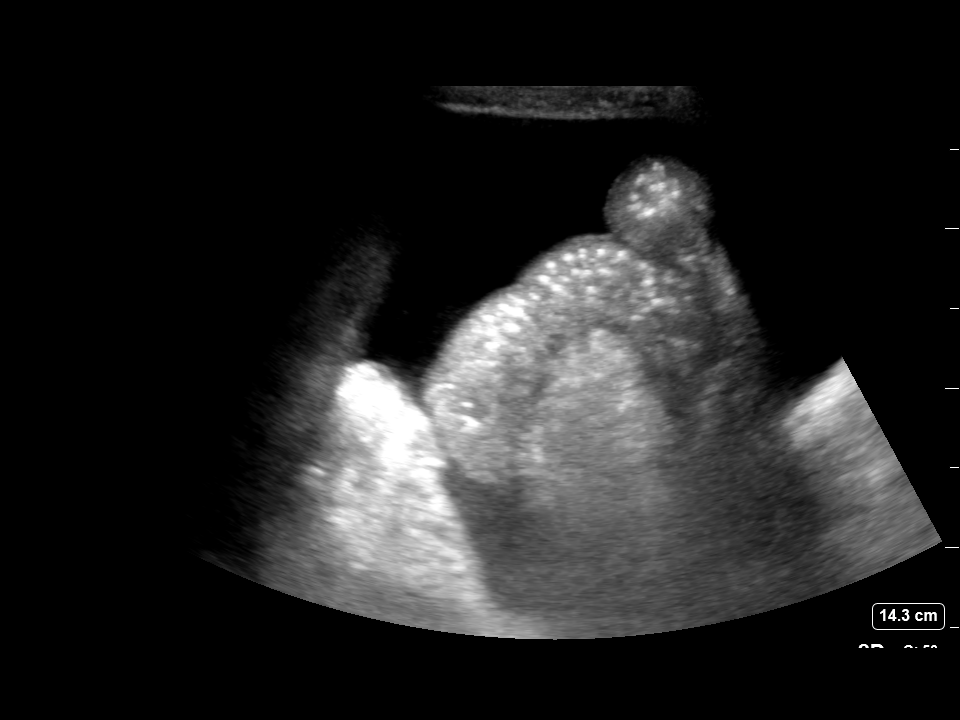
[im 3/4]
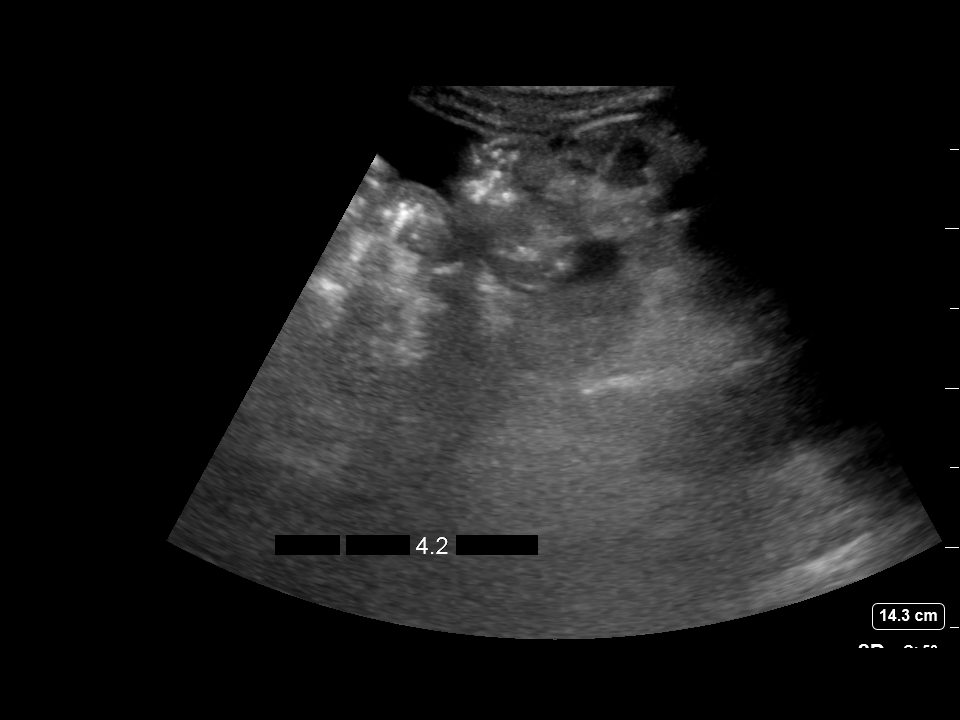
[im 4/4]
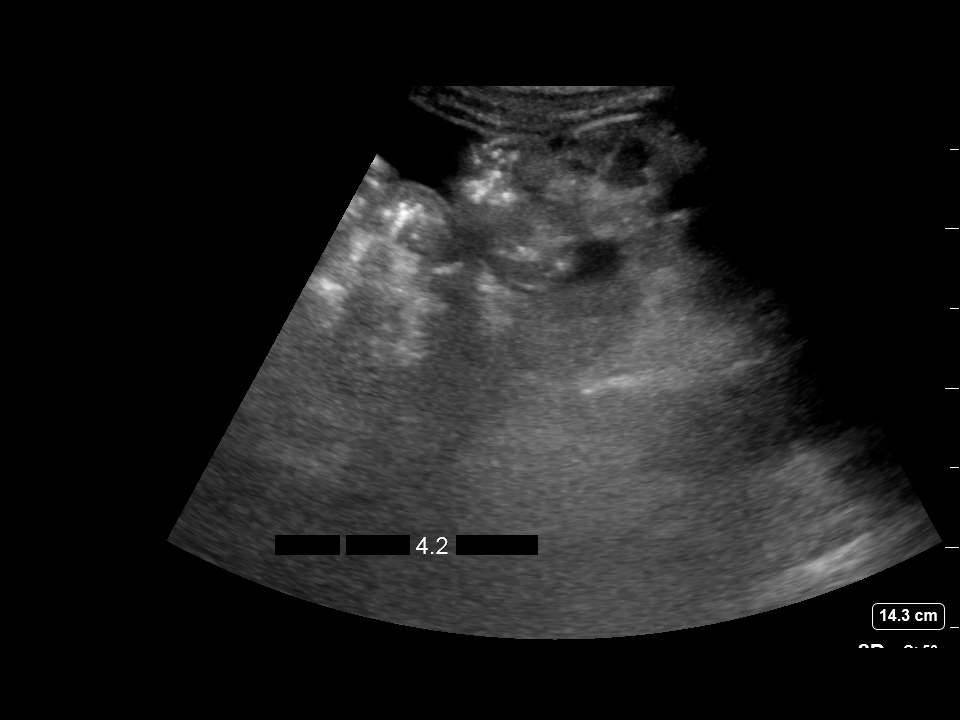

[4 of 4 positions shown; findings below may reference images not displayed]

EXAM:
ULTRASOUND GUIDED THERAPEUTIC PARACENTESIS

MEDICATIONS:
10 mL 1% lidocaine

COMPLICATIONS:
None immediate.

PROCEDURE:
Informed written consent was obtained from the patient after a
discussion of the risks, benefits and alternatives to treatment. A
timeout was performed prior to the initiation of the procedure.

Initial ultrasound scanning demonstrates a large amount of ascites
within the right lower abdominal quadrant. The right lower abdomen
was prepped and draped in the usual sterile fashion. 1% lidocaine
was used for local anesthesia.

Following this, a 19 gauge, 7-cm, Yueh catheter was introduced. An
ultrasound image was saved for documentation purposes. The
paracentesis was performed. The catheter was removed and a dressing
was applied. The patient tolerated the procedure well without
immediate post procedural complication.
FINDINGS: A total of approximately 4.2 L of clear yellow fluid was removed.
IMPRESSION: Successful ultrasound-guided paracentesis yielding 4.2 L of
peritoneal fluid.

## 2020-02-28 MED ORDER — LIDOCAINE HCL 1 % IJ SOLN
INTRAMUSCULAR | Status: DC | PRN
  Administered 2020-02-28: 10 mL

## 2020-02-28 MED ORDER — LIDOCAINE HCL 1 % IJ SOLN
INTRAMUSCULAR | Status: AC
  Filled 2020-02-28: qty 20

## 2020-02-28 NOTE — Procedures (Signed)
PROCEDURE SUMMARY:  Successful image-guided paracentesis from the right lateral abdomen.  Yielded 4.2 liters of clear yellow fluid.  No immediate complications.  EBL < 2 mL. Patient tolerated well.   Specimen was not sent for labs.  Please see imaging section of Epic for full dictation.   Claris Pong Carnetta [place] [name] 02/28/2020 9:21 AM

## 2020-02-29 LAB — EXTRA SPECIMEN

## 2020-02-29 LAB — PAT ID TIQ DOC: Test Affected: 237

## 2020-03-02 ENCOUNTER — Ambulatory Visit (HOSPITAL_COMMUNITY)
Admission: RE | Admit: 2020-03-02 | Discharge: 2020-03-02 | Disposition: A | Discharge status: Home / Self Care | Payer: Medicare Other | Source: Ambulatory Visit | Attending: Gastroenterology | Admitting: Gastroenterology

## 2020-03-02 ENCOUNTER — Other Ambulatory Visit: Payer: Self-pay

## 2020-03-02 DIAGNOSIS — K7031 Alcoholic cirrhosis of liver with ascites: Secondary | ICD-10-CM | POA: Insufficient documentation

## 2020-03-02 DIAGNOSIS — N5089 Other specified disorders of the male genital organs: Secondary | ICD-10-CM | POA: Insufficient documentation

## 2020-03-02 DIAGNOSIS — B182 Chronic viral hepatitis C: Secondary | ICD-10-CM | POA: Diagnosis present

## 2020-03-02 DIAGNOSIS — I8510 Secondary esophageal varices without bleeding: Secondary | ICD-10-CM | POA: Insufficient documentation

## 2020-03-02 IMAGING — CT CT ANGIO ABDOMEN
2 of 9 series · 11 of 46 positions shown, 16 images · IV contrast (APPLIED)
Comparison: [DATE]

CLINICAL DATA: 69-year-old male with a history of recurrent
ascites, portal hypertension, varices

EXAM:
CT ANGIOGRAPHY ABDOMEN
TECHNIQUE: Multidetector CT imaging of the abdomen was performed using the
standard protocol during bolus administration of intravenous
contrast. Multiplanar reconstructed images and MIPs were obtained
and reviewed to evaluate the vascular anatomy.
CONTRAST:  100mL OMNIPAQUE IOHEXOL 350 MG/ML SOLN

[Series 7: venous cor · coronal · portal-venous · 0.71mm/px · 3 of 162 slices shown]
[im 33/162  soft-tissue]
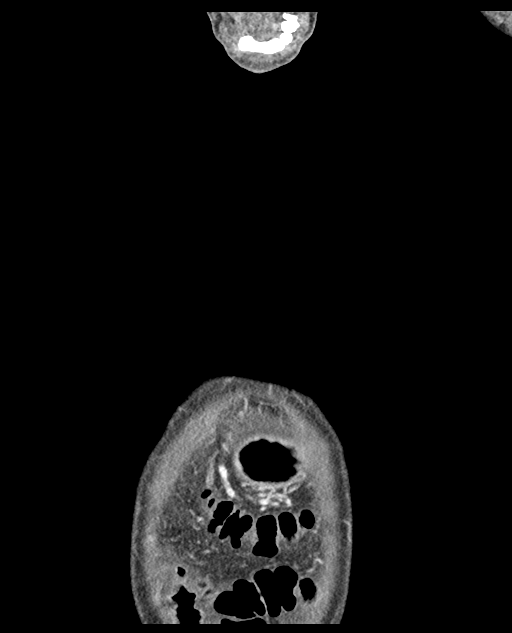
[im 65/162  soft-tissue]
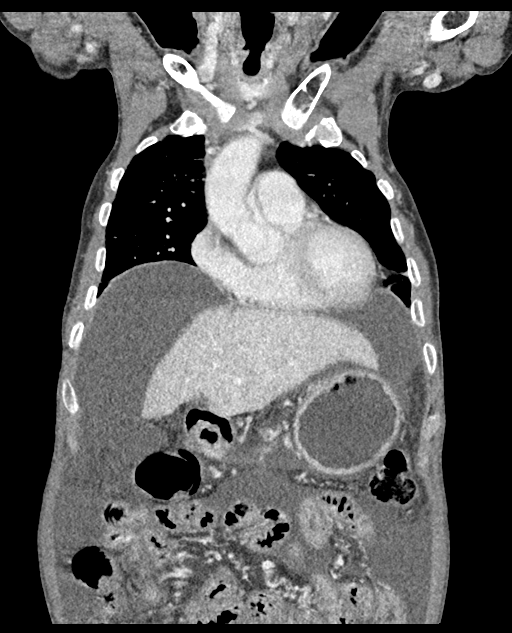
[im 97/162  soft-tissue]
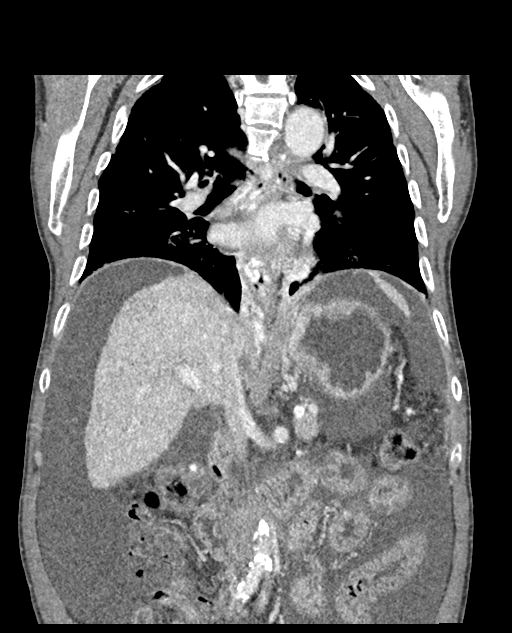

[Series 10: venous thins · axial · portal-venous · 0.61mm/px · z∈[+1041,+1259]mm · 8 of 141 slices shown, 13 images]
[im 16/141  soft-tissue]
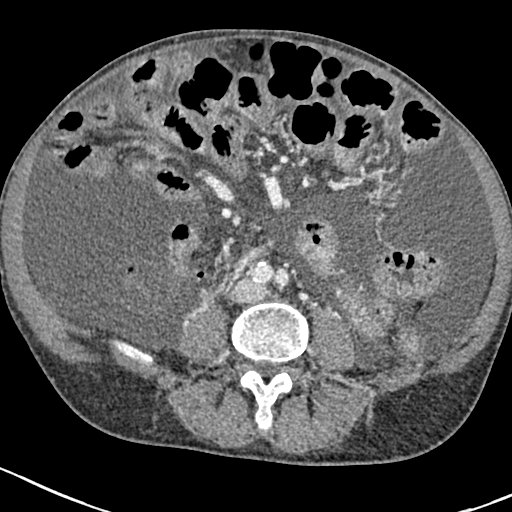
[im 16/141  bone]
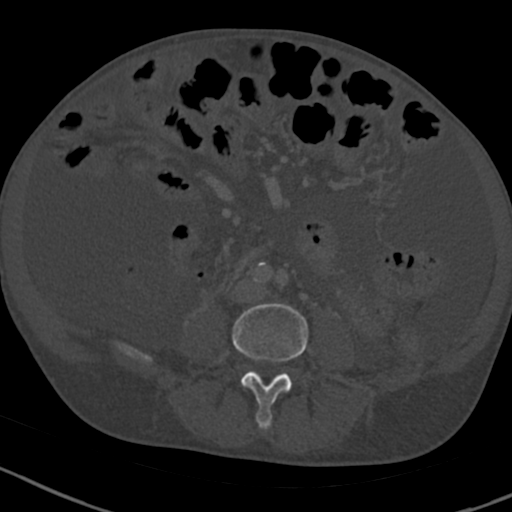
[im 32/141  soft-tissue]
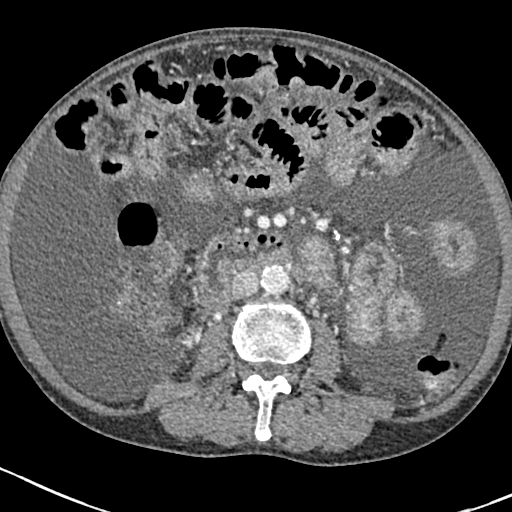
[im 47/141  soft-tissue]
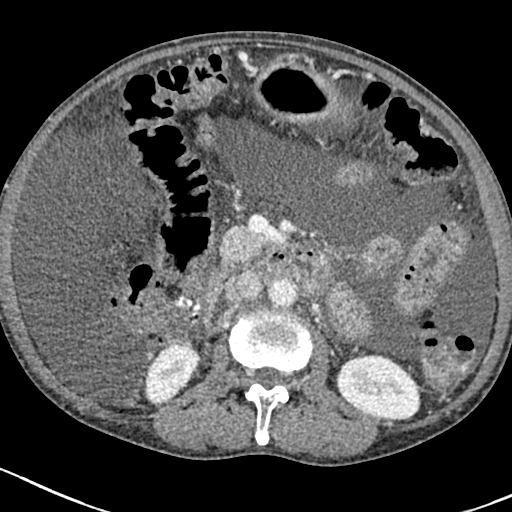
[im 63/141  soft-tissue]
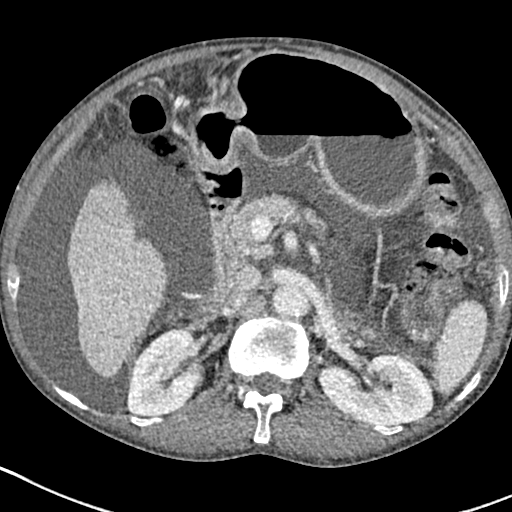
[im 78/141  soft-tissue]
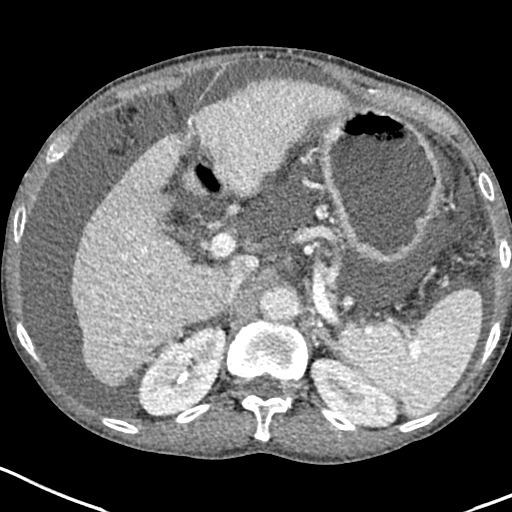
[im 78/141  lung]
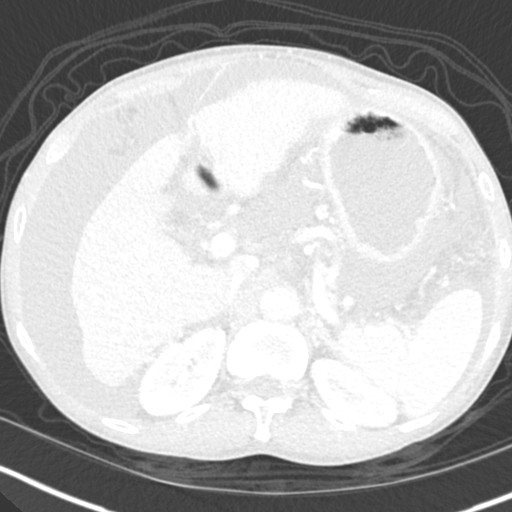
[im 94/141  soft-tissue]
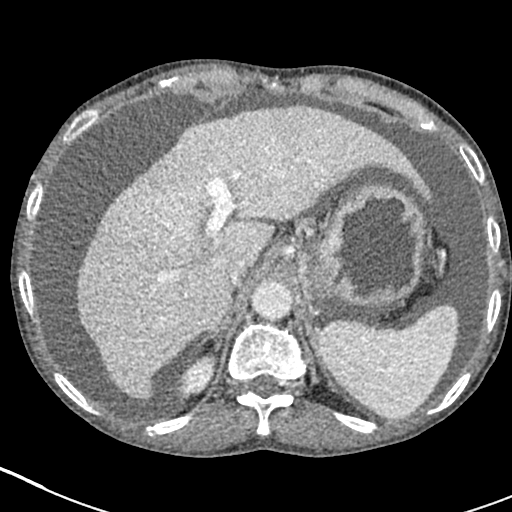
[im 94/141  lung]
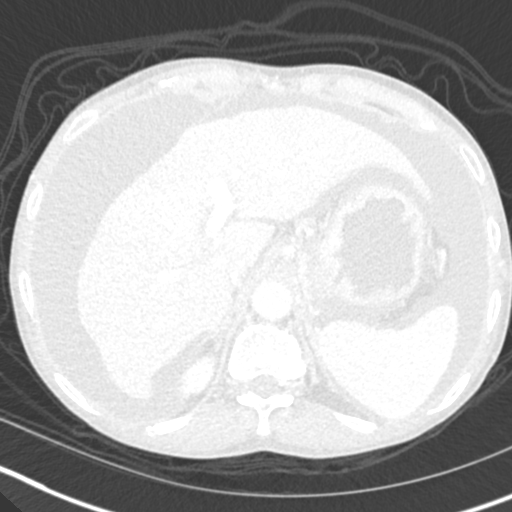
[im 109/141  soft-tissue]
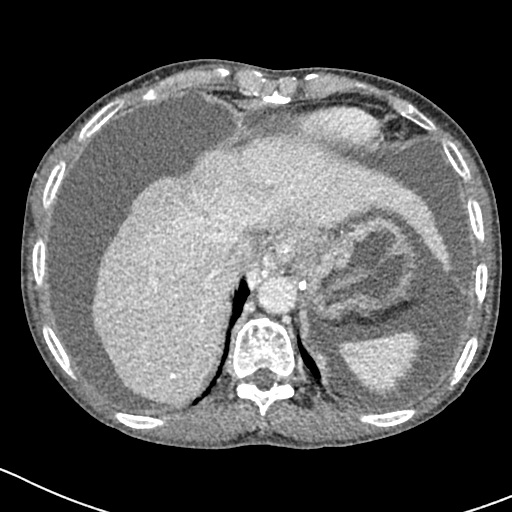
[im 109/141  lung]
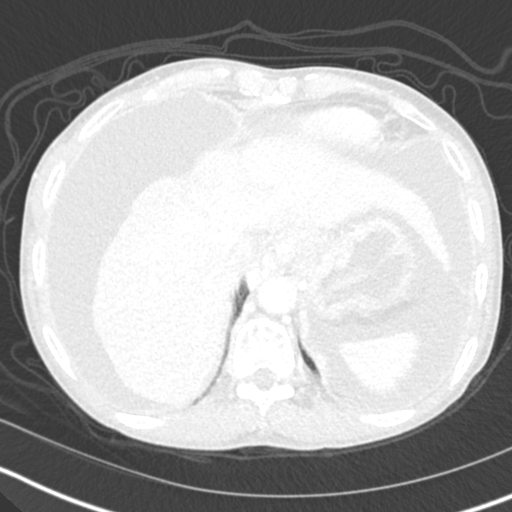
[im 125/141  soft-tissue]
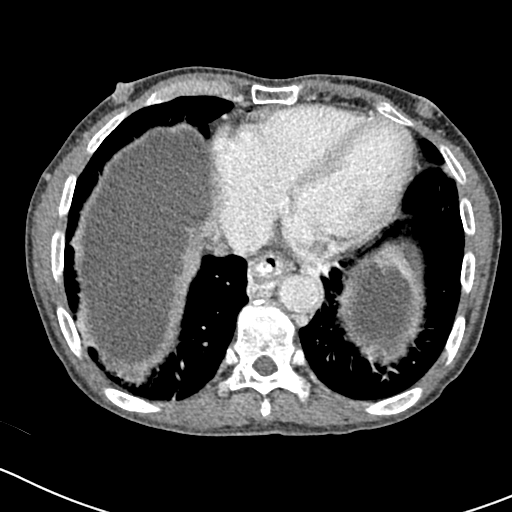
[im 125/141  lung]
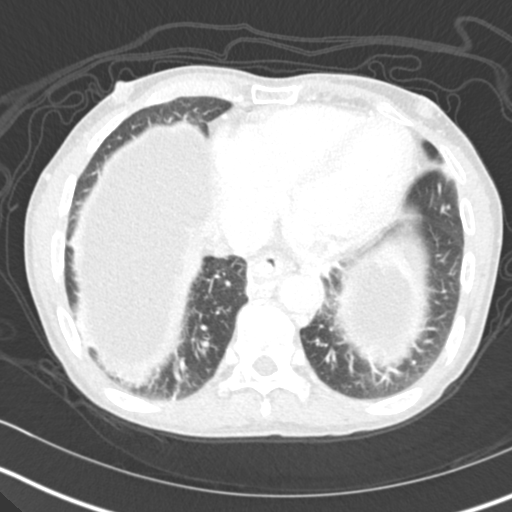

[11 of 46 positions shown; findings below may reference images not displayed]

FINDINGS: VASCULAR

Aorta: Unremarkable course, caliber, contour of the abdominal aorta.
No dissection, aneurysm, or periaortic fluid. Minimal
atherosclerosis of the visualized aorta.

Celiac: Celicomesenteric trunk, with mild atherosclerosis.

SMA: Celicomesenteric trunk, with mild atherosclerosis. Visualized
mesenteric branches are patent.

Renals: Single right renal artery visualized. Minimal
atherosclerosis.

Single left renal artery visualized. No significant atherosclerotic
changes.

IMA: IMA origin is visualized and patent.

Right lower extremity:

Proximal right iliac artery patent.  Mild atherosclerosis.

Left lower extremity:

Proximal left iliac artery patent.  Mild atherosclerosis.

Veins: Unremarkable appearance of the systemic venous system.

Portal system:

Esophageal varices are present within the lower mediastinum. There
is hyperenhancement within the mucosa of the GE junction as well as
within the cardia of the stomach, suggesting gastroesophageal
varices of the G0V1/G0V2 category. No isolated gastric varices are
identified.

Portal vein is patent and of relatively normal caliber. There is
recanalization of the umbilical vein. The majority of the portal
decompressive collaterals appears to be via gastro omental
collaterals of the SMV. There is a small to moderate-sized gastro
renal shunt to the left renal vein identified.

Review of the MIP images confirms the above findings.

NON-VASCULAR

Lower chest: Nodule within the right middle lobe on image 3 of
series [DATE] mm-4 mm.

Interval resolution of airspace disease which was present on the
comparison. No acute finding of the lower chest.

Hepatobiliary: Irregular attenuating/enhancing region involving
superior liver, segment 4 and segment 8, visualized on the portal
venous phase. The greatest estimated diameter on the axial images is
approximately 8 cm. On the arterial phase there are 2 foci of
hyperenhancement on the margin of the region, image 34 of series 12.
These changes appear new from the comparison CT dated [DATE].

Enlarged caudate lobe with nodular liver contour.

Gallbladder somewhat distended with minimal hyperdense material
layered dependently. Ascites is present adjacent to the gallbladder,
without evidence of gallbladder wall thickening or inflammation on
the CT.

Pancreas: Unremarkable.

Spleen: Diameter of the spleen measures 9.1 cm on the axial images

Adrenals/Urinary Tract:

- Right adrenal gland: Unremarkable

- Left adrenal gland: Unremarkable.

- Right kidney: No hydronephrosis, nephrolithiasis, inflammation, or
ureteral dilation. No focal lesion.

- Left Kidney: No hydronephrosis, nephrolithiasis, inflammation, or
ureteral dilation. No focal lesion.

Stomach/Bowel:

- Stomach: Hiatal hernia. Esophageal varices are present in the
lower mediastinum, endoluminal and periesophageal. Hyperenhancement
at the GE junction and in the stomach cardia, likely related to G0
V1/G0 V2 category.

- Small bowel: Unremarkable appearance of the visualized small
bowel.

- Appendix: Not visualized/imaged

- Colon: Unremarkable appearance of the visualized colon.

Lymphatic: Edema of the fat within the mesentery.  No adenopathy.

Mesenteric/peritoneal: Large volume low-attenuation ascites.

Musculoskeletal: No acute displaced fracture.
IMPRESSION: Stigmata of cirrhosis/portal hypertension, with ascites and
gastroesophageal varices.

Irregular enhancing/attenuating region involving segment 8 and
segment 4 of the liver, with ill-defined margin, and estimated
greatest diameter of 8 cm. This may be secondary to perfusion
anomaly, however, infiltrative HCC could have this appearance.
Correlation with forthcoming AFP may be useful, and if further
imaging is warranted, MRI would be the test of choice.

Mild atherosclerosis of the visualized aorta. Aortic Atherosclerosis
([RS]-[RS]).

Right middle lobe nodule measuring 3 mm-4 mm. No follow-up needed if
patient is low-risk. Non-contrast chest CT can be considered in 12
months if patient is high-risk. This recommendation follows the
consensus statement: Guidelines for Management of Incidental
Pulmonary Nodules Detected on CT Images: From the [HOSPITAL]

Ancillary findings as above.

## 2020-03-02 MED ORDER — IOHEXOL 350 MG/ML SOLN
100.0000 mL | Freq: Once | INTRAVENOUS | Status: AC | PRN
  Administered 2020-03-02: 100 mL via INTRAVENOUS

## 2020-03-02 NOTE — Telephone Encounter (Signed)
Patient has been scheduled for 03-06-2020 tt

## 2020-03-06 ENCOUNTER — Ambulatory Visit (HOSPITAL_COMMUNITY): Admission: RE | Admit: 2020-03-06 | Payer: Medicare Other | Source: Ambulatory Visit

## 2020-03-07 ENCOUNTER — Telehealth: Payer: Self-pay

## 2020-03-07 ENCOUNTER — Telehealth: Payer: Self-pay | Admitting: Gastroenterology

## 2020-03-07 NOTE — Telephone Encounter (Signed)
Echo rescheduled at [facility] 03/14/20@8am . Pt to go to entrance C. Spoke with Hoyle Sauer and she is aware and will arrange transportation.

## 2020-03-07 NOTE — Telephone Encounter (Signed)
Not really sure, I'm sure they can just reschedule the one he missed. Have them call [phone]

## 2020-03-07 NOTE — Telephone Encounter (Signed)
Phone call from patient wanting to know when his next appointment is scheduled.  CN called [facility] and spoke with Dr. Loletha Carrow' nurse Vaughan Basta.  Patient no showed yesterday for Echo at [facility].  It is rescheduled for 03/14/2020 @ 8:00 am. Called patient back and gave him this information and also requested Medicaid transportation for appointment.  Donald Michaelis RN, Congregational Nurse [phone]

## 2020-03-07 NOTE — Telephone Encounter (Signed)
Spoke with Hoyle Sauer, congregational nurse, and pt missed his appt for Tuesday for echo. Vivien Rota do we just need to enter another referral for pt to have the echo?

## 2020-03-09 ENCOUNTER — Other Ambulatory Visit: Payer: Self-pay

## 2020-03-09 ENCOUNTER — Ambulatory Visit: Payer: Medicare Other | Admitting: Physician Assistant

## 2020-03-12 ENCOUNTER — Other Ambulatory Visit: Payer: Self-pay

## 2020-03-12 ENCOUNTER — Telehealth: Payer: Self-pay

## 2020-03-12 ENCOUNTER — Other Ambulatory Visit: Payer: Self-pay | Admitting: Gastroenterology

## 2020-03-12 DIAGNOSIS — K7031 Alcoholic cirrhosis of liver with ascites: Secondary | ICD-10-CM

## 2020-03-12 NOTE — Telephone Encounter (Signed)
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  Will await appointment info.

## 2020-03-13 NOTE — Telephone Encounter (Signed)
Scheduled for 03-20-20 @ 130pm

## 2020-03-14 ENCOUNTER — Other Ambulatory Visit: Payer: Self-pay

## 2020-03-14 ENCOUNTER — Ambulatory Visit (HOSPITAL_COMMUNITY)
Admission: RE | Admit: 2020-03-14 | Discharge: 2020-03-14 | Disposition: A | Discharge status: Home / Self Care | Payer: Medicare Other | Source: Ambulatory Visit | Attending: Gastroenterology | Admitting: Gastroenterology

## 2020-03-14 DIAGNOSIS — I8510 Secondary esophageal varices without bleeding: Secondary | ICD-10-CM | POA: Diagnosis not present

## 2020-03-14 DIAGNOSIS — K7031 Alcoholic cirrhosis of liver with ascites: Secondary | ICD-10-CM | POA: Diagnosis present

## 2020-03-14 DIAGNOSIS — B182 Chronic viral hepatitis C: Secondary | ICD-10-CM | POA: Diagnosis not present

## 2020-03-14 DIAGNOSIS — N5089 Other specified disorders of the male genital organs: Secondary | ICD-10-CM | POA: Insufficient documentation

## 2020-03-14 DIAGNOSIS — R600 Localized edema: Secondary | ICD-10-CM | POA: Insufficient documentation

## 2020-03-14 NOTE — Progress Notes (Signed)
  Echocardiogram 2D Echocardiogram has been performed.  Darlina Sicilian M 03/14/2020, 8:27 AM

## 2020-03-14 NOTE — Telephone Encounter (Signed)
Thank you.  Please also arrange a therapeutic paracentesis for this patient next week or the week after.  Maximum 5 liters removal, administer 25 grams IV albumin

## 2020-03-20 ENCOUNTER — Encounter: Payer: Self-pay | Admitting: *Deleted

## 2020-03-20 ENCOUNTER — Other Ambulatory Visit: Payer: Self-pay

## 2020-03-20 ENCOUNTER — Ambulatory Visit
Admission: RE | Admit: 2020-03-20 | Discharge: 2020-03-20 | Disposition: A | Discharge status: Home / Self Care | Payer: Medicare Other | Source: Ambulatory Visit | Attending: Gastroenterology | Admitting: Gastroenterology

## 2020-03-20 DIAGNOSIS — K7031 Alcoholic cirrhosis of liver with ascites: Secondary | ICD-10-CM

## 2020-03-20 HISTORY — PX: IR RADIOLOGIST EVAL & MGMT: IMG5224

## 2020-03-20 NOTE — Telephone Encounter (Signed)
Patient has been scheduled for 03-23-2020 @ 9am. The interpretor, who speaks english answered the phone at his home phone number has been given all the details for the paracentesis.

## 2020-03-20 NOTE — Consult Note (Signed)
Chief Complaint: Patient was seen in consultation today for recurrent symptomatic large volume ascites at the request of Danis,Henry L III  Referring Physician(s): Danis,Henry L III  History of Present Illness: Donald Aguilar is a 69 y.o.  [name] man.  History obtained from the patient with the aid of interpreter, and epic chart.  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 Large volume ascites and severe scrotal edema, requiring frequent paracentesis.  14 paracentesis procedures since 09/29/2019.  Relative hypotension limiting ability to increase diuretics. Challenging social situation with limited resources and health literacy.  The patient continues to struggle with generalized abdominal discomfort from large volume ascites, makes it difficult to sleep.  He also continues to have severe scrotal edema. He has not been assessed for liver transplant.   Past Medical History:  Diagnosis Date  . 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 GERD (gastroesophageal reflux disease)   . Hepatitis C    Genotype 1a    Past Surgical History:  Procedure Laterality Date  . BIOPSY  09/13/2019   Procedure: BIOPSY;  Surgeon: Ladene Artist, MD;  Location: [facility] ENDOSCOPY;  Service: Endoscopy;;  . ESOPHAGOGASTRODUODENOSCOPY (EGD) WITH PROPOFOL N/A 09/13/2019   Procedure: ESOPHAGOGASTRODUODENOSCOPY (EGD) WITH PROPOFOL;  Surgeon: Ladene Artist, MD;  Location: [facility] ENDOSCOPY;  Service: Endoscopy;  Laterality: N/A;  . IR PARACENTESIS  09/12/2019  . IR PARACENTESIS  09/29/2019  . IR PARACENTESIS  10/19/2019  . IR PARACENTESIS  11/02/2019  . IR PARACENTESIS  11/10/2019  . IR PARACENTESIS  11/22/2019  . IR PARACENTESIS  12/06/2019  . IR PARACENTESIS  12/16/2019  . IR PARACENTESIS  12/23/2019  . IR PARACENTESIS  12/30/2019  . IR PARACENTESIS  01/06/2020  . IR PARACENTESIS  01/16/2020  . IR PARACENTESIS  02/01/2020    . IR PARACENTESIS  02/08/2020  . IR PARACENTESIS  02/28/2020  . IR RADIOLOGIST EVAL & MGMT  03/20/2020    Allergies: Patient has no known allergies.  Medications: Prior to Admission medications   Medication Sig Start Date End Date Taking? Authorizing Provider  diphenhydrAMINE (BENADRYL) 25 MG tablet Take 25 mg by mouth every 6 (six) hours as needed.    [provider]  furosemide (LASIX) 40 MG tablet Take 1 tablet (40 mg total) by mouth daily. 02/24/20   Doran Stabler, MD  ibuprofen (ADVIL) 200 MG tablet Take 200 mg by mouth every 6 (six) hours as needed.    [provider]  midodrine (PROAMATINE) 5 MG tablet Take 1 tablet (5 mg total) by mouth 3 (three) times daily with meals. 12/14/19   Doran Stabler, MD  pantoprazole (PROTONIX) 40 MG tablet Take 1 tablet by mouth twice daily 02/17/20   Esterwood, Amy S, [name]  potassium chloride SA (KLOR-CON) 20 MEQ tablet Take 2 tablets by mouth once daily 02/14/20   Doran Stabler, MD  spironolactone (ALDACTONE) 50 MG tablet Take 1 tablet (50 mg total) by mouth daily. 02/24/20   Doran Stabler, MD     Family History  Problem Relation Age of Onset  . Liver cancer Neg Hx     Social History   Socioeconomic History  . Marital status: Married    Spouse name: Not on file  . Number of children: Not on file  . Years of education: Not on file  . Highest education level: Not on file  Occupational History  .  Not on file  Tobacco Use  . Smoking status: Current Every Day Smoker    Years: 50.00    Types: Cigarettes  . Smokeless tobacco: Never Used  . Tobacco comment: approximately a couple of packs a week  Substance and Sexual Activity  . Alcohol use: Not Currently    Comment: last drank in September, 2020  . Drug use: Never  . Sexual activity: Not on file  Other Topics Concern  . Not on file  Social History Narrative   Leave with 3 friends   Smoke 2 cigarette  a day   Social Determinants of Health   Financial  Resource Strain:   . Difficulty of Paying Living Expenses:   Food Insecurity:   . Worried About Charity fundraiser in the Last Year:   . Arboriculturist in the Last Year:   Transportation Needs:   . Film/video editor (Medical):   Marland Kitchen Lack of Transportation (Non-Medical):   Physical Activity:   . Days of Exercise per Week:   . Minutes of Exercise per Session:   Stress:   . Feeling of Stress :   Social Connections:   . Frequency of Communication with Friends and Family:   . Frequency of Social Gatherings with Friends and Family:   . Attends Religious Services:   . Active Member of Clubs or Organizations:   . Attends Archivist Meetings:   Marland Kitchen Marital Status:     ECOG Status: 2 - Symptomatic, <50% confined to bed  Review of Systems: A 12 point ROS discussed and pertinent positives are indicated in the HPI above.  All other systems are negative.  Review of Systems  Vital Signs: There were no vitals taken for this visit.  Physical Exam Constitutional: Oriented to person, place, and time. Thin.  No distress.   HENT:  Head: Normocephalic and atraumatic.  Eyes: Conjunctivae and EOM are normal. Right eye exhibits no discharge. Left eye exhibits no discharge. No scleral icterus.  Neck: No JVD present.  Pulmonary/Chest: Effort normal. No stridor. No respiratory distress.  Abdomen: soft, distended Neurological:  alert and oriented to person, place, and time.  Skin: Skin is warm and dry.  not diaphoretic.  Psychiatric:   normal mood and affect.   behavior is normal. Judgment and thought content normal.       Imaging: DG Chest 2 View  Result Date: 02/24/2020 CLINICAL DATA:  Alcoholic cirrhosis with ascites. Preoperative assessment for paracentesis. EXAM: CHEST - 2 VIEW COMPARISON:  Radiographs 10/12/2019 FINDINGS: The heart size and mediastinal contours are stable. There is aortic atherosclerosis. Interval improved aeration of the lung bases which are now clear. No  significant pleural effusion or pneumothorax. Old left clavicle and bilateral rib fractures. IMPRESSION: No active cardiopulmonary process. Electronically Signed   By: Richardean Sale M.D.   On: 02/24/2020 15:20   CT ANGIO ABDOMEN W &/OR WO CONTRAST  Result Date: 03/02/2020 CLINICAL DATA:  69 year old male with a history of recurrent ascites, portal hypertension, varices EXAM: CT ANGIOGRAPHY ABDOMEN TECHNIQUE: Multidetector CT imaging of the abdomen was performed using the standard protocol during bolus administration of intravenous contrast. Multiplanar reconstructed images and MIPs were obtained and reviewed to evaluate the vascular anatomy. CONTRAST:  116mL OMNIPAQUE IOHEXOL 350 MG/ML SOLN COMPARISON:  09/09/2019 FINDINGS: VASCULAR Aorta: Unremarkable course, caliber, contour of the abdominal aorta. No dissection, aneurysm, or periaortic fluid. Minimal atherosclerosis of the visualized aorta. Celiac: Celicomesenteric trunk, with mild atherosclerosis. SMA: Celicomesenteric trunk, with mild  atherosclerosis. Visualized mesenteric branches are patent. Renals: Single right renal artery visualized. Minimal atherosclerosis. Single left renal artery visualized. No significant atherosclerotic changes. IMA: IMA origin is visualized and patent. Right lower extremity: Proximal right iliac artery patent.  Mild atherosclerosis. Left lower extremity: Proximal left iliac artery patent.  Mild atherosclerosis. Veins: Unremarkable appearance of the systemic venous system. Portal system: Esophageal varices are present within the lower mediastinum. There is hyperenhancement within the mucosa of the GE junction as well as within the cardia of the stomach, suggesting gastroesophageal varices of the G0V1/G0V2 category. No isolated gastric varices are identified. Portal vein is patent and of relatively normal caliber. There is recanalization of the umbilical vein. The majority of the portal decompressive collaterals appears to be via  gastro omental collaterals of the SMV. There is a small to moderate-sized gastro renal shunt to the left renal vein identified. Review of the MIP images confirms the above findings. NON-VASCULAR Lower chest: Nodule within the right middle lobe on image 3 of series 11, 3 mm-4 mm. Interval resolution of airspace disease which was present on the comparison. No acute finding of the lower chest. Hepatobiliary: Irregular attenuating/enhancing region involving superior liver, segment 4 and segment 8, visualized on the portal venous phase. The greatest estimated diameter on the axial images is approximately 8 cm. On the arterial phase there are 2 foci of hyperenhancement on the margin of the region, image 34 of series 12. These changes appear new from the comparison CT dated November of 2020. Enlarged caudate lobe with nodular liver contour. Gallbladder somewhat distended with minimal hyperdense material layered dependently. Ascites is present adjacent to the gallbladder, without evidence of gallbladder wall thickening or inflammation on the CT. Pancreas: Unremarkable. Spleen: Diameter of the spleen measures 9.1 cm on the axial images Adrenals/Urinary Tract: - Right adrenal gland: Unremarkable - Left adrenal gland: Unremarkable. - Right kidney: No hydronephrosis, nephrolithiasis, inflammation, or ureteral dilation. No focal lesion. - Left Kidney: No hydronephrosis, nephrolithiasis, inflammation, or ureteral dilation. No focal lesion. Stomach/Bowel: - Stomach: Hiatal hernia. Esophageal varices are present in the lower mediastinum, endoluminal and periesophageal. Hyperenhancement at the GE junction and in the stomach cardia, likely related to G0 V1/G0 V2 category. - Small bowel: Unremarkable appearance of the visualized small bowel. - Appendix: Not visualized/imaged - Colon: Unremarkable appearance of the visualized colon. Lymphatic: Edema of the fat within the mesentery.  No adenopathy. Mesenteric/peritoneal: Large volume  low-attenuation ascites. Musculoskeletal: No acute displaced fracture. IMPRESSION: Stigmata of cirrhosis/portal hypertension, with ascites and gastroesophageal varices. Irregular enhancing/attenuating region involving segment 8 and segment 4 of the liver, with ill-defined margin, and estimated greatest diameter of 8 cm. This may be secondary to perfusion anomaly, however, infiltrative HCC could have this appearance. Correlation with forthcoming AFP may be useful, and if further imaging is warranted, MRI would be the test of choice. Mild atherosclerosis of the visualized aorta. Aortic Atherosclerosis (ICD10-I70.0). Right middle lobe nodule measuring 3 mm-4 mm. No follow-up needed if patient is low-risk. Non-contrast chest CT can be considered in 12 months if patient is high-risk. This recommendation follows the consensus statement: Guidelines for Management of Incidental Pulmonary Nodules Detected on CT Images: From the Fleischner Society 2017; Radiology 2017; 284:228-243. Ancillary findings as above. Signed, Dulcy Fanny. Dellia Nims, RPVI [facility] Radiology Electronically Signed   By: Corrie Mckusick D.O.   On: 03/02/2020 13:09   ECHOCARDIOGRAM COMPLETE  Result Date: 03/14/2020    ECHOCARDIOGRAM REPORT   Patient Name:  Lorane Gell Date of Exam: 03/14/2020 Medical Rec #:  [id number]    Height:       65.0 in Accession #:    [id number]   Weight:       128.0 lb Date of Birth:  24-Sep-1951    BSA:          1.636 m Patient Age:    53 years     BP:           88/58 mmHg Patient Gender: M            HR:           67 bpm. Exam Location:  Outpatient Procedure: 2D Echo Indications:    Cirrhosis of liver ([place]) [id number]  History:        Patient has no prior history of Echocardiogram examinations.                 Alcoholic cirrhosis of the liver with ascites. Chronic hepatitis                 C.  Sonographer:    Darlina Sicilian RDCS Referring Phys: [id number] [place]  1. Moderate ascites is present.  2. Left ventricular ejection fraction, by estimation, is 60 to 65%. The left ventricle has normal function. The left ventricle has no regional wall motion abnormalities. Left ventricular diastolic parameters are consistent with Grade I diastolic dysfunction (impaired relaxation). The average left ventricular global longitudinal strain is -22.3 %. The global longitudinal strain is normal.  3. Right ventricular systolic function is normal. The right ventricular size is normal.  4. The mitral valve is normal in structure. Mild mitral valve regurgitation. No evidence of mitral stenosis.  5. The aortic valve is normal in structure. Aortic valve regurgitation is not visualized. No aortic stenosis is present.  6. Aortic dilatation noted. There is mild dilatation at the level of the sinuses of Valsalva measuring 41 mm.  7. The inferior vena cava is normal in size with greater than 50% respiratory variability, suggesting right atrial pressure of 3 mmHg. FINDINGS  Left Ventricle: Left ventricular ejection fraction, by estimation, is 60 to 65%. The left ventricle has normal function. The left ventricle has no regional wall motion abnormalities. The average left ventricular global longitudinal strain is -22.3 %. The global longitudinal strain is normal. The left ventricular internal cavity size was normal in size. There is no left ventricular hypertrophy. Left ventricular diastolic parameters are consistent with Grade I diastolic dysfunction (impaired relaxation).  LV Wall Scoring: The basal inferior segment is hypokinetic. Right Ventricle: The right ventricular size is normal. No increase in right ventricular wall thickness. Right ventricular systolic function is normal. Left Atrium: Left atrial size was normal in size. Right Atrium: Right atrial size was normal in size. Pericardium: There is no evidence of pericardial effusion. Mitral Valve: The mitral valve is normal in  structure. Normal mobility of the mitral valve leaflets. Mild mitral annular calcification. Mild mitral valve regurgitation. No evidence of mitral valve stenosis. Tricuspid Valve: The tricuspid valve is normal in structure. Tricuspid valve regurgitation is not demonstrated. No evidence of tricuspid stenosis. Aortic Valve: The aortic valve is normal in structure. Aortic valve regurgitation is not visualized. No aortic stenosis is present. Pulmonic Valve: The pulmonic valve was normal in structure. Pulmonic valve regurgitation is not visualized. No evidence of pulmonic stenosis. Aorta: Aortic dilatation noted. There is mild dilatation at the level of the sinuses of Valsalva measuring 41  mm. Venous: The inferior vena cava is normal in size with greater than 50% respiratory variability, suggesting right atrial pressure of 3 mmHg. IAS/Shunts: No atrial level shunt detected by color flow Doppler. Additional Comments: Moderate ascites is present.  LEFT VENTRICLE PLAX 2D LVIDd:         4.30 cm  Diastology LVIDs:         3.30 cm  LV e' lateral:   7.72 cm/s LV PW:         0.80 cm  LV E/e' lateral: 9.1 LV IVS:        1.00 cm  LV e' medial:    7.72 cm/s LVOT diam:     2.20 cm  LV E/e' medial:  9.1 LV SV:         57 LV SV Index:   35       2D Longitudinal Strain LVOT Area:     3.80 cm 2D Strain GLS Avg:     -22.3 %  RIGHT VENTRICLE RV S prime:     11.00 cm/s LEFT ATRIUM             Index       RIGHT ATRIUM          Index LA diam:        3.00 cm 1.83 cm/m  RA Area:     9.63 cm LA Vol (A2C):   40.0 ml 24.44 ml/m RA Volume:   15.00 ml 9.17 ml/m LA Vol (A4C):   36.1 ml 22.06 ml/m LA Biplane Vol: 38.9 ml 23.77 ml/m  AORTIC VALVE LVOT Vmax:   65.30 cm/s LVOT Vmean:  48.800 cm/s LVOT VTI:    0.149 m  AORTA Ao Root diam: 4.10 cm MITRAL VALVE MV Area (PHT): 5.02 cm    SHUNTS MV Decel Time: 151 msec    Systemic VTI:  0.15 m MV E velocity: 70.30 cm/s  Systemic Diam: 2.20 cm MV A velocity: 81.00 cm/s MV E/A ratio:  0.87 Candee Furbish  MD Electronically signed by Candee Furbish MD Signature Date/Time: 03/14/2020/2:00:37 PM    Final    IR Radiologist Eval & Mgmt  Result Date: 03/20/2020 Please refer to notes tab for details about interventional procedure. (Op Note)  IR Paracentesis  Result Date: 02/28/2020 INDICATION: Patient with history of alcoholic cirrhosis with recurrent ascites. Request is made for therapeutic paracentesis up to 5 L. EXAM: ULTRASOUND GUIDED THERAPEUTIC PARACENTESIS MEDICATIONS: 10 mL 1% lidocaine COMPLICATIONS: None immediate. PROCEDURE: Informed written consent was obtained from the patient after a discussion of the risks, benefits and alternatives to treatment. A timeout was performed prior to the initiation of the procedure. Initial ultrasound scanning demonstrates a large amount of ascites within the right lower abdominal quadrant. The right lower abdomen was prepped and draped in the usual sterile fashion. 1% lidocaine was used for local anesthesia. Following this, a 19 gauge, 7-cm, Yueh catheter was introduced. An ultrasound image was saved for documentation purposes. The paracentesis was performed. The catheter was removed and a dressing was applied. The patient tolerated the procedure well without immediate post procedural complication. FINDINGS: A total of approximately 4.2 L of clear yellow fluid was removed. IMPRESSION: Successful ultrasound-guided paracentesis yielding 4.2 L of peritoneal fluid. Read by: Earley Abide, [name] Electronically Signed   By: Corrie Mckusick D.O.   On: 02/28/2020 09:46    Labs:  CBC: Recent Labs    09/28/19 1447 10/12/19 1601 11/02/19 0648 01/30/20 0919  WBC 7.3 7.2 6.1 7.2  HGB 11.8* 11.6* 12.0* 12.1*  HCT 35.8* 35.7* 35.6* 35.2*  PLT 220.0 190 226 266.0    COAGS: Recent Labs    09/12/19 0507 09/28/19 1447 11/02/19 0903 01/30/20 0919  INR 1.5* 1.5* 1.6* 1.2*    BMP: Recent Labs    09/14/19 0150 09/14/19 0150 09/15/19 0233 09/22/19 1031 10/12/19 1601  10/12/19 1601 11/02/19 0903 11/02/19 0903 11/08/19 1043 12/14/19 1217 01/30/20 0919 02/24/20 0934  NA 134*   < > 135   < > 137   < > 133*   < > 133* 133* 135 134*  K 3.4*   < > 3.5   < > 3.2*   < > 3.0*   < > 3.4* 3.1* 3.8 3.7  CL 101   < > 103   < > 100   < > 95*   < > 96 97 104 104  CO2 27   < > 27   < > 27   < > 28   < > 31 29 26 27   GLUCOSE 125*   < > 116*   < > 114*   < > 107*   < > 101* 93 96 87  BUN 10   < > 9   < > 9   < > 6*   < > 13 11 10 8   CALCIUM 7.5*   < > 7.6*   < > 8.2*   < > 8.0*   < > 8.6 9.1 8.4 8.5  CREATININE 0.67   < > 0.64   < > 0.81   < > 0.69   < > 0.85 0.77 0.69 0.75  GFRNONAA >60  --  >60  --  >60  --  >60  --   --   --   --   --   GFRAA >60  --  >60  --  >60  --  >60  --   --   --   --   --    < > = values in this interval not displayed.    LIVER FUNCTION TESTS: Recent Labs    09/28/19 1447 10/12/19 1601 11/02/19 0903 01/30/20 0919  BILITOT 2.3* 2.8* 2.0* 1.5*  AST 73* 69* 67* 48*  ALT 40 34 26 24  ALKPHOS 122* 88 64 109  PROT 8.0 7.6 6.9 7.8  ALBUMIN 2.3* 2.0* 2.1* 2.6*    TUMOR MARKERS: No results for input(s): AFPTM, CEA, CA199, CHROMGRNA in the last 8760 hours.  MELD = 10  Assessment and Plan:  My impression is that this patient has recurrent large volume abdominal ascites, almost certainly secondary to his advanced end-stage liver disease and cirrhosis, requiring frequent large-volume paracentesis for symptomatic control. His RV function is normal.  His meld score is good.  He would be an appropriate candidate for TIPS placement for ascites control. CTA confirms patency of the Main portal vein and intrahepatic branches, as well as right hepatic vein.  No anatomic contraindications to TIPS creation.  I would perform large-volume paracentesis concurrently. I discussed with the patient the pathophysiology of hepatic cirrhosis and subsequent ascites secondary to portal venous hypertension.  We discussed the TIPS creation technique, bypassing  the hepatic parenchyma to decrease portal venous hypertension and subsequent ascites creation.  We discussed  anticipated benefits, possible risks and complications including but not limited to bleeding, organ or nerve damage, infection, death.  We discussed the progressive nature of cirrhosis which can only be cured with hepatic transplant, and may worsen  with or without TIPS creation.  We acknowledged that TIPS creation is not a life prolonging procedure, only palliative.  We discussed the frequent postoperative requirement for outpatient p.o. lactulose to manage ammonia levels and avoid hepatic encephalopathy which if untreated can lead to lethargy, somnolence, mental slowness, and ultimately potentially coma and death.  We discussed the need for lifelong ultrasound surveillance to  confirm TIPS patency.  He seemed to understand and did ask appropriate questions,  Which were answered.  He is tired of the multiple appointments and is motivated to proceed. Accordingly, we can set him up for elective TIPS creation under general anesthesia at [facility], at his convenience.   Thank you for this interesting consult.  I greatly enjoyed meeting Declen Homrich and look forward to participating in their care.  A copy of this report was sent to the requesting provider on this date.  Electronically Signed: Rickard Rhymes 03/20/2020, 2:51 PM   I spent a total of  40 Minutes   in face to face in clinical consultation, greater than 50% of which was counseling/coordinating care for recurrent symptomatic large volume abdominal ascites secondary to hepatic cirrhosis.

## 2020-03-23 ENCOUNTER — Other Ambulatory Visit: Payer: Self-pay

## 2020-03-23 ENCOUNTER — Ambulatory Visit (HOSPITAL_COMMUNITY)
Admission: RE | Admit: 2020-03-23 | Discharge: 2020-03-23 | Disposition: A | Discharge status: Home / Self Care | Payer: Medicare Other | Source: Ambulatory Visit | Attending: Gastroenterology | Admitting: Gastroenterology

## 2020-03-23 DIAGNOSIS — K7031 Alcoholic cirrhosis of liver with ascites: Secondary | ICD-10-CM | POA: Diagnosis present

## 2020-03-23 HISTORY — PX: IR PARACENTESIS: IMG2679

## 2020-03-23 IMAGING — US IR PARACENTESIS
1 series · 2 of 2 positions shown · non-contrast
Comparison: none

INDICATION: Patient with a history of end-stage liver disease secondary to
alcohol abuse. Patient has recurrent large volume ascites.
Interventional radiology asked to perform a therapeutic
paracentesis.

[Series 1: ir (id) (id)/(id)/(id) ir · 2 of 2 slices shown]
[im 1/2]
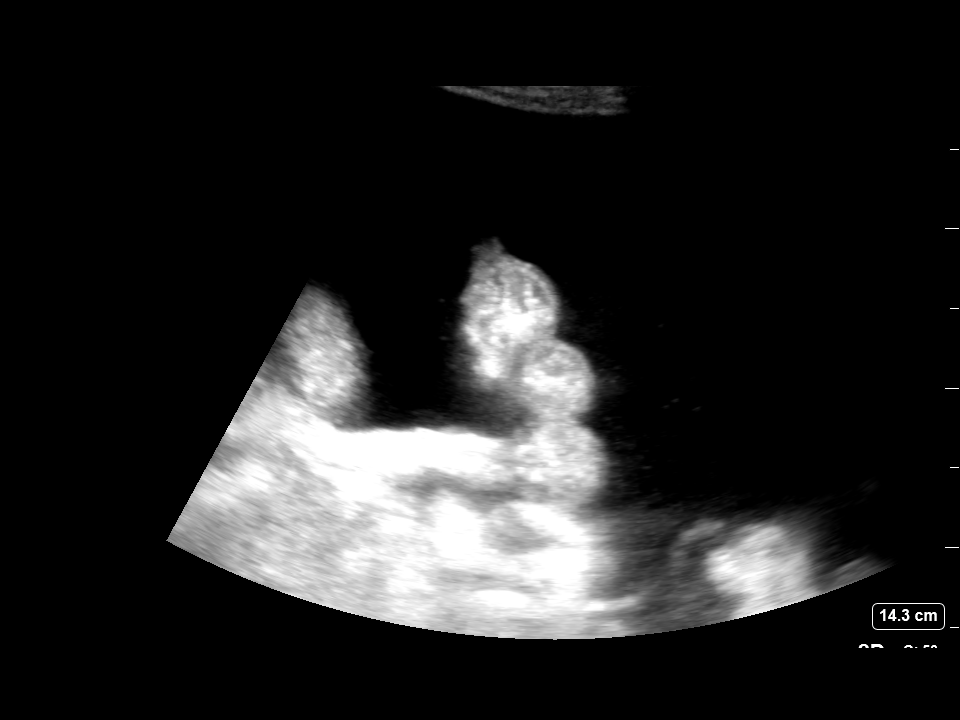
[im 2/2]
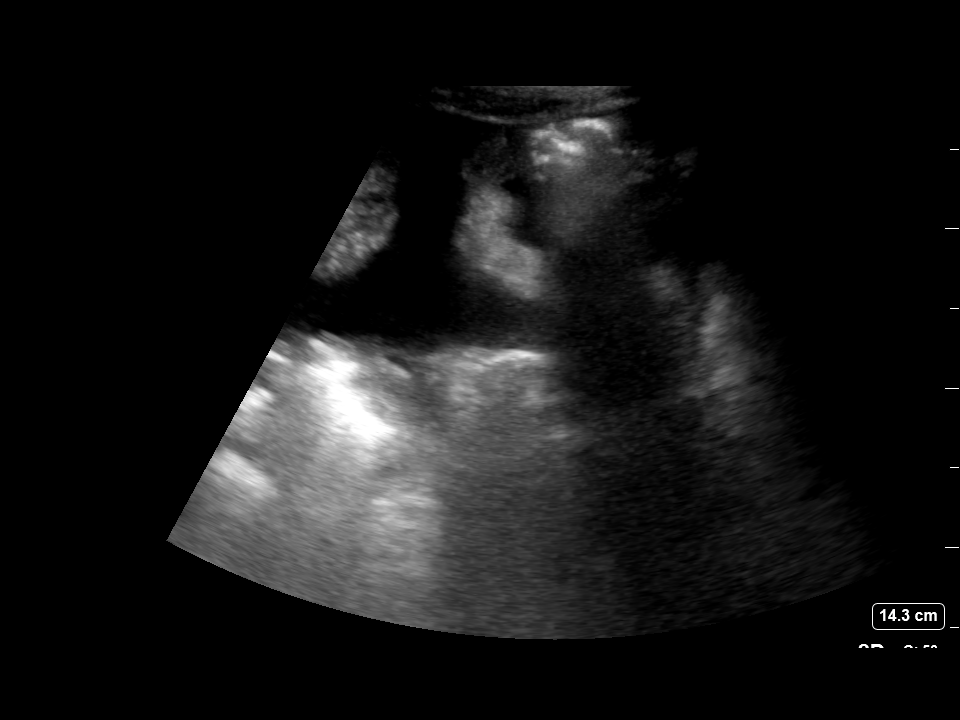

[2 of 2 positions shown; findings below may reference images not displayed]

EXAM:
ULTRASOUND GUIDED right PARACENTESIS

MEDICATIONS:
1% lidocaine 10 mL

COMPLICATIONS:
None immediate

PROCEDURE:
Informed written consent was obtained from the patient after a
discussion of the risks, benefits and alternatives to treatment. A
timeout was performed prior to the initiation of the procedure.

Initial ultrasound scanning demonstrates a large amount of ascites
within the right lower abdominal quadrant. The right lower abdomen
was prepped and draped in the usual sterile fashion. 1% lidocaine
was used for local anesthesia.

Following this, a 19 gauge, 7-cm, Yueh catheter was introduced. An
ultrasound image was saved for documentation purposes. The
paracentesis was performed. The catheter was removed and a dressing
was applied. The patient tolerated the procedure well without
immediate post procedural complication.
Patient received post-procedure intravenous albumin; see nursing
notes for details.
FINDINGS: A total of approximately 5 L of clear yellow fluid was removed.
IMPRESSION: Successful ultrasound-guided paracentesis yielding 5 L liters of
peritoneal fluid.

Read by: GIANG VI, NP

## 2020-03-23 MED ORDER — LIDOCAINE HCL 1 % IJ SOLN
INTRAMUSCULAR | Status: AC | PRN
  Administered 2020-03-23: 10 mL

## 2020-03-23 MED ORDER — ALBUMIN HUMAN 25 % IV SOLN
50.0000 g | Freq: Once | INTRAVENOUS | Status: AC
  Administered 2020-03-23: 50 g via INTRAVENOUS
  Filled 2020-03-23: qty 200

## 2020-03-23 MED ORDER — LIDOCAINE HCL 1 % IJ SOLN
INTRAMUSCULAR | Status: AC
  Filled 2020-03-23: qty 20

## 2020-03-23 MED ORDER — ALBUMIN HUMAN 25 % IV SOLN
INTRAVENOUS | Status: AC
  Filled 2020-03-23: qty 200

## 2020-03-23 NOTE — Procedures (Signed)
PROCEDURE SUMMARY:  Successful US guided paracentesis from the right lateral abdomen.  Yielded 5L of clear yellow fluid.  No immediate complications.  Pt tolerated well.    EBL < 54mL  Theresa Duty, NP 03/23/2020 9:31 AM

## 2020-04-05 ENCOUNTER — Other Ambulatory Visit: Payer: Self-pay

## 2020-04-05 ENCOUNTER — Telehealth: Payer: Self-pay | Admitting: Gastroenterology

## 2020-04-05 DIAGNOSIS — K7031 Alcoholic cirrhosis of liver with ascites: Secondary | ICD-10-CM

## 2020-04-05 NOTE — Telephone Encounter (Signed)
Pt requesting paracentesis, last one was done 6/4, please advise.

## 2020-04-05 NOTE — Telephone Encounter (Signed)
Linda,    Please arrange a therapeutic paracentesis, maximum 7 L fluid removal, give 50 g of 25% IV albumin.   Dr. Vernard Gambles,  Do you know when this patient is scheduled for TIPS?   -  - Wilfrid Lund, MD    [facility]

## 2020-04-05 NOTE — Telephone Encounter (Signed)
Pt requested to schedule a paracentesis.  

## 2020-04-05 NOTE — Telephone Encounter (Signed)
Pt scheduled for IR para at [facility] 04/09/20 at 9am. Pt to arrive there at 8:45am. Pt aware of appt.

## 2020-04-06 ENCOUNTER — Telehealth: Payer: Self-pay

## 2020-04-06 NOTE — Telephone Encounter (Signed)
[name], congregational nurse requested to reschedule paracentesis.  She stated that Medicare requires a 3-day notice for appointments.

## 2020-04-06 NOTE — Telephone Encounter (Signed)
Rescheduled paracentesis to Thursday at 9 am with a 8:45 am arrival time at [facility] [name] notified for patient transportation. Attempted to notify patient, phone rang and then a busy tone, unable to lm.

## 2020-04-06 NOTE — Telephone Encounter (Signed)
Phone call to patient to remind him of appointment on Monday 04/09/2020 for paracentesis.  States he does not have transportation.  CN called [facility] and requested appointment be rescheduled to allow for Medicaid transportation to be arranged.  Appointment changed to Thursday 04/12/2020 and transportation scheduled.  [name] RN, Congregational Nurse [phone]

## 2020-04-09 ENCOUNTER — Ambulatory Visit (HOSPITAL_COMMUNITY): Payer: Medicare Other

## 2020-04-10 ENCOUNTER — Other Ambulatory Visit (HOSPITAL_COMMUNITY): Payer: Self-pay | Admitting: Interventional Radiology

## 2020-04-10 DIAGNOSIS — R188 Other ascites: Secondary | ICD-10-CM

## 2020-04-11 ENCOUNTER — Ambulatory Visit (INDEPENDENT_AMBULATORY_CARE_PROVIDER_SITE_OTHER): Payer: Medicare Other | Admitting: Internal Medicine

## 2020-04-11 ENCOUNTER — Encounter: Payer: Self-pay | Admitting: Internal Medicine

## 2020-04-11 ENCOUNTER — Other Ambulatory Visit: Payer: Self-pay

## 2020-04-11 DIAGNOSIS — B182 Chronic viral hepatitis C: Secondary | ICD-10-CM | POA: Diagnosis present

## 2020-04-11 DIAGNOSIS — K7469 Other cirrhosis of liver: Secondary | ICD-10-CM | POA: Diagnosis not present

## 2020-04-11 NOTE — Assessment & Plan Note (Signed)
Followed by GI, Dr. Loletha Carrow.  Continued ascites and TIPS procedure scheduled for next month.

## 2020-04-11 NOTE — Assessment & Plan Note (Signed)
At this time, will defer treatment until he is seen by hepatology and I certainly am ok if they want to treat him for his hepatitis C.  Otherwise, I will have him back here and start him on treatment as indicated after the hepatology evaluation if no transplant evaluation indicated with ribavirin based treatment.

## 2020-04-11 NOTE — Progress Notes (Signed)
   Subjective:    Patient ID: Donald Aguilar, male    DOB: 09/25/1951, 69 y.o.   MRN: [id number]  HPI Here for follow up of chronic hepatitis C and cirrhosis.   I saw him as a new patient in May and with his advanced liver disease and hepatitis C, I felt he should be evaluated by hepatology to determine if a transplant evaluation should be done or not.  Unfortunately, despite getting the appointment arranged, a translator scheduled, he did not show up.  No new issues otherwise.  He is scheduled for TIPS 7/6.     Review of Systems  Constitutional: Negative for fatigue.  Gastrointestinal: Negative for diarrhea and nausea.  Skin: Negative for rash.       Objective:   Physical Exam Constitutional:      Appearance: Normal appearance.  Abdominal:     General: There is distension.     Comments: + ascites  Neurological:     Mental Status: He is alert.           Assessment & Plan:

## 2020-04-12 ENCOUNTER — Telehealth (HOSPITAL_COMMUNITY): Payer: Self-pay | Admitting: Radiology

## 2020-04-12 ENCOUNTER — Inpatient Hospital Stay (HOSPITAL_COMMUNITY): Admission: RE | Admit: 2020-04-12 | Payer: Medicare Other | Source: Ambulatory Visit

## 2020-04-12 NOTE — Telephone Encounter (Signed)
Pt called and cannot make his appt for a para today due to no ride. Called to reschedule his appt, no answer and no VM. JM

## 2020-04-17 ENCOUNTER — Ambulatory Visit (HOSPITAL_COMMUNITY)
Admission: RE | Admit: 2020-04-17 | Discharge: 2020-04-17 | Disposition: A | Discharge status: Home / Self Care | Payer: Medicare Other | Source: Ambulatory Visit | Attending: Gastroenterology | Admitting: Gastroenterology

## 2020-04-17 DIAGNOSIS — K7031 Alcoholic cirrhosis of liver with ascites: Secondary | ICD-10-CM

## 2020-04-17 HISTORY — PX: IR PARACENTESIS: IMG2679

## 2020-04-17 IMAGING — US IR PARACENTESIS
1 series · 4 of 4 positions shown · non-contrast
Comparison: none

INDICATION: Patient with a history of end-stage liver disease and recurrent
large volume ascites. Interventional radiology asked to perform a
therapeutic paracentesis.

[Series 1: ir paracentesis · 4 of 4 slices shown]
[im 1/4]
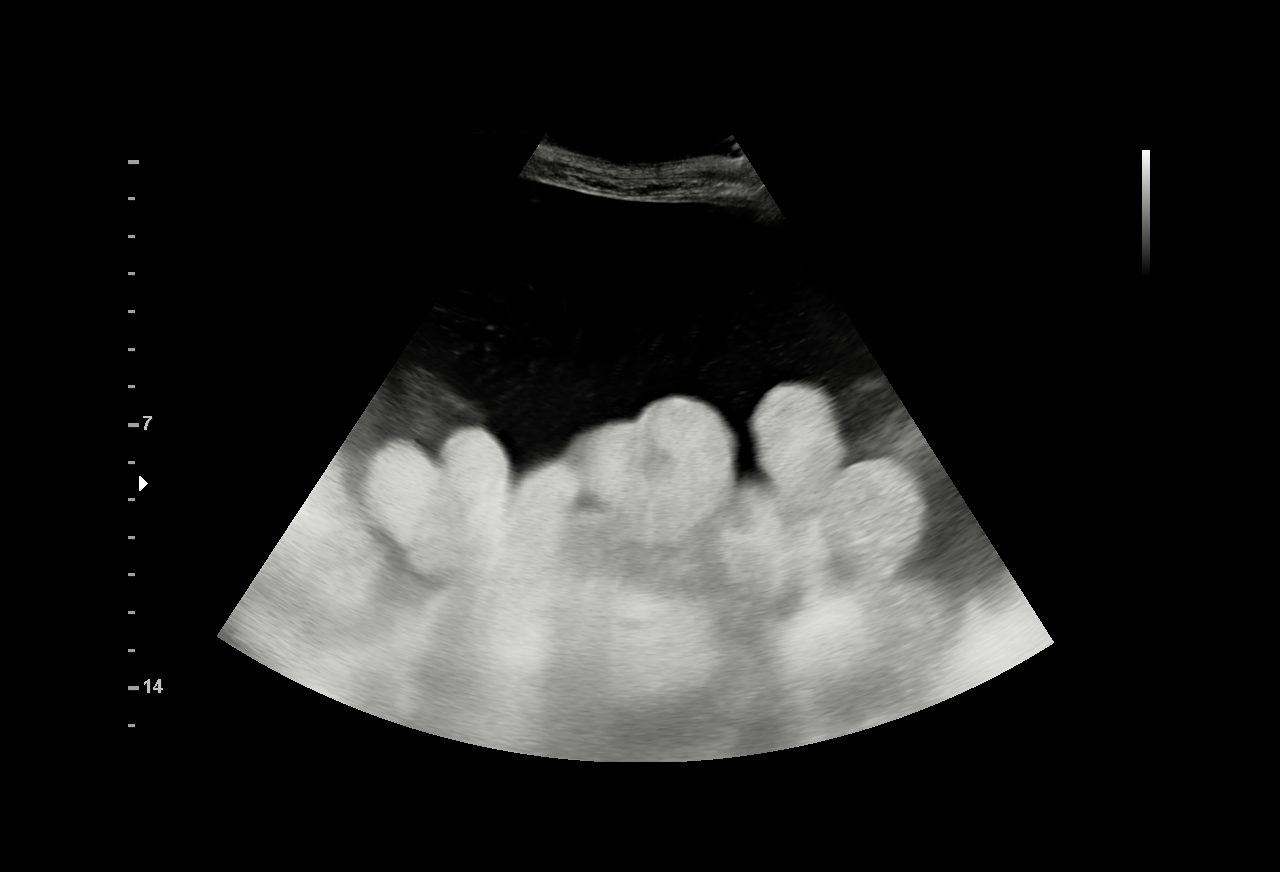
[im 2/4]
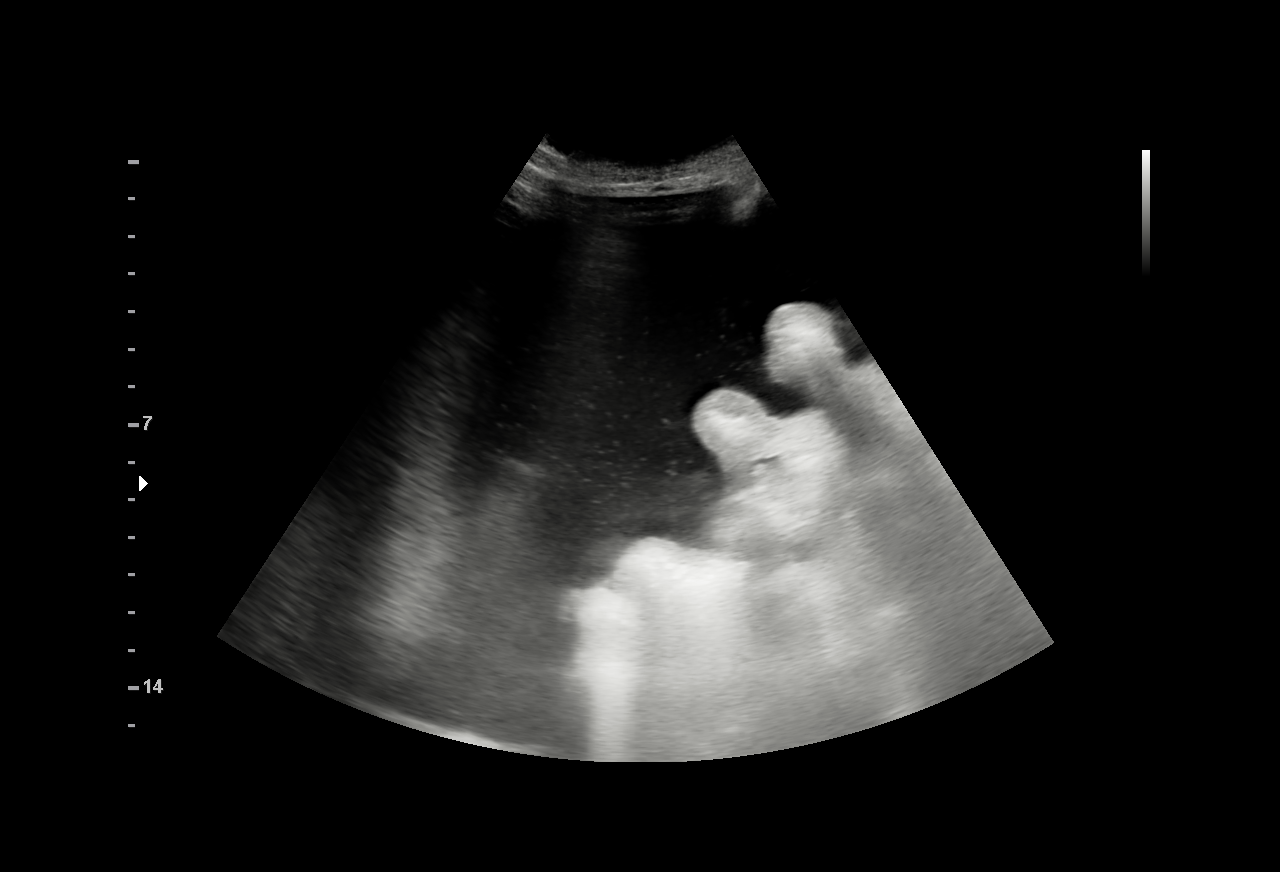
[im 3/4]
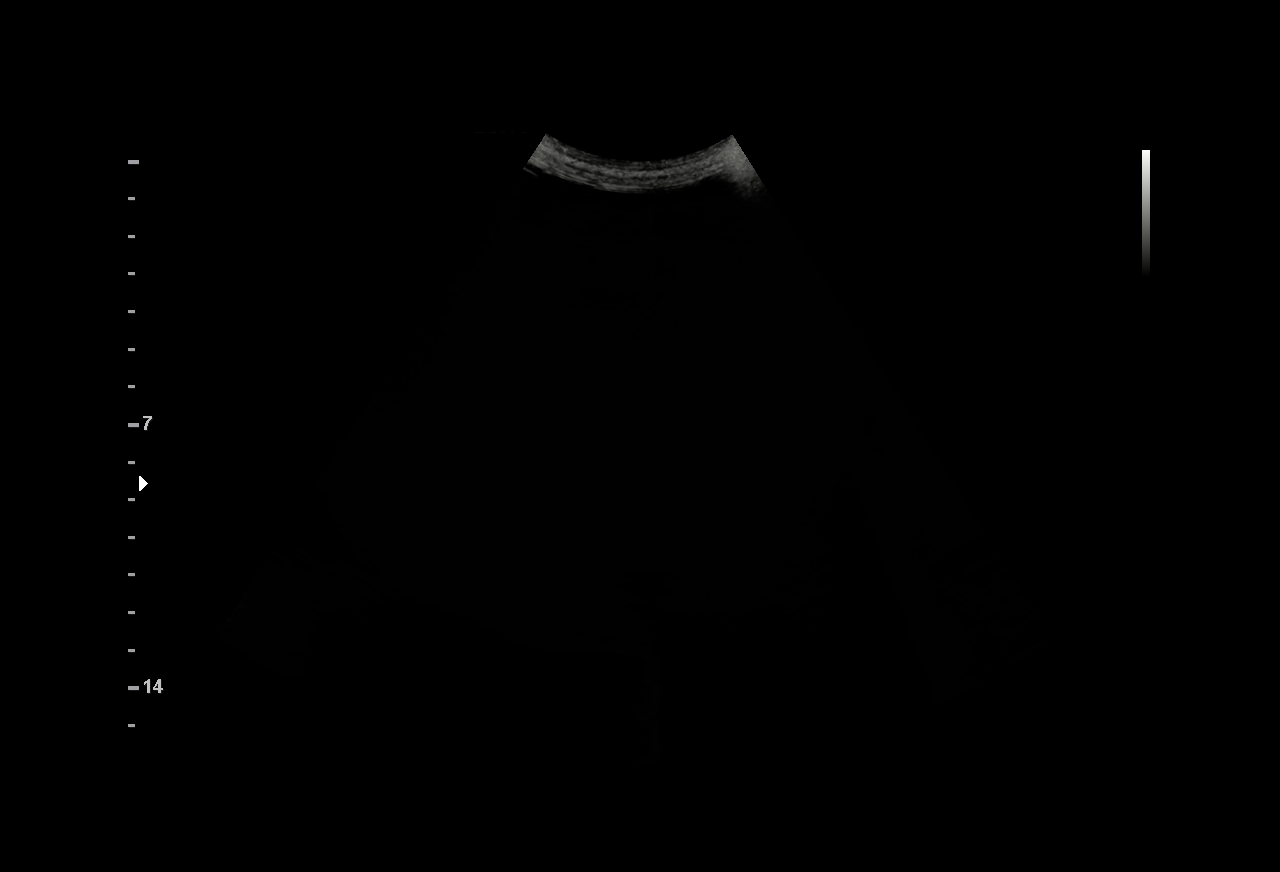
[im 4/4]
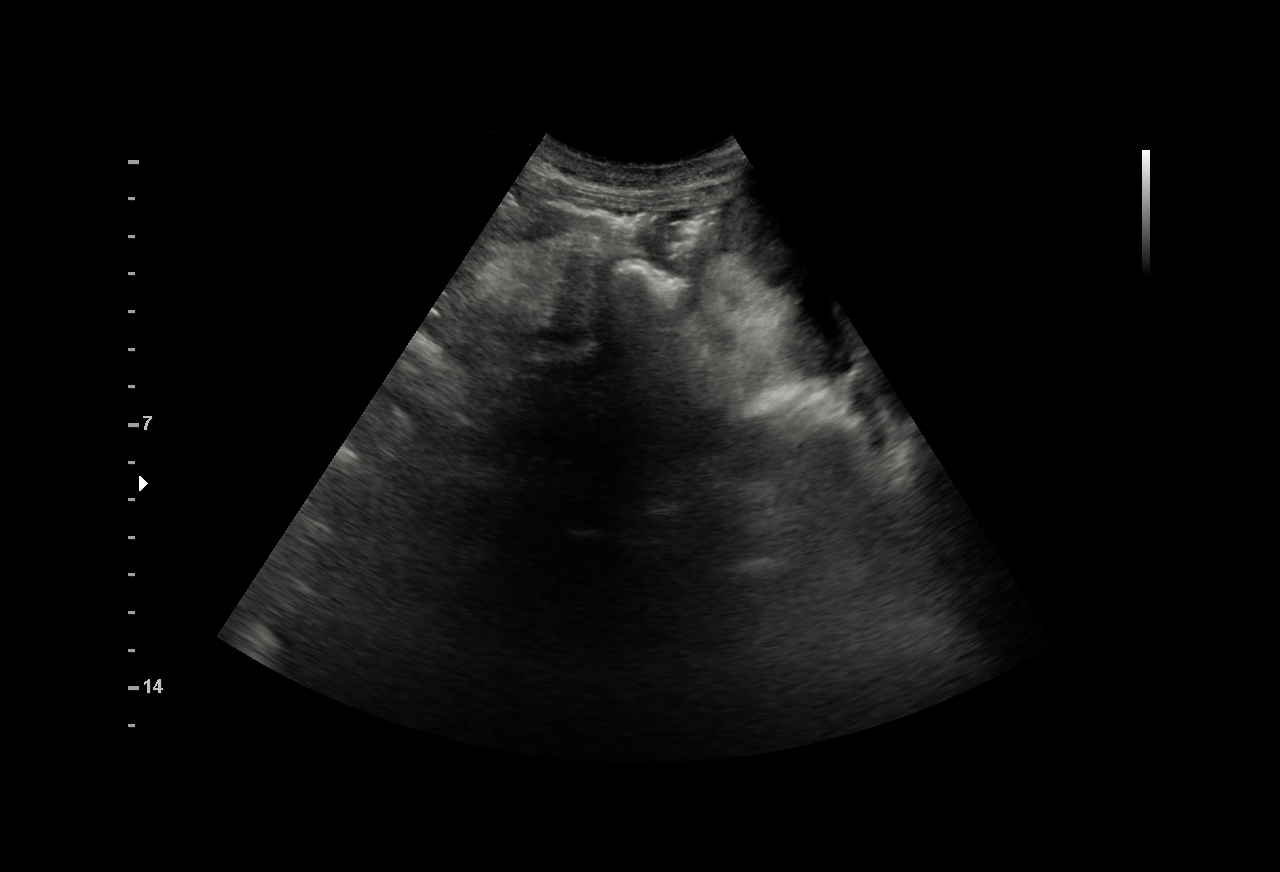

[4 of 4 positions shown; findings below may reference images not displayed]

EXAM:
ULTRASOUND GUIDED PARACENTESIS

MEDICATIONS:
1% lidocaine 10 mL

COMPLICATIONS:
None immediate

PROCEDURE:
Informed written consent was obtained from the patient after a
discussion of the risks, benefits and alternatives to treatment. A
timeout was performed prior to the initiation of the procedure.

Initial ultrasound scanning demonstrates a large amount of ascites
within the right lower abdominal quadrant. The right lower abdomen
was prepped and draped in the usual sterile fashion. 1% lidocaine
was used for local anesthesia.

Following this, a 19 gauge, 7-cm, Yueh catheter was introduced. An
ultrasound image was saved for documentation purposes. The
paracentesis was performed. The catheter was removed and a dressing
was applied. The patient tolerated the procedure well without
immediate post procedural complication.
Patient received post-procedure intravenous albumin; see nursing
notes for details.
FINDINGS: A total of approximately 7.1 L of clear yellow fluid was removed.
IMPRESSION: Successful ultrasound-guided paracentesis yielding 7.1 liters of
peritoneal fluid. Read by: RILIND, NP

## 2020-04-17 MED ORDER — LIDOCAINE HCL 1 % IJ SOLN
INTRAMUSCULAR | Status: DC | PRN
  Administered 2020-04-17: 10 mL

## 2020-04-17 MED ORDER — LIDOCAINE HCL 1 % IJ SOLN
INTRAMUSCULAR | Status: AC
  Filled 2020-04-17: qty 20

## 2020-04-17 MED ORDER — ALBUMIN HUMAN 25 % IV SOLN
INTRAVENOUS | Status: AC
  Filled 2020-04-17: qty 200

## 2020-04-17 NOTE — Procedures (Signed)
PROCEDURE SUMMARY:  Successful US guided paracentesis from right lateral abdomen. Yielded 7.1 L of clear yellow fluid.  No immediate complications.  Pt tolerated well.   EBL < 59mL  Donald Aguilar, AGACNP-BC [phone] 04/17/2020, 1:50 PM

## 2020-04-19 ENCOUNTER — Encounter (HOSPITAL_COMMUNITY): Payer: Self-pay | Admitting: *Deleted

## 2020-04-19 NOTE — Progress Notes (Addendum)
I spoke on the phone with Mr. Biegel  using  Interpreter, Humberto Leep. Mr. Cloe denies chest pain or shortness of breath. Patient tested negative for Covid_6/29/2021_ and has been in quarantine since that time.  Mr. Carrington could not tell us medication, I asked if he takes Advil, he confirm that he takes Advil. I instructed patient to stop Advil, continue to take other medications until Tuesday am, do not take any medications the am of procedure. I asked patient to bring medication list to the hospital.  Humberto Leep, the interpreter has worked with Mr. Escamilla in the past, Mr Paulla Fore reported that Internal Department usually schedules patient's transportation. I called [facility], it is closed today, it will open at 0900, I will call  again in am.  I called Intervention radiology and spoke with Rose, rose reports that Anderson Malta was in contact with patient's niece and she is bring patient at 0600. I spoke with niece, keean, wilmeth took information that patient should arrive at [place].  I called Mr. Paulla Fore and informed him and that IR instructed patient to arrive at 0600.

## 2020-04-20 ENCOUNTER — Inpatient Hospital Stay (HOSPITAL_COMMUNITY): Admission: RE | Admit: 2020-04-20 | Payer: Medicare Other | Source: Ambulatory Visit

## 2020-04-20 ENCOUNTER — Other Ambulatory Visit: Payer: Self-pay | Admitting: Student

## 2020-04-23 ENCOUNTER — Ambulatory Visit (HOSPITAL_COMMUNITY): Payer: Medicare Other | Admitting: Anesthesiology

## 2020-04-23 NOTE — Anesthesia Preprocedure Evaluation (Deleted)
Anesthesia Evaluation    Reviewed: Allergy & Precautions, Patient's Chart, lab work & pertinent test results, Unable to perform ROS - Chart review only  Airway        Dental   Pulmonary shortness of breath, Current Smoker and Patient abstained from smoking.,           Cardiovascular negative cardio ROS    Echo 02/2020  1. Moderate ascites is present.  2. Left ventricular ejection fraction, by estimation, is 60 to 65%. The left ventricle has normal function. The left ventricle has no regional wall motion abnormalities. Left ventricular diastolic parameters are consistent with Grade I diastolic dysfunction (impaired relaxation). The average left ventricular global longitudinal strain is -22.3 %. The global longitudinal strain is normal.  3. Right ventricular systolic function is normal. The right ventricular size is normal.  4. The mitral valve is normal in structure. Mild mitral valve  regurgitation. No evidence of mitral stenosis.  5. The aortic valve is normal in structure. Aortic valve regurgitation is not visualized. No aortic stenosis is present.  6. Aortic dilatation noted. There is mild dilatation at the level of the sinuses of Valsalva measuring 41 mm.  7. The inferior vena cava is normal in size with greater than 50% respiratory variability, suggesting right atrial pressure of 3 mmHg.    Neuro/Psych negative neurological ROS  negative psych ROS   GI/Hepatic PUD, GERD  ,(+) Cirrhosis   ascites    , Hepatitis -, C  Endo/Other  negative endocrine ROS  Renal/GU negative Renal ROS     Musculoskeletal negative musculoskeletal ROS (+)   Abdominal   Peds  Hematology  (+) anemia ,   Anesthesia Other Findings cirrhosis  Reproductive/Obstetrics                             Anesthesia Physical  Anesthesia Plan  ASA: III  Anesthesia Plan: General   Post-op Pain Management:     Induction: Intravenous  PONV Risk Score and Plan: 2 and Treatment may vary due to age or medical condition, Ondansetron and Midazolam  Airway Management Planned: Oral ETT  Additional Equipment: Arterial line  Intra-op Plan:   Post-operative Plan: Possible Post-op intubation/ventilation  Informed Consent: I have reviewed the patients History and Physical, chart, labs and discussed the procedure including the risks, benefits and alternatives for the proposed anesthesia with the patient or authorized representative who has indicated his/her understanding and acceptance.     Dental advisory given  Plan Discussed with: CRNA  Anesthesia Plan Comments: (2 x PIV)        Anesthesia Quick Evaluation

## 2020-04-24 ENCOUNTER — Encounter (HOSPITAL_COMMUNITY)
Admission: RE | Disposition: A | Discharge status: Home / Self Care | Payer: Self-pay | Source: Home / Self Care | Attending: Interventional Radiology

## 2020-04-24 ENCOUNTER — Telehealth (HOSPITAL_COMMUNITY): Payer: Self-pay

## 2020-04-24 ENCOUNTER — Other Ambulatory Visit (HOSPITAL_COMMUNITY): Payer: Medicare Other

## 2020-04-24 ENCOUNTER — Ambulatory Visit (HOSPITAL_COMMUNITY)
Admission: RE | Admit: 2020-04-24 | Discharge: 2020-04-24 | Disposition: A | Discharge status: Home / Self Care | Payer: Medicare Other | Source: Ambulatory Visit | Attending: Interventional Radiology | Admitting: Interventional Radiology

## 2020-04-24 ENCOUNTER — Ambulatory Visit (HOSPITAL_COMMUNITY)
Admission: RE | Admit: 2020-04-24 | Discharge: 2020-04-24 | Disposition: A | Discharge status: Home / Self Care | Payer: Medicare Other | Attending: Interventional Radiology | Admitting: Interventional Radiology

## 2020-04-24 DIAGNOSIS — Z20822 Contact with and (suspected) exposure to covid-19: Secondary | ICD-10-CM | POA: Insufficient documentation

## 2020-04-24 DIAGNOSIS — R188 Other ascites: Secondary | ICD-10-CM | POA: Insufficient documentation

## 2020-04-24 DIAGNOSIS — Z5309 Procedure and treatment not carried out because of other contraindication: Secondary | ICD-10-CM | POA: Insufficient documentation

## 2020-04-24 HISTORY — DX: Hypotension, unspecified: I95.9

## 2020-04-24 LAB — SARS CORONAVIRUS 2 BY RT PCR (HOSPITAL ORDER, PERFORMED IN ~~LOC~~ HOSPITAL LAB): SARS Coronavirus 2: NEGATIVE

## 2020-04-24 LAB — PROTIME-INR
INR: 1.2 (ref 0.8–1.2)
Prothrombin Time: 15 seconds (ref 11.4–15.2)

## 2020-04-24 LAB — CBC
HCT: 38.3 % — ABNORMAL LOW (ref 39.0–52.0)
Hemoglobin: 12.3 g/dL — ABNORMAL LOW (ref 13.0–17.0)
MCH: 31.3 pg (ref 26.0–34.0)
MCHC: 32.1 g/dL (ref 30.0–36.0)
MCV: 97.5 fL (ref 80.0–100.0)
Platelets: 207 10*3/uL (ref 150–400)
RBC: 3.93 MIL/uL — ABNORMAL LOW (ref 4.22–5.81)
RDW: 13.9 % (ref 11.5–15.5)
WBC: 6.7 10*3/uL (ref 4.0–10.5)
nRBC: 0 % (ref 0.0–0.2)

## 2020-04-24 LAB — COMPREHENSIVE METABOLIC PANEL
ALT: 25 U/L (ref 0–44)
AST: 53 U/L — ABNORMAL HIGH (ref 15–41)
Albumin: 2.5 g/dL — ABNORMAL LOW (ref 3.5–5.0)
Alkaline Phosphatase: 76 U/L (ref 38–126)
Anion gap: 7 (ref 5–15)
BUN: 10 mg/dL (ref 8–23)
CO2: 25 mmol/L (ref 22–32)
Calcium: 8.5 mg/dL — ABNORMAL LOW (ref 8.9–10.3)
Chloride: 103 mmol/L (ref 98–111)
Creatinine, Ser: 0.76 mg/dL (ref 0.61–1.24)
GFR calc Af Amer: >60 mL/min (ref >60)
GFR calc non Af Amer: >60 mL/min (ref >60)
Glucose, Bld: 90 mg/dL (ref 70–99)
Potassium: 3.8 mmol/L (ref 3.5–5.1)
Sodium: 135 mmol/L (ref 135–145)
Total Bilirubin: 1.5 mg/dL — ABNORMAL HIGH (ref 0.3–1.2)
Total Protein: 7.5 g/dL (ref 6.5–8.1)

## 2020-04-24 SURGERY — IR WITH ANESTHESIA
Anesthesia: General

## 2020-04-24 MED ORDER — LACTATED RINGERS IV SOLN: INTRAVENOUS | Status: DC

## 2020-04-24 MED ORDER — PIPERACILLIN-TAZOBACTAM 3.375 G IVPB
3.3750 g | Freq: Once | INTRAVENOUS | Status: DC
  Filled 2020-04-24: qty 50

## 2020-04-24 MED ORDER — SODIUM CHLORIDE 0.9 % IV SOLN: INTRAVENOUS | Status: DC

## 2020-04-24 NOTE — Progress Notes (Signed)
Patient left short stay while waiting for transportation.  Interpreter informed him transportation would be here soon and he needed to wait.  Unable to get patient to stay.

## 2020-04-24 NOTE — Progress Notes (Signed)
Used bedside interpreter to start pre-op interview prior to starting IV. Patient reports eating a slice of cheese pizza at 0700. Per Dr. Lissa Hoard delay case for 8 hours. Spoke to [place], [place] with radiology and he advised that patient would be cancelled. Lennette Bihari to speak to patient regarding same and provide information about rescheduling.

## 2020-04-24 NOTE — Telephone Encounter (Signed)
Pt could not have his TIPS done today bc pt ate this morning and showed up late. I called up to the floor to reschedule while interpreter was still available. The nurse stated that the pt did not want to reschedule at all. I spoke to the interpreter and he went to speak with the pt. He walked in the room and the pt left and said that he was going walking and did not try and reschedule. AW

## 2020-04-24 NOTE — Progress Notes (Signed)
Pt scheduled for TIPS today. Difficult social situation and language barrier. Once pt finally arrived and interpreter services available, was able to discuss with pt. States he ate pizza this am.  Anesthesia states 8 hour NPO time needed prior to GET. Will have to cancel case today and reschedule. Pt given instructions again via interpreter. Will try to reschedule as soon as possible since had repeat COVID test today.   Ascencion Dike [name] Interventional Radiology 04/24/2020 10:09 AM

## 2020-04-30 ENCOUNTER — Telehealth: Payer: Self-pay | Admitting: Gastroenterology

## 2020-04-30 ENCOUNTER — Ambulatory Visit (HOSPITAL_COMMUNITY): Admission: RE | Admit: 2020-04-30 | Payer: Medicare Other | Source: Ambulatory Visit

## 2020-04-30 NOTE — Telephone Encounter (Signed)
Pt went for tips procedure 7/6 and had eaten, procedure was cancelled. Spoke with IR and Anderson Malta will call Hoyle Sauer the congregational nurse tomorrow to reschedule the procedure. Carolyn aware.

## 2020-05-01 ENCOUNTER — Encounter (HOSPITAL_COMMUNITY): Payer: Self-pay

## 2020-05-01 ENCOUNTER — Emergency Department (HOSPITAL_COMMUNITY)
Admission: EM | Admit: 2020-05-01 | Discharge: 2020-05-01 | Disposition: A | Discharge status: Home / Self Care | Payer: Medicare Other | Attending: Emergency Medicine | Admitting: Emergency Medicine

## 2020-05-01 ENCOUNTER — Telehealth (HOSPITAL_COMMUNITY): Payer: Self-pay | Admitting: Radiology

## 2020-05-01 ENCOUNTER — Other Ambulatory Visit: Payer: Self-pay

## 2020-05-01 DIAGNOSIS — Z5321 Procedure and treatment not carried out due to patient leaving prior to being seen by health care provider: Secondary | ICD-10-CM | POA: Insufficient documentation

## 2020-05-01 DIAGNOSIS — R1031 Right lower quadrant pain: Secondary | ICD-10-CM | POA: Insufficient documentation

## 2020-05-01 LAB — COMPREHENSIVE METABOLIC PANEL
ALT: 25 U/L (ref 0–44)
AST: 50 U/L — ABNORMAL HIGH (ref 15–41)
Albumin: 2.5 g/dL — ABNORMAL LOW (ref 3.5–5.0)
Alkaline Phosphatase: 80 U/L (ref 38–126)
Anion gap: 9 (ref 5–15)
BUN: 6 mg/dL — ABNORMAL LOW (ref 8–23)
CO2: 24 mmol/L (ref 22–32)
Calcium: 8.5 mg/dL — ABNORMAL LOW (ref 8.9–10.3)
Chloride: 105 mmol/L (ref 98–111)
Creatinine, Ser: 0.86 mg/dL (ref 0.61–1.24)
GFR calc Af Amer: >60 mL/min (ref >60)
GFR calc non Af Amer: >60 mL/min (ref >60)
Glucose, Bld: 105 mg/dL — ABNORMAL HIGH (ref 70–99)
Potassium: 3.8 mmol/L (ref 3.5–5.1)
Sodium: 138 mmol/L (ref 135–145)
Total Bilirubin: 2.2 mg/dL — ABNORMAL HIGH (ref 0.3–1.2)
Total Protein: 7.5 g/dL (ref 6.5–8.1)

## 2020-05-01 LAB — CBC
HCT: 39.2 % (ref 39.0–52.0)
Hemoglobin: 12.9 g/dL — ABNORMAL LOW (ref 13.0–17.0)
MCH: 31.7 pg (ref 26.0–34.0)
MCHC: 32.9 g/dL (ref 30.0–36.0)
MCV: 96.3 fL (ref 80.0–100.0)
Platelets: 231 10*3/uL (ref 150–400)
RBC: 4.07 MIL/uL — ABNORMAL LOW (ref 4.22–5.81)
RDW: 13.9 % (ref 11.5–15.5)
WBC: 7.5 10*3/uL (ref 4.0–10.5)
nRBC: 0 % (ref 0.0–0.2)

## 2020-05-01 LAB — LIPASE, BLOOD: Lipase: 43 U/L (ref 11–51)

## 2020-05-01 NOTE — Telephone Encounter (Signed)
Called Berneta Sages, RN (in charge of scheduling for pt), left VM for her to call me back to scheduled TIPS with [facility] JM

## 2020-05-01 NOTE — ED Notes (Signed)
Pt called multiple times, no answer 

## 2020-05-01 NOTE — ED Triage Notes (Signed)
Pt from home with ems for distended abd, RLQ pain and swollen testicles for the past year. Pt seen here for the same but no improvement in symptoms.

## 2020-05-02 ENCOUNTER — Ambulatory Visit (HOSPITAL_COMMUNITY)
Admission: RE | Admit: 2020-05-02 | Discharge: 2020-05-02 | Disposition: A | Discharge status: Home / Self Care | Payer: Medicare Other | Source: Ambulatory Visit | Attending: Gastroenterology | Admitting: Gastroenterology

## 2020-05-02 DIAGNOSIS — N5089 Other specified disorders of the male genital organs: Secondary | ICD-10-CM

## 2020-05-02 DIAGNOSIS — B182 Chronic viral hepatitis C: Secondary | ICD-10-CM

## 2020-05-02 DIAGNOSIS — K7031 Alcoholic cirrhosis of liver with ascites: Secondary | ICD-10-CM | POA: Insufficient documentation

## 2020-05-02 HISTORY — PX: IR PARACENTESIS: IMG2679

## 2020-05-02 IMAGING — US IR PARACENTESIS
1 series · 2 of 2 positions shown · non-contrast
Comparison: none

INDICATION: Patient with history of end-stage liver disease, recurrent ascites.
Request to IR for therapeutic paracentesis.

[Series 1: ir (id) (id)/(id)/(id) ir · 2 of 2 slices shown]
[im 1/2]
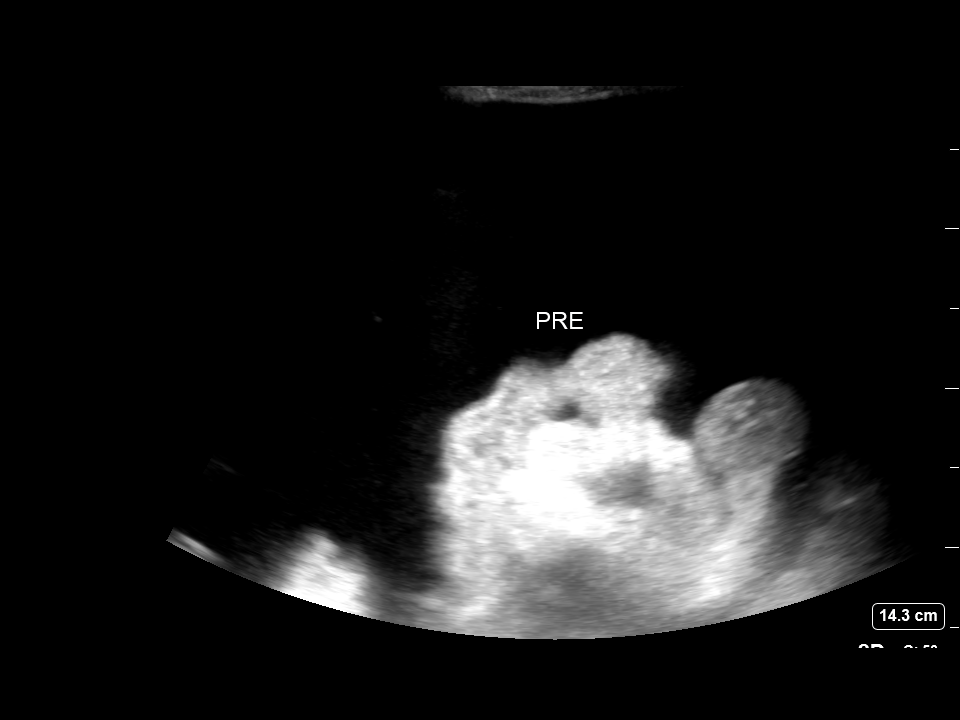
[im 2/2]
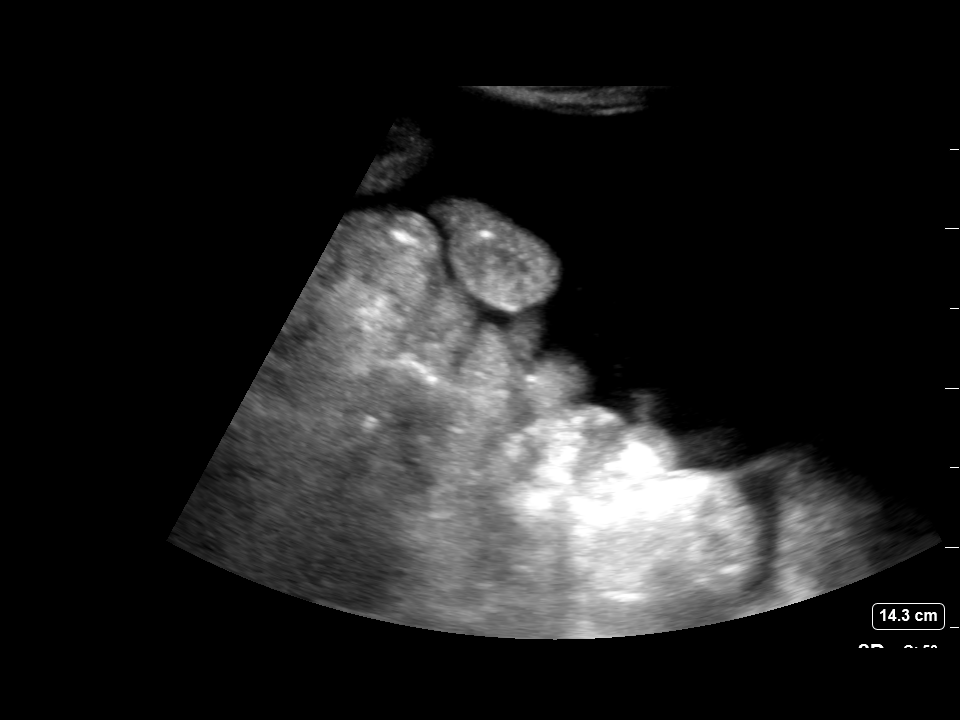

[2 of 2 positions shown; findings below may reference images not displayed]

EXAM:
ULTRASOUND GUIDED THERAPEUTIC PARACENTESIS

MEDICATIONS:
10 mL 1% lidocaine

COMPLICATIONS:
None immediate.

PROCEDURE:
Informed written consent was obtained from the patient after a
discussion of the risks, benefits and alternatives to treatment. A
timeout was performed prior to the initiation of the procedure.

Initial ultrasound scanning demonstrates a large amount of ascites
within the right lower abdominal quadrant. The right lower abdomen
was prepped and draped in the usual sterile fashion. 1% lidocaine
was used for local anesthesia.

Following this, a 19 gauge, 7-cm, Yueh catheter was introduced. An
ultrasound image was saved for documentation purposes. The
paracentesis was performed. The catheter was removed and a dressing
was applied. The patient tolerated the procedure well without
immediate post procedural complication.
Patient received post-procedure intravenous albumin; see nursing
notes for details.
FINDINGS: A total of approximately 7.0 L of clear yellow fluid was removed.
IMPRESSION: Successful ultrasound-guided paracentesis yielding 7.0 liters of
peritoneal fluid.

Read by BRURYA

## 2020-05-02 MED ORDER — ALBUMIN HUMAN 25 % IV SOLN
25.0000 g | Freq: Once | INTRAVENOUS | Status: DC
  Filled 2020-05-02: qty 100

## 2020-05-02 MED ORDER — ALBUMIN HUMAN 25 % IV SOLN
50.0000 g | Freq: Once | INTRAVENOUS | Status: DC
  Filled 2020-05-02: qty 200

## 2020-05-02 MED ORDER — ALBUMIN HUMAN 25 % IV SOLN
INTRAVENOUS | Status: AC
  Filled 2020-05-02: qty 200

## 2020-05-02 MED ORDER — LIDOCAINE HCL (PF) 1 % IJ SOLN
INTRAMUSCULAR | Status: DC | PRN
  Administered 2020-05-02: 10 mL

## 2020-05-02 MED ORDER — LIDOCAINE HCL 1 % IJ SOLN
INTRAMUSCULAR | Status: AC
  Filled 2020-05-02: qty 20

## 2020-05-02 NOTE — Procedures (Signed)
PROCEDURE SUMMARY:  Successful image-guided paracentesis from the right lower abdomen.  Yielded 7.0 liters of clear yellow fluid.  No immediate complications.  EBL: zero Patient tolerated well.   Specimen was not sent for labs.  Please see imaging section of Epic for full dictation.  Joaquim Nam [name] 05/02/2020 2:23 PM

## 2020-05-02 NOTE — Congregational Nurse Program (Signed)
Home visit with interpreter [name].  Informed patient that his surgery has been rescheduled for 05/11/2020 and stressed that he cannot eat after midnight prior to procedure.  Stated he understands.  We also explained that he must get a Covid test on 05/08/2020 at 12 noon and quarantine after testing until his surgery.  Medicaid transportation requested for surgery.  Patient is having a paracentesis today.  [name] RN Congregational Nurse [phone]

## 2020-05-08 ENCOUNTER — Other Ambulatory Visit (HOSPITAL_COMMUNITY)
Admit: 2020-05-08 | Discharge: 2020-05-08 | Disposition: A | Discharge status: Home / Self Care | Payer: Medicare Other | Source: Ambulatory Visit | Attending: Interventional Radiology | Admitting: Interventional Radiology

## 2020-05-08 DIAGNOSIS — Z01812 Encounter for preprocedural laboratory examination: Secondary | ICD-10-CM | POA: Insufficient documentation

## 2020-05-08 DIAGNOSIS — Z20822 Contact with and (suspected) exposure to covid-19: Secondary | ICD-10-CM | POA: Insufficient documentation

## 2020-05-08 LAB — SARS CORONAVIRUS 2 (TAT 6-24 HRS): SARS Coronavirus 2: NEGATIVE

## 2020-05-09 ENCOUNTER — Other Ambulatory Visit: Payer: Self-pay | Admitting: Radiology

## 2020-05-09 ENCOUNTER — Telehealth: Payer: Self-pay | Admitting: Gastroenterology

## 2020-05-09 NOTE — Telephone Encounter (Signed)
Thank you for letting me know. Do not take any of his currently listed meds before the TIPS this week.  We will see how well ascites/edema controlled in the weeks after TIPS to determine if diuretics need to be resumed.

## 2020-05-09 NOTE — Telephone Encounter (Signed)
Spoke with congregational nurse, she went with pt to his appt yesterday to be ready for TIPS procedure on Friday. States she found out that the last time the pt filled any of his pill prescriptions was November and when he finished taking the meds he threw the bottles away. She wanted to know if pt should stay off meds or wait until after Tips on Friday or what Dr. Loletha Carrow recommends. Please advise.

## 2020-05-09 NOTE — Telephone Encounter (Signed)
Berneta Sages congressional nurse for pt would like to speak to nurse regarding pt's medication management. Pt is scheduled for TIPS procedure on Friday.

## 2020-05-09 NOTE — Telephone Encounter (Signed)
Spoke with Hoyle Sauer RN and she is aware and will make sure pt is aware.

## 2020-05-10 ENCOUNTER — Other Ambulatory Visit: Payer: Self-pay | Admitting: Radiology

## 2020-05-10 ENCOUNTER — Telehealth: Payer: Self-pay

## 2020-05-10 NOTE — Progress Notes (Signed)
Donald Aguilar 's preferred number, he did not answer and he does not have voice mail set up.  I called his emergerncy contact: Carmina Miller, no answer and voice mail not set up.  I called patrient's home number, patient answered, he asked what time to arrive and then did not speak in English anymore.  I did tell patient to arrive at 0530, he repeated it.  I requested a Rhade interpreter.

## 2020-05-10 NOTE — Anesthesia Preprocedure Evaluation (Addendum)
Anesthesia Evaluation  Patient identified by MRN, date of birth, ID band Patient awake    Reviewed: Allergy & Precautions, NPO status , Patient's Chart, lab work & pertinent test results  History of Anesthesia Complications Negative for: history of anesthetic complications  Airway Mallampati: III  TM Distance: >3 FB Neck ROM: Full    Dental  (+) Dental Advisory Given, Poor Dentition   Pulmonary Current Smoker and Patient abstained from smoking.,    Pulmonary exam normal        Cardiovascular negative cardio ROS Normal cardiovascular exam   '21 TTE - EF 60 to 65%. Grade I diastolic dysfunction (impaired relaxation). Mild MR. There is mild dilatation at the level of the sinuses of Valsalva measuring 41 mm.    Neuro/Psych negative neurological ROS  negative psych ROS   GI/Hepatic PUD, GERD  Medicated and Controlled,(+) Cirrhosis   ascites    , Hepatitis -, C  Endo/Other  negative endocrine ROS  Renal/GU negative Renal ROS     Musculoskeletal negative musculoskeletal ROS (+)   Abdominal   Peds  Hematology negative hematology ROS (+)   Anesthesia Other Findings Covid test negative   Reproductive/Obstetrics                            Anesthesia Physical Anesthesia Plan  ASA: IV  Anesthesia Plan: General   Post-op Pain Management:    Induction: Intravenous  PONV Risk Score and Plan: 2 and Treatment may vary due to age or medical condition, Ondansetron and Dexamethasone  Airway Management Planned: Oral ETT  Additional Equipment: Arterial line  Intra-op Plan:   Post-operative Plan: Extubation in OR  Informed Consent: I have reviewed the patients History and Physical, chart, labs and discussed the procedure including the risks, benefits and alternatives for the proposed anesthesia with the patient or authorized representative who has indicated his/her understanding and acceptance.      Dental advisory given  Plan Discussed with: CRNA and Anesthesiologist  Anesthesia Plan Comments:        Anesthesia Quick Evaluation

## 2020-05-10 NOTE — Telephone Encounter (Signed)
Patient called to confirm time of tomorrow's appointment, ask about transportation and when he could eat.  Advised patient he has to be at [facility] at 6:00 am and that Medicaid transportation will pick him up between 5:00 and 5:30 am.  Reminded him of written instructions given to him on 7/20 which were translated into his language but again told  him not to eat after midnight. CN will have interpreter [name] call him to repeat this information. [name] RN, Congregational Nurse [phone]

## 2020-05-10 NOTE — Progress Notes (Addendum)
I spoke to Donald Aguilar and he said he was told to be at the hospital at 0600, I said  0530. I now see a note from Donald Aguilar, Congregational nurse that patient will be transferred by Medical Transport. Donald Aguilar said , "nothing to eat or drink after midnight." I instructed patient to shower, brush teeth and to wear clean clothes.

## 2020-05-11 ENCOUNTER — Ambulatory Visit (HOSPITAL_COMMUNITY): Payer: Medicare Other | Admitting: Physician Assistant

## 2020-05-11 ENCOUNTER — Encounter (HOSPITAL_COMMUNITY)
Admission: RE | Disposition: A | Discharge status: Home / Self Care | Payer: Self-pay | Source: Home / Self Care | Attending: Interventional Radiology

## 2020-05-11 ENCOUNTER — Observation Stay (HOSPITAL_COMMUNITY)
Admission: RE | Admit: 2020-05-11 | Discharge: 2020-05-12 | Disposition: A | Discharge status: Home / Self Care | Payer: Medicare Other | Attending: Interventional Radiology | Admitting: Interventional Radiology

## 2020-05-11 ENCOUNTER — Ambulatory Visit (HOSPITAL_COMMUNITY)
Admission: RE | Admit: 2020-05-11 | Discharge: 2020-05-11 | Disposition: A | Discharge status: Home / Self Care | Payer: Medicare Other | Source: Ambulatory Visit | Attending: Interventional Radiology | Admitting: Interventional Radiology

## 2020-05-11 ENCOUNTER — Other Ambulatory Visit: Payer: Self-pay

## 2020-05-11 DIAGNOSIS — B182 Chronic viral hepatitis C: Secondary | ICD-10-CM

## 2020-05-11 DIAGNOSIS — K7460 Unspecified cirrhosis of liver: Secondary | ICD-10-CM | POA: Diagnosis not present

## 2020-05-11 DIAGNOSIS — K7469 Other cirrhosis of liver: Secondary | ICD-10-CM

## 2020-05-11 DIAGNOSIS — R188 Other ascites: Principal | ICD-10-CM | POA: Diagnosis present

## 2020-05-11 DIAGNOSIS — K219 Gastro-esophageal reflux disease without esophagitis: Secondary | ICD-10-CM | POA: Insufficient documentation

## 2020-05-11 DIAGNOSIS — K741 Hepatic sclerosis: Secondary | ICD-10-CM | POA: Diagnosis not present

## 2020-05-11 DIAGNOSIS — Z79899 Other long term (current) drug therapy: Secondary | ICD-10-CM | POA: Diagnosis not present

## 2020-05-11 DIAGNOSIS — F17210 Nicotine dependence, cigarettes, uncomplicated: Secondary | ICD-10-CM | POA: Diagnosis not present

## 2020-05-11 DIAGNOSIS — B1920 Unspecified viral hepatitis C without hepatic coma: Secondary | ICD-10-CM | POA: Insufficient documentation

## 2020-05-11 HISTORY — PX: IR PARACENTESIS: IMG2679

## 2020-05-11 HISTORY — PX: RADIOLOGY WITH ANESTHESIA: SHX6223

## 2020-05-11 HISTORY — PX: IR TIPS: IMG2295

## 2020-05-11 LAB — COMPREHENSIVE METABOLIC PANEL
ALT: 27 U/L (ref 0–44)
AST: 57 U/L — ABNORMAL HIGH (ref 15–41)
Albumin: 2.4 g/dL — ABNORMAL LOW (ref 3.5–5.0)
Alkaline Phosphatase: 66 U/L (ref 38–126)
Anion gap: 8 (ref 5–15)
BUN: 11 mg/dL (ref 8–23)
CO2: 24 mmol/L (ref 22–32)
Calcium: 8.4 mg/dL — ABNORMAL LOW (ref 8.9–10.3)
Chloride: 105 mmol/L (ref 98–111)
Creatinine, Ser: 0.81 mg/dL (ref 0.61–1.24)
GFR calc Af Amer: >60 mL/min (ref >60)
GFR calc non Af Amer: >60 mL/min (ref >60)
Glucose, Bld: 99 mg/dL (ref 70–99)
Potassium: 3.5 mmol/L (ref 3.5–5.1)
Sodium: 137 mmol/L (ref 135–145)
Total Bilirubin: 1.7 mg/dL — ABNORMAL HIGH (ref 0.3–1.2)
Total Protein: 7.3 g/dL (ref 6.5–8.1)

## 2020-05-11 LAB — PROTIME-INR
INR: 1.2 (ref 0.8–1.2)
Prothrombin Time: 14.5 seconds (ref 11.4–15.2)

## 2020-05-11 LAB — CBC
HCT: 39.3 % (ref 39.0–52.0)
Hemoglobin: 12.4 g/dL — ABNORMAL LOW (ref 13.0–17.0)
MCH: 30.5 pg (ref 26.0–34.0)
MCHC: 31.6 g/dL (ref 30.0–36.0)
MCV: 96.8 fL (ref 80.0–100.0)
Platelets: 193 10*3/uL (ref 150–400)
RBC: 4.06 MIL/uL — ABNORMAL LOW (ref 4.22–5.81)
RDW: 13.9 % (ref 11.5–15.5)
WBC: 5.9 10*3/uL (ref 4.0–10.5)
nRBC: 0 % (ref 0.0–0.2)

## 2020-05-11 LAB — PREPARE RBC (CROSSMATCH)

## 2020-05-11 LAB — ABO/RH: ABO/RH(D): A POS

## 2020-05-11 IMAGING — XA IR TIPS
8 of 9 series · 13 of 24 positions shown · non-contrast
Comparison: none

CLINICAL DATA: Cirrhosis, portal venous hypertension, recurrent
large volume abdominal ascites requiring multiple percutaneous
paracentesis procedures.

[Series 1: ir tips · 1 of 5 slices shown]
[im 1/5]
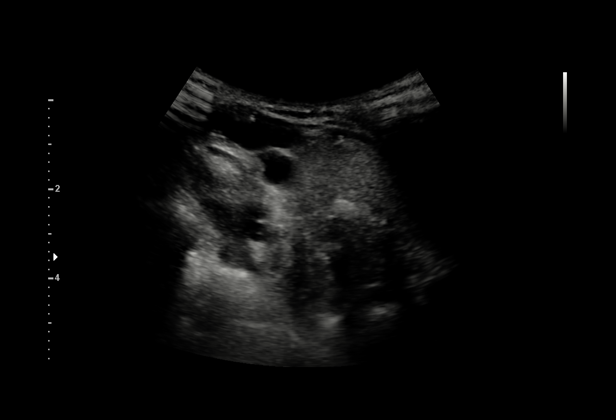

[Series 1: body 4 care · 2 of 9 slices shown (1 of 6)]
[im 1/9]
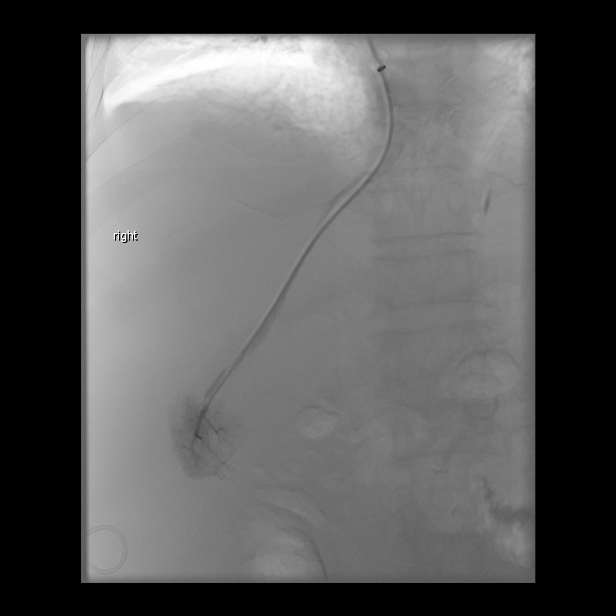
[im 6/9]
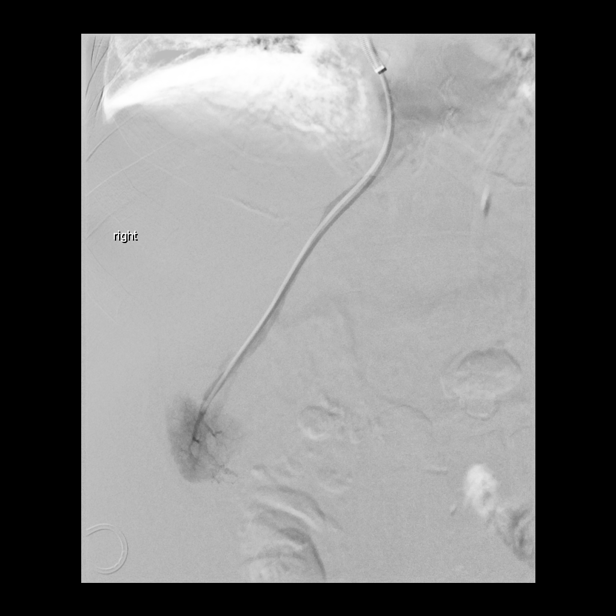

[Series 2: body 4 care · 2 of 7 slices shown (2 of 6)]
[im 1/7]
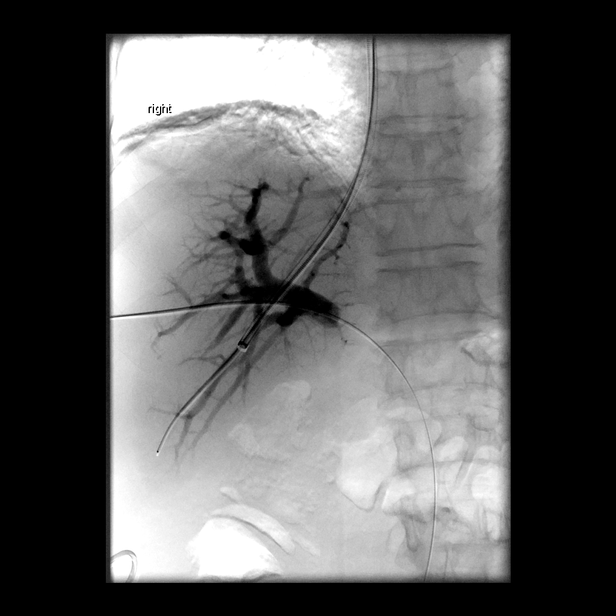
[im 7/7]
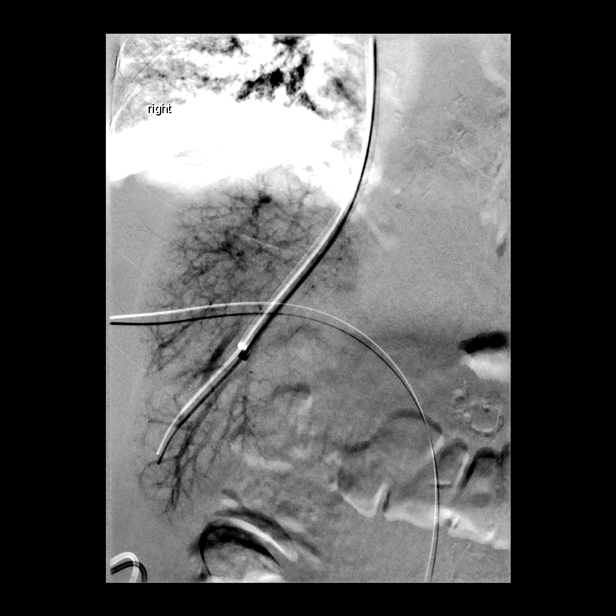

[Series 8: body 4 care · 1 of 6 slices shown (3 of 6)]
[im 3/6]
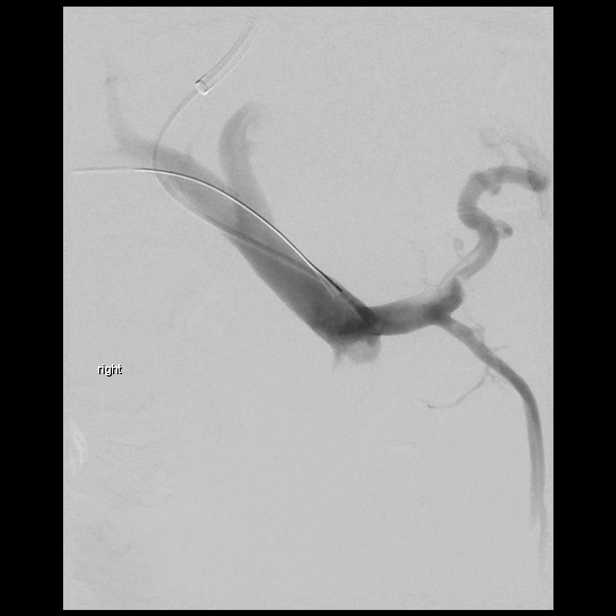

[Series 9: fl (-) angio · 1 of 1 slices shown]
[im 1/1]
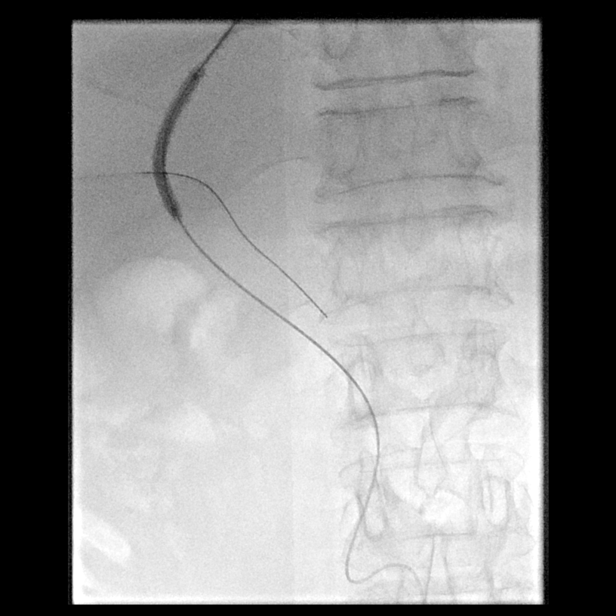

[Series 10: body 4 care · 3 of 9 slices shown (4 of 6)]
[im 1/9]
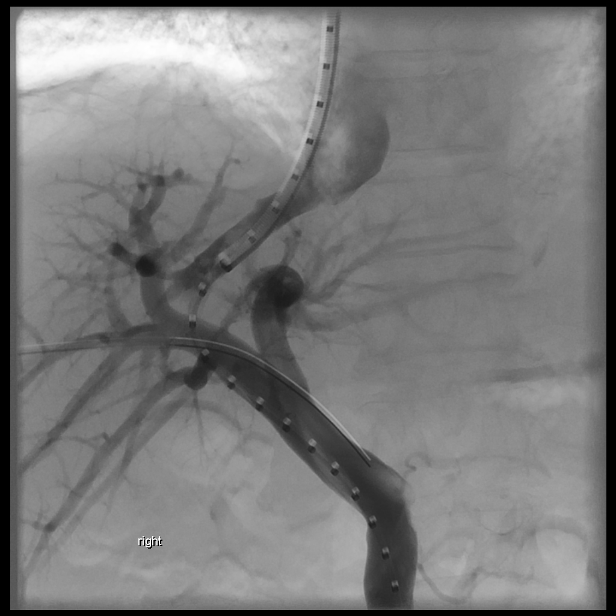
[im 5/9]
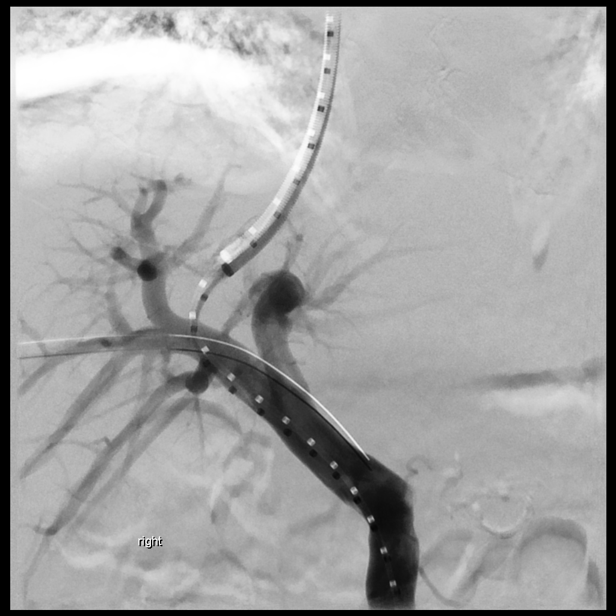
[im 9/9]
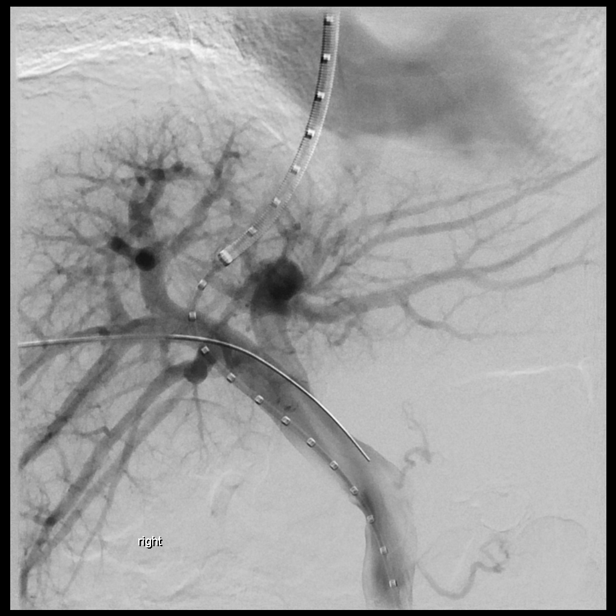

[Series 12: body 4 care · 1 of 1 slices shown (5 of 6)]
[im 1/1]
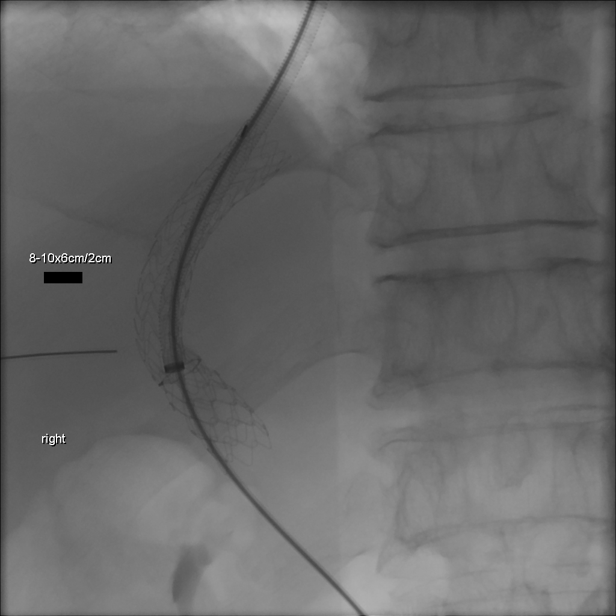

[Series 13: body 4 care · 2 of 8 slices shown (6 of 6)]
[im 3/8]
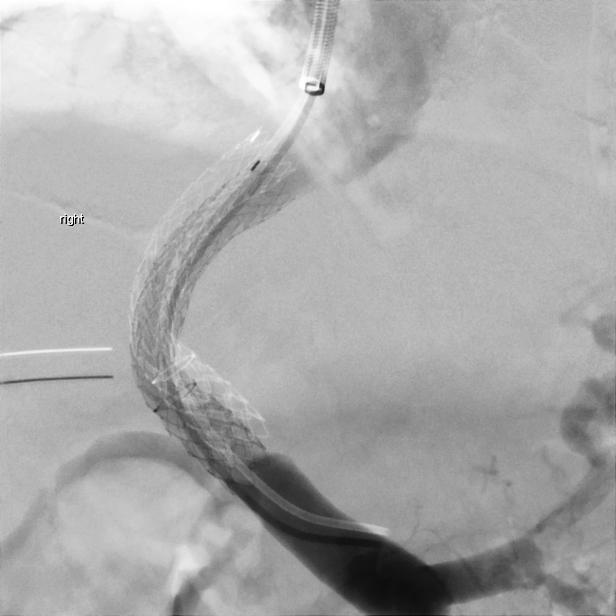
[im 8/8]
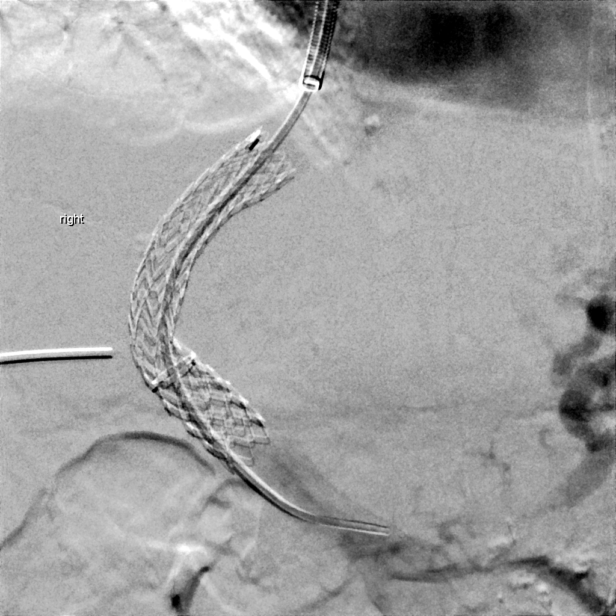

[13 of 24 positions shown; findings below may reference images not displayed]

EXAM:
1. TIPS CREATION
2. ULTRASOUND-GUIDED VENOUS ACCESS

ANESTHESIA/SEDATION:
General - as administered by the Anesthesia department

MEDICATIONS:
Cefazolin 2 g IV within 1 hour of procedure start

FLUOROSCOPY TIME:  25 minutes 30 seconds; 158 mGy

COMPLICATIONS:
None immediate.

PROCEDURE:
Informed written consent was obtained from the patient with the aid
of interpreter after a
thorough discussion of the procedural risks, benefits and
alternatives. All questions were addressed. See previous
consultation. Maximal Sterile Barrier
Technique was utilized including caps, mask, sterile gowns, sterile
gloves, sterile drape, hand hygiene and skin antiseptic. A timeout
was performed prior to the initiation of the procedure.

The skin overlying the right upper abdominal quadrant as well as the
right neck  region were prepped and draped in usual
sterile fashion. Paracentesis was initiated preprocedure, dictated
separately.

Under direct ultrasound guidance, a peripheral aspect of the right
portal
vein was accessed with a 21 gauge micropuncture needle. Ultrasound
image was saved
for procedural documentation purposes. Guidewire advanced into the
main portal vein. Micropuncture transitional dilator was advanced
over the guidewire, with the radiopaque transition
positioned at the target entrance to the peripheral right portal
vein.

Next, the right internal jugular vein was accessed under direct
ultrasound. Ultrasound image was saved for procedural documentation
purposes. This allowed for placement of the 10 French TIPS vascular
sheath.

With the aid of angiographic guidewires, an MPA catheter was
utilized to select the right hepatic vein and a hepatic venogram was
performed.  Sheath advanced into the right hepatic vein.

Under fluoroscopic guidance, the right portal vein with targeted
with ERXLEBEN TIPS needle directed at the radiopaque target
within the right portal vein. Several passes before and after
modification needle angle would not reach the portal vein.
Therefore, the middle hepatic vein was catheterized. Catheter
position confirmed on ultrasound. From this approach, the right
portal vein was
accessed at a desirable location allowing advancement of a stiff
Glidewire into the main portal vein. A 4 French glide catheter was
advanced over the stiff Glidewire and a portal venogram was
Performed.
Next,   the track was dilated with a 4 mm Mustang
Balloon, allowing advancement of the measuring pigtail into the main
portal vein. A portal venogram was performed with a measuring Omni
Flush catheter.

The sheath was advanced into the main portal vein. The
percutaneous portal microcatheter and wire were removed.

Next, a 2 cm (un covered) x 6 cm (covered) x 10 mm diameter ERXLEBEN
VIATORR
TIPS Endoprosthesis was advanced through the sheath across the intra
hepatic track and
deployed. The TIPS stent was angioplastied in multiple stations to
8 mm diameter.

Follow-up portal venogram demonstrated good stent deployment and
flow, no residual
stenosis. Enlarged coronary vein collateral channels were
identified. Pressure measurements were obtained demonstrating no
portosystemic gradient. Wires, catheters and sheaths were removed
from the patient.
Hemostasis was achieved at the right IJ access site with
manual compression. A dressing was placed.

The patient tolerated the procedure well. Patient transferred to the
PACU.
IMPRESSION: 1. Technically successful TIPS creation from right portal vein to
middle hepatic vein, with elimination of portosystemic gradient.

## 2020-05-11 SURGERY — IR WITH ANESTHESIA
Anesthesia: General

## 2020-05-11 MED ORDER — LACTATED RINGERS IV SOLN: INTRAVENOUS | Status: DC | PRN

## 2020-05-11 MED ORDER — MIDAZOLAM HCL 2 MG/2ML IJ SOLN
INTRAMUSCULAR | Status: AC
  Filled 2020-05-11: qty 2

## 2020-05-11 MED ORDER — FENTANYL CITRATE (PF) 250 MCG/5ML IJ SOLN
INTRAMUSCULAR | Status: DC | PRN
  Administered 2020-05-11 (×3): 25 ug via INTRAVENOUS
  Administered 2020-05-11: 50 ug via INTRAVENOUS

## 2020-05-11 MED ORDER — BISACODYL 5 MG PO TBEC: 5.0000 mg | DELAYED_RELEASE_TABLET | Freq: Every day | ORAL | Status: DC | PRN

## 2020-05-11 MED ORDER — SENNOSIDES-DOCUSATE SODIUM 8.6-50 MG PO TABS: 1.0000 | ORAL_TABLET | Freq: Every evening | ORAL | Status: DC | PRN

## 2020-05-11 MED ORDER — SUGAMMADEX SODIUM 200 MG/2ML IV SOLN
INTRAVENOUS | Status: DC | PRN
  Administered 2020-05-11: 200 mg via INTRAVENOUS

## 2020-05-11 MED ORDER — SODIUM CHLORIDE 0.9 % IV SOLN: INTRAVENOUS | Status: DC

## 2020-05-11 MED ORDER — IBUPROFEN 400 MG PO TABS: 400.0000 mg | ORAL_TABLET | Freq: Four times a day (QID) | ORAL | Status: DC | PRN

## 2020-05-11 MED ORDER — ALBUMIN HUMAN 5 % IV SOLN
INTRAVENOUS | Status: DC | PRN
Start: 2020-05-11 — End: 2020-05-11

## 2020-05-11 MED ORDER — IOHEXOL 300 MG/ML  SOLN
100.0000 mL | Freq: Once | INTRAMUSCULAR | Status: AC | PRN
  Administered 2020-05-11: 20 mL via INTRAVENOUS

## 2020-05-11 MED ORDER — FENTANYL CITRATE (PF) 100 MCG/2ML IJ SOLN: 25.0000 ug | INTRAMUSCULAR | Status: DC | PRN

## 2020-05-11 MED ORDER — OXYCODONE HCL 5 MG/5ML PO SOLN: 5.0000 mg | Freq: Once | ORAL | Status: DC | PRN

## 2020-05-11 MED ORDER — ONDANSETRON HCL 4 MG/2ML IJ SOLN: 4.0000 mg | Freq: Once | INTRAMUSCULAR | Status: DC | PRN

## 2020-05-11 MED ORDER — FENTANYL CITRATE (PF) 100 MCG/2ML IJ SOLN
INTRAMUSCULAR | Status: AC
  Filled 2020-05-11: qty 2

## 2020-05-11 MED ORDER — MIDAZOLAM HCL 2 MG/2ML IJ SOLN
INTRAMUSCULAR | Status: DC | PRN
  Administered 2020-05-11 (×2): .5 mg via INTRAVENOUS

## 2020-05-11 MED ORDER — ONDANSETRON HCL 4 MG PO TABS: 4.0000 mg | ORAL_TABLET | Freq: Four times a day (QID) | ORAL | Status: DC | PRN

## 2020-05-11 MED ORDER — CHLORHEXIDINE GLUCONATE 0.12 % MT SOLN
OROMUCOSAL | Status: AC
  Administered 2020-05-11: 15 mL
  Filled 2020-05-11: qty 15

## 2020-05-11 MED ORDER — ONDANSETRON HCL 4 MG/2ML IJ SOLN: 4.0000 mg | Freq: Four times a day (QID) | INTRAMUSCULAR | Status: DC | PRN

## 2020-05-11 MED ORDER — OXYCODONE HCL 5 MG PO TABS: 5.0000 mg | ORAL_TABLET | Freq: Once | ORAL | Status: DC | PRN

## 2020-05-11 MED ORDER — PROPOFOL 10 MG/ML IV BOLUS
INTRAVENOUS | Status: DC | PRN
  Administered 2020-05-11: 50 mg via INTRAVENOUS

## 2020-05-11 MED ORDER — PHENYLEPHRINE 40 MCG/ML (10ML) SYRINGE FOR IV PUSH (FOR BLOOD PRESSURE SUPPORT)
PREFILLED_SYRINGE | INTRAVENOUS | Status: DC | PRN
  Administered 2020-05-11: 80 ug via INTRAVENOUS

## 2020-05-11 MED ORDER — DEXAMETHASONE SODIUM PHOSPHATE 10 MG/ML IJ SOLN
INTRAMUSCULAR | Status: DC | PRN
  Administered 2020-05-11: 10 mg via INTRAVENOUS

## 2020-05-11 MED ORDER — PHENYLEPHRINE HCL-NACL 10-0.9 MG/250ML-% IV SOLN
INTRAVENOUS | Status: DC | PRN
  Administered 2020-05-11: 15 ug/min via INTRAVENOUS

## 2020-05-11 MED ORDER — IOHEXOL 300 MG/ML  SOLN
150.0000 mL | Freq: Once | INTRAMUSCULAR | Status: AC | PRN
  Administered 2020-05-11: 75 mL via INTRAVENOUS

## 2020-05-11 MED ORDER — CEFAZOLIN SODIUM-DEXTROSE 2-4 GM/100ML-% IV SOLN
INTRAVENOUS | Status: AC
  Filled 2020-05-11: qty 100

## 2020-05-11 MED ORDER — LIDOCAINE 2% (20 MG/ML) 5 ML SYRINGE
INTRAMUSCULAR | Status: DC | PRN
  Administered 2020-05-11: 40 mg via INTRAVENOUS

## 2020-05-11 MED ORDER — CEFAZOLIN SODIUM-DEXTROSE 2-4 GM/100ML-% IV SOLN
2.0000 g | Freq: Once | INTRAVENOUS | Status: AC
  Administered 2020-05-11: 2 g via INTRAVENOUS

## 2020-05-11 MED ORDER — ONDANSETRON HCL 4 MG/2ML IJ SOLN
INTRAMUSCULAR | Status: DC | PRN
  Administered 2020-05-11: 4 mg via INTRAVENOUS

## 2020-05-11 MED ORDER — HYDROCODONE-ACETAMINOPHEN 5-325 MG PO TABS
1.0000 | ORAL_TABLET | ORAL | Status: DC | PRN
  Administered 2020-05-11: 2 via ORAL
  Filled 2020-05-11: qty 2

## 2020-05-11 MED ORDER — ZOLPIDEM TARTRATE 5 MG PO TABS: 5.0000 mg | ORAL_TABLET | Freq: Every evening | ORAL | Status: DC | PRN

## 2020-05-11 MED ORDER — ROCURONIUM BROMIDE 10 MG/ML (PF) SYRINGE
PREFILLED_SYRINGE | INTRAVENOUS | Status: DC | PRN
  Administered 2020-05-11: 30 mg via INTRAVENOUS
  Administered 2020-05-11: 10 mg via INTRAVENOUS
  Administered 2020-05-11: 30 mg via INTRAVENOUS
  Administered 2020-05-11 (×2): 10 mg via INTRAVENOUS

## 2020-05-11 NOTE — Sedation Documentation (Signed)
Post Pressures Portal Vein 20 mean Right atrial pressure 20 mean

## 2020-05-11 NOTE — Anesthesia Postprocedure Evaluation (Signed)
Anesthesia Post Note  Patient: Donald Aguilar  Procedure(s) Performed: IR WITH ANESTHESIA TRASJUGULAR INTRAHEPATIC PORTOSYSTEMIC SHUNT (N/A )     Patient location during evaluation: PACU Anesthesia Type: General Level of consciousness: awake and alert Pain management: pain level controlled Vital Signs Assessment: post-procedure vital signs reviewed and stable Respiratory status: spontaneous breathing, nonlabored ventilation and respiratory function stable Cardiovascular status: blood pressure returned to baseline and stable Postop Assessment: no apparent nausea or vomiting Anesthetic complications: no   No complications documented.  Last Vitals:  Vitals:   05/11/20 1145 05/11/20 1200  BP: 104/74 103/75  Pulse: 92 88  Resp: 16 13  Temp:    SpO2: 97% 94%    Last Pain:  Vitals:   05/11/20 1200  TempSrc:   PainSc: 0-No pain                 Audry Pili

## 2020-05-11 NOTE — Anesthesia Procedure Notes (Deleted)
Performed by: Darletta Moll, CRNA

## 2020-05-11 NOTE — H&P (Deleted)
  The note originally documented on this encounter has been moved the the encounter in which it belongs.  

## 2020-05-11 NOTE — Transfer of Care (Signed)
Immediate Anesthesia Transfer of Care Note  Patient: Donald Aguilar  Procedure(s) Performed: IR WITH ANESTHESIA TRASJUGULAR INTRAHEPATIC PORTOSYSTEMIC SHUNT (N/A )  Patient Location: PACU  Anesthesia Type:General  Level of Consciousness: awake and patient cooperative  Airway & Oxygen Therapy: Patient Spontanous Breathing and Patient connected to face mask oxygen  Post-op Assessment: Report given to RN, Post -op Vital signs reviewed and stable and Patient moving all extremities  Post vital signs: Reviewed and stable  Last Vitals:  Vitals Value Taken Time  BP 102/68 05/11/20 1130  Temp 36.7 C 05/11/20 1130  Pulse 87 05/11/20 1136  Resp 13 05/11/20 1136  SpO2 100 % 05/11/20 1136  Vitals shown include unvalidated device data.  Last Pain:  Vitals:   05/11/20 1130  TempSrc:   PainSc: Asleep         Complications: No complications documented.

## 2020-05-11 NOTE — Sedation Documentation (Signed)
Patient transported to PACU by CRNAs. Wound sites assessed prior to leaving. No drainage noted from each site.

## 2020-05-11 NOTE — Progress Notes (Signed)
Interpretor at beside for duration on recovery.

## 2020-05-11 NOTE — Anesthesia Procedure Notes (Signed)
Procedure Name: Intubation Date/Time: 05/11/2020 8:38 AM Performed by: Darletta Moll, CRNA Pre-anesthesia Checklist: Patient identified, Emergency Drugs available, Suction available and Patient being monitored Patient Re-evaluated:Patient Re-evaluated prior to induction Oxygen Delivery Method: Circle system utilized Preoxygenation: Pre-oxygenation with 100% oxygen Tube type: Oral Tube size: 8.0 mm Number of attempts: 1 Airway Equipment and Method: Bougie stylet Placement Confirmation: ETT inserted through vocal cords under direct vision,  positive ETCO2 and breath sounds checked- equal and bilateral Secured at: 26 cm Tube secured with: Tape Dental Injury: Teeth and Oropharynx as per pre-operative assessment

## 2020-05-11 NOTE — Anesthesia Procedure Notes (Addendum)
Procedure Name: Intubation Date/Time: 05/11/2020 8:26 AM Performed by: Darletta Moll, CRNA Pre-anesthesia Checklist: Patient identified, Emergency Drugs available, Suction available and Patient being monitored Patient Re-evaluated:Patient Re-evaluated prior to induction Oxygen Delivery Method: Circle system utilized Preoxygenation: Pre-oxygenation with 100% oxygen Induction Type: IV induction Ventilation: Two handed mask ventilation required and Oral airway inserted - appropriate to patient size Laryngoscope Size: Mac and 4 Grade View: Grade II Tube type: Oral Tube size: 7.5 mm Number of attempts: 1 Airway Equipment and Method: Stylet and Oral airway Placement Confirmation: ETT inserted through vocal cords under direct vision,  positive ETCO2 and breath sounds checked- equal and bilateral Secured at: 21 cm Tube secured with: Tape Dental Injury: Teeth and Oropharynx as per pre-operative assessment  Comments: Intubated by Levert Feinstein, SRNA

## 2020-05-11 NOTE — Anesthesia Procedure Notes (Addendum)
Arterial Line Insertion Start/End7/23/2021 7:40 AM Performed by: Roberts Gaudy, MD, anesthesiologist  Patient location: Pre-op. Preanesthetic checklist: patient identified, IV checked, site marked, risks and benefits discussed, surgical consent, monitors and equipment checked, pre-op evaluation, timeout performed and anesthesia consent Lidocaine 1% used for infiltration Left, brachial was placed Catheter size: 20 Fr Hand hygiene performed  and maximum sterile barriers used   Attempts: 1 Procedure performed using ultrasound guided technique. Ultrasound Notes:anatomy identified, needle tip was noted to be adjacent to the nerve/plexus identified and no ultrasound evidence of intravascular and/or intraneural injection Following insertion, dressing applied and Biopatch. Post procedure assessment: normal and unchanged  Patient tolerated the procedure well with no immediate complications.

## 2020-05-11 NOTE — Procedures (Signed)
  Procedure: TIPS, Paracentesis   EBL:   minimal Complications:  none immediate  See full dictation in [facility].  Dillard Cannon MD Main # [phone] Pager  [phone]

## 2020-05-11 NOTE — Sedation Documentation (Signed)
Timeout: Image guided transjugular intrahepatic portosystemic shunt and paracentesis

## 2020-05-11 NOTE — H&P (Signed)
Chief Complaint: Patient was seen in consultation today for TIPS procedure   Supervising Physician: Arne Cleveland  Patient Status: [facility] - Out-pt  History of Present Illness: Donald Aguilar is a 69 y.o. male with hx of cirrhosis and refractory ascites. Has had numerous paracentesis and was eventually consulted for TIPS procedure. He was felt to be good candidate and is now scheduled for procedure today. Live interpreter present Pt feels well this am. No recent fever chills, illness. Last para was 7/14 Has been NPO as instructed.  Past Medical History:  Diagnosis Date  . 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 GERD (gastroesophageal reflux disease)   . Hepatitis C    Genotype 1a  . Hypotension     Past Surgical History:  Procedure Laterality Date  . BIOPSY  09/13/2019   Procedure: BIOPSY;  Surgeon: Ladene Artist, MD;  Location: [facility] ENDOSCOPY;  Service: Endoscopy;;  . ESOPHAGOGASTRODUODENOSCOPY (EGD) WITH PROPOFOL N/A 09/13/2019   Procedure: ESOPHAGOGASTRODUODENOSCOPY (EGD) WITH PROPOFOL;  Surgeon: Ladene Artist, MD;  Location: [facility] ENDOSCOPY;  Service: Endoscopy;  Laterality: N/A;  . IR PARACENTESIS  09/12/2019  . IR PARACENTESIS  09/29/2019  . IR PARACENTESIS  10/19/2019  . IR PARACENTESIS  11/02/2019  . IR PARACENTESIS  11/10/2019  . IR PARACENTESIS  11/22/2019  . IR PARACENTESIS  12/06/2019  . IR PARACENTESIS  12/16/2019  . IR PARACENTESIS  12/23/2019  . IR PARACENTESIS  12/30/2019  . IR PARACENTESIS  01/06/2020  . IR PARACENTESIS  01/16/2020  . IR PARACENTESIS  02/01/2020  . IR PARACENTESIS  02/08/2020  . IR PARACENTESIS  02/28/2020  . IR PARACENTESIS  03/23/2020  . IR PARACENTESIS  04/17/2020  . IR PARACENTESIS  05/02/2020  . IR RADIOLOGIST EVAL & MGMT  03/20/2020    Allergies: Patient has no known allergies.  Medications: Prior to Admission medications   Medication Sig Start Date End Date Taking? Authorizing Provider  furosemide (LASIX) 40 MG tablet Take  1 tablet (40 mg total) by mouth daily. Patient not taking: Reported on 05/04/2020 02/24/20   Doran Stabler, MD  midodrine (PROAMATINE) 5 MG tablet Take 1 tablet (5 mg total) by mouth 3 (three) times daily with meals. Patient not taking: Reported on 04/11/2020 12/14/19   Doran Stabler, MD  pantoprazole (PROTONIX) 40 MG tablet Take 1 tablet by mouth twice daily Patient not taking: Reported on 04/11/2020 02/17/20   Esterwood, Amy S, [name]  potassium chloride SA (KLOR-CON) 20 MEQ tablet Take 2 tablets by mouth once daily Patient not taking: Reported on 04/11/2020 02/14/20   Doran Stabler, MD  spironolactone (ALDACTONE) 50 MG tablet Take 1 tablet (50 mg total) by mouth daily. Patient not taking: Reported on 05/04/2020 02/24/20   Doran Stabler, MD     Family History  Problem Relation Age of Onset  . Liver cancer Neg Hx     Social History   Socioeconomic History  . Marital status: Married    Spouse name: Not on file  . Number of children: Not on file  . Years of education: Not on file  . Highest education level: Not on file  Occupational History  . Not on file  Tobacco Use  . Smoking status: Current Every Day Smoker    Packs/day: 0.30    Years: 50.00    Pack years: 15.00    Types: Cigarettes  . Smokeless tobacco: Never Used  . Tobacco comment: approximately a couple of  packs a week  Substance and Sexual Activity  . Alcohol use: Not Currently    Comment: last drank in September, 2020  . Drug use: Never  . Sexual activity: Not on file  Other Topics Concern  . Not on file  Social History Narrative   Leave with 3 friends   Smoke 2 cigarette  a day   Social Determinants of Health   Financial Resource Strain:   . Difficulty of Paying Living Expenses:   Food Insecurity:   . Worried About Charity fundraiser in the Last Year:   . Arboriculturist in the Last Year:   Transportation Needs:   . Film/video editor (Medical):   Marland Kitchen Lack of Transportation (Non-Medical):     Physical Activity:   . Days of Exercise per Week:   . Minutes of Exercise per Session:   Stress:   . Feeling of Stress :   Social Connections:   . Frequency of Communication with Friends and Family:   . Frequency of Social Gatherings with Friends and Family:   . Attends Religious Services:   . Active Member of Clubs or Organizations:   . Attends Archivist Meetings:   Marland Kitchen Marital Status:     Review of Systems: A 12 point ROS discussed and pertinent positives are indicated in the HPI above.  All other systems are negative.  Review of Systems  Vital Signs: There were no vitals taken for this visit.  Physical Exam Constitutional:      Appearance: Normal appearance. He is not ill-appearing.  HENT:     Mouth/Throat:     Mouth: Mucous membranes are moist.     Pharynx: Oropharynx is clear.  Cardiovascular:     Rate and Rhythm: Normal rate and regular rhythm.     Heart sounds: Normal heart sounds.  Pulmonary:     Effort: Pulmonary effort is normal. No respiratory distress.     Breath sounds: Normal breath sounds.  Abdominal:     General: There is distension.     Palpations: Abdomen is soft.     Tenderness: There is no abdominal tenderness. There is no guarding.  Skin:    General: Skin is warm.  Neurological:     General: No focal deficit present.     Mental Status: He is alert and oriented to person, place, and time.  Psychiatric:        Mood and Affect: Mood normal.        Thought Content: Thought content normal.        Judgment: Judgment normal.     Imaging: IR Paracentesis  Result Date: 05/02/2020 INDICATION: Patient with history of end-stage liver disease, recurrent ascites. Request to IR for therapeutic paracentesis. EXAM: ULTRASOUND GUIDED THERAPEUTIC PARACENTESIS MEDICATIONS: 10 mL 1% lidocaine COMPLICATIONS: None immediate. PROCEDURE: Informed written consent was obtained from the patient after a discussion of the risks, benefits and alternatives to  treatment. A timeout was performed prior to the initiation of the procedure. Initial ultrasound scanning demonstrates a large amount of ascites within the right lower abdominal quadrant. The right lower abdomen was prepped and draped in the usual sterile fashion. 1% lidocaine was used for local anesthesia. Following this, a 19 gauge, 7-cm, Yueh catheter was introduced. An ultrasound image was saved for documentation purposes. The paracentesis was performed. The catheter was removed and a dressing was applied. The patient tolerated the procedure well without immediate post procedural complication. Patient received post-procedure intravenous albumin; see nursing  notes for details. FINDINGS: A total of approximately 7.0 L of clear yellow fluid was removed. IMPRESSION: Successful ultrasound-guided paracentesis yielding 7.0 liters of peritoneal fluid. Read by Candiss Norse, [name] Electronically Signed   By: [name] M.D.   On: 05/02/2020 15:07   IR Paracentesis  Result Date: 04/17/2020 INDICATION: Patient with a history of end-stage liver disease and recurrent large volume ascites. Interventional radiology asked to perform a therapeutic paracentesis. EXAM: ULTRASOUND GUIDED PARACENTESIS MEDICATIONS: 1% lidocaine 10 mL COMPLICATIONS: None immediate PROCEDURE: Informed written consent was obtained from the patient after a discussion of the risks, benefits and alternatives to treatment. A timeout was performed prior to the initiation of the procedure. Initial ultrasound scanning demonstrates a large amount of ascites within the right lower abdominal quadrant. The right lower abdomen was prepped and draped in the usual sterile fashion. 1% lidocaine was used for local anesthesia. Following this, a 19 gauge, 7-cm, Yueh catheter was introduced. An ultrasound image was saved for documentation purposes. The paracentesis was performed. The catheter was removed and a dressing was applied. The patient tolerated the  procedure well without immediate post procedural complication. Patient received post-procedure intravenous albumin; see nursing notes for details. FINDINGS: A total of approximately 7.1 L of clear yellow fluid was removed. IMPRESSION: Successful ultrasound-guided paracentesis yielding 7.1 liters of peritoneal fluid. Read by: Soyla Dryer, NP Electronically Signed   By: Corrie Mckusick D.O.   On: 04/17/2020 13:52    Labs:  CBC: Recent Labs    01/30/20 0919 04/24/20 0855 05/01/20 0925 05/11/20 0634  WBC 7.2 6.7 7.5 5.9  HGB 12.1* 12.3* 12.9* 12.4*  HCT 35.2* 38.3* 39.2 39.3  PLT 266.0 207 231 193    COAGS: Recent Labs    11/02/19 0903 01/30/20 0919 04/24/20 0855 05/11/20 0634  INR 1.6* 1.2* 1.2 1.2    BMP: Recent Labs    11/02/19 0903 11/08/19 1043 02/24/20 0934 04/24/20 0855 05/01/20 0925 05/11/20 0634  NA 133*   < > 134* 135 138 137  K 3.0*   < > 3.7 3.8 3.8 3.5  CL 95*   < > 104 103 105 105  CO2 28   < > 27 25 24 24   GLUCOSE 107*   < > 87 90 105* 99  BUN 6*   < > 8 10 6* 11  CALCIUM 8.0*   < > 8.5 8.5* 8.5* 8.4*  CREATININE 0.69   < > 0.75 0.76 0.86 0.81  GFRNONAA >60  --   --  >60 >60 >60  GFRAA >60  --   --  >60 >60 >60   < > = values in this interval not displayed.    LIVER FUNCTION TESTS: Recent Labs    01/30/20 0919 04/24/20 0855 05/01/20 0925 05/11/20 0634  BILITOT 1.5* 1.5* 2.2* 1.7*  AST 48* 53* 50* 57*  ALT 24 25 25 27   ALKPHOS 109 76 80 66  PROT 7.8 7.5 7.5 7.3  ALBUMIN 2.6* 2.5* 2.5* 2.4*    TUMOR MARKERS: No results for input(s): AFPTM, CEA, CA199, CHROMGRNA in the last 8760 hours.  Assessment and Plan: Cirrhosis with refractory ascites Plan for paracentesis and TIPS today under general anesthesia. Labs reviewed. Risks and benefits of TIPS, BRTO and/or additional variceal embolization were discussed with the patient and/or the patient's family including, but not limited to, infection, bleeding, damage to adjacent structures,  worsening hepatic and/or cardiac function, worsening and/or the development of altered mental status/encephalopathy, non-target embolization and death.  This interventional procedure involves the use of X-rays and because of the nature of the planned procedure, it is possible that we will have prolonged use of X-ray fluoroscopy.  Potential radiation risks to you include (but are not limited to) the following: - A slightly elevated risk for cancer  several years later in life. This risk is typically less than 0.5% percent. This risk is low in comparison to the normal incidence of human cancer, which is 33% for women and 50% for men according to the [place]. - Radiation induced injury can include skin redness, resembling a rash, tissue breakdown / ulcers and hair loss (which can be temporary or permanent).   The likelihood of either of these occurring depends on the difficulty of the procedure and whether you are sensitive to radiation due to previous procedures, disease, or genetic conditions.   IF your procedure requires a prolonged use of radiation, you will be notified and given written instructions for further action.  It is your responsibility to monitor the irradiated area for the 2 weeks following the procedure and to notify your physician if you are concerned that you have suffered a radiation induced injury.    All of the patient's questions were answered, patient is agreeable to proceed. Pt will be admitted after procedure for observation. Anticipate discharge home tomorrow Consent signed and in chart.     Thank you for this interesting consult.  I greatly enjoyed meeting Donald Aguilar and look forward to participating in their care.  A copy of this report was sent to the requesting provider on this date.  Electronically Signed: Ascencion Dike, [name] 05/11/2020, 7:33 AM   I spent a total of 25 minutes in face to face in clinical consultation, greater than 50% of  which was counseling/coordinating care for TIPS

## 2020-05-12 DIAGNOSIS — R188 Other ascites: Secondary | ICD-10-CM | POA: Diagnosis not present

## 2020-05-12 LAB — AMMONIA: Ammonia: 29 umol/L (ref 9–35)

## 2020-05-12 MED ORDER — LACTULOSE 10 GM/15ML PO SOLN: 10.0000 g | Freq: Two times a day (BID) | ORAL | 2 refills | Status: DC

## 2020-05-12 MED ORDER — CHLORHEXIDINE GLUCONATE CLOTH 2 % EX PADS
6.0000 | MEDICATED_PAD | Freq: Every day | CUTANEOUS | Status: DC
  Administered 2020-05-12: 6 via TOPICAL

## 2020-05-12 NOTE — Discharge Summary (Signed)
Patient ID: Donald Aguilar MRN: [id number] DOB/AGE: Dec 03, 1950 69 y.o.  Admit date: 05/11/2020 Discharge date: 05/12/2020  Supervising Physician: Arne Cleveland  Patient Status: [facility] - In-pt  Admission Diagnoses: Cirrhosis with recurrent ascites  Discharge Diagnoses:  Active Problems:   Other ascites   Discharged Condition: good - patient choosing to leave AMA  ** This is an updated discharge summary from previously filed note on 7/24 at 11:35 am due to [facility] in [facility]: [facility] Aguilar presented to [facility] yesterday as an outpatient for a previously planned TIPS placement, the procedure went successfully without complication and patient was admitted for overnight observation and was planned for discharge earlier today. Myself and IR NP Roselyn Reef presented to patient's bedside this morning to discuss discharge to home and instructions which he was agreeable to at that time, however message received from bedside RN this afternoon that patient was now irate and refusing to leave. We again presented to the patient's room at 1340 to discuss his concerns - using remote [place] interpreter Wele patient's RN, myself and IR NP Roselyn Reef attempted to discuss what was upsetting him. Donald Aguilar stated that he was told yesterday that he "would be staying until 5 am tomorrow morning and that all day today everyone has been telling him to leave." He denies any pain, n/v and only c/o some itching at RUQ puncture site. He ate a full lunch tray, urinated post foley removal and was ambulating independently. Shortly after stating this patient became irate and promptly exited the bed and forcefully removed his left upper extremity IV causing him to bleed on himself, the bed, the floor and his belongings. He then walked towards [place], NP and bedside RN in a threatening manner before turning to sit in the arm chair and get dressed. He refused to speak to anyone, including the  interpreter, during this time. He did allow Roselyn Reef, NP to remove his right upper extremity IV and hold pressure. He would not allow me to discuss discharge paperwork or paper prescriptions - he declined to take paperwork or prescriptions with him, stated to patient that we will call him for follow up appointment however he did not acknowledge my statement. He declined to discuss need for ride voucher or to call anyone to pick him up. Per discussion with Dr. Vernard Gambles via phone no indication currently for patient to stay in hospital and to discuss with [facility]. Discussed with [facility] who stated this is to be counted as patient leaving [place]. Patient ambulated without difficulty and exited 6N via elevator independently to the second floor at approximately 1405.   Consults: None  Significant Diagnostic Studies: IR Tips  Result Date: 05/11/2020 CLINICAL DATA:  Cirrhosis, portal venous hypertension, recurrent large volume abdominal ascites requiring multiple percutaneous paracentesis procedures. EXAM: 1. TIPS CREATION 2. ULTRASOUND-GUIDED VENOUS ACCESS ANESTHESIA/SEDATION: General - as administered by the Anesthesia department MEDICATIONS: Cefazolin 2 g IV within 1 hour of procedure start FLUOROSCOPY TIME:  25 minutes 30 seconds; 546 mGy COMPLICATIONS: None immediate. PROCEDURE: Informed written consent was obtained from the patient with the aid of interpreter after a thorough discussion of the procedural risks, benefits and alternatives. All questions were addressed. See previous consultation. Maximal Sterile Barrier Technique was utilized including caps, mask, sterile gowns, sterile gloves, sterile drape, hand hygiene and skin antiseptic. A timeout was performed prior to the initiation of the procedure. The skin overlying the right upper abdominal quadrant as well as the right neck  region were prepped and draped in usual sterile fashion. Paracentesis was initiated preprocedure, dictated separately. Under  direct ultrasound guidance, a peripheral aspect of the right portal vein was accessed with a 21 gauge micropuncture needle. Ultrasound image was saved for procedural documentation purposes. Guidewire advanced into the main portal vein. Micropuncture transitional dilator was advanced over the guidewire, with the radiopaque transition positioned at the target entrance to the peripheral right portal vein. Next, the right internal jugular vein was accessed under direct ultrasound. Ultrasound image was saved for procedural documentation purposes. This allowed for placement of the 10 French TIPS vascular sheath. With the aid of angiographic guidewires, an MPA catheter was utilized to select the right hepatic vein and a hepatic venogram was performed.  Sheath advanced into the right hepatic vein. Under fluoroscopic guidance, the right portal vein with targeted with a Rsch-Uchida TIPS needle directed at the radiopaque target within the right portal vein. Several passes before and after modification needle angle would not reach the portal vein. Therefore, the middle hepatic vein was catheterized. Catheter position confirmed on ultrasound. From this approach, the right portal vein was accessed at a desirable location allowing advancement of a stiff Glidewire into the main portal vein. A 4 French glide catheter was advanced over the stiff Glidewire and a portal venogram was Performed. Next,   the track was dilated with a 4 mm Mustang Balloon, allowing advancement of the measuring pigtail into the main portal vein. A portal venogram was performed with a measuring Omni Flush catheter. The sheath was advanced into the main portal vein. The percutaneous portal microcatheter and wire were removed. Next, a 2 cm (un covered) x 6 cm (covered) x 10 mm diameter GORE VIATORR TIPS Endoprosthesis was advanced through the sheath across the intra hepatic track and deployed. The TIPS stent was angioplastied in multiple stations to 8 mm  diameter. Follow-up portal venogram demonstrated good stent deployment and flow, no residual stenosis. Enlarged coronary vein collateral channels were identified. Pressure measurements were obtained demonstrating no portosystemic gradient. Wires, catheters and sheaths were removed from the patient. Hemostasis was achieved at the right IJ access site with manual compression. A dressing was placed. The patient tolerated the procedure well. Patient transferred to the PACU. IMPRESSION: 1. Technically successful TIPS creation from right portal vein to middle hepatic vein, with elimination of portosystemic gradient. Electronically Signed   By: Lucrezia Europe M.D.   On: 05/11/2020 17:20   IR Paracentesis  Result Date: 05/11/2020 INDICATION: Cirrhosis with recurrent large volume symptomatic abdominal ascites. Paracentesis required pre tips creation. EXAM: ULTRASOUND GUIDED  PARACENTESIS MEDICATIONS: None. COMPLICATIONS: None immediate. PROCEDURE: Informed written consent was obtained from the patient after a discussion of the risks, benefits and alternatives to treatment. A timeout was performed prior to the initiation of the procedure. Initial ultrasound scanning demonstrates a large amount of ascites within the peritoneum. The right lateral abdomen was prepped and draped in the usual sterile fashion. 1% lidocaine was used for local anesthesia. Following this, a 6 Fr Safe-T-Centesis catheter was introduced. An ultrasound image was saved for documentation purposes. The paracentesis was performed. The catheter was removed and a dressing was applied. The patient tolerated the procedure well without immediate post procedural complication. Patient received post-procedure intravenous albumin; see nursing notes for details. FINDINGS: A total of approximately 10.7 L of slightly cloudy yellow fluid was removed. IMPRESSION: Successful ultrasound-guided paracentesis yielding 10.7 liters of peritoneal fluid. Electronically Signed    By: Lucrezia Europe M.D.   On:  05/11/2020 17:05   IR Paracentesis  Result Date: 05/02/2020 INDICATION: Patient with history of end-stage liver disease, recurrent ascites. Request to IR for therapeutic paracentesis. EXAM: ULTRASOUND GUIDED THERAPEUTIC PARACENTESIS MEDICATIONS: 10 mL 1% lidocaine COMPLICATIONS: None immediate. PROCEDURE: Informed written consent was obtained from the patient after a discussion of the risks, benefits and alternatives to treatment. A timeout was performed prior to the initiation of the procedure. Initial ultrasound scanning demonstrates a large amount of ascites within the right lower abdominal quadrant. The right lower abdomen was prepped and draped in the usual sterile fashion. 1% lidocaine was used for local anesthesia. Following this, a 19 gauge, 7-cm, Yueh catheter was introduced. An ultrasound image was saved for documentation purposes. The paracentesis was performed. The catheter was removed and a dressing was applied. The patient tolerated the procedure well without immediate post procedural complication. Patient received post-procedure intravenous albumin; see nursing notes for details. FINDINGS: A total of approximately 7.0 L of clear yellow fluid was removed. IMPRESSION: Successful ultrasound-guided paracentesis yielding 7.0 liters of peritoneal fluid. Read by Candiss Norse, [name] Electronically Signed   By: [name] M.D.   On: 05/02/2020 15:07   IR Paracentesis  Result Date: 04/17/2020 INDICATION: Patient with a history of end-stage liver disease and recurrent large volume ascites. Interventional radiology asked to perform a therapeutic paracentesis. EXAM: ULTRASOUND GUIDED PARACENTESIS MEDICATIONS: 1% lidocaine 10 mL COMPLICATIONS: None immediate PROCEDURE: Informed written consent was obtained from the patient after a discussion of the risks, benefits and alternatives to treatment. A timeout was performed prior to the initiation of the procedure. Initial ultrasound  scanning demonstrates a large amount of ascites within the right lower abdominal quadrant. The right lower abdomen was prepped and draped in the usual sterile fashion. 1% lidocaine was used for local anesthesia. Following this, a 19 gauge, 7-cm, Yueh catheter was introduced. An ultrasound image was saved for documentation purposes. The paracentesis was performed. The catheter was removed and a dressing was applied. The patient tolerated the procedure well without immediate post procedural complication. Patient received post-procedure intravenous albumin; see nursing notes for details. FINDINGS: A total of approximately 7.1 L of clear yellow fluid was removed. IMPRESSION: Successful ultrasound-guided paracentesis yielding 7.1 liters of peritoneal fluid. Read by: Soyla Dryer, NP Electronically Signed   By: Corrie Mckusick D.O.   On: 04/17/2020 13:52    Treatments: IV hydration  Discharge Exam: Blood pressure (!) 95/60, pulse 79, temperature 97.9 F (36.6 C), temperature source Oral, resp. rate 18, height 5\' 5"  (1.651 m), weight 147 lb 0.8 oz (66.7 kg), SpO2 97 %.   Patient declined repeat physical exam - leaving AMA.   Disposition: Discharge disposition: 01-Home or Self Care        Allergies as of 05/12/2020   No Known Allergies     Medication List    TAKE these medications   furosemide 40 MG tablet Commonly known as: Lasix Take 1 tablet (40 mg total) by mouth daily.   lactulose 10 GM/15ML solution Commonly known as: CHRONULAC Take 15 mLs (10 g total) by mouth 2 (two) times daily.   midodrine 5 MG tablet Commonly known as: PROAMATINE Take 1 tablet (5 mg total) by mouth 3 (three) times daily with meals.   pantoprazole 40 MG tablet Commonly known as: PROTONIX Take 1 tablet by mouth twice daily   potassium chloride SA 20 MEQ tablet Commonly known as: KLOR-CON Take 2 tablets by mouth once daily   spironolactone 50 MG tablet Commonly known as:  Aldactone Take 1 tablet (50  mg total) by mouth daily.       Follow-up Information    Arne Cleveland, MD Follow up in 1 month(s).   Specialties: Interventional Radiology, Radiology Why: IR scheduler will call you with appointment date/time. Please call with any questions or concerns prior to your appointment.  Contact information: [address] [place] [address] [phone]                Electronically Signed: Joaquim Nam, [name] 05/12/2020, 2:05 PM   I have spent Greater Than 30 Minutes discharging Donald Aguilar.

## 2020-05-12 NOTE — Progress Notes (Signed)
PT Cancellation Note  Patient Details Name: Donald Aguilar MRN: [id number] DOB: 01-15-1951   Cancelled Treatment:    Reason Eval/Treat Not Completed: Other (comment) (Pt left AMA prior to PT eval)   Melvern Banker 05/12/2020, 2:30 PM

## 2020-05-12 NOTE — Progress Notes (Signed)
This nurse in pt room to explain discharge around 1230. Pt became upset and it was determined through use of language line that pt did not want to be discharged because he didn't feel ready. Interpreter explained that there was a dialect issue with interpretation.  Pt then stated he was ok to be discharged. Several minutes later pt bed was alarming, pt was trying to get up because he was again upset that he was being "forced out". Through the interpreter it was explained to pt that he was not being forced out and that this nurse was glad to take his concerns to the doctors. Pt agreed.  At around 1315, MD updated with PT concerns. PA called and advised that she would come to see pt and asked this nurse to be in room. While PA was attempting to speak with pt about his concerns through the interpreter, he be irate and got out of bed, ripped his IV out and began to put on his street clothes. He refused all attempts to educate him on his discharge and left floor.

## 2020-05-12 NOTE — Discharge Summary (Signed)
Patient ID: Donald Aguilar MRN: [id number] DOB/AGE: September 04, 1951 69 y.o.  Admit date: 05/11/2020 Discharge date: 05/12/2020  Supervising Physician: Arne Cleveland  Patient Status: [facility] - In-pt  Admission Diagnoses: Cirrhosis with recurrent ascites  Discharge Diagnoses:  Active Problems:   Other ascites   Discharged Condition: good  Hospital Course: Donald Aguilar presented to [facility] yesterday as an outpatient for a planned transjugular intrahepatic portosystemic shunt placement and therapeutic paracentesis with Dr. Vernard Gambles. The procedure went successfully without complication and he was admitted for overnight observation. Per RN no concerns overnight, foley removed this morning without issue. [place] interpreter # [phone] used for conversation today - he denies any complaints this morning except that he is hungry and waiting for his lunch to be delivered and he is very ready to go home. He has not had any nausea, vomiting, abdominal pain or confusion. We discussed starting lactulose 10 gm/15 mL PO BID as well as following up with [place] in 1 month at IR clinic for a TIPS [place] and provider follow up - he states understanding to both and denies any questions today.   Consults: None  Significant Diagnostic Studies: IR Tips  Result Date: 05/11/2020 CLINICAL DATA:  Cirrhosis, portal venous hypertension, recurrent large volume abdominal ascites requiring multiple percutaneous paracentesis procedures. EXAM: 1. TIPS CREATION 2. ULTRASOUND-GUIDED VENOUS ACCESS ANESTHESIA/SEDATION: General - as administered by the Anesthesia department MEDICATIONS: Cefazolin 2 g IV within 1 hour of procedure start FLUOROSCOPY TIME:  25 minutes 30 seconds; 194 mGy COMPLICATIONS: None immediate. PROCEDURE: Informed written consent was obtained from the patient with the aid of interpreter after a thorough discussion of the procedural risks, benefits and alternatives. All questions were addressed. See previous consultation. Maximal  Sterile Barrier Technique was utilized including caps, mask, sterile gowns, sterile gloves, sterile drape, hand hygiene and skin antiseptic. A timeout was performed prior to the initiation of the procedure. The skin overlying the right upper abdominal quadrant as well as the right neck  region were prepped and draped in usual sterile fashion. Paracentesis was initiated preprocedure, dictated separately. Under direct ultrasound guidance, a peripheral aspect of the right portal vein was accessed with a 21 gauge micropuncture needle. Ultrasound image was saved for procedural documentation purposes. Guidewire advanced into the main portal vein. Micropuncture transitional dilator was advanced over the guidewire, with the radiopaque transition positioned at the target entrance to the peripheral right portal vein. Next, the right internal jugular vein was accessed under direct ultrasound. Ultrasound image was saved for procedural documentation purposes. This allowed for placement of the 10 French TIPS vascular sheath. With the aid of angiographic guidewires, an MPA catheter was utilized to select the right hepatic vein and a hepatic venogram was performed.  Sheath advanced into the right hepatic vein. Under fluoroscopic guidance, the right portal vein with targeted with a Rsch-Uchida TIPS needle directed at the radiopaque target within the right portal vein. Several passes before and after modification needle angle would not reach the portal vein. Therefore, the middle hepatic vein was catheterized. Catheter position confirmed on ultrasound. From this approach, the right portal vein was accessed at a desirable location allowing advancement of a stiff Glidewire into the main portal vein. A 4 French glide catheter was advanced over the stiff Glidewire and a portal venogram was Performed. Next,   the track was dilated with a 4 mm Mustang Balloon, allowing advancement of the measuring pigtail into the main portal vein. A  portal venogram was performed with a measuring Omni Flush catheter.  The sheath was advanced into the main portal vein. The percutaneous portal microcatheter and wire were removed. Next, a 2 cm (un covered) x 6 cm (covered) x 10 mm diameter GORE VIATORR TIPS Endoprosthesis was advanced through the sheath across the intra hepatic track and deployed. The TIPS stent was angioplastied in multiple stations to 8 mm diameter. Follow-up portal venogram demonstrated good stent deployment and flow, no residual stenosis. Enlarged coronary vein collateral channels were identified. Pressure measurements were obtained demonstrating no portosystemic gradient. Wires, catheters and sheaths were removed from the patient. Hemostasis was achieved at the right IJ access site with manual compression. A dressing was placed. The patient tolerated the procedure well. Patient transferred to the PACU. IMPRESSION: 1. Technically successful TIPS creation from right portal vein to middle hepatic vein, with elimination of portosystemic gradient. Electronically Signed   By: Lucrezia Europe M.D.   On: 05/11/2020 17:20   IR Paracentesis  Result Date: 05/11/2020 INDICATION: Cirrhosis with recurrent large volume symptomatic abdominal ascites. Paracentesis required pre tips creation. EXAM: ULTRASOUND GUIDED  PARACENTESIS MEDICATIONS: None. COMPLICATIONS: None immediate. PROCEDURE: Informed written consent was obtained from the patient after a discussion of the risks, benefits and alternatives to treatment. A timeout was performed prior to the initiation of the procedure. Initial ultrasound scanning demonstrates a large amount of ascites within the peritoneum. The right lateral abdomen was prepped and draped in the usual sterile fashion. 1% lidocaine was used for local anesthesia. Following this, a 6 Fr Safe-T-Centesis catheter was introduced. An ultrasound image was saved for documentation purposes. The paracentesis was performed. The catheter was  removed and a dressing was applied. The patient tolerated the procedure well without immediate post procedural complication. Patient received post-procedure intravenous albumin; see nursing notes for details. FINDINGS: A total of approximately 10.7 L of slightly cloudy yellow fluid was removed. IMPRESSION: Successful ultrasound-guided paracentesis yielding 10.7 liters of peritoneal fluid. Electronically Signed   By: Lucrezia Europe M.D.   On: 05/11/2020 17:05   IR Paracentesis  Result Date: 05/02/2020 INDICATION: Patient with history of end-stage liver disease, recurrent ascites. Request to IR for therapeutic paracentesis. EXAM: ULTRASOUND GUIDED THERAPEUTIC PARACENTESIS MEDICATIONS: 10 mL 1% lidocaine COMPLICATIONS: None immediate. PROCEDURE: Informed written consent was obtained from the patient after a discussion of the risks, benefits and alternatives to treatment. A timeout was performed prior to the initiation of the procedure. Initial ultrasound scanning demonstrates a large amount of ascites within the right lower abdominal quadrant. The right lower abdomen was prepped and draped in the usual sterile fashion. 1% lidocaine was used for local anesthesia. Following this, a 19 gauge, 7-cm, Yueh catheter was introduced. An ultrasound image was saved for documentation purposes. The paracentesis was performed. The catheter was removed and a dressing was applied. The patient tolerated the procedure well without immediate post procedural complication. Patient received post-procedure intravenous albumin; see nursing notes for details. FINDINGS: A total of approximately 7.0 L of clear yellow fluid was removed. IMPRESSION: Successful ultrasound-guided paracentesis yielding 7.0 liters of peritoneal fluid. Read by Candiss Norse, [name] Electronically Signed   By: [name] M.D.   On: 05/02/2020 15:07   IR Paracentesis  Result Date: 04/17/2020 INDICATION: Patient with a history of end-stage liver disease and  recurrent large volume ascites. Interventional radiology asked to perform a therapeutic paracentesis. EXAM: ULTRASOUND GUIDED PARACENTESIS MEDICATIONS: 1% lidocaine 10 mL COMPLICATIONS: None immediate PROCEDURE: Informed written consent was obtained from the patient after a discussion of the risks, benefits and alternatives to treatment.  A timeout was performed prior to the initiation of the procedure. Initial ultrasound scanning demonstrates a large amount of ascites within the right lower abdominal quadrant. The right lower abdomen was prepped and draped in the usual sterile fashion. 1% lidocaine was used for local anesthesia. Following this, a 19 gauge, 7-cm, Yueh catheter was introduced. An ultrasound image was saved for documentation purposes. The paracentesis was performed. The catheter was removed and a dressing was applied. The patient tolerated the procedure well without immediate post procedural complication. Patient received post-procedure intravenous albumin; see nursing notes for details. FINDINGS: A total of approximately 7.1 L of clear yellow fluid was removed. IMPRESSION: Successful ultrasound-guided paracentesis yielding 7.1 liters of peritoneal fluid. Read by: Soyla Dryer, NP Electronically Signed   By: Corrie Mckusick D.O.   On: 04/17/2020 13:52    Treatments: IV hydration  Discharge Exam: Blood pressure (!) 95/60, pulse 79, temperature 97.9 F (36.6 C), temperature source Oral, resp. rate 18, height 5\' 5"  (1.651 m), weight 147 lb 0.8 oz (66.7 kg), SpO2 97 %. Physical Exam Vitals and nursing note reviewed.  Constitutional:      General: He is not in acute distress. HENT:     Head: Normocephalic.  Cardiovascular:     Rate and Rhythm: Normal rate and regular rhythm.  Pulmonary:     Effort: Pulmonary effort is normal.     Breath sounds: Normal breath sounds.  Abdominal:     General: There is no distension.     Palpations: Abdomen is soft.     Tenderness: There is no abdominal  tenderness.     Comments: (+) RUQ and right IJ puncture sites clean, dry, dressed appropriately without bleeding or discharge.  Skin:    General: Skin is warm and dry.  Neurological:     Mental Status: He is alert.     Disposition: Discharge disposition: 01-Home or Self Care        Allergies as of 05/12/2020   No Known Allergies     Medication List    TAKE these medications   furosemide 40 MG tablet Commonly known as: Lasix Take 1 tablet (40 mg total) by mouth daily.   lactulose 10 GM/15ML solution Commonly known as: CHRONULAC Take 15 mLs (10 g total) by mouth 2 (two) times daily.   midodrine 5 MG tablet Commonly known as: PROAMATINE Take 1 tablet (5 mg total) by mouth 3 (three) times daily with meals.   pantoprazole 40 MG tablet Commonly known as: PROTONIX Take 1 tablet by mouth twice daily   potassium chloride SA 20 MEQ tablet Commonly known as: KLOR-CON Take 2 tablets by mouth once daily   spironolactone 50 MG tablet Commonly known as: Aldactone Take 1 tablet (50 mg total) by mouth daily.       Follow-up Information    Arne Cleveland, MD Follow up in 1 month(s).   Specialties: Interventional Radiology, Radiology Why: IR scheduler will call you with appointment date/time. Please call with any questions or concerns prior to your appointment.  Contact information: [address] [place] [address] [phone]                Electronically Signed: Joaquim Nam, [name] 05/12/2020, 11:35 AM   I have spent Less Than 30 Minutes discharging Donald Aguilar.

## 2020-05-12 NOTE — Plan of Care (Signed)

## 2020-05-12 NOTE — Progress Notes (Signed)
Pt foley removed per order.

## 2020-05-13 LAB — TYPE AND SCREEN
ABO/RH(D): A POS
Antibody Screen: NEGATIVE
Unit division: 0
Unit division: 0
Unit division: 0
Unit division: 0

## 2020-05-13 LAB — BPAM RBC
Blood Product Expiration Date: 202108182359
Blood Product Expiration Date: 202108182359
Blood Product Expiration Date: 202108182359
Blood Product Expiration Date: 202108182359
Unit Type and Rh: 6200
Unit Type and Rh: 6200
Unit Type and Rh: 6200
Unit Type and Rh: 6200

## 2020-05-14 ENCOUNTER — Encounter (HOSPITAL_COMMUNITY): Payer: Self-pay | Admitting: Interventional Radiology

## 2020-05-15 ENCOUNTER — Telehealth: Payer: Self-pay

## 2020-05-15 NOTE — Telephone Encounter (Signed)
Transition Care Management Follow-up Telephone Call  Call completed with assistance of [place] interpreter [facility] from  [employer].  Leodis Liverpool is familiar with the patient.    Date of discharge and from where: 05/12/2020, [facility]  - left AMA per discharge summary  How have you been since you were released from the hospital? He patient's only concern is that he has a rash and is itching all over his body. He said this is not a food allergy.  He denied any chest pain, difficulty breathing and said it is only a rash.  He said multiple times that he did not want to go to Urgent Care or ED. This CM reviewed when to seek medical attention in the ED and said again that he did not want to go.  Informed him that his PCP would be notified of his concern.   Any questions or concerns?  noted above.  Items Reviewed:  Did the pt receive and understand the discharge instructions provided?  he did not receive anything , left AMA  Medications obtained and verified?  he said he has all of the medications that were ordered in the past, nothing new.  No questions about meds at this time  Any new allergies since your discharge?  none reported.  Patient complaining of rash - noted above  Do you have support at home?  has roommate  No home health or DME ordered  Functional Questionnaire: (I = Independent and D = Dependent) ADLs: independent but said he is not able to walk outside. He can walk short distances indoors. Left hospital prior to being seen by PT  Follow up appointments reviewed:   PCP Hospital f/u appt confirmed? Juluis Mire, NP 05/17/2020 @ [facility] f/u appt confirmed? none scheduled at this time  Will need follow up in 1 month with IR  Are transportation arrangements needed?  the interpreter assisted with answering this question as he knows the patient.  He said that the patient uses [employer] [employer] that is arranged by the congregational nurse. He said that  the patient has the congregational nurse's phone number   If their condition worsens, is the pt aware to call PCP or go to the Emergency Dept.?  yes, but he did not have anything to write down the number for PCP at [facility].   He was instructed to call 911 if emergency.  Also instructed to call his congregational nurse if needed assistance with contacting PCP.  Was the patient provided with contact information for the PCP's office or ED?  see above  Was to pt encouraged to call back with questions or concerns?  Yes, explained that he can have the congregational nurse call this office.

## 2020-05-17 ENCOUNTER — Ambulatory Visit (INDEPENDENT_AMBULATORY_CARE_PROVIDER_SITE_OTHER): Payer: Medicare Other | Admitting: Primary Care

## 2020-05-18 ENCOUNTER — Telehealth: Payer: Self-pay

## 2020-05-18 NOTE — Telephone Encounter (Addendum)
Phone call from interpreter Kathyrn Drown who stated patient called him this morning to state that he missed his appointment yesterday at [place] because no one picked him up.  Patient does not understand how to arrange Medicaid transportation and did not notify this CN of appointment.  CN scheduled new appointment for August 10,2021 at 2:10 pm and will request transportation and notify patient.  [name] RN, Congregational Nurse [phone]

## 2020-05-21 ENCOUNTER — Other Ambulatory Visit: Payer: Self-pay | Admitting: Interventional Radiology

## 2020-05-21 DIAGNOSIS — B182 Chronic viral hepatitis C: Secondary | ICD-10-CM

## 2020-05-21 DIAGNOSIS — K7469 Other cirrhosis of liver: Secondary | ICD-10-CM

## 2020-05-24 ENCOUNTER — Telehealth: Payer: Self-pay | Admitting: Gastroenterology

## 2020-05-24 NOTE — Telephone Encounter (Signed)
Thanks for checking.  Yes, I got a note that he had the TIPS placed successfully.  He should definitely get a COVID vaccine. No need to delay that because of the recent procedure.

## 2020-05-24 NOTE — Telephone Encounter (Signed)
Berneta Sages congregational nurse from [facility] is wanting to know if this pt should receive his covid vaccine even though he recently had a surgery   [phone]

## 2020-05-24 NOTE — Telephone Encounter (Signed)
Please see note below and advise, pt had TIPS procedure.

## 2020-05-24 NOTE — Telephone Encounter (Signed)
CN aware that pt may have vaccine.

## 2020-05-28 ENCOUNTER — Telehealth: Payer: Self-pay | Admitting: *Deleted

## 2020-05-28 NOTE — Telephone Encounter (Signed)
I called Donald Aguilar to schedule follow up with Dr. Vernard Gambles from TIPS procedure the patient states "no I don't want to follow up", I told him it was very important to follow up and he states again "No I don't want to follow up, I make my own decisions."  I advised him to call our office if needed.Cathren Harsh

## 2020-05-29 ENCOUNTER — Other Ambulatory Visit: Payer: Self-pay

## 2020-05-29 ENCOUNTER — Encounter (INDEPENDENT_AMBULATORY_CARE_PROVIDER_SITE_OTHER): Payer: Self-pay | Admitting: Primary Care

## 2020-05-29 ENCOUNTER — Ambulatory Visit (INDEPENDENT_AMBULATORY_CARE_PROVIDER_SITE_OTHER): Payer: Medicare Other | Admitting: Primary Care

## 2020-05-29 VITALS — BP 114/74 | HR 89 | Temp 98.6°F | Ht 65.0 in | Wt 147.0 lb

## 2020-05-29 DIAGNOSIS — B182 Chronic viral hepatitis C: Secondary | ICD-10-CM

## 2020-05-29 DIAGNOSIS — Z09 Encounter for follow-up examination after completed treatment for conditions other than malignant neoplasm: Secondary | ICD-10-CM

## 2020-05-29 NOTE — Progress Notes (Signed)
Assessment and Plan: Chronic hepatitis C without hepatic coma (HCC) Chronic ascites treatment   TIPS, Paracentesis  Hospital discharge follow-up   HPI [name] 69 y.o.male [name] only speaks  [place] interpreter [employer]  Present. He  presents for follow up from the hospital. Admit date to the hospital was 05/11/20, patient was discharged from the hospital on 05/12/20, patient was admitted for: Cirrhosis with recurrent ascites from hepatitis . He received TIPS, Paracentesis .   Patient voiced tenderness on the right side of the abdomen with itching.  Explained the function of the liver and can cause purities. Scrotum enlarge and painful   Past Medical History:  Diagnosis Date   Cirrhosis ([place]) 08/2019   with ascites, varices   GERD (gastroesophageal reflux disease)    Hepatitis C    Genotype 1a   Hypotension      No Known Allergies    Current Outpatient Medications on File Prior to Visit  Medication Sig Dispense Refill   furosemide (LASIX) 40 MG tablet Take 1 tablet (40 mg total) by mouth daily. (Patient not taking: Reported on 05/04/2020) 30 tablet 3   lactulose (CHRONULAC) 10 GM/15ML solution Take 15 mLs (10 g total) by mouth 2 (two) times daily. (Patient not taking: Reported on 05/29/2020) 900 mL 2   midodrine (PROAMATINE) 5 MG tablet Take 1 tablet (5 mg total) by mouth 3 (three) times daily with meals. (Patient not taking: Reported on 04/11/2020) 90 tablet 1   pantoprazole (PROTONIX) 40 MG tablet Take 1 tablet by mouth twice daily (Patient not taking: Reported on 04/11/2020) 60 tablet 3   potassium chloride SA (KLOR-CON) 20 MEQ tablet Take 2 tablets by mouth once daily (Patient not taking: Reported on 04/11/2020) 60 tablet 1   spironolactone (ALDACTONE) 50 MG tablet Take 1 tablet (50 mg total) by mouth daily. (Patient not taking: Reported on 05/04/2020) 30 tablet 3   No current facility-administered medications on file prior to visit.    ROS: all negative except  above.   Physical Exam: Filed Weights   05/29/20 1358  Weight: 147 lb (66.7 kg)   BP 114/74 (BP Location: Right Arm, Patient Position: Sitting, Cuff Size: Normal)    Pulse 89    Temp 98.6 F (37 C) (Oral)    Ht 5\' 5"  (1.651 m)    Wt 147 lb (66.7 kg)    SpO2 97%    BMI 24.46 kg/m  General Appearance: Well nourished, in no apparent distress. Eyes: PERRLA, EOMs, conjunctiva no swelling or erythema Sinuses: No Frontal/maxillary tenderness TMs without erythema, bulging.  Hearing normal.  Neck: Supple, thyroid normal.  Respiratory: Respiratory effort normal, BS equal bilaterally without rales, rhonchi, wheezing or stridor.  Cardio: RRR with no MRGs. Brisk peripheral pulses without edema.  Abdomen: Soft, + BS.  Tender right side , no guarding, rebound, hernias, masses. Lymphatics: Non tender without lymphadenopathy.  Musculoskeletal: Full ROM, 5/5 strength, normal gait.  Skin: Scotrum swollen size of tennis ball pain. Warm, dry without rashes, lesions, ecchymosis.  Neuro: Cranial nerves intact. Normal muscle tone, no cerebellar symptoms. Sensation intact.  Psych: Awake and oriented X 3, normal affect, Insight and Judgment appropriate.  Donald Aguilar was seen today for hospitalization follow-up.  Kerin Perna, NP 2:05 PM

## 2020-06-06 ENCOUNTER — Other Ambulatory Visit: Payer: Self-pay | Admitting: Interventional Radiology

## 2020-06-06 ENCOUNTER — Telehealth: Payer: Self-pay

## 2020-06-06 DIAGNOSIS — R188 Other ascites: Secondary | ICD-10-CM

## 2020-06-06 NOTE — Telephone Encounter (Signed)
Phone call from [place] at [facility].  Patient is scheduled for follow-up visit with Dr. Vernard Gambles on September 7,2021 @ 8:45 am.  Patient needs to be NPO after midnight prior to appointment.  Transportation will be requested thru Medicaid.  CN and interpreter [name] called patient and gave him this information. Stated he understood and agreed to keep appointment.  [name] RN, Congregational Nurse [phone]

## 2020-06-26 ENCOUNTER — Other Ambulatory Visit (HOSPITAL_COMMUNITY): Payer: Self-pay | Admitting: Interventional Radiology

## 2020-06-26 ENCOUNTER — Ambulatory Visit
Admission: RE | Admit: 2020-06-26 | Discharge: 2020-06-26 | Disposition: A | Discharge status: Home / Self Care | Payer: Medicare Other | Source: Ambulatory Visit | Attending: Interventional Radiology | Admitting: Interventional Radiology

## 2020-06-26 ENCOUNTER — Encounter: Payer: Self-pay | Admitting: *Deleted

## 2020-06-26 DIAGNOSIS — R188 Other ascites: Secondary | ICD-10-CM

## 2020-06-26 HISTORY — PX: IR RADIOLOGIST EVAL & MGMT: IMG5224

## 2020-06-26 IMAGING — US US ART/VEN ABD/PELV/SCROTUM DOPPLER COMPLETE
1 series · 13 of 25 positions shown · non-contrast
Comparison: TIPS procedure [DATE]

CLINICAL DATA: 69-year-old with cirrhosis and history of refractory
ascites. Patient underwent a transjugular intrahepatic portosystemic
shunt placement on [DATE]. Patient presents for follow-up.

EXAM:
DUPLEX ULTRASOUND OF LIVER AND TIPS SHUNT
TECHNIQUE: Color and duplex Doppler ultrasound was performed to evaluate the
hepatic in-flow and out-flow vessels.

[Series 1: us art/ven abd/pelv/scrotum doppler complete · 0.23mm/px · 13 of 51 slices shown]
[im 1/51]
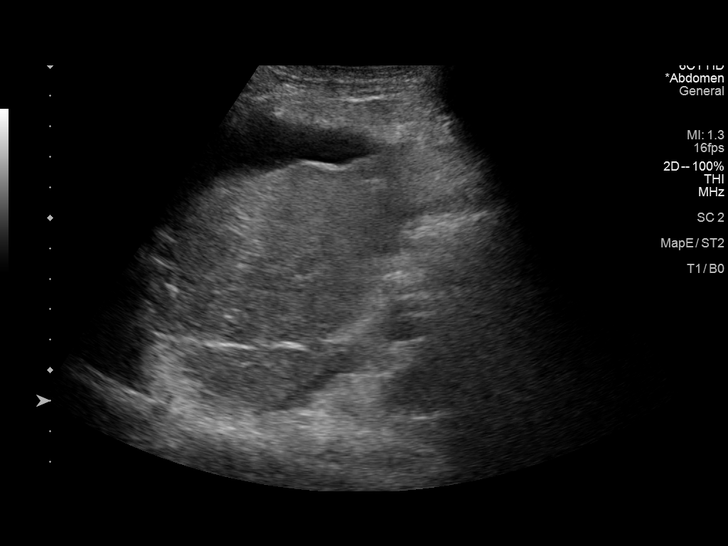
[im 5/51]
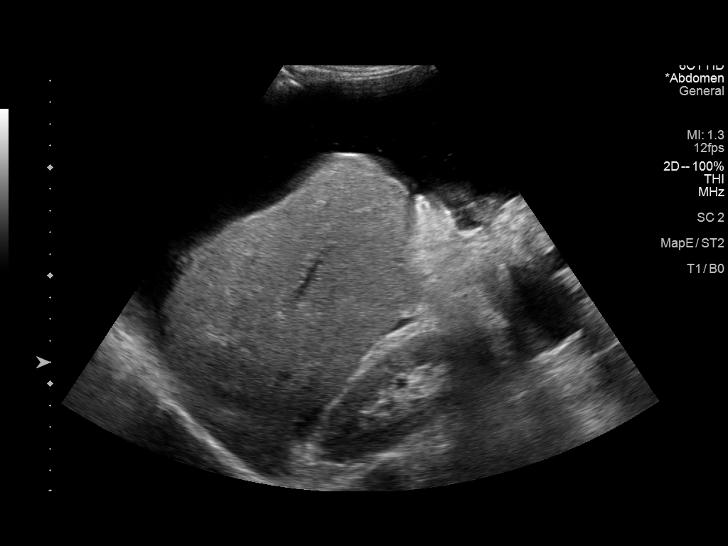
[im 9/51]
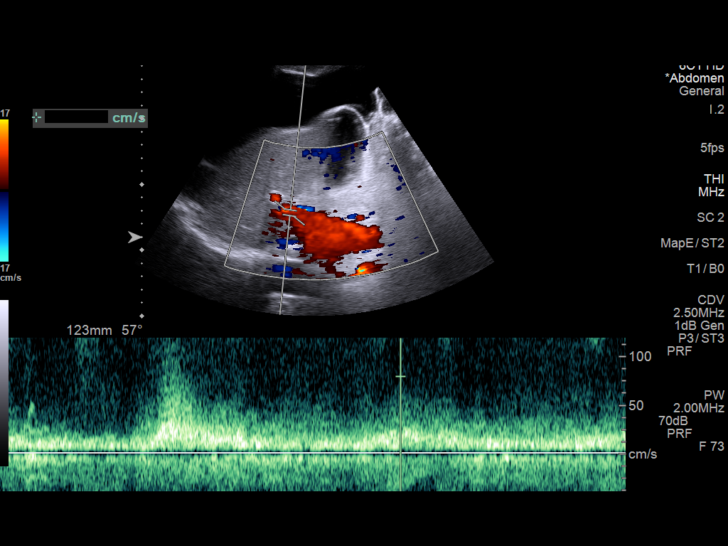
[im 13/51]
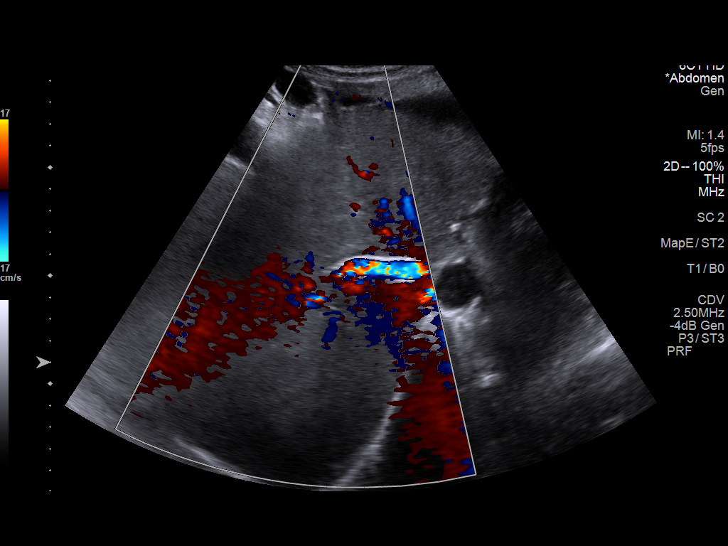
[im 17/51]
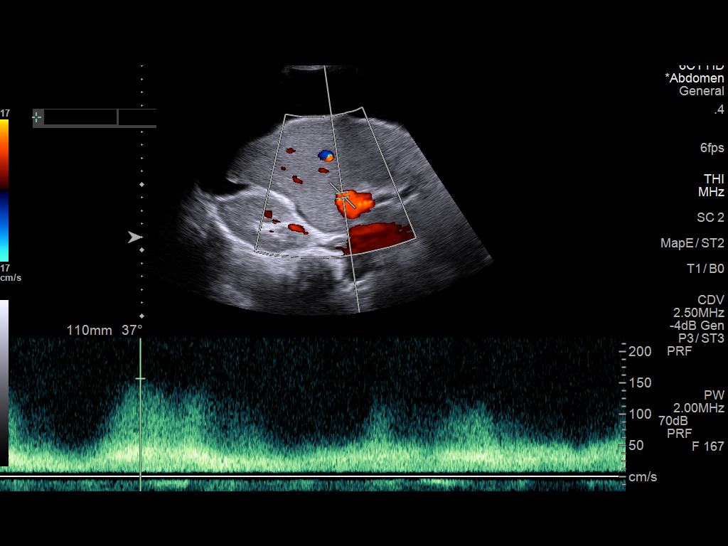
[im 21/51]
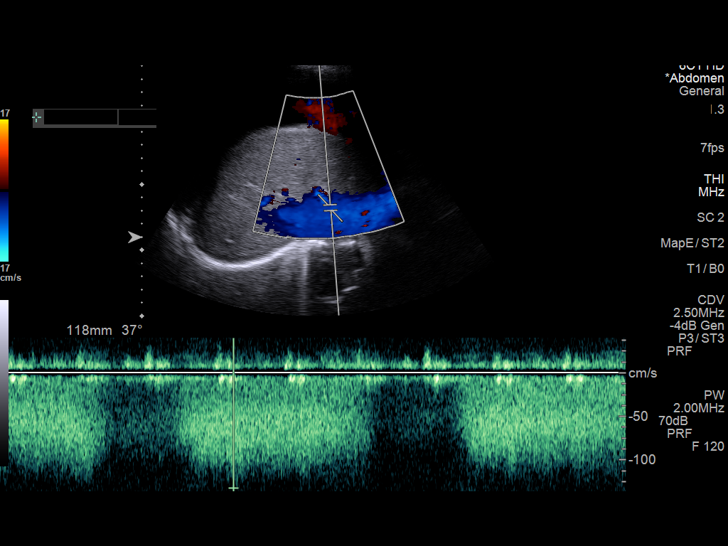
[im 26/51]
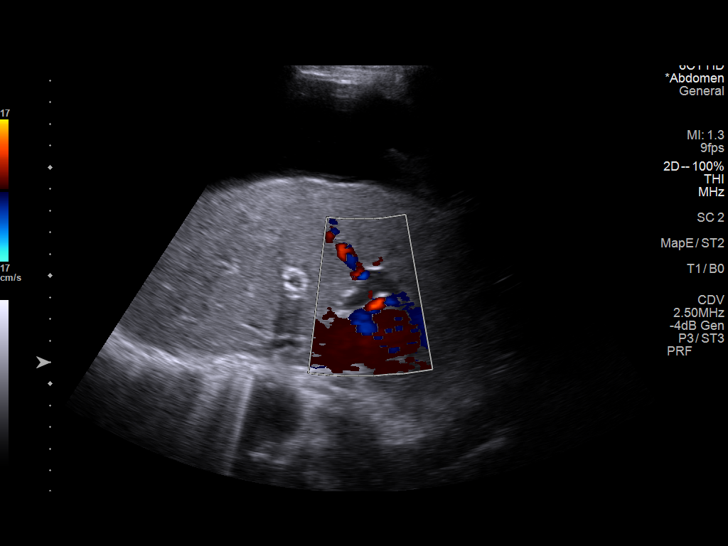
[im 30/51]
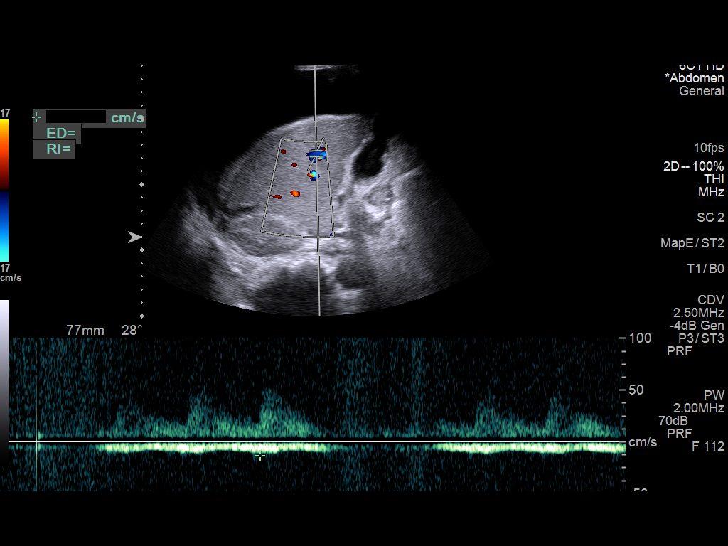
[im 34/51]
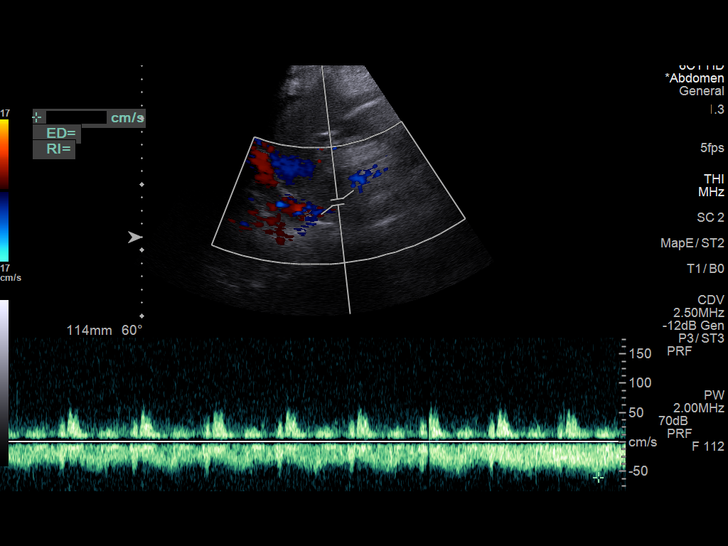
[im 38/51]
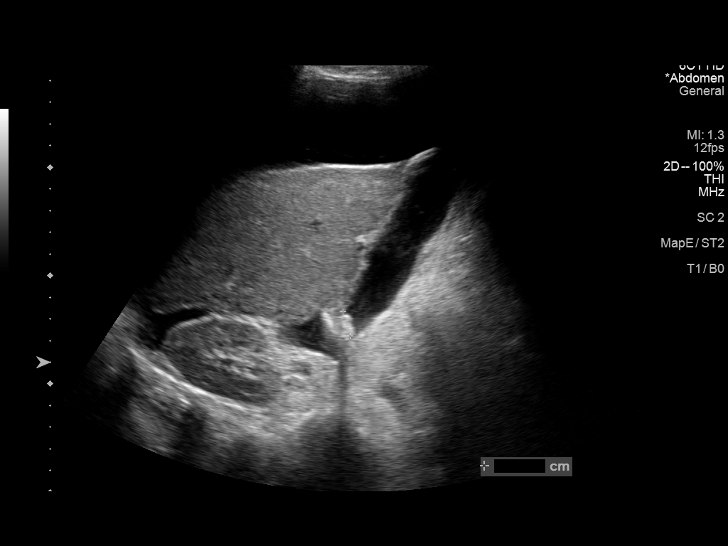
[im 42/51]
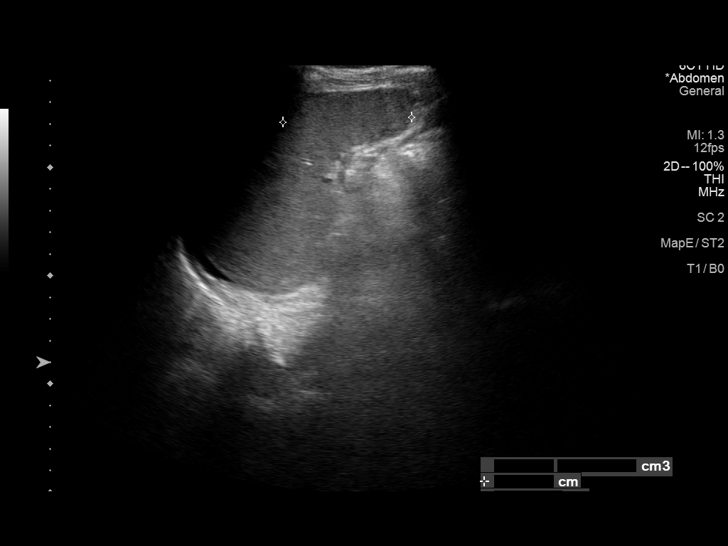
[im 46/51]
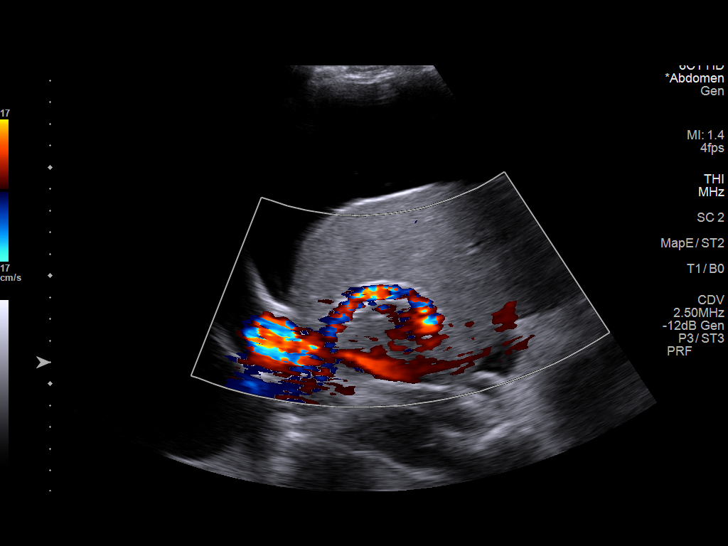
[im 51/51]
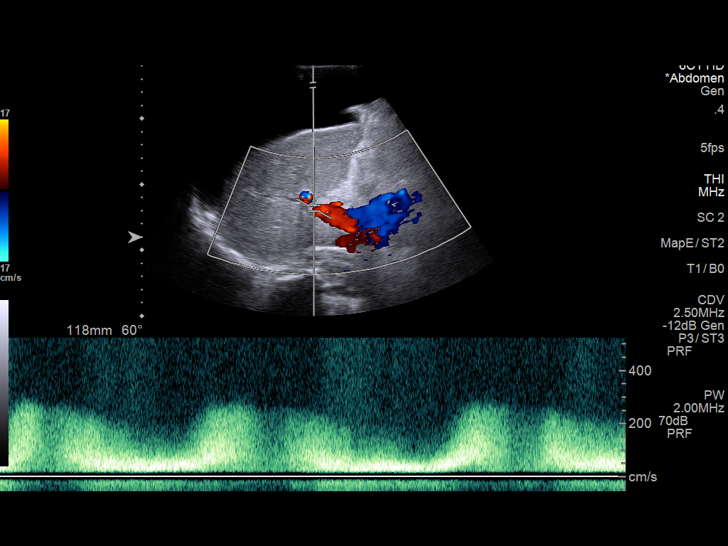

[13 of 25 positions shown; findings below may reference images not displayed]

FINDINGS: Portal Vein Velocities

Main:  38 cm/sec

Right:  80 cm/sec

Left: Not detected, possibly hepatofugal flow.

TIPS Stent Velocities

Proximal-portal: Range 88-313 cm/sec

Mid:  256 cm/sec

Distal-hepatic: Range 157-216 cm/sec

IVC: Patent with normal phasicity.

Hepatic Vein Velocities

Right:  57 cm/sec

Mid:  26 cm/sec

Left:  32 cm/sec

Splenic Vein: 42 cm/sec

Superior Mesenteric Vein: Not visualized

Hepatic Artery: 84 cm/sec

Ascities: Present

Varices: Limited evaluation

Other findings: Moderate to large amount of ascites in the right
upper quadrant, right lower quadrant and left lower quadrant. Small
amount of ascites in left upper quadrant. Liver has a nodular
contour and compatible with cirrhosis. Echogenic structures in the
gallbladder are suggestive for small gallstones, largest measuring
1.1 cm. No significant gallbladder distension. Spleen measures up to
10.4 cm with a calculated volume of 222 mL. Normal hepatopetal flow
in the main portal vein.
IMPRESSION: 1. TIPS stent is patent. Some of the velocities measured within the
stent are markedly elevated and may not be accurate. This
examination can serve as a baseline and recommend correlating with
follow-up examinations.
2. Main portal vein is patent. Limited evaluation of the left portal
vein and suspect there may be hepatofugal flow in the left portal
venous system.
3. Moderate to large amount of ascites.

## 2020-06-26 MED ORDER — LACTULOSE 10 GM/15ML PO SOLN: 20.0000 g | Freq: Three times a day (TID) | ORAL | Status: DC

## 2020-06-26 MED ORDER — LACTULOSE ENCEPHALOPATHY 10 GM/15ML PO SOLN: 20.0000 g | Freq: Two times a day (BID) | ORAL | 0 refills | Status: DC

## 2020-06-26 NOTE — Progress Notes (Signed)
Patient ID: Donald Aguilar, male   DOB: 1951-07-23, 69 y.o.   MRN: [id number]       Chief Complaint: Patient was seen in consultation today for follow-up TIPS at the request of Shianna Bally  Referring Physician(s): Wilfrid Lund MD  History of Present Illness: Donald Aguilar is a 69 y.o. male  [name] .  History obtained from the patient with the aid of interpreter, and epic chart.  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 Large volume ascites and severe scrotal edema, requiring frequent paracentesis.  14 paracentesis procedures since 09/29/2019.  Relative hypotension limiting ability to increase diuretics. Challenging social situation with limited resources and health literacy.  The patient continues to struggle with generalized abdominal discomfort from large volume ascites, makes it difficult to sleep.  He also continues to have severe scrotal edema. He has not been assessed for liver transplant.  He underwent uncomplicated TIPS creation 05/11/2020, along with large-volume paracentesis. He is doing well since discharge.  He has not required paracentesis since TIPS creation.  He denies any symptoms of hepatic encephalopathy.  Persistent scrotal edema.   Past Medical History:  Diagnosis Date  . 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 GERD (gastroesophageal reflux disease)   . Hepatitis C    Genotype 1a  . Hypotension     Past Surgical History:  Procedure Laterality Date  . BIOPSY  09/13/2019   Procedure: BIOPSY;  Surgeon: Ladene Artist, MD;  Location: [facility] ENDOSCOPY;  Service: Endoscopy;;  . ESOPHAGOGASTRODUODENOSCOPY (EGD) WITH PROPOFOL N/A 09/13/2019   Procedure: ESOPHAGOGASTRODUODENOSCOPY (EGD) WITH PROPOFOL;  Surgeon: Ladene Artist, MD;  Location: [facility] ENDOSCOPY;  Service: Endoscopy;  Laterality: N/A;  . IR PARACENTESIS  09/12/2019  . IR PARACENTESIS  09/29/2019  . IR PARACENTESIS   10/19/2019  . IR PARACENTESIS  11/02/2019  . IR PARACENTESIS  11/10/2019  . IR PARACENTESIS  11/22/2019  . IR PARACENTESIS  12/06/2019  . IR PARACENTESIS  12/16/2019  . IR PARACENTESIS  12/23/2019  . IR PARACENTESIS  12/30/2019  . IR PARACENTESIS  01/06/2020  . IR PARACENTESIS  01/16/2020  . IR PARACENTESIS  02/01/2020  . IR PARACENTESIS  02/08/2020  . IR PARACENTESIS  02/28/2020  . IR PARACENTESIS  03/23/2020  . IR PARACENTESIS  04/17/2020  . IR PARACENTESIS  05/02/2020  . IR PARACENTESIS  05/11/2020  . IR RADIOLOGIST EVAL & MGMT  03/20/2020  . IR RADIOLOGIST EVAL & MGMT  06/26/2020  . IR TIPS  05/11/2020  . RADIOLOGY WITH ANESTHESIA N/A 05/11/2020   Procedure: IR WITH ANESTHESIA TRASJUGULAR INTRAHEPATIC PORTOSYSTEMIC SHUNT;  Surgeon: Arne Cleveland, MD;  Location: [place];  Service: Radiology;  Laterality: N/A;    Allergies: Patient has no known allergies.  Medications: Prior to Admission medications   Medication Sig Start Date End Date Taking? Authorizing Provider  furosemide (LASIX) 40 MG tablet Take 1 tablet (40 mg total) by mouth daily. Patient not taking: Reported on 05/04/2020 02/24/20   Doran Stabler, MD  lactulose (CHRONULAC) 10 GM/15ML solution Take 15 mLs (10 g total) by mouth 2 (two) times daily. Patient not taking: Reported on 05/29/2020 05/12/20 08/10/20  Candiss Norse A, [name]  lactulose, encephalopathy, (CHRONULAC) 10 GM/15ML SOLN Take 30 mLs (20 g total) by mouth 2 (two) times daily. 06/26/20   Arne Cleveland, MD  midodrine (PROAMATINE) 5 MG tablet Take 1 tablet (5 mg total) by mouth 3 (three) times daily with meals.  Patient not taking: Reported on 04/11/2020 12/14/19   Doran Stabler, MD  pantoprazole (PROTONIX) 40 MG tablet Take 1 tablet by mouth twice daily Patient not taking: Reported on 04/11/2020 02/17/20   Esterwood, Amy S, [name]  potassium chloride SA (KLOR-CON) 20 MEQ tablet Take 2 tablets by mouth once daily Patient not taking: Reported on 04/11/2020 02/14/20   Doran Stabler, MD  spironolactone (ALDACTONE) 50 MG tablet Take 1 tablet (50 mg total) by mouth daily. Patient not taking: Reported on 05/04/2020 02/24/20   Doran Stabler, MD     Family History  Problem Relation Age of Onset  . Liver cancer Neg Hx     Social History   Socioeconomic History  . Marital status: Married    Spouse name: Not on file  . Number of children: Not on file  . Years of education: Not on file  . Highest education level: Not on file  Occupational History  . Not on file  Tobacco Use  . Smoking status: Current Every Day Smoker    Packs/day: 0.30    Years: 50.00    Pack years: 15.00    Types: Cigarettes  . Smokeless tobacco: Never Used  . Tobacco comment: approximately a couple of packs a week  Substance and Sexual Activity  . Alcohol use: Not Currently    Comment: last drank in September, 2020  . Drug use: Never  . Sexual activity: Not on file  Other Topics Concern  . Not on file  Social History Narrative   Leave with 3 friends   Smoke 2 cigarette  a day   Social Determinants of Health   Financial Resource Strain:   . Difficulty of Paying Living Expenses: Not on file  Food Insecurity:   . Worried About Charity fundraiser in the Last Year: Not on file  . Ran Out of Food in the Last Year: Not on file  Transportation Needs:   . Lack of Transportation (Medical): Not on file  . Lack of Transportation (Non-Medical): Not on file  Physical Activity:   . Days of Exercise per Week: Not on file  . Minutes of Exercise per Session: Not on file  Stress:   . Feeling of Stress : Not on file  Social Connections:   . Frequency of Communication with Friends and Family: Not on file  . Frequency of Social Gatherings with Friends and Family: Not on file  . Attends Religious Services: Not on file  . Active Member of Clubs or Organizations: Not on file  . Attends Archivist Meetings: Not on file  . Marital Status: Not on file    ECOG Status: 1 -  Symptomatic but completely ambulatory  Review of Systems: A 12 point ROS discussed and pertinent positives are indicated in the HPI above.  All other systems are negative.  Review of Systems  Vital Signs: There were no vitals taken for this visit.  Physical Exam  Mallampati Score:     Imaging: EXAM: 1. TIPS CREATION 2. ULTRASOUND-GUIDED VENOUS ACCESS  ANESTHESIA/SEDATION: General - as administered by the Anesthesia department  MEDICATIONS: Cefazolin 2 g IV within 1 hour of procedure start  FLUOROSCOPY TIME: 25 minutes 30 seconds; 245 mGy  COMPLICATIONS: None immediate.  PROCEDURE: Informed written consent was obtained from the patient with the aid of interpreter after a thorough discussion of the procedural risks, benefits and alternatives. All questions were addressed. See previous consultation. Maximal Sterile Barrier Technique was  utilized including caps, mask, sterile gowns, sterile gloves, sterile drape, hand hygiene and skin antiseptic. A timeout was performed prior to the initiation of the procedure.  The skin overlying the right upper abdominal quadrant as well as the right neck region were prepped and draped in usual sterile fashion. Paracentesis was initiated preprocedure, dictated separately.  Under direct ultrasound guidance, a peripheral aspect of the right portal vein was accessed with a 21 gauge micropuncture needle. Ultrasound image was saved for procedural documentation purposes. Guidewire advanced into the main portal vein. Micropuncture transitional dilator was advanced over the guidewire, with the radiopaque transition positioned at the target entrance to the peripheral right portal vein.  Next, the right internal jugular vein was accessed under direct ultrasound. Ultrasound image was saved for procedural documentation purposes. This allowed for placement of the 10 French TIPS vascular sheath.  With the aid of angiographic guidewires,  an MPA catheter was utilized to select the right hepatic vein and a hepatic venogram was performed. Sheath advanced into the right hepatic vein.  Under fluoroscopic guidance, the right portal vein with targeted with a Rsch-Uchida TIPS needle directed at the radiopaque target within the right portal vein.. Several passes before and after modification needle angle would not reach the portal vein. Therefore, the middle hepatic vein was catheterized. Catheter position confirmed on ultrasound. From this approach, the right portal vein was accessed at a desirable location allowing advancement of a stiff Glidewire into the main portal vein. A 4 French glide catheter was advanced over the stiff Glidewire and a portal venogram was Performed. Next, the track was dilated with a 4 mm Mustang Balloon, allowing advancement of the measuring pigtail into the main portal vein. A portal venogram was performed with a measuring Omni Flush catheter.  The sheath was advanced into the main portal vein. The percutaneous portal microcatheter and wire were removed.  Next, a 2 cm (un covered) x 6 cm (covered) x 10 mm diameter GORE VIATORR TIPS Endoprosthesis was advanced through the sheath across the intra hepatic track and deployed. The TIPS stent was angioplastied in multiple stations to 8 mm diameter.  Follow-up portal venogram demonstrated good stent deployment and flow, no residual stenosis. Enlarged coronary vein collateral channels were identified. Pressure measurements were obtained demonstrating no portosystemic gradient. Wires, catheters and sheaths were removed from the patient. Hemostasis was achieved at the right IJ access site with manual compression. A dressing was placed.  The patient tolerated the procedure well. Patient transferred to the PACU.  IMPRESSION: 1. Technically successful TIPS creation from right portal vein to middle hepatic vein, with elimination of portosystemic  gradient.    Labs:  CBC: Recent Labs    01/30/20 0919 04/24/20 0855 05/01/20 0925 05/11/20 0634  WBC 7.2 6.7 7.5 5.9  HGB 12.1* 12.3* 12.9* 12.4*  HCT 35.2* 38.3* 39.2 39.3  PLT 266.0 207 231 193    COAGS: Recent Labs    11/02/19 0903 01/30/20 0919 04/24/20 0855 05/11/20 0634  INR 1.6* 1.2* 1.2 1.2    BMP: Recent Labs    11/02/19 0903 11/08/19 1043 02/24/20 0934 04/24/20 0855 05/01/20 0925 05/11/20 0634  NA 133*   < > 134* 135 138 137  K 3.0*   < > 3.7 3.8 3.8 3.5  CL 95*   < > 104 103 105 105  CO2 28   < > 27 25 24 24   GLUCOSE 107*   < > 87 90 105* 99  BUN 6*   < > 8  10 6* 11  CALCIUM 8.0*   < > 8.5 8.5* 8.5* 8.4*  CREATININE 0.69   < > 0.75 0.76 0.86 0.81  GFRNONAA >60  --   --  >60 >60 >60  GFRAA >60  --   --  >60 >60 >60   < > = values in this interval not displayed.    LIVER FUNCTION TESTS: Recent Labs    01/30/20 0919 04/24/20 0855 05/01/20 0925 05/11/20 0634  BILITOT 1.5* 1.5* 2.2* 1.7*  AST 48* 53* 50* 57*  ALT 24 25 25 27   ALKPHOS 109 76 80 66  PROT 7.8 7.5 7.5 7.3  ALBUMIN 2.6* 2.5* 2.5* 2.4*    TUMOR MARKERS: No results for input(s): AFPTM, CEA, CA199, CHROMGRNA in the last 8760 hours.  Assessment and Plan:  My impression is that the patient has done well post TIPS creation.  He has not required paracentesis since TIPS.  He denies any symptoms of hepatic encephalopathy.  Currently he is not taking the ordered lactulose.  He describes some problem getting the prescription filled.    I described the importance of continued lactulose use to avoid hepatic encephalopathy, and I called in the prescription to his [place] 20 mg p.o. twice daily for now. I emphasized the importance of continuing scheduled follow-up with his gastroenterologist Dr. Loletha Carrow.  We went ahead and confirmed his next appointment time before he left today, and gave him the address on a print out. I emphasized the importance of routine annual ultrasound tips  surveillance to ensure there is no early restenosis, which is relatively rare with modern covered stents used for TIPS.  We can do this sooner if he starts to develop significant symptomatic abdominal ascites again. With the aid of interpreter, we again reviewed that TIPS provides only symptomatic relief and is not a cure for cirrhosis.  The only cure for cirrhosis is liver transplant and I would recommend evaluation if this has not already occurred. Thank you for this interesting consult.  I greatly enjoyed meeting Donald Aguilar and look forward to participating in their care.  A copy of this report was sent to the requesting provider on this date.  Electronically Signed: Rickard Rhymes 06/26/2020, 12:47 PM   I spent a total of   40 Minutes in face to face in clinical consultation, greater than 50% of which was counseling/coordinating care for follow-up post TIPS creation.

## 2020-07-18 ENCOUNTER — Telehealth: Payer: Self-pay

## 2020-07-18 NOTE — Telephone Encounter (Signed)
Phone call to patient with interpreter [name] assisting.  Told patient we were calling to see how he is doing.  He stated he is not well and has problem with abdominal fluid and pain and needs an appointment to have fluid removed.  Advised patient Dr. Loletha Carrow will be contacted and we will call him regarding future appointments.  He also requested help with Medicaid  transportation to appointment. [name] RN, Congregational Nurse [phone]

## 2020-07-20 ENCOUNTER — Telehealth: Payer: Self-pay

## 2020-07-20 ENCOUNTER — Encounter: Payer: Self-pay | Admitting: Gastroenterology

## 2020-07-20 ENCOUNTER — Other Ambulatory Visit (INDEPENDENT_AMBULATORY_CARE_PROVIDER_SITE_OTHER): Payer: Medicare Other

## 2020-07-20 ENCOUNTER — Ambulatory Visit (INDEPENDENT_AMBULATORY_CARE_PROVIDER_SITE_OTHER): Payer: Medicare Other | Admitting: Gastroenterology

## 2020-07-20 VITALS — BP 114/56 | HR 62 | Ht 64.75 in | Wt 136.0 lb

## 2020-07-20 DIAGNOSIS — N5089 Other specified disorders of the male genital organs: Secondary | ICD-10-CM | POA: Diagnosis not present

## 2020-07-20 DIAGNOSIS — K7031 Alcoholic cirrhosis of liver with ascites: Secondary | ICD-10-CM

## 2020-07-20 DIAGNOSIS — R188 Other ascites: Secondary | ICD-10-CM

## 2020-07-20 DIAGNOSIS — B182 Chronic viral hepatitis C: Secondary | ICD-10-CM | POA: Diagnosis not present

## 2020-07-20 DIAGNOSIS — I8510 Secondary esophageal varices without bleeding: Secondary | ICD-10-CM

## 2020-07-20 LAB — COMPREHENSIVE METABOLIC PANEL
ALT: 27 U/L (ref 0–53)
AST: 65 U/L — ABNORMAL HIGH (ref 0–37)
Albumin: 2.7 g/dL — ABNORMAL LOW (ref 3.5–5.2)
Alkaline Phosphatase: 463 U/L — ABNORMAL HIGH (ref 39–117)
BUN: 7 mg/dL (ref 6–23)
CO2: 29 mEq/L (ref 19–32)
Calcium: 8.2 mg/dL — ABNORMAL LOW (ref 8.4–10.5)
Chloride: 104 mEq/L (ref 96–112)
Creatinine, Ser: 0.76 mg/dL (ref 0.40–1.50)
GFR: 102.11 mL/min (ref >60.00)
Glucose, Bld: 56 mg/dL — ABNORMAL LOW (ref 70–99)
Potassium: 2.6 mEq/L — CL (ref 3.5–5.1)
Sodium: 139 mEq/L (ref 135–145)
Total Bilirubin: 2.3 mg/dL — ABNORMAL HIGH (ref 0.2–1.2)
Total Protein: 7.7 g/dL (ref 6.0–8.3)

## 2020-07-20 MED ORDER — SPIRONOLACTONE 100 MG PO TABS: 100.0000 mg | ORAL_TABLET | Freq: Every day | ORAL | 3 refills | Status: DC

## 2020-07-20 MED ORDER — LACTULOSE 10 GM/15ML PO SOLN: 20.0000 g | Freq: Every day | ORAL | 3 refills | Status: DC

## 2020-07-20 MED ORDER — FUROSEMIDE 40 MG PO TABS
40.0000 mg | ORAL_TABLET | Freq: Every day | ORAL | 3 refills | Status: DC
Start: 2020-07-20 — End: 2020-08-09

## 2020-07-20 NOTE — Telephone Encounter (Signed)
Phone call to interpreter [name] who called patient and relayed information regarding critical potassium level and need to go to ED.  He agreed to go.  CN arranged for [facility] to take him to ED and called patient to tell him his ride is coming in 10 minutes.  [name] RN, Congregational Nurse [phone]

## 2020-07-20 NOTE — Telephone Encounter (Signed)
Phone call from [place] at [place].  She stated that his lab work from earlier today showed that his potassium is critically low at 2.6 and that Dr. Loletha Carrow wants patient to go to the ED.  CN will call interpreter to communicate this to patient.  [name] RN, Congregational nurse [phone]

## 2020-07-20 NOTE — Telephone Encounter (Signed)
Phone call to patient who stated he did not go to ED.  CN then called interpreter/pastor Y'Hin Steva Colder and asked him to talk to patient regarding urgency of going to ED.  Patient told Grant Ruts he had been there before and had to wait too long and felt he was treated unfairly and refused to go. He also stated he would rather die than go back to ED.  [name] RN, Congregational Nurse [phone]

## 2020-07-20 NOTE — Progress Notes (Addendum)
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  He has since undergone TIPS placement on July 23 with Dr. Vernard Gambles, which reportedly went well.  I reviewed the follow-up IR clinic note from 06/26/2020, where patient has not required paracentesis since TIPS placement but has persistent scrotal edema.  No apparent hepatic encephalopathy, but was not taking lactulose as prescribed. ____________________________  Donald Aguilar was seen with a [name] interpreter and a Scientist, research (physical sciences), who helps coordinate his care. He has recurrent ascites with pressure and discomfort as well as severe ongoing scrotal edema. Unfortunately, he is not taking any of the prescribed medications, but he cannot say why.  (Ongoing chronic struggle managing this case due to poor health literacy, social stresses and language barrier)  He again seemed unaware of the need for sodium restriction or what that meant.  ROS: Cardiovascular:  no chest pain Respiratory: no dyspnea Uncomfortable when he walks due to the scrotal edema No apparent fevers. Remainder of systems negative except as above  The patient's Past Medical, Family and Social History were reviewed and are on file in the EMR.  Objective:  Med list reviewed No current outpatient medications on file. Currently taking no meds  Vital signs in last 24 hrs: Vitals:   07/20/20 0829  BP: (!) 114/56  Pulse: 62    Physical Exam  Chronically ill-appearing as before, poor muscle mass.  Antalgic gait, gets on exam table slowly but without assistance  HEENT: sclera anicteric, oral mucosa moist without lesions  Neck: supple, no thyromegaly, JVD or lymphadenopathy  Cardiac: RRR without murmurs, S1S2 heard, no peripheral edema  Pulm: clear to auscultation bilaterally, normal RR and effort  noted  Abdomen: soft, protuberant and bulging flanks from ascites, no tenderness, not tense, with active bowel sounds.  No caput medusae  Skin; warm and dry, no jaundice or rash Neuro: Interpreter says he has normal speech.  He is alert and not overtly encephalopathic.  No asterixis.  Gait slow but steady  Marked scrotal edema  Labs:  CBC Latest Ref Rng & Units 05/11/2020 05/01/2020 04/24/2020  WBC 4.0 - 10.5 K/uL 5.9 7.5 6.7  Hemoglobin 13.0 - 17.0 g/dL 12.4(L) 12.9(L) 12.3(L)  Hematocrit 39 - 52 % 39.3 39.2 38.3(L)  Platelets 150 - 400 K/uL 193 231 207   CMP Latest Ref Rng & Units 05/11/2020 05/01/2020 04/24/2020  Glucose 70 - 99 mg/dL 99 105(H) 90  BUN 8 - 23 mg/dL 11 6(L) 10  Creatinine 0.61 - 1.24 mg/dL 0.81 0.86 0.76  Sodium 135 - 145 mmol/L 137 138 135  Potassium 3.5 - 5.1 mmol/L 3.5 3.8 3.8  Chloride 98 - 111 mmol/L 105 105 103  CO2 22 - 32 mmol/L 24 24 25   Calcium 8.9 - 10.3 mg/dL 8.4(L) 8.5(L) 8.5(L)  Total Protein 6.5 - 8.1 g/dL 7.3 7.5 7.5  Total Bilirubin 0.3 - 1.2 mg/dL 1.7(H) 2.2(H) 1.5(H)  Alkaline Phos 38 - 126 U/L 66 80 76  AST 15 - 41 U/L 57(H) 50(H) 53(H)  ALT 0 - 44 U/L 27 25 25    Lab Results  Component Value Date   INR 1.2 05/11/2020   INR 1.2 04/24/2020   INR 1.2 (H) 01/30/2020   Last AFP 3.3 in November 2020  Genotype 1a hepatitis C with viral load 208,000 in December 2020 ___________________________________________ Radiologic studies:  Report of TIPS  placement from 05/11/2020 was reviewed. I also discussed the procedure and post procedure management with Dr. Vernard Gambles afterward.   Last CT angio of abdomen 03/02/2020 ___________________________________  Ultrasound with Doppler done on 06/26/2020 at time of most recent IR clinic visit:  CLINICAL DATA:  69 year old with cirrhosis and history of refractory ascites. Patient underwent a transjugular intrahepatic portosystemic shunt placement on 05/11/2020. Patient presents for follow-up.   EXAM: DUPLEX  ULTRASOUND OF LIVER AND TIPS SHUNT   TECHNIQUE: Color and duplex Doppler ultrasound was performed to evaluate the hepatic in-flow and out-flow vessels.   COMPARISON:  TIPS procedure 05/11/2020   FINDINGS: Portal Vein Velocities   Main:  38 cm/sec   Right:  80 cm/sec   Left: Not detected, possibly hepatofugal flow.   TIPS Stent Velocities   Proximal-portal: Range 88-313 cm/sec   Mid:  256 cm/sec   Distal-hepatic: Range 157-216 cm/sec   IVC: Patent with normal phasicity.   Hepatic Vein Velocities   Right:  57 cm/sec   Mid:  26 cm/sec   Left:  32 cm/sec   Splenic Vein: 42 cm/sec   Superior Mesenteric Vein: Not visualized   Hepatic Artery: 84 cm/sec   Ascities: Present   Varices: Limited evaluation   Other findings: Moderate to large amount of ascites in the right upper quadrant, right lower quadrant and left lower quadrant. Small amount of ascites in left upper quadrant. Liver has a nodular contour and compatible with cirrhosis. Echogenic structures in the gallbladder are suggestive for small gallstones, largest measuring 1.1 cm. No significant gallbladder distension. Spleen measures up to 10.4 cm with a calculated volume of 222 mL. Normal hepatopetal flow in the main portal vein.   IMPRESSION: 1. TIPS stent is patent. Some of the velocities measured within the stent are markedly elevated and may not be accurate. This examination can serve as a baseline and recommend correlating with follow-up examinations. 2. Main portal vein is patent. Limited evaluation of the left portal vein and suspect there may be hepatofugal flow in the left portal venous system. 3. Moderate to large amount of ascites.     Electronically Signed   By: [name] M.D.   On: 06/26/2020 14:16   ____________________________________________ Other:   _____________________________________________ Assessment & Plan  Assessment: Encounter Diagnoses  Name Primary?  . Alcoholic  cirrhosis of liver with ascites (Donald Aguilar) Yes  . Chronic hepatitis C without hepatic coma (Donald Aguilar)   . Scrotal edema   . Other ascites   . Secondary esophageal varices without bleeding ([facility])    Recurrence of large volume ascites after TIPS placement with properly functioning TIPS on recent imaging.  I suspect this is due to persistence of portal hypertension and noncompliance with low-sodium restriction and diuretic therapy. I was glad his congregational nurse was here, because I stressed the need for close monitoring of his blood pressure, social situation, and assistance picking up his medications.  They are staff is doing an outstanding job with the resources they have available, but it is still quite difficult with a patient who has little or no resources.  We will do the best we can with medications, sodium restriction and periodic paracentesis if needed.  He does not have tense ascites as I had previously seen does not need a paracentesis now.  I would like to limit that is much as possible because of the risk of introducing infection and also the ongoing protein loss that would cause.  Plan: 1500 mg daily sodium restriction.  I stressed  the importance of this through the interpreter and also wrote that down the back of my business card so he can keep it with him for reference when he is obtaining food. His food insecurity will make that a great challenge.  Furosemide 40 mg once daily and spironolactone 100 mg once daily  Lactulose 20 g (30 mL) once daily, since he currently does not have signs of encephalopathy.  This prophylaxis is still important to prevent encephalopathy because of the TIPS placement.  His congregational nurse tells me she would be able to check his blood pressure weekly and report that to me so I can decide if midodrine must be resumed and whether or not we can increase dose of diuretics.  They will also be able to alert me if he develops tense ascites and possible need for  repeat paracentesis.  Clinic follow-up with me can then be scheduled based on that clinical progress.  Referral to infectious disease clinic for consideration of hepatitis C treatment.  Although he still has decompensated disease, this may be the best we can get him for the foreseeable future, and clearance of hepatitis C, if possible, will decrease the progression of his liver disease.  CMP, alpha-fetoprotein and updated hepatitis C viral load obtained today.  He again needs a truss or other scrotal support device from a medical supply store, and I told the nurse about that as well.  Hopefully those resources can be obtained for him.  50 minutes were spent on this encounter (including chart review, history/exam, counseling/coordination of care, and documentation)  Nelida Meuse III    Addendum  Following lab results obtained later in the morning after patient's clinic visit  CMP Latest Ref Rng & Units 07/20/2020 05/11/2020 05/01/2020  Glucose 70 - 99 mg/dL 56(L) 99 105(H)  BUN 6 - 23 mg/dL 7 11 6(L)  Creatinine 0.40 - 1.50 mg/dL 0.76 0.81 0.86  Sodium 135 - 145 mEq/L 139 137 138  Potassium 3.5 - 5.1 mEq/L 2.6(LL) 3.5 3.8  Chloride 96 - 112 mEq/L 104 105 105  CO2 19 - 32 mEq/L 29 24 24   Calcium 8.4 - 10.5 mg/dL 8.2(L) 8.4(L) 8.5(L)  Total Protein 6.0 - 8.3 g/dL 7.7 7.3 7.5  Total Bilirubin 0.2 - 1.2 mg/dL 2.3(H) 1.7(H) 2.2(H)  Alkaline Phos 39 - 117 U/L 463(H) 66 80  AST 0 - 37 U/L 65(H) 57(H) 50(H)  ALT 0 - 53 U/L 27 27 25    Patient contacted through congregational nurse, who is making arrangements for patient to go to the emergency department for his critically low potassium. I expect he is likely to need hospital admission.  Because of the markedly elevated alkaline phosphatase with only mildly elevated and stable bilirubin, which is new for him, is unknown but certainly needs further evaluation soon.  Madelon Lips, MD

## 2020-07-20 NOTE — Patient Instructions (Addendum)
If you are age 67 or older, your body mass index should be between 23-30. Your Body mass index is 22.81 kg/m. If this is out of the aforementioned range listed, please consider follow up with your Primary Care Provider.  If you are age 40 or younger, your body mass index should be between 19-25. Your Body mass index is 22.81 kg/m. If this is out of the aformentioned range listed, please consider follow up with your Primary Care Provider.   Your provider has requested that you go to the basement level for lab work before leaving today. Press "B" on the elevator. The lab is located at the first door on the left as you exit the elevator.  Due to recent changes in healthcare laws, you may see the results of your imaging and laboratory studies on MyChart before your provider has had a chance to review them.  We understand that in some cases there may be results that are confusing or concerning to you. Not all laboratory results come back in the same time frame and the provider may be waiting for multiple results in order to interpret others.  Please give [place] 48 hours in order for your provider to thoroughly review all the results before contacting the office for clarification of your results.   We will send a referral to the Infectious disease for the Hepatitis C    It was a pleasure to see you today!  Dr. Loletha Carrow

## 2020-07-20 NOTE — Congregational Nurse Program (Signed)
Arranged transportation and accompanied patient to office visit with Dr. Loletha Carrow.  Patient has not been taking his medication.  Following appointment CN picked up Constulose, Furosemide and Spironolactone from [facility] and took it to patient's home.  He took them as prescribed at 11:00 am and agreed to take them every morning.  Advised patient that interpreter [name] and I will home visit him on Wednesday 07/25/2020 to check his BP and see how he is doing. [name] RN, Congregational Nurse. [phone]

## 2020-07-23 ENCOUNTER — Telehealth: Payer: Self-pay

## 2020-07-23 LAB — HEPATITIS C RNA QUANTITATIVE
HCV RNA, PCR, QN (Log): 5.47 log IU/mL — ABNORMAL HIGH
HCV RNA, PCR, QN: 293000 IU/mL — ABNORMAL HIGH

## 2020-07-23 LAB — AFP TUMOR MARKER: AFP-Tumor Marker: 5.8 ng/mL (ref <6.1)

## 2020-07-23 NOTE — Telephone Encounter (Signed)
I have seen the email.  It is disappointing that he chose not to go to the ED, and it will make this management challenging but I will do the best I can.  Hoyle Sauer said she would try to get this patient established with the [facility] health internal medicine clinic for better continuity of care and management of hypokalemia, medicines and other general medical issues.  For now the plan is as follows:  Potassium 60 mEq tablet.  Take 1 tablet/day for a week.  Dispense 7, refill 0  Take ONLY the spironolactone 100 mg tablet daily for the next week and hold off on the furosemide for now (it is fine if he is already taken them both today).  CMP on Monday, 10/11  Also, his labs show he has developed a high alkaline phosphatase level in the last 2 months (previously normal).  I do not know the cause, but we need to arrange a CT scan of the abdomen (no pelvis) with IV contrast.

## 2020-07-23 NOTE — Telephone Encounter (Signed)
Received call from Hoyle Sauer, patient's congregational nurse. She states that she emailed Dr. Loletha Carrow over the weekend to inform him that patient did not go to the ED as directed. She states that she picked up patient's diuretics and he has started on them. She is wanting to know if we will be sending in a RX for potassium or adjusting diuretics. Please advise, thank you

## 2020-07-24 ENCOUNTER — Other Ambulatory Visit: Payer: Self-pay

## 2020-07-24 ENCOUNTER — Telehealth: Payer: Self-pay

## 2020-07-24 DIAGNOSIS — E876 Hypokalemia: Secondary | ICD-10-CM

## 2020-07-24 DIAGNOSIS — K760 Fatty (change of) liver, not elsewhere classified: Secondary | ICD-10-CM

## 2020-07-24 DIAGNOSIS — K7031 Alcoholic cirrhosis of liver with ascites: Secondary | ICD-10-CM

## 2020-07-24 MED ORDER — POTASSIUM CHLORIDE CRYS ER 20 MEQ PO TBCR: 60.0000 meq | EXTENDED_RELEASE_TABLET | Freq: Every day | ORAL | 0 refills | Status: DC

## 2020-07-24 NOTE — Telephone Encounter (Signed)
Will forward to [employer].  RX for Potassium 60 mEq has been sent in to [facility]  on [address].  Patient has been scheduled for a CT Abdomen with contrast at [facility] on 07/31/20 at 8 am, patient must arrive at 7:45 am in the radiology department. NPO 4 hours prior to scan, only drink the contrast. Must pick up contrast 2-3 days before appt. On the day of the scan the patient will drink the first bottle of contrast at 6 am, patient will drink 2nd bottle of contrast at 7 am. If this appointment date or time does not work please call [phone].   Lab order in epic for patient to return on Monday, 10/11. No appt necessary for lab work, they are open between 7:30 am - 5 pm.

## 2020-07-24 NOTE — Congregational Nurse Program (Signed)
CN picked up Potassium HCL from [facility] and delivered it to patient at his residence.  He took 16 MEQ at 11:45 am.  CN also picked up contrast solution from [facility] Radiology Department.  Will home visit tomorrow with interpreter and explain upcoming procedures and appointments as well as check BP, discuss low sodium diet and review medications.  Transportation for appointments will be arranged. [name] RN, Congregational Nurse [phone].

## 2020-07-24 NOTE — Telephone Encounter (Signed)
Phone call to schedule appointment for follow-up of low potassium level per Dr. Loletha Carrow.  Patient is scheduled to see Dr. Coy Saunas on 08/09/2020 at 3:15 pm.  [name] RN, [place] Nurse [phone]

## 2020-07-25 NOTE — Congregational Nurse Program (Signed)
Home visit with interpreter [name] assisting.  BP 104/60.  Patient states he is taking medications as directed.  We reviewed the purpose and dosage of each medication.  We talked at length about sodium explaining he should limit it to 1500 mg or less per day, showing him how to read labels and to use only a pinch of salt when cooking fresh vegetables. Eats very little canned foods but occasional fast foods and crackers.  Advised him of appointment for repeat lab work at [facility] on 07/30/2020 and abdominal CT scan on 07/31/2020. Gave patient contrast material for CT scan and translated instructions. Transportation arranged for both. Also scheduled appointment with PCP at [facility] for 08/09/2020.  [name] RN, Congregational Nurse [phone]

## 2020-07-30 ENCOUNTER — Other Ambulatory Visit (INDEPENDENT_AMBULATORY_CARE_PROVIDER_SITE_OTHER): Payer: Medicare Other

## 2020-07-30 DIAGNOSIS — K7031 Alcoholic cirrhosis of liver with ascites: Secondary | ICD-10-CM

## 2020-07-30 DIAGNOSIS — E876 Hypokalemia: Secondary | ICD-10-CM | POA: Diagnosis not present

## 2020-07-30 LAB — COMPREHENSIVE METABOLIC PANEL
ALT: 22 U/L (ref 0–53)
AST: 54 U/L — ABNORMAL HIGH (ref 0–37)
Albumin: 2.8 g/dL — ABNORMAL LOW (ref 3.5–5.2)
Alkaline Phosphatase: 413 U/L — ABNORMAL HIGH (ref 39–117)
BUN: 12 mg/dL (ref 6–23)
CO2: 28 mEq/L (ref 19–32)
Calcium: 8.7 mg/dL (ref 8.4–10.5)
Chloride: 105 mEq/L (ref 96–112)
Creatinine, Ser: 0.88 mg/dL (ref 0.40–1.50)
GFR: 88.48 mL/min (ref >60.00)
Glucose, Bld: 104 mg/dL — ABNORMAL HIGH (ref 70–99)
Potassium: 4.1 mEq/L (ref 3.5–5.1)
Sodium: 138 mEq/L (ref 135–145)
Total Bilirubin: 2.1 mg/dL — ABNORMAL HIGH (ref 0.2–1.2)
Total Protein: 7.8 g/dL (ref 6.0–8.3)

## 2020-07-31 ENCOUNTER — Telehealth: Payer: Self-pay | Admitting: Gastroenterology

## 2020-07-31 ENCOUNTER — Telehealth: Payer: Self-pay

## 2020-07-31 ENCOUNTER — Ambulatory Visit (HOSPITAL_COMMUNITY): Payer: Medicare Other

## 2020-07-31 NOTE — Telephone Encounter (Signed)
Spoke with patient's congregational nurse [name], called to inform us that patient missed his CT scan appt today. She states that she was with patient yesterday and reviewed instructions and prep. She states that transportation was arranged by Medicare - they called her and told her that they arrived early this morning and no one came to the door. She states that patient called radiology this morning and said nobody picked him up. She called radiology to see if they could still see him today if she brought him in but she called him and he stated that he had already eaten because he was hungry. Advised that the order is still good and that they will just have to call and get it rescheduled. She states that she will visit patient tomorrow with interpreter, she will keep me updated.

## 2020-07-31 NOTE — Telephone Encounter (Signed)
[place] Nurse from [facility] is requesting a call back from a nurse to discuss the pt missing his CT scan appt this morning.   CB [phone]

## 2020-07-31 NOTE — Telephone Encounter (Signed)
Patient called with questions concerning tomorrow's CT scan. CN requested interpreter [name] call patient.  She gave him the following information.  He should not eat or drink anything after midnight. He should drink the first bottle of contrast solution at 6;00 am and the second at 7:00 am.  Medicaid transportation will be picking him up between 6:30 and 7:00 am so he may need to take the second bottle with him and drink it on the way.  He expressed understanding.  [name] RN, Congregational Nurse [phone]

## 2020-08-01 NOTE — Congregational Nurse Program (Signed)
     Home visit with interpreter Donald Aguilar.  States he missed appointment yesterday for CT scan because no one picked him up but also had not drank contrast solution and seemed confused about instructions despite being explained at least 3 times by interpreter.  States he does want to reschedule. He was informed about appointment on 08/09/2020 with PCP at [facility] Internal Medicine clinic to follow-up on low potassium.  He is taking medications as directed and finished Potassium and Constulose.  CN will contact Dr. Loletha Carrow to see if he wants patient to continue Constulose.  BP 96/60.  [name] RN, Congregational Nurse [phone]

## 2020-08-02 ENCOUNTER — Telehealth: Payer: Self-pay | Admitting: Gastroenterology

## 2020-08-02 NOTE — Telephone Encounter (Signed)
Donald Aguilar,   Please see recommendations per Dr. Loletha Carrow below.  Let me know if you have any questions  Thank you

## 2020-08-02 NOTE — Telephone Encounter (Signed)
-----   Message from [name], RN sent at 08/02/2020  4:06 PM EDT ----- Regarding: Mr. Lagrand Home visit to Mr. [place] yesterday 10/13.  His BP was 96/60.  He is taking his medication as prescribed and finished the potassium and constulose.  Do you want him to refill the constalose or wait until after his visit next week with PCP?  I rescheduled his CT scan for 08/16/2020 at 1:00 pm and will make every effort to assure it gets done this time.  Thanks, Hoyle Sauer

## 2020-08-02 NOTE — Telephone Encounter (Signed)
Please see the update I rec'd from this patient's congregational nurse, [name].  (It came to me and a staff message, which I copied into this message.  I am certainly grateful for the updates, however in the future it would be most helpful and best documentation for them to be in a telephone note or she can route her Epic progress notes to me)  Yes I want him to take the lactulose at the same dose, please refill it if necessary.  I reviewed the 07/30/2020 CMP, the potassium is normal, renal function normal and liver labs stable.  Continue spironolactone 100 mg daily but do not take the furosemide 40 mg daily.  His blood pressure is too low to tolerate the furosemide.  Hopefully he will attend the appointment for the CT scan.  I appreciate the efforts of his care team.

## 2020-08-08 NOTE — Congregational Nurse Program (Signed)
Home visit with interpreter [name] assisting.  BP 102/60.  Picked up constulose and took it to patient.  Also old him to stop Furosemide. Advised that a scrotal support had been ordered and is available for pick-up today. He expressed desire to try it.  CN will take it to him tomorrow at his Internal Medicine Clinic appointment.  Told patient his CT scan has been rescheduled for 08/16/2020 and that we will revisit him on 10/27 to review instructions.  [name] RN, Congregational Nurse [phone]

## 2020-08-09 ENCOUNTER — Ambulatory Visit (INDEPENDENT_AMBULATORY_CARE_PROVIDER_SITE_OTHER): Payer: Medicare Other | Admitting: Internal Medicine

## 2020-08-09 ENCOUNTER — Encounter: Payer: Self-pay | Admitting: Internal Medicine

## 2020-08-09 ENCOUNTER — Other Ambulatory Visit: Payer: Self-pay

## 2020-08-09 VITALS — BP 98/57 | HR 74 | Temp 98.6°F | Ht 64.75 in | Wt 131.6 lb

## 2020-08-09 DIAGNOSIS — Z23 Encounter for immunization: Secondary | ICD-10-CM | POA: Diagnosis present

## 2020-08-09 DIAGNOSIS — B182 Chronic viral hepatitis C: Secondary | ICD-10-CM

## 2020-08-09 DIAGNOSIS — M7989 Other specified soft tissue disorders: Secondary | ICD-10-CM | POA: Diagnosis not present

## 2020-08-09 DIAGNOSIS — K7469 Other cirrhosis of liver: Secondary | ICD-10-CM

## 2020-08-09 DIAGNOSIS — R5381 Other malaise: Secondary | ICD-10-CM

## 2020-08-09 DIAGNOSIS — R42 Dizziness and giddiness: Secondary | ICD-10-CM

## 2020-08-09 MED ORDER — LACTULOSE 10 GM/15ML PO SOLN: 20.0000 g | Freq: Every day | ORAL | 3 refills | Status: DC

## 2020-08-09 MED ORDER — SPIRONOLACTONE 100 MG PO TABS: 100.0000 mg | ORAL_TABLET | Freq: Every day | ORAL | 3 refills | Status: DC

## 2020-08-09 MED ORDER — FUROSEMIDE 40 MG PO TABS: 40.0000 mg | ORAL_TABLET | Freq: Every day | ORAL | 3 refills | Status: DC

## 2020-08-09 NOTE — Progress Notes (Signed)
CC: Swelling  HPI: Donald Aguilar is a 69 y.o. with PMH listed below presenting with complaint of swelling. Please see problem based assessment and plan for further details.  Past Medical History:  Diagnosis Date  . 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 GERD (gastroesophageal reflux disease)   . Hepatitis C    Genotype 1a  . 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 Has Niece nearby. Mentions smoking 1/4 pack daily. 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 Denies any illicit drug use.  Review of Systems: Review of Systems  Constitutional: Positive for malaise/fatigue. Negative for chills and fever.  HENT: Negative for hearing loss.   Respiratory: Negative for cough and shortness of breath.   Cardiovascular: Positive for leg swelling. Negative for chest pain and palpitations.  Gastrointestinal: Negative for constipation, diarrhea, nausea and vomiting.  Musculoskeletal: Negative for back pain and joint pain.  Neurological: Positive for dizziness. Negative for weakness and headaches.  All other systems reviewed and are negative.    Physical Exam: Vitals:   08/09/20 1519  BP: (!) 98/57  Pulse: 74  Temp: 98.6 F (37 C)  TempSrc: Oral  SpO2: 99%  Weight: 131 lb 9.6 oz (59.7 kg)  Height: 5' 4.75" (1.645 m)   Gen: Well-developed, chronically ill-appearing, NAD HEENT: NCAT head, hearing intact CV: RRR, S1, S2 normal Pulm: CTAB, No rales, no wheezes Abd: BS+, Non-tender, Distended w/o fluid shift GU: + scrotal swelling without tenderness, erythema Extm: ROM intact, Peripheral pulses intact, No peripheral edema Skin: Dry, Warm, normal turgor, no wounds, no rashes, no lesions  Assessment & Plan:   Chronic hepatitis C without hepatic coma (HCC) Lab Results    Component Value Date   HCVAB Reactive (A) 09/09/2019   Confirmed hep C. Previously seen by Dr.Comer. Was referred to hepatology for transplant evaluation in the past but was lost to follow up due to multiple no-shows (5x) for initial evaluation. Per chart review, Dr.Danis with GI recommend re-connecting with ID to start treatment.  - F/u with ID  Cirrhosis of liver (HCC) Donald Aguilar is a 69 yo M w/ PMH of hepatitis C cirrhosis presenting to [facility] to establish care. He was evaluated with in-person interpreter and congregational nurse. He was in his usual state of health until last year when he was noted to have worsening scrotal and lower extremity edema. He was diagnosed with decompensated cirrhosis and was started on treatment with lactulose, furosemide. He was found to be hepatitis C as well being referred to GI and IR for multiple paracentesises as well as completing TIPS procedure. He was noted to be hypokalemic on the most recent lab and referred to [facility] to establish care for more regular follow up. He mentions taking his spironolactone as prescribed. He stopped taking his lactulose due to light-headedness but continues to have daily bowel movements. He denies any tremors, hematemesis or melena.  A/P Present to establish care. Cirrhosis appear compensated today. MELD score of 11. Referred by Dr.Comer to hepatology for transplant evaluation but was lost to follow up and referral process cancelled due to no-shows. Chart review shows multiple occurrences of medical non-adherence as well as leaving AMA on recent hospitalization. Complicated by transportation issues, language barrier and low health literacy. Will not be able to follow up regularly with transplant team  due to these socioeconomic barriers. High risk for poor outcome in setting of above issues. Will continue to treat supportively. - CMP today - C/w hold furosemide until lab check, c/w spironolactone - Lactulose prn to keep 2-3 bowel  movements daily - Advised on low salt diet   Need for immunization against influenza Agrees to receive influenza vaccine this visit    Patient discussed with Dr. Dareen Piano  -Gilberto Better, [place] [facility] Pager: [phone]

## 2020-08-09 NOTE — Patient Instructions (Signed)
Thank you for allowing [place] to provide your care today. Today we discussed your cirrhosis    I have ordered cmp labs for you. I will call if any are abnormal.    Today we made no changes to your medications. I will call to discuss appropriateness of restarting the furosemide  Please make sure to follow up with Dr.Comer's office to start treatment for hepatitis C.  Should you have any questions or concerns please call the internal medicine clinic at [phone].     Hepatitis C  Hepatitis C is a liver infection that is caused by a virus. Many people have no symptoms or only mild symptoms. Hepatitis C is contagious. This means that it can spread from person to person. Over time, the infection can lead to serious liver problems. What are the causes? This condition is caused by a virus. People can get this infection through:  Contact with certain body fluids of someone who has the virus. This includes: ? Blood. ? Semen or vaginal fluids.  Childbirth. A woman can pass the virus to her baby during birth.  Blood or organ donations done in the [place] before 1992. What increases the risk? The following things may make you more likely to get this condition:  Having contact with needles or syringes that have the virus on them. This may happen while: ? Getting a treatment that involves putting thin needles through your skin (acupuncture). ? Getting a tattoo. ? Getting a body piercing. ? Injecting drugs.  Having sex with someone who has the virus. The virus can be passed through vaginal, oral, or anal sex.  Getting treatment to filter your blood (kidney dialysis).  Having HIV or AIDS.  Having a job that involves contact with blood or certain other body fluids, such as in health care. What are the signs or symptoms? Symptoms of this condition include:  Tiredness (fatigue).  Not wanting to eat as much as normal (loss of appetite).  Feeling like you may vomit  (nauseous).  Vomiting.  Pain in your belly (abdomen).  Dark yellow pee (urine).  Your skin or the white parts of your eyes turning yellow (jaundice).  Itchy skin.  Light-colored or tan poop (stool).  Joint pain.  Bleeding and bruising that happen often.  Fluid building up in your stomach (ascites). Often, there are no symptoms. How is this treated? Treatment may depend on how bad your condition is, how long it has lasted, and whether you have liver damage. More testing may be done to figure out the best treatment. Treatment may include:  Taking antiviral medicines and other medicines.  Having follow-up treatments every 6-12 months for infections or other liver problems.  Getting a new liver from a donor (liver transplant). Follow these instructions at home: Medicines  Take over-the-counter and prescription medicines only as told by your doctor.  If you were prescribed an antiviral medicine, take it as told by your doctor. Do not stop using the antiviral even if you start to feel better.  Do not take any new medicines unless your doctor says that this is okay. This includes over-the-counter medicines and supplements. Activity  Rest as needed.  Do not have sex until your doctor says this is okay.  Return to your normal activities as told by your doctor. Ask your doctor what activities are safe for you.  Ask your doctor when you may go back to school or work. Eating and drinking   Eat a balanced diet. Eat plenty of: ?  Fruits and vegetables. ? Whole grains. ? Low-fat (lean) meats or non-meat proteins, such as beans or tofu.  Drink enough fluids to keep your pee pale yellow.  Do not drink alcohol. General instructions  Do not share toothbrushes, nail clippers, or razors.  Wash your hands often with soap and water for at least 20 seconds. If you do not have soap and water, use hand sanitizer.  Cover any cuts or open sores on your skin.  Avoid swimming or  using hot tubs if you have open sores or wounds.  Keep all follow-up visits as told by your doctor. This is important. You may need follow-up visits every 6-12 months. How is this prevented?  Wash your hands often with soap and water for at least 20 seconds.  Do not share needles or syringes.  Use a condom every time you have vaginal, oral, or anal sex. Be sure to use it the right way each time. ? Both females and males should wear condoms. ? Condoms should be kept in place from the start to the end of sexual activity. ? Latex condoms should be used, if possible.  Do not handle blood or body fluids without gloves or other protection.  Avoid getting tattoos or piercings in places that are not clean. Where to find more information  Centers for Disease Control and Prevention: http://www.wolf.info/ Contact a doctor if:  You have a fever.  You have belly pain.  Your pee is dark.  Your poop is a light color or tan.  You have joint pain. Get help right away if:  You feel more and more tired.  You feel more and more weak.  You do not feel like eating.  You cannot eat or drink without vomiting.  Your skin or the whites of your eyes turn yellow or turn more yellow than they were before.  You bruise or bleed easily. Summary  Hepatitis C is a liver infection that is caused by a virus.  Over time, the infection can lead to serious liver problems.  The virus can be spread from person to person through blood, semen, or vaginal fluids.  Do not take any new medicines unless your doctor says that this is okay. This information is not intended to replace advice given to you by your health care provider. Make sure you discuss any questions you have with your health care provider. Document Revised: 06/29/2019 Document Reviewed: 06/06/2019 Elsevier Patient Education  [place].

## 2020-08-10 ENCOUNTER — Encounter: Payer: Self-pay | Admitting: Internal Medicine

## 2020-08-10 DIAGNOSIS — Z23 Encounter for immunization: Secondary | ICD-10-CM | POA: Insufficient documentation

## 2020-08-10 LAB — CMP14 + ANION GAP
ALT: 30 IU/L (ref 0–44)
AST: 67 IU/L — ABNORMAL HIGH (ref 0–40)
Albumin/Globulin Ratio: 0.6 — ABNORMAL LOW (ref 1.2–2.2)
Albumin: 2.9 g/dL — ABNORMAL LOW (ref 3.8–4.8)
Alkaline Phosphatase: 432 IU/L — ABNORMAL HIGH (ref 44–121)
Anion Gap: 12 mmol/L (ref 10.0–18.0)
BUN/Creatinine Ratio: 15 (ref 10–24)
BUN: 11 mg/dL (ref 8–27)
Bilirubin Total: 1.4 mg/dL — ABNORMAL HIGH (ref 0.0–1.2)
CO2: 23 mmol/L (ref 20–29)
Calcium: 8.3 mg/dL — ABNORMAL LOW (ref 8.6–10.2)
Chloride: 101 mmol/L (ref 96–106)
Creatinine, Ser: 0.71 mg/dL — ABNORMAL LOW (ref 0.76–1.27)
GFR calc Af Amer: 112 mL/min/{1.73_m2} (ref >59)
GFR calc non Af Amer: 97 mL/min/{1.73_m2} (ref >59)
Globulin, Total: 4.5 g/dL (ref 1.5–4.5)
Glucose: 93 mg/dL (ref 65–99)
Potassium: 3.6 mmol/L (ref 3.5–5.2)
Sodium: 136 mmol/L (ref 134–144)
Total Protein: 7.4 g/dL (ref 6.0–8.5)

## 2020-08-10 NOTE — Assessment & Plan Note (Signed)
Lab Results  Component Value Date   HCVAB Reactive (A) 09/09/2019   Confirmed hep C. Previously seen by Dr.Comer. Was referred to hepatology for transplant evaluation in the past but was lost to follow up due to multiple no-shows (5x) for initial evaluation. Per chart review, Dr.Danis with GI recommend re-connecting with ID to start treatment.  - F/u with ID

## 2020-08-10 NOTE — Assessment & Plan Note (Addendum)
Donald Aguilar is a 69 yo M w/ PMH of hepatitis C cirrhosis presenting to [facility] to establish care. He was evaluated with in-person interpreter and congregational nurse. He was in his usual state of health until last year when he was noted to have worsening scrotal and lower extremity edema. He was diagnosed with decompensated cirrhosis and was started on treatment with lactulose, furosemide. He was found to be hepatitis C as well being referred to GI and IR for multiple paracentesises as well as completing TIPS procedure. He was noted to be hypokalemic on the most recent lab and referred to [facility] to establish care for more regular follow up. He mentions taking his spironolactone as prescribed. He stopped taking his lactulose due to light-headedness but continues to have daily bowel movements. He denies any tremors, hematemesis or melena.  A/P Present to establish care. Cirrhosis appear compensated today. MELD score of 11. Referred by Dr.Comer to hepatology for transplant evaluation but was lost to follow up and referral process cancelled due to no-shows. Chart review shows multiple occurrences of medical non-adherence as well as leaving AMA on recent hospitalization. Complicated by transportation issues, language barrier and low health literacy. Will not be able to follow up regularly with transplant team due to these socioeconomic barriers. High risk for poor outcome in setting of above issues. Will continue to treat supportively. - CMP today - C/w hold furosemide until lab check, c/w spironolactone - Lactulose prn to keep 2-3 bowel movements daily - Advised on low salt diet

## 2020-08-10 NOTE — Assessment & Plan Note (Signed)
Agrees to receive influenza vaccine this visit

## 2020-08-13 NOTE — Progress Notes (Signed)
Internal Medicine Clinic Attending  Case discussed with Dr. Lee  At the time of the visit.  We reviewed the resident's history and exam and pertinent patient test results.  I agree with the assessment, diagnosis, and plan of care documented in the resident's note.    

## 2020-08-15 NOTE — Congregational Nurse Program (Signed)
Home visit with interpreter [name].  Reviewed instructions for tomorrow's CT scan.  Patient states he understands and will keep appointment.  Medicaid transportation arranged.  BP 94/60.  Taking medications as ordered.  Refills called in for Furosemide, spironolactone and constulose.  [name] RN, Congregational Nurse [phone]

## 2020-08-16 ENCOUNTER — Other Ambulatory Visit: Payer: Self-pay

## 2020-08-16 ENCOUNTER — Telehealth: Payer: Self-pay

## 2020-08-16 ENCOUNTER — Ambulatory Visit (HOSPITAL_COMMUNITY)
Admission: RE | Admit: 2020-08-16 | Discharge: 2020-08-16 | Disposition: A | Discharge status: Home / Self Care | Payer: Medicare Other | Source: Ambulatory Visit | Attending: Gastroenterology | Admitting: Gastroenterology

## 2020-08-16 DIAGNOSIS — K7031 Alcoholic cirrhosis of liver with ascites: Secondary | ICD-10-CM | POA: Insufficient documentation

## 2020-08-16 DIAGNOSIS — K760 Fatty (change of) liver, not elsewhere classified: Secondary | ICD-10-CM | POA: Diagnosis present

## 2020-08-16 IMAGING — CT CT ABDOMEN W/ CM
3 of 11 series · 11 of 46 positions shown, 17 images · IV contrast (OMNIPAQUE)
Comparison: [DATE]

CLINICAL DATA: Cirrhosis, metastatic WEBBER, right-sided abdominal pain

EXAM:
CT ABDOMEN WITH CONTRAST
TECHNIQUE: Multidetector CT imaging of the abdomen was performed using the
standard protocol following bolus administration of intravenous
contrast.
CONTRAST:  100mL OMNIPAQUE IOHEXOL 300 MG/ML  SOLN

[Series 2: axial arterial · axial · arterial · 0.64mm/px · z∈[+1185,+1251]mm · 3 of 89 slices shown]
[im 12/89  soft-tissue]
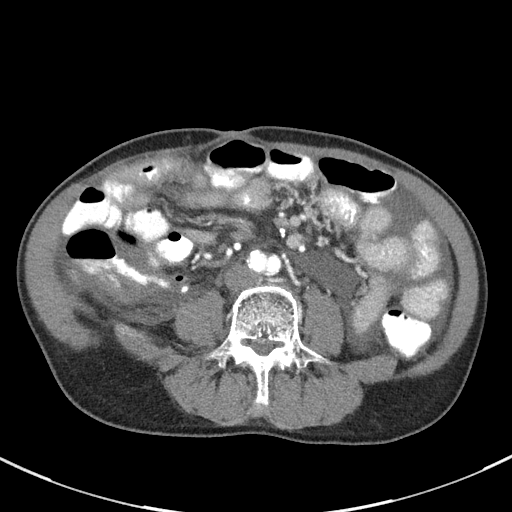
[im 23/89  soft-tissue]
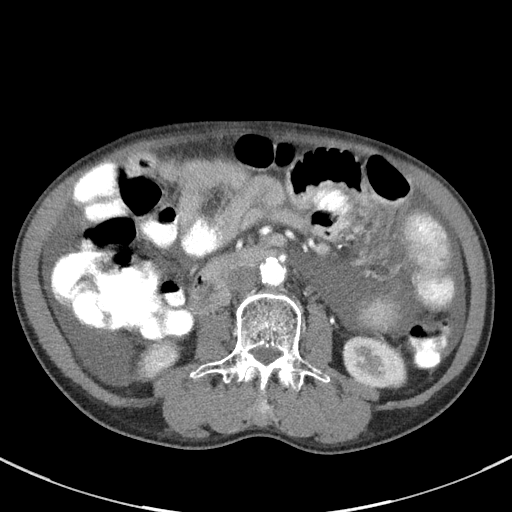
[im 34/89  soft-tissue]
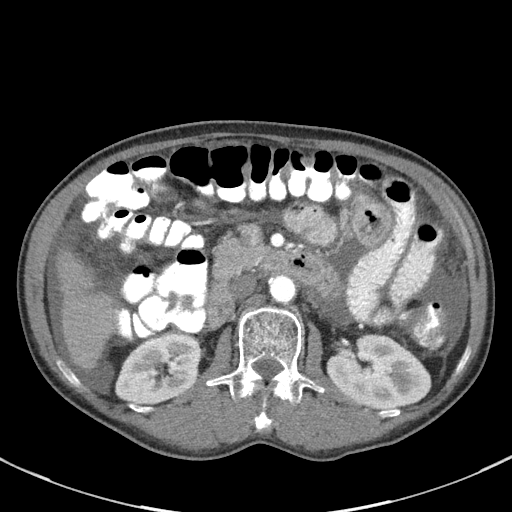

[Series 5: coronal arterial · coronal · arterial · 0.54mm/px · 2 of 80 slices shown, 3 images]
[im 27/80  soft-tissue]
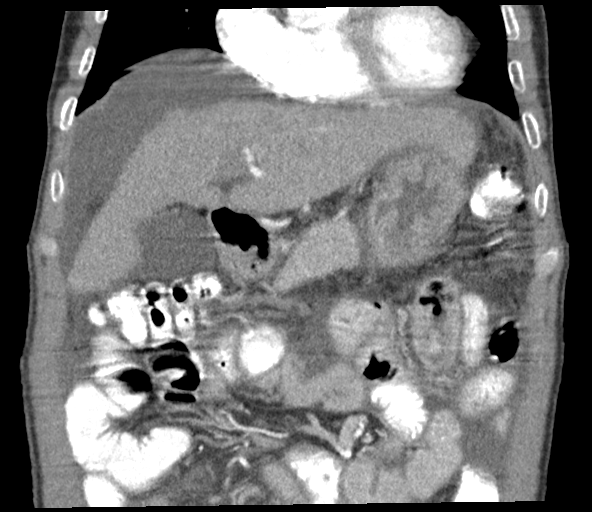
[im 27/80  bone]
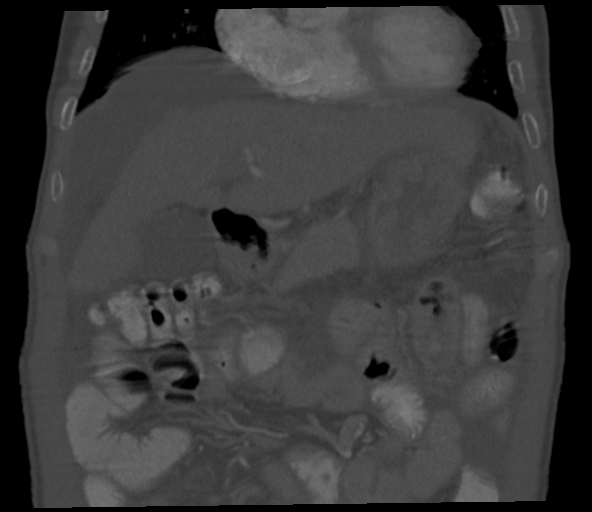
[im 53/80  soft-tissue]
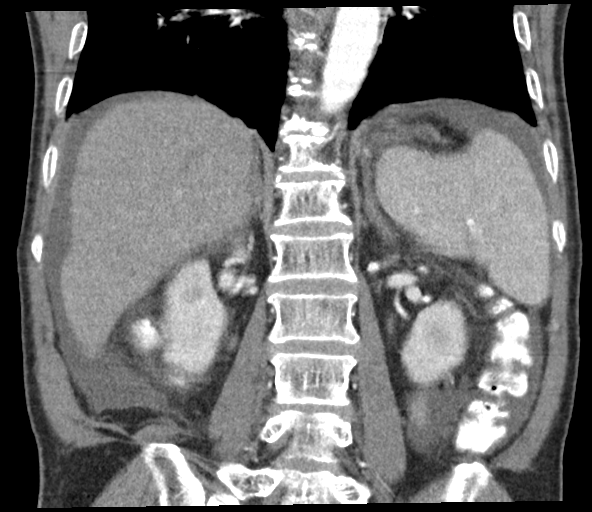

[Series 7: axial venous · axial · portal-venous · 0.64mm/px · z∈[+1188,+1377]mm · 6 of 89 slices shown, 11 images]
[im 13/89  soft-tissue]
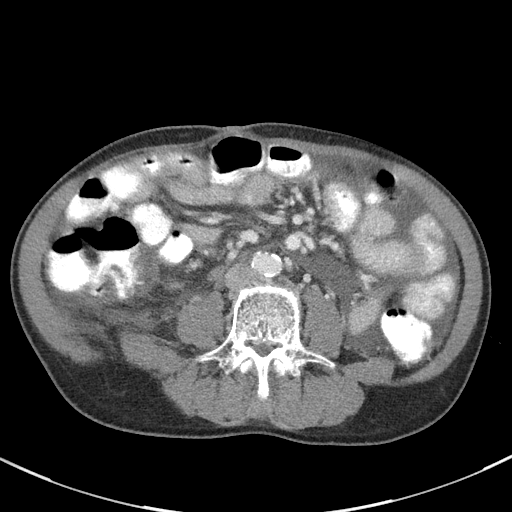
[im 13/89  bone]
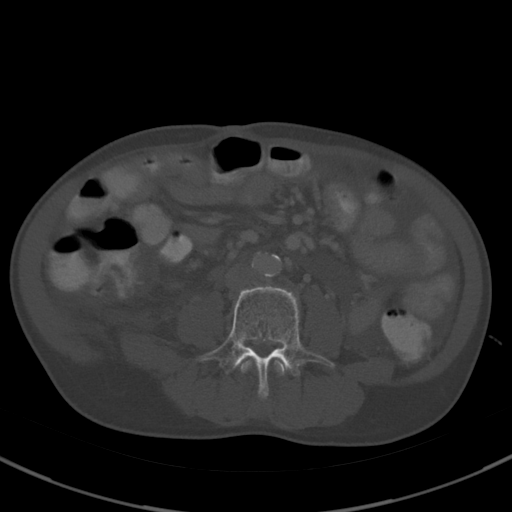
[im 26/89  soft-tissue]
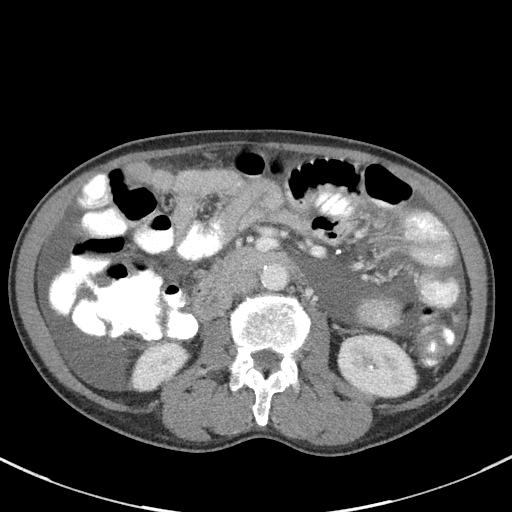
[im 38/89  soft-tissue]
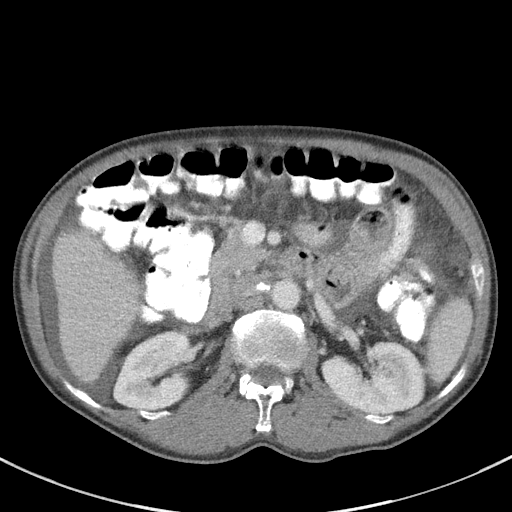
[im 38/89  lung]
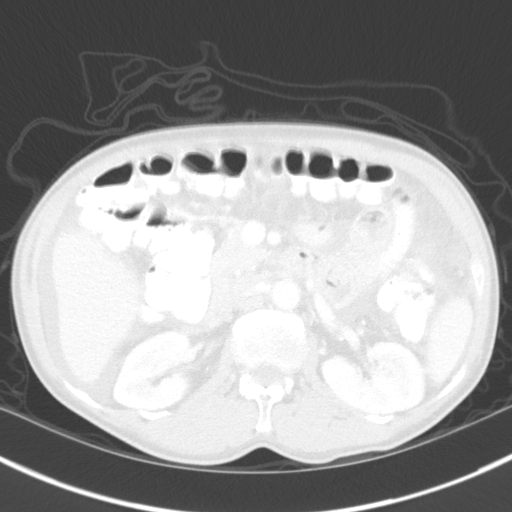
[im 51/89  soft-tissue]
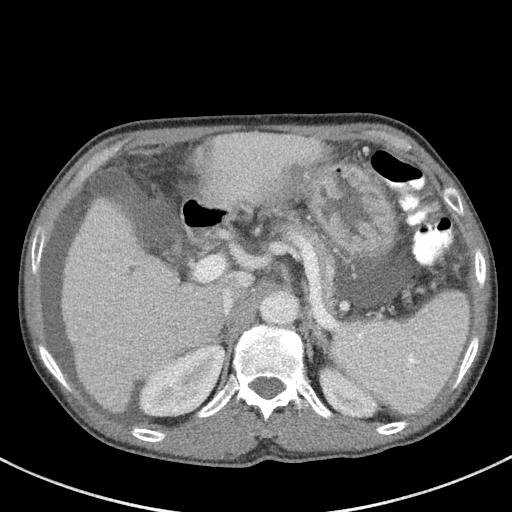
[im 51/89  lung]
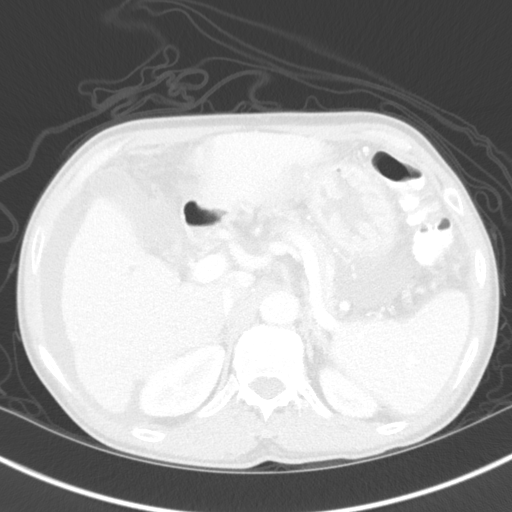
[im 63/89  soft-tissue]
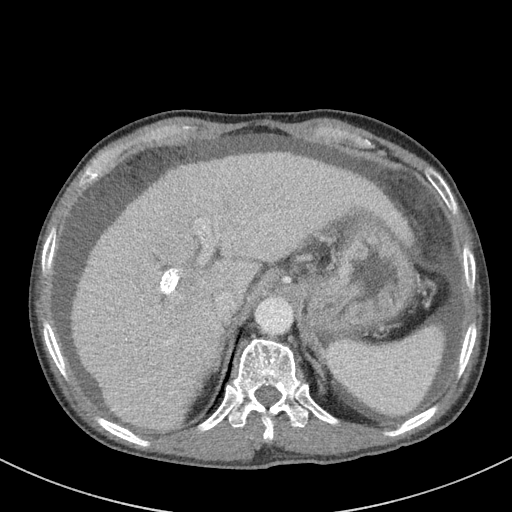
[im 63/89  lung]
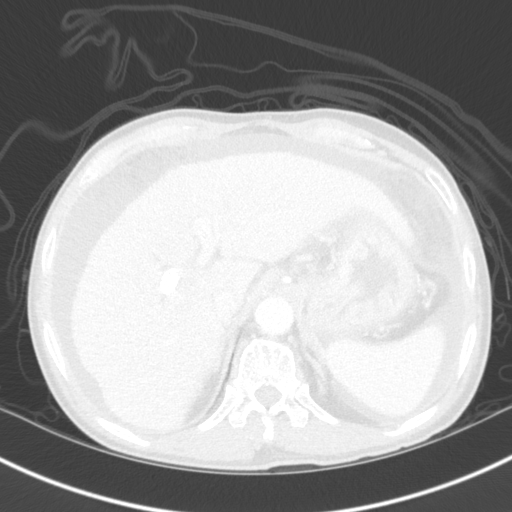
[im 76/89  soft-tissue]
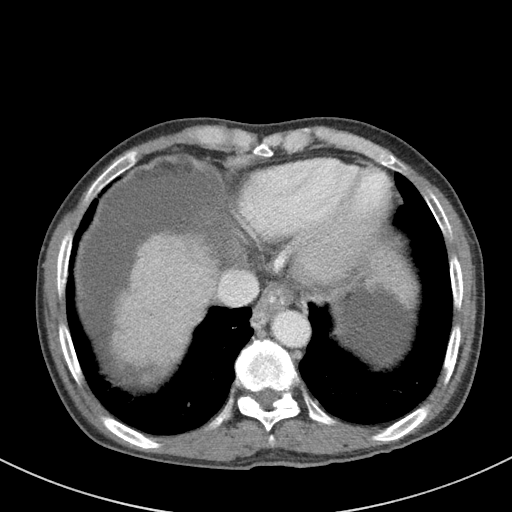
[im 76/89  lung]
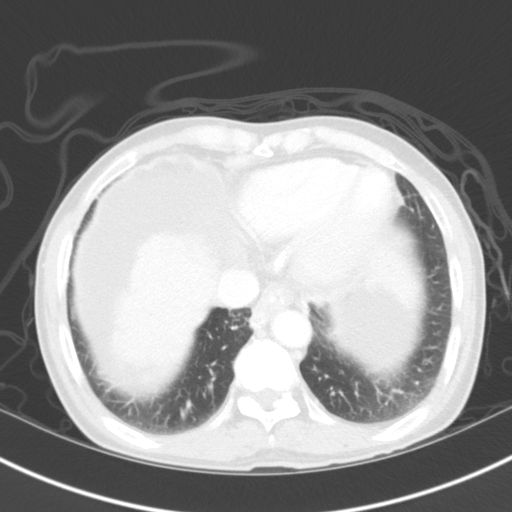

[11 of 46 positions shown; findings below may reference images not displayed]

FINDINGS: Lower chest: No acute abnormality.

Hepatobiliary: Cirrhotic morphology of the liver. TIPS of the right
hepatic vein. There is an arterially hyperenhancing lesion of the
medial left lobe of the liver, hepatic segment IV, measuring 1.2 x
1.0 cm, significantly enlarged compared to prior examination dated
[DATE], at which time it measured approximately 0.7 x 0.6 cm
(series 2, image 21). This lesion further demonstrates washout
(series 12, image 12). There is a new arterially hyperenhancing
lesion of the central liver dome, hepatic segment VIII, measuring
1.1 x 1.0 cm (series 2, image 19). There is some suggestion of
subtle washout (series 12, image 11). Very subtle foci of punctuate
arterial hyperenhancement of the medial anterior liver dome, hepatic
segment VIII, are unchanged, measuring no greater than 4 mm (series
2, image 21). Mildly distended gallbladder containing gallstones
dependently, appearance unchanged compared to prior examination. No
biliary ductal dilatation. No gallbladder wall thickening, or
biliary dilatation.

Pancreas: Unremarkable. No pancreatic ductal dilatation or
surrounding inflammatory changes.

Spleen: Normal in size without focal abnormality.

Adrenals/Urinary Tract: Adrenal glands are unremarkable. Kidneys are
normal, without renal calculi, focal lesion, or hydronephrosis.
Bladder is unremarkable.

Stomach/Bowel: Stomach is within normal limits. Appendix appears
normal. No evidence of bowel wall thickening, distention, or
inflammatory changes.

Vascular/Lymphatic: Aortic atherosclerosis. No enlarged abdominal
lymph nodes.

Other: No abdominal wall hernia or abnormality. Moderate volume
ascites throughout the abdomen and pelvis.

Musculoskeletal: No acute or significant osseous findings.
IMPRESSION: 1. Cirrhotic morphology of the liver with TIPS of the right hepatic
vein. The portal and hepatic veins as well as the TIPS appear
patent.
2. No acute findings of the abdomen to explain pain.
3. There is an arterially hyperenhancing lesion of the medial left
lobe of the liver, hepatic segment IV, measuring 1.2 x 1.0 cm,
significantly enlarged compared to prior examination dated
[DATE]. This lesion further demonstrates washout. WEBBER
category 5, presumed hepatocellular carcinoma.
4. There is a new arterially hyperenhancing lesion of the central
liver dome, hepatic segment VIII, measuring 1.1 x 1.0 cm. There is
some suggestion of subtle washout. WEBBER category 5, presumed
hepatocellular carcinoma.
5. Very subtle foci of punctuate arterial hyperenhancement of the
medial anterior liver dome, hepatic segment VIII, are unchanged,
measuring no greater than 4 mm. WEBBER category 3, intermediate
probability for hepatocellular carcinoma. Attention on follow-up.
6. Moderate volume ascites throughout the abdomen and pelvis.
7. Cholelithiasis without evidence of cholecystitis.
8. Aortic Atherosclerosis ([KM]-[KM]).

These results will be called to the ordering clinician or
representative by the Radiologist Assistant, and communication
documented in the PACS or [REDACTED].

## 2020-08-16 MED ORDER — IOHEXOL 300 MG/ML  SOLN
100.0000 mL | Freq: Once | INTRAMUSCULAR | Status: AC | PRN
  Administered 2020-08-16: 100 mL via INTRAVENOUS

## 2020-08-16 NOTE — Telephone Encounter (Signed)
Phone call to [facility] for Infectious disease.  Scheduled appointment with Dr. Linus Salmons for 09/11/2020 at 10:45 am.  CN will arrange Medicaid transportation.  [name] RN, Congregational Nurse [phone]

## 2020-08-16 NOTE — Telephone Encounter (Signed)
Received a call from [facility] with radiology for a call report of recent CT abdomen with contrast.  IMPRESSION: 1. Cirrhotic morphology of the liver with TIPS of the right hepatic vein. The portal and hepatic veins as well as the TIPS appear patent. 2. No acute findings of the abdomen to explain pain. 3. There is an arterially hyperenhancing lesion of the medial left lobe of the liver, hepatic segment IV, measuring 1.2 x 1.0 cm, significantly enlarged compared to prior examination dated 03/02/2020. This lesion further demonstrates washout. LI-RADS category 5, presumed hepatocellular carcinoma. 4. There is a new arterially hyperenhancing lesion of the central liver dome, hepatic segment VIII, measuring 1.1 x 1.0 cm. There is some suggestion of subtle washout. LI-RADS category 5, presumed hepatocellular carcinoma. 5. Very subtle foci of punctuate arterial hyperenhancement of the medial anterior liver dome, hepatic segment VIII, are unchanged, measuring no greater than 4 mm. LI-RADS category 3, intermediate probability for hepatocellular carcinoma. Attention on follow-up. 6. Moderate volume ascites throughout the abdomen and pelvis. 7. Cholelithiasis without evidence of cholecystitis. 8. Aortic Atherosclerosis (ICD10-I70.0).

## 2020-08-17 ENCOUNTER — Telehealth: Payer: Self-pay

## 2020-08-17 ENCOUNTER — Other Ambulatory Visit: Payer: Self-pay

## 2020-08-17 ENCOUNTER — Telehealth: Payer: Self-pay | Admitting: Internal Medicine

## 2020-08-17 DIAGNOSIS — K7031 Alcoholic cirrhosis of liver with ascites: Secondary | ICD-10-CM

## 2020-08-17 NOTE — Telephone Encounter (Signed)
Received a call from [name] with BP readings.  08/08/2020 - 102/60 08/09/2020 - 98/57 - PCP office visit 08/15/2020 - 94/60

## 2020-08-17 NOTE — Telephone Encounter (Signed)
Attempted to discuss Donald Aguilar's lab results with his congregational nurse. Phone did not pick up. Left voicemail with callback number.

## 2020-08-17 NOTE — Telephone Encounter (Signed)
Understood, thanks.  This means we cannot increase the dose of his diuretics.  You must communicate with Ms. O'Brien Acupuncturist) with the following 2 pieces of information:  1 -I would like to set him up for therapeutic paracentesis to give some relief of the ascites.  5 L maximum, give 50 g of IV albumin.   2 -(regarding the call report on CT scan)   there are at least 2 spots on the liver suspicious for possible liver cancer.  I have messaged the radiologist who knows this patient from TIPS placement, and asked them to review the scan and see if they think biopsy is necessary and feasible.  We will let her know when I get an answer on that.  - HD

## 2020-08-17 NOTE — Telephone Encounter (Signed)
Spoke with [name], congregational RN regarding Dr. Corena Pilgrim recommendations. Advised that we cannot increase diuretics at this time due to low BP readings.  Paracentesis has been scheduled at [facility] on 08/23/20 at 10 AM. 3 days needed to confirm transportation.   Discussed call report regarding CT scan with Hoyle Sauer, she is aware that Dr. Loletha Carrow has reached out to radiologist who performed TIPS placement to review the scan as well to help determine if a biopsy is necessary, she is aware that we will let her know once we hear back.  Lm on vm for [name], RN to return call for patient's appt information.

## 2020-08-17 NOTE — Telephone Encounter (Signed)
Spoke with Hoyle Sauer, she is aware of patient's IR paracentesis appt.

## 2020-08-20 ENCOUNTER — Ambulatory Visit: Payer: Medicare Other | Admitting: Gastroenterology

## 2020-08-22 NOTE — Congregational Nurse Program (Signed)
Home visit with interpreter [name].  Patient states he has been walking short distances outside.  He is using scrotal truss which is helpful for edema.  Taking medication as prescribed.  BP 104/60.  Reminded him of appointment at [facility] tomorrow for paracentesis.  Transportation arranged thru Medicaid.  [name] RN, Congregational Nurse [phone]

## 2020-08-23 ENCOUNTER — Ambulatory Visit (HOSPITAL_COMMUNITY)
Admission: RE | Admit: 2020-08-23 | Discharge: 2020-08-23 | Disposition: A | Discharge status: Home / Self Care | Payer: Medicare Other | Source: Ambulatory Visit | Attending: Gastroenterology | Admitting: Gastroenterology

## 2020-08-23 ENCOUNTER — Telehealth: Payer: Self-pay

## 2020-08-23 ENCOUNTER — Other Ambulatory Visit: Payer: Self-pay

## 2020-08-23 DIAGNOSIS — K7031 Alcoholic cirrhosis of liver with ascites: Secondary | ICD-10-CM

## 2020-08-23 HISTORY — PX: IR PARACENTESIS: IMG2679

## 2020-08-23 IMAGING — US IR PARACENTESIS
1 series · 2 of 2 positions shown · non-contrast
Comparison: none

INDICATION: Cirrhosis, assess for ascites

EXAM:
U/S abdomen limited ascites
MEDICATIONS:
1% lidocaine local
COMPLICATIONS:
None.
PROCEDURE:
Survey ultrasound of the abdominal 4 quadrants demonstrates only a
trace amount of ascites. Procedure not performed.

[Series 1: ir (id) (id)/(id)/(id) ir · 2 of 2 slices shown]
[im 1/2]
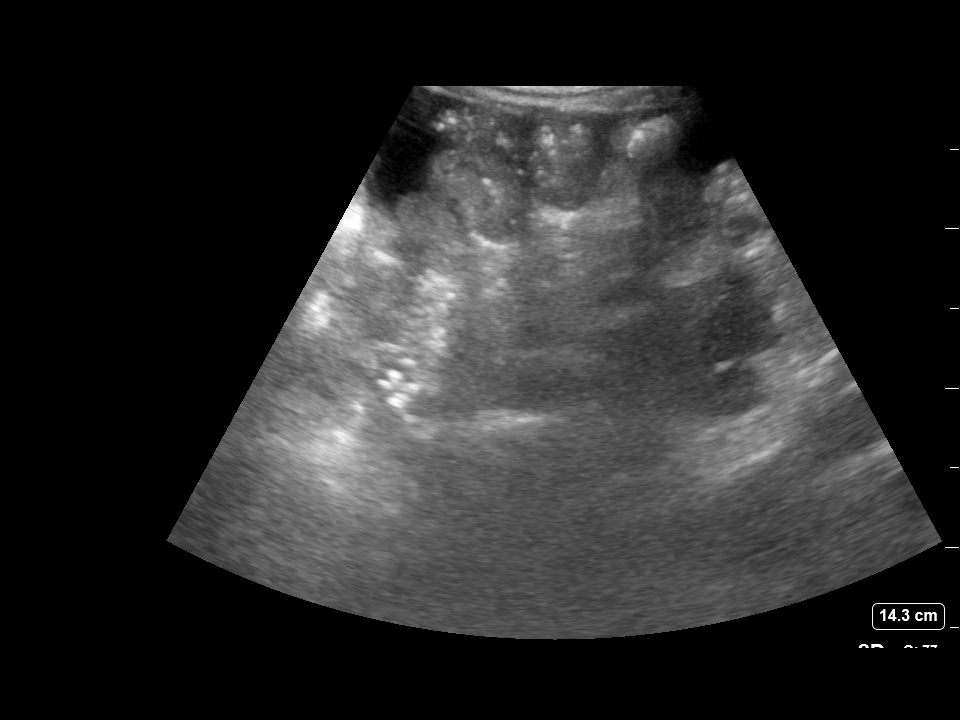
[im 2/2]
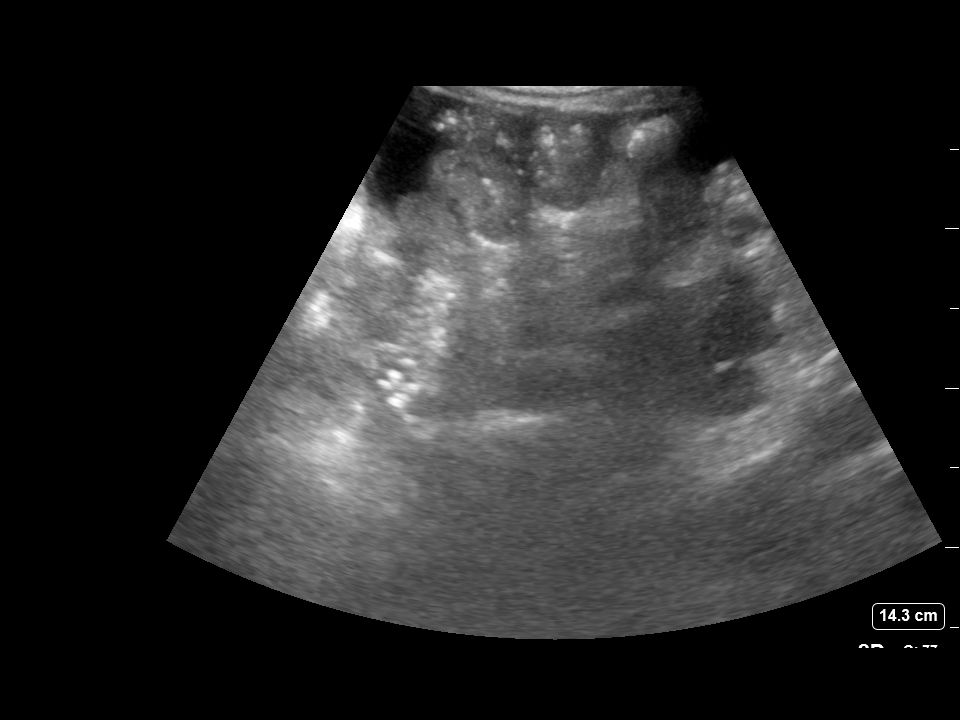

[2 of 2 positions shown; findings below may reference images not displayed]

FINDINGS: Trace abdominopelvic ascites
IMPRESSION: No significant ascites that warrants paracentesis. Procedure not
performed.

## 2020-08-23 MED ORDER — LIDOCAINE HCL 1 % IJ SOLN
INTRAMUSCULAR | Status: AC
  Filled 2020-08-23: qty 20

## 2020-08-23 MED ORDER — ALBUMIN HUMAN 25 % IV SOLN
50.0000 g | Freq: Once | INTRAVENOUS | Status: DC
Start: 2020-08-23 — End: 2020-08-23

## 2020-08-23 NOTE — Telephone Encounter (Signed)
Dr. Loletha Carrow, see BP reading below.

## 2020-08-23 NOTE — Telephone Encounter (Signed)
Spoke with [name], RN in regards to Dr. Loletha Carrow' recommendations below. She states that she will try to accompany the patient to his upcoming follow up appt, she verbalized understanding of all information and had no concerns at the end of the call.

## 2020-08-23 NOTE — Telephone Encounter (Signed)
-----   Message from [name], RN sent at 08/23/2020 11:03 AM EDT ----- Regarding: BP reading 08/22/20  104/60

## 2020-08-23 NOTE — Progress Notes (Signed)
Patient presented to IR for a therapeutic paracentesis. Limited ultrasound imaging revealed only a scant amount of fluid and nothing that could be safely accessed. This was explained to Mr. Fedor via interpreter. No procedure performed. Dr. Loletha Carrow and Dr. Annamaria Boots made aware. Images saved in Epic.  Soyla Dryer, [place] [phone] 08/23/2020, 10:07 AM

## 2020-08-23 NOTE — Telephone Encounter (Signed)
Thanks for the update.   It is clear that his BP runs low in this range.  Please thank Hoyle Sauer for that and we do not need to trouble her with sending [place] more BP readings for now.  I am also aware that the IR dept was unable to access any fluid for paracentesis today.  I will see him at the upcoming appointment.

## 2020-09-03 ENCOUNTER — Encounter: Payer: Self-pay | Admitting: Gastroenterology

## 2020-09-03 ENCOUNTER — Ambulatory Visit (INDEPENDENT_AMBULATORY_CARE_PROVIDER_SITE_OTHER): Payer: Medicare Other | Admitting: Gastroenterology

## 2020-09-03 ENCOUNTER — Other Ambulatory Visit (INDEPENDENT_AMBULATORY_CARE_PROVIDER_SITE_OTHER): Payer: Medicare Other

## 2020-09-03 VITALS — BP 84/50 | HR 92 | Ht 64.5 in | Wt 135.1 lb

## 2020-09-03 DIAGNOSIS — K7031 Alcoholic cirrhosis of liver with ascites: Secondary | ICD-10-CM | POA: Diagnosis not present

## 2020-09-03 DIAGNOSIS — I9589 Other hypotension: Secondary | ICD-10-CM | POA: Diagnosis not present

## 2020-09-03 DIAGNOSIS — R933 Abnormal findings on diagnostic imaging of other parts of digestive tract: Secondary | ICD-10-CM

## 2020-09-03 DIAGNOSIS — B182 Chronic viral hepatitis C: Secondary | ICD-10-CM

## 2020-09-03 DIAGNOSIS — R7989 Other specified abnormal findings of blood chemistry: Secondary | ICD-10-CM

## 2020-09-03 DIAGNOSIS — N5089 Other specified disorders of the male genital organs: Secondary | ICD-10-CM

## 2020-09-03 LAB — COMPREHENSIVE METABOLIC PANEL
ALT: 23 U/L (ref 0–53)
AST: 46 U/L — ABNORMAL HIGH (ref 0–37)
Albumin: 2.9 g/dL — ABNORMAL LOW (ref 3.5–5.2)
Alkaline Phosphatase: 256 U/L — ABNORMAL HIGH (ref 39–117)
BUN: 8 mg/dL (ref 6–23)
CO2: 32 mEq/L (ref 19–32)
Calcium: 8.9 mg/dL (ref 8.4–10.5)
Chloride: 101 mEq/L (ref 96–112)
Creatinine, Ser: 0.81 mg/dL (ref 0.40–1.50)
GFR: 91.08 mL/min (ref >60.00)
Glucose, Bld: 88 mg/dL (ref 70–99)
Potassium: 3.6 mEq/L (ref 3.5–5.1)
Sodium: 137 mEq/L (ref 135–145)
Total Bilirubin: 1.9 mg/dL — ABNORMAL HIGH (ref 0.2–1.2)
Total Protein: 7.8 g/dL (ref 6.0–8.3)

## 2020-09-03 LAB — CBC WITH DIFFERENTIAL/PLATELET
Basophils Absolute: 0.1 10*3/uL (ref 0.0–0.1)
Basophils Relative: 1.9 % (ref 0.0–3.0)
Eosinophils Absolute: 0.6 10*3/uL (ref 0.0–0.7)
Eosinophils Relative: 7.8 % — ABNORMAL HIGH (ref 0.0–5.0)
HCT: 35.3 % — ABNORMAL LOW (ref 39.0–52.0)
Hemoglobin: 11.9 g/dL — ABNORMAL LOW (ref 13.0–17.0)
Lymphocytes Relative: 29.8 % (ref 12.0–46.0)
Lymphs Abs: 2.4 10*3/uL (ref 0.7–4.0)
MCHC: 33.8 g/dL (ref 30.0–36.0)
MCV: 94.1 fl (ref 78.0–100.0)
Monocytes Absolute: 0.9 10*3/uL (ref 0.1–1.0)
Monocytes Relative: 11.8 % (ref 3.0–12.0)
Neutro Abs: 3.8 10*3/uL (ref 1.4–7.7)
Neutrophils Relative %: 48.7 % (ref 43.0–77.0)
Platelets: 161 10*3/uL (ref 150.0–400.0)
RBC: 3.75 Mil/uL — ABNORMAL LOW (ref 4.22–5.81)
RDW: 16.1 % — ABNORMAL HIGH (ref 11.5–15.5)
WBC: 7.9 10*3/uL (ref 4.0–10.5)

## 2020-09-03 LAB — GAMMA GT: GGT: 547 U/L — ABNORMAL HIGH (ref 7–51)

## 2020-09-03 LAB — PROTIME-INR
INR: 1.2 ratio — ABNORMAL HIGH (ref 0.8–1.0)
Prothrombin Time: 13.1 s (ref 9.6–13.1)

## 2020-09-03 NOTE — Progress Notes (Signed)
[facility] Progress Note  Chief Complaint: End-stage alcohol-related cirrhosis with ascites  Subjective  History: This is a 69 year old 38 man known to me for cirrhosis diagnosed during hospitalization late 2020, large volume ascites and severe scrotal edema.  He underwent TIPS in July of this year with a good initial response, but then return of ascites and scrotal edema.  Poor health literacy, language barrier, limited social support.  At last visit it was clear that he had not understood the need for sodium restriction.  He has on low-dose diuretics with relative hypotension. He remains on prophylactic lactulose since the TIPS, has not had clinical signs of encephalopathy before or after. Most recent vascular ultrasound/TIPS study by IR in September showed patent TIPS.  Starting in early October he was noted to have markedly elevated alkaline phosphatase between about 400 and 460 with a stable mild elevation of bilirubin 2-2.5.  Of note, the alkaline phosphatase was normal pre-TIPS.  LV ejection fraction, valvular function and right-sided heart pressures normal pre-TIPS. Because of that, schedule CT abdomen and pelvis, which he ultimately underwent with the assistance and logistical support of his congregational nurse.  (That nurse has also been periodically checking his blood pressure and sending [place] reports on that, and was able to get in regular follow-up in the [facility] medical resident clinic). CT scan report as below, radiologist noted at least 2 lesions that they felt were suspicious for [facility] in the setting of cirrhosis. AFP remains normal on last check 07/20/2020 Lastly, has chronic HCV, untreated, last Virla load early Oct 293K (was 208K in Dec 2020) Has ID appt ([place]) 09/11/20   After the CT report, I communicated with Dr. Vernard Gambles of IR, the physician who placed the TIPS.  He reviewed the CT images, and felt that the small lesions were not likely amenable to biopsy given  their size and location.  Sent patient for therapeutic paracentesis on 08/23/2020, but the report was of insufficient fluid to tap.  He is here today with his congregational nurse, but the in person [place] interpreter did not show and could not be reached.  We communicated via interpreter that works with his Scientist, research (physical sciences) and was on speaker phone.  Daric continues to have problems with marked scrotal edema.  He did get a truss support as I recommended and helps with the discomfort but the degree of edema is the same as before.  Abdominal distention is gone down.  It sounds like he might be trying to adhere to a low-sodium diet.  He recently injured his leg and has some pain in his thigh.  Otherwise he had no new symptoms or problems since I last saw him.  ROS: Cardiovascular:  no chest pain Respiratory: no dyspnea Generalized fatigue  The patient's Past Medical, Family and Social History were reviewed and are on file in the EMR.  Objective:  Med list reviewed  Current Outpatient Medications:  .  furosemide (LASIX) 40 MG tablet, Take 1 tablet (40 mg total) by mouth daily., Disp: 90 tablet, Rfl: 3 .  lactulose (CHRONULAC) 10 GM/15ML solution, Take 30 mLs (20 g total) by mouth daily., Disp: 236 mL, Rfl: 3 .  spironolactone (ALDACTONE) 100 MG tablet, Take 1 tablet (100 mg total) by mouth daily., Disp: 90 tablet, Rfl: 3   Vital signs in last 24 hrs: Vitals:   09/03/20 1507  BP: (!) 84/50  Pulse: 92    Physical Exam  His congregational nurse [name] was present for  the entire visit.  HEENT: sclera anicteric, oral mucosa moist without lesions  Neck: supple, no thyromegaly, JVD or lymphadenopathy  Cardiac: RRR without murmurs, S1S2 heard, no peripheral edema  Pulm: clear to auscultation bilaterally, normal RR and effort noted  Abdomen: soft, no tenderness, with active bowel sounds.  Marked left hepatic lobe enlargement  Skin; warm and dry, no jaundice or  rash Marked scrotal edema Labs:   ___________________________________________ Radiologic studies: CLINICAL DATA:  Cirrhosis, metastatic BC, right-sided abdominal pain   EXAM: CT ABDOMEN WITH CONTRAST   TECHNIQUE: Multidetector CT imaging of the abdomen was performed using the standard protocol following bolus administration of intravenous contrast.   CONTRAST:  137mL OMNIPAQUE IOHEXOL 300 MG/ML  SOLN   COMPARISON:  03/02/2020   FINDINGS: Lower chest: No acute abnormality.   Hepatobiliary: Cirrhotic morphology of the liver. TIPS of the right hepatic vein. There is an arterially hyperenhancing lesion of the medial left lobe of the liver, hepatic segment IV, measuring 1.2 x 1.0 cm, significantly enlarged compared to prior examination dated 03/02/2020, at which time it measured approximately 0.7 x 0.6 cm (series 2, image 21). This lesion further demonstrates washout (series 12, image 12). There is a new arterially hyperenhancing lesion of the central liver dome, hepatic segment VIII, measuring 1.1 x 1.0 cm (series 2, image 19). There is some suggestion of subtle washout (series 12, image 11). Very subtle foci of punctuate arterial hyperenhancement of the medial anterior liver dome, hepatic segment VIII, are unchanged, measuring no greater than 4 mm (series 2, image 21). Mildly distended gallbladder containing gallstones dependently, appearance unchanged compared to prior examination. No biliary ductal dilatation. No gallbladder wall thickening, or biliary dilatation.   Pancreas: Unremarkable. No pancreatic ductal dilatation or surrounding inflammatory changes.   Spleen: Normal in size without focal abnormality.   Adrenals/Urinary Tract: Adrenal glands are unremarkable. Kidneys are normal, without renal calculi, focal lesion, or hydronephrosis. Bladder is unremarkable.   Stomach/Bowel: Stomach is within normal limits. Appendix appears normal. No evidence of bowel wall  thickening, distention, or inflammatory changes.   Vascular/Lymphatic: Aortic atherosclerosis. No enlarged abdominal lymph nodes.   Other: No abdominal wall hernia or abnormality. Moderate volume ascites throughout the abdomen and pelvis.   Musculoskeletal: No acute or significant osseous findings.   IMPRESSION: 1. Cirrhotic morphology of the liver with TIPS of the right hepatic vein. The portal and hepatic veins as well as the TIPS appear patent. 2. No acute findings of the abdomen to explain pain. 3. There is an arterially hyperenhancing lesion of the medial left lobe of the liver, hepatic segment IV, measuring 1.2 x 1.0 cm, significantly enlarged compared to prior examination dated 03/02/2020. This lesion further demonstrates washout. LI-RADS category 5, presumed hepatocellular carcinoma. 4. There is a new arterially hyperenhancing lesion of the central liver dome, hepatic segment VIII, measuring 1.1 x 1.0 cm. There is some suggestion of subtle washout. LI-RADS category 5, presumed hepatocellular carcinoma. 5. Very subtle foci of punctuate arterial hyperenhancement of the medial anterior liver dome, hepatic segment VIII, are unchanged, measuring no greater than 4 mm. LI-RADS category 3, intermediate probability for hepatocellular carcinoma. Attention on follow-up. 6. Moderate volume ascites throughout the abdomen and pelvis. 7. Cholelithiasis without evidence of cholecystitis. 8. Aortic Atherosclerosis (ICD10-I70.0).   These results will be called to the ordering clinician or representative by the Radiologist Assistant, and communication documented in the PACS or [employer].     Electronically Signed   By: Eddie Candle M.D.   On:  08/16/2020 15:58    ____________________________________________ Other:   _____________________________________________ Assessment & Plan  Assessment: Encounter Diagnoses  Name Primary?  . Alcoholic cirrhosis of liver with  ascites ([place]) Yes  . Chronic hepatitis C without hepatic coma ([place])   . Scrotal edema   . Other specified hypotension   . LFT elevation   . Abnormal finding on GI tract imaging    End-stage liver disease from alcohol-related cirrhosis.  Low viral load hepatitis C, has infectious disease consult next week to consider treatment. His ascites is better after TIPS placement and recent education about sodium restriction.  He can only tolerate low-dose diuretics due to his hypotension.  I am still puzzled by the marked alkaline phosphatase elevation that is new since labs done shortly before his TIPS in July.  TIPS appears to be functioning well, he has no evidence of extrahepatic biliary obstruction, no intrahepatic biliary dilatation, and these new liver lesions that would not appear to explain that. It would be unusual for him to have a presentation of concomitant PBC or PSC with alkaline phosphatase that was normal pre-TIPS.  He is on no medicines that would cause this, does not report any herbal or other such supplements/treatments, and this degree of alkaline phosphatase elevation would be much more than expected from a secondary cause such as bony disease.  The liver lesions are of uncertain significance.  Dr. Vernard Gambles feels that one of them was there and unchanged from a scan in May of this year.  Since they are not amenable to biopsy, we will have to rescan at about a 28-month interval and see if there is any change. Plan:  CBC,CMP, GGT, INR,ANA,ASMA,AMA today  OV 2 months (repeat CT w and w/o contrast then)   50 minutes were spent on this encounter (including chart review, history/exam, counseling/coordination of care, and documentation)  Nelida Meuse III

## 2020-09-03 NOTE — Patient Instructions (Addendum)
If you are age 67 or older, your body mass index should be between 23-30. Your Body mass index is 22.84 kg/m. If this is out of the aforementioned range listed, please consider follow up with your Primary Care Provider.  If you are age 72 or younger, your body mass index should be between 19-25. Your Body mass index is 22.84 kg/m. If this is out of the aformentioned range listed, please consider follow up with your Primary Care Provider.   Your provider has requested that you go to the basement level for lab work before leaving today. Press "B" on the elevator. The lab is located at the first door on the left as you exit the elevator.  Due to recent changes in healthcare laws, you may see the results of your imaging and laboratory studies on MyChart before your provider has had a chance to review them.  We understand that in some cases there may be results that are confusing or concerning to you. Not all laboratory results come back in the same time frame and the provider may be waiting for multiple results in order to interpret others.  Please give [place] 48 hours in order for your provider to thoroughly review all the results before contacting the office for clarification of your results.   It was a pleasure to see you today!  Dr. Loletha Carrow

## 2020-09-04 NOTE — Congregational Nurse Program (Signed)
Home visit to deliver Lactulose which was picked up from [facility] on [address].  [name] RN, Congregational Nurse [phone]

## 2020-09-05 NOTE — Congregational Nurse Program (Signed)
Home visit with interpreter [name].  Reminded patient he has an appointment on 09/11/2020 with Dr. Linus Salmons [facility] for his Hepatitis C.  Medicaid transportation has been requested.  BP check 90/56.  Patient taking medications as directed.  [name] RN, Congregational Nurse [phone].  [name] RN, Congregational Nurse [phone]

## 2020-09-06 LAB — MITOCHONDRIAL ANTIBODIES: Mitochondrial M2 Ab, IgG: 57.7 U — ABNORMAL HIGH

## 2020-09-06 LAB — ANTI-SMOOTH MUSCLE ANTIBODY, IGG: Actin (Smooth Muscle) Antibody (IGG): <20 U (ref <20)

## 2020-09-06 LAB — ANA: Anti Nuclear Antibody (ANA): NEGATIVE

## 2020-09-11 ENCOUNTER — Ambulatory Visit (INDEPENDENT_AMBULATORY_CARE_PROVIDER_SITE_OTHER): Payer: Medicare Other | Admitting: Internal Medicine

## 2020-09-11 ENCOUNTER — Other Ambulatory Visit: Payer: Self-pay

## 2020-09-11 ENCOUNTER — Encounter: Payer: Self-pay | Admitting: Internal Medicine

## 2020-09-11 VITALS — BP 100/62 | HR 76 | Temp 98.1°F | Wt 135.0 lb

## 2020-09-11 DIAGNOSIS — K7031 Alcoholic cirrhosis of liver with ascites: Secondary | ICD-10-CM

## 2020-09-11 DIAGNOSIS — R7989 Other specified abnormal findings of blood chemistry: Secondary | ICD-10-CM

## 2020-09-11 DIAGNOSIS — K7469 Other cirrhosis of liver: Secondary | ICD-10-CM | POA: Diagnosis not present

## 2020-09-11 DIAGNOSIS — B182 Chronic viral hepatitis C: Secondary | ICD-10-CM | POA: Diagnosis present

## 2020-09-11 NOTE — Assessment & Plan Note (Addendum)
Followed by GI and on diuretics.   CT scan noted and concerns for [facility] though the AFP is wnl. Will discuss with GI if biopsy indicated.

## 2020-09-11 NOTE — Assessment & Plan Note (Addendum)
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  We have attempted to get him into the [facility]he[facility]ology clinic but he has consistently missed his appointments despite arrangements of transportation.  At this point, will look at treating him in hopes of stabilizing his liver.  Will discuss further with GI.  Will get him the hepatitis A and B vaccine next visit.

## 2020-09-11 NOTE — Progress Notes (Signed)
   Subjective:    Patient ID: Donald Aguilar, male    DOB: [birth year], 69 y.o.   MRN: [id number]  HPI He is here for follow up of chronic hepatitis C. He has genotype 1a infection with recent viral load of 293,000.  I attempted to send him to hepatology for evaluation with his underlying decompensated liver disease but after multiple attempts to get him there, he did not go to the appointment with several no shows.  He otherwise complains of abdominal pain that is not new.     Review of Systems  Constitutional: Negative for unexpected weight change.  Gastrointestinal: Negative for diarrhea and nausea.  Skin: Negative for rash.       Objective:   Physical Exam Eyes:     General: No scleral icterus. Abdominal:     Palpations: Abdomen is soft.  Neurological:     General: No focal deficit present.     Mental Status: He is alert.  Psychiatric:        Mood and Affect: Mood normal.   SH: no alcohol       Assessment & Plan:

## 2020-09-12 ENCOUNTER — Other Ambulatory Visit: Payer: Self-pay | Admitting: Internal Medicine

## 2020-09-12 ENCOUNTER — Telehealth: Payer: Self-pay

## 2020-09-12 DIAGNOSIS — B182 Chronic viral hepatitis C: Secondary | ICD-10-CM

## 2020-09-12 MED ORDER — SOFOSBUVIR-VELPATASVIR 400-100 MG PO TABS: 1.0000 | ORAL_TABLET | Freq: Every day | ORAL | 5 refills | Status: DC

## 2020-09-12 NOTE — Telephone Encounter (Signed)
[facility] Patient Advocate Encounter   Received notification from [facility]  that prior authorization for Raeanne Gathers is required.   PA submitted on 09/12/20 Key [id number] Status is pending    [facility] will continue to follow.   [name], [facility] Patient [facility] Phone: [phone] Fax:  [phone]

## 2020-09-17 ENCOUNTER — Telehealth: Payer: Self-pay | Admitting: Pharmacist

## 2020-09-17 ENCOUNTER — Telehealth: Payer: Self-pay

## 2020-09-17 NOTE — Telephone Encounter (Signed)
[facility] Patient Advocate Encounter  Prior Authorization for [employer]  has been approved.    PA# [id number] Effective dates: 09/12/20 through 10/19/2021  Patients co-pay is $4.00.   [facility] will continue to follow.  [name], [facility] Patient [facility] Phone: [phone] Fax:  [phone]

## 2020-09-17 NOTE — Telephone Encounter (Signed)
Patient is approved to receive Epclusa x 24 weeks for chronic Hepatitis C infection. Counseled patient to take Epclusa daily with or without food. Encouraged patient not to miss any doses and explained how their chance of cure could go down with each dose missed. Counseled patient on what to do if dose is missed - if it is closer to the missed dose take immediately; if closer to next dose skip dose and take the next dose at the usual time.   Counseled patient on common side effects such as headache, fatigue, and nausea and that these normally decrease with time. I reviewed patient medications and found no drug interactions. Discussed with patient that there are several drug interactions including acid suppressants. Instructed patient to call clinic if he wishes to start a new medication during course of therapy. Also advised patient to call if he experiences any side effects.

## 2020-09-18 ENCOUNTER — Other Ambulatory Visit (HOSPITAL_COMMUNITY): Payer: Self-pay | Admitting: Internal Medicine

## 2020-09-18 MED FILL — EPCLUSA 400 MG-100 MG TAB: 400-100 | 28 days supply | Qty: 28 | Fill #0

## 2020-09-26 NOTE — Congregational Nurse Program (Signed)
Home visit with interpreter [name].  BP 102/58.  Patient was out of lactulose. Refill called in to [place] on [address].  Reminded him of appointment next week 12/15 with Dr. Linus Salmons.  Medicaid transportation requested.  [name] RN, Congregational Nurse [phone]

## 2020-10-03 ENCOUNTER — Other Ambulatory Visit: Payer: Self-pay

## 2020-10-03 ENCOUNTER — Ambulatory Visit (INDEPENDENT_AMBULATORY_CARE_PROVIDER_SITE_OTHER): Payer: Medicare Other | Admitting: Internal Medicine

## 2020-10-03 ENCOUNTER — Encounter: Payer: Self-pay | Admitting: Internal Medicine

## 2020-10-03 VITALS — BP 104/56 | HR 97 | Temp 97.4°F | Wt 138.4 lb

## 2020-10-03 DIAGNOSIS — Z5181 Encounter for therapeutic drug level monitoring: Secondary | ICD-10-CM | POA: Diagnosis not present

## 2020-10-03 DIAGNOSIS — K7469 Other cirrhosis of liver: Secondary | ICD-10-CM | POA: Diagnosis not present

## 2020-10-03 DIAGNOSIS — B182 Chronic viral hepatitis C: Secondary | ICD-10-CM | POA: Diagnosis present

## 2020-10-03 DIAGNOSIS — Z0000 Encounter for general adult medical examination without abnormal findings: Secondary | ICD-10-CM | POA: Insufficient documentation

## 2020-10-03 NOTE — Assessment & Plan Note (Signed)
Started therapy and doing well, no issues.  I will check a viral load even though it is very early.   rtc in 2 months and will repeat the viral load again.

## 2020-10-03 NOTE — Progress Notes (Signed)
   Subjective:    Patient ID: Donald Aguilar, male    DOB: 08-Sep-1951, 69 y.o.   MRN: [id number]  HPI Here for follow up of chronic hepatitis C He was started on Epclusa with plan for 24 weeks with his associated decompensated liver disease.  He is having no fatigue or headache with the medication.  He states his abdominal pain is better since starting.    Review of Systems  Constitutional: Negative for fatigue.  Skin: Negative for rash.  Neurological: Negative for headaches.       Objective:   Physical Exam Eyes:     General: No scleral icterus. Pulmonary:     Effort: Pulmonary effort is normal.  Neurological:     Mental Status: He is alert.  Psychiatric:        Mood and Affect: Mood normal.   SH: no alcohol        Assessment & Plan:

## 2020-10-03 NOTE — Assessment & Plan Note (Signed)
I discussed with him that treatment of hepatitis C will not resolve his cirrhosis but hopefully with have some benefit at stabilizing his advanced liver disease.

## 2020-10-03 NOTE — Assessment & Plan Note (Signed)
I will check his LFTs on the medication.

## 2020-10-06 LAB — COMPLETE METABOLIC PANEL WITH GFR
AG Ratio: 0.8 (calc) — ABNORMAL LOW (ref 1.0–2.5)
ALT: 17 U/L (ref 9–46)
AST: 30 U/L (ref 10–35)
Albumin: 3.1 g/dL — ABNORMAL LOW (ref 3.6–5.1)
Alkaline phosphatase (APISO): 103 U/L (ref 35–144)
BUN: 11 mg/dL (ref 7–25)
CO2: 28 mmol/L (ref 20–32)
Calcium: 8.5 mg/dL — ABNORMAL LOW (ref 8.6–10.3)
Chloride: 103 mmol/L (ref 98–110)
Creat: 0.9 mg/dL (ref 0.70–1.25)
GFR, Est African American: 102 mL/min/{1.73_m2} (ref >=60)
GFR, Est Non African American: 88 mL/min/{1.73_m2} (ref >=60)
Globulin: 3.9 g/dL (calc) — ABNORMAL HIGH (ref 1.9–3.7)
Glucose, Bld: 113 mg/dL — ABNORMAL HIGH (ref 65–99)
Potassium: 3.1 mmol/L — ABNORMAL LOW (ref 3.5–5.3)
Sodium: 138 mmol/L (ref 135–146)
Total Bilirubin: 1.6 mg/dL — ABNORMAL HIGH (ref 0.2–1.2)
Total Protein: 7 g/dL (ref 6.1–8.1)

## 2020-10-06 LAB — HEPATITIS C RNA QUANTITATIVE
HCV Quantitative Log: <1.18 log IU/mL
HCV RNA, PCR, QN: <15 IU/mL

## 2020-10-15 MED FILL — EPCLUSA 400 MG-100 MG TAB: 400-100 | 28 days supply | Qty: 28 | Fill #1

## 2020-10-24 ENCOUNTER — Other Ambulatory Visit: Payer: Self-pay

## 2020-10-24 ENCOUNTER — Ambulatory Visit (INDEPENDENT_AMBULATORY_CARE_PROVIDER_SITE_OTHER): Payer: Medicare Other | Admitting: Pharmacist

## 2020-10-24 DIAGNOSIS — B182 Chronic viral hepatitis C: Secondary | ICD-10-CM

## 2020-10-24 NOTE — Progress Notes (Signed)
HPI: Donald Aguilar is a 70 y.o. male who presents to the [facility] pharmacy clinic for Hepatitis C follow-up.  Medication: Epclusa x 24 weeks  Start Date: 09/20/20  Hepatitis C Genotype: 1a  Fibrosis Score: F4 decompensated cirrhosis  Hepatitis C RNA: 293,000 on 07/20/20; <15 on 10/03/20  Patient Active Problem List   Diagnosis Date Noted  . Medication monitoring encounter 10/03/2020  . Chronic hepatitis C without hepatic coma (HCC) 09/28/2019  . Erosive gastritis   . Cirrhosis of liver (HCC) 09/09/2019  . Ascites 09/09/2019    Patient's Medications  New Prescriptions   No medications on file  Previous Medications   FUROSEMIDE (LASIX) 40 MG TABLET    Take 1 tablet (40 mg total) by mouth daily.   LACTULOSE (CHRONULAC) 10 GM/15ML SOLUTION    Take 30 mLs (20 g total) by mouth daily.   SOFOSBUVIR-VELPATASVIR (EPCLUSA) 400-100 MG TABS    Take 1 tablet by mouth daily.   SPIRONOLACTONE (ALDACTONE) 100 MG TABLET    Take 1 tablet (100 mg total) by mouth daily.  Modified Medications   No medications on file  Discontinued Medications   No medications on file    Allergies: No Known Allergies  Past Medical History: Past Medical History:  Diagnosis Date  . 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 GERD (gastroesophageal reflux disease)   . Hepatitis C    Genotype 1a  . Hypotension     Social History: Social History   Socioeconomic History  . Marital status: Married    Spouse name: Not on file  . Number of children: Not on file  . Years of education: Not on file  . Highest education level: Not on file  Occupational History  . Not on file  Tobacco Use  . Smoking status: Current Every Day Smoker    Packs/day: 0.30    Years: 50.00    Pack years: 15.00    Types: Cigarettes  . Smokeless tobacco: Never Used  . Tobacco comment: approximately a couple of packs a week  Substance and Sexual Activity  . Alcohol use: Not Currently    Comment: last drank in September,  2020  . Drug use: Never  . Sexual activity: Not on file  Other Topics Concern  . Not on file  Social History Narrative   Leave with 3 friends   Smoke 2 cigarette  a day   Social Determinants of Health   Financial Resource Strain: Not on file  Food Insecurity: Not on file  Transportation Needs: Not on file  Physical Activity: Not on file  Stress: Not on file  Social Connections: Not on file    Labs: Hepatitis C Lab Results  Component Value Date   HCVGENOTYPE 1a 09/12/2019   HCVRNAPCRQN <15 10/03/2020   HCVRNAPCRQN 293,000 (H) 07/20/2020   HCVRNAPCRQN 208,000 (H) 09/28/2019   Hepatitis B Lab Results  Component Value Date   HEPBSAB NON REACTIVE 09/14/2019   HEPBSAG NON REACTIVE 09/09/2019   HEPBCAB Reactive (A) 09/13/2019   Hepatitis A No results found for: HAV HIV Lab Results  Component Value Date   HIV NON REACTIVE 09/09/2019   Lab Results  Component Value Date   CREATININE 0.90 10/03/2020   CREATININE 0.81 09/03/2020   CREATININE 0.71 (L) 08/09/2020   CREATININE 0.88 07/30/2020   CREATININE 0.76 07/20/2020   Lab Results  Component Value Date   AST 30 10/03/2020   AST 46 (H) 09/03/2020   AST 67 (H) 08/09/2020  ALT 17 10/03/2020   ALT 23 09/03/2020   ALT 30 08/09/2020   INR 1.2 (H) 09/03/2020   INR 1.2 05/11/2020   INR 1.2 04/24/2020    Assessment: Donald Aguilar is here today to follow up for his chronic Hepatitis C infection. He is prescribed 24 weeks of Epclusa. He saw Dr. Luciana Axe on 12/15 and already had an undetectable viral load 2 weeks after starting treatment. He comes in today with some issues that are unrelated to his Hepatitis C (hard to fall asleep, legs tingling, etc). He is having hard stools as well. I asked him to increase his fiber and water intake.   He takes the [place] every day at 7am and hasn't missed a single dose. He has received his 2nd month from the pharmacy. Reminded him of his appointment with GI on Monday 1/10 and his appointment  with Dr. Luciana Axe on 3/1.   Plan: - Continue Epclusa x 24 weeks - F/u with Dr. Luciana Axe in March  Reeya Bound L. Pati Thinnes, PharmD, BCIDP, AAHIVP, CPP Clinical Pharmacist Practitioner Infectious Diseases Clinical Pharmacist [facility] 10/24/2020, 11:28 AM

## 2020-10-29 ENCOUNTER — Ambulatory Visit (INDEPENDENT_AMBULATORY_CARE_PROVIDER_SITE_OTHER): Payer: Medicare Other | Admitting: Gastroenterology

## 2020-10-29 ENCOUNTER — Other Ambulatory Visit (INDEPENDENT_AMBULATORY_CARE_PROVIDER_SITE_OTHER): Payer: Medicare Other

## 2020-10-29 ENCOUNTER — Encounter: Payer: Self-pay | Admitting: Gastroenterology

## 2020-10-29 VITALS — BP 100/60 | HR 86 | Ht 64.5 in | Wt 139.0 lb

## 2020-10-29 DIAGNOSIS — R933 Abnormal findings on diagnostic imaging of other parts of digestive tract: Secondary | ICD-10-CM

## 2020-10-29 DIAGNOSIS — R7989 Other specified abnormal findings of blood chemistry: Secondary | ICD-10-CM | POA: Diagnosis not present

## 2020-10-29 DIAGNOSIS — N5089 Other specified disorders of the male genital organs: Secondary | ICD-10-CM

## 2020-10-29 DIAGNOSIS — B182 Chronic viral hepatitis C: Secondary | ICD-10-CM

## 2020-10-29 DIAGNOSIS — K7031 Alcoholic cirrhosis of liver with ascites: Secondary | ICD-10-CM

## 2020-10-29 LAB — COMPREHENSIVE METABOLIC PANEL
ALT: 18 U/L (ref 0–53)
AST: 29 U/L (ref 0–37)
Albumin: 3.5 g/dL (ref 3.5–5.2)
Alkaline Phosphatase: 76 U/L (ref 39–117)
BUN: 12 mg/dL (ref 6–23)
CO2: 30 mEq/L (ref 19–32)
Calcium: 9.3 mg/dL (ref 8.4–10.5)
Chloride: 102 mEq/L (ref 96–112)
Creatinine, Ser: 1.02 mg/dL (ref 0.40–1.50)
GFR: 75.84 mL/min (ref >60.00)
Glucose, Bld: 103 mg/dL — ABNORMAL HIGH (ref 70–99)
Potassium: 3.2 mEq/L — ABNORMAL LOW (ref 3.5–5.1)
Sodium: 136 mEq/L (ref 135–145)
Total Bilirubin: 1.2 mg/dL (ref 0.2–1.2)
Total Protein: 7.7 g/dL (ref 6.0–8.3)

## 2020-10-29 NOTE — Addendum Note (Signed)
Addended by: Elias Else on: 10/29/2020 04:13 PM   Modules accepted: Orders

## 2020-10-29 NOTE — Patient Instructions (Signed)
If you are age 69 or older, your body mass index should be between 23-30. Your Body mass index is 23.49 kg/m. If this is out of the aforementioned range listed, please consider follow up with your Primary Care Provider.  If you are age 57 or younger, your body mass index should be between 19-25. Your Body mass index is 23.49 kg/m. If this is out of the aformentioned range listed, please consider follow up with your Primary Care Provider.   Your provider has requested that you go to the basement level for lab work before leaving today. Press "B" on the elevator. The lab is located at the first door on the left as you exit the elevator.  Due to recent changes in healthcare laws, you may see the results of your imaging and laboratory studies on MyChart before your provider has had a chance to review them.  We understand that in some cases there may be results that are confusing or concerning to you. Not all laboratory results come back in the same time frame and the provider may be waiting for multiple results in order to interpret others.  Please give [place] 48 hours in order for your provider to thoroughly review all the results before contacting the office for clarification of your results.   You have been scheduled for a CT scan of the abdomen and pelvis at [facility], 1st floor Radiology. You are scheduled on 11-05-2020  at 1pm. You should arrive 15 minutes prior to your appointment time for registration.  Please pick up 2 bottles of contrast from [facility] at least 3 days prior to your scan. The solution may taste better if refrigerated, but do NOT add ice or any other liquid to this solution. Shake well before drinking.   Please follow the written instructions below on the day of your exam:   1) Do not eat anything after 9am (4 hours prior to your test)   2) Drink 1 bottle of contrast @ 11am (2 hours prior to your exam)  Remember to shake well before drinking and do NOT pour over ice.      Drink 1 bottle of contrast @ 12/noon (1 hour prior to your exam)   You may take any medications as prescribed with a small amount of water, if necessary. If you take any of the following medications: METFORMIN, GLUCOPHAGE, GLUCOVANCE, AVANDAMET, RIOMET, FORTAMET, [place] MET, JANUMET, GLUMETZA or METAGLIP, you MAY be asked to HOLD this medication 48 hours AFTER the exam.   The purpose of you drinking the oral contrast is to aid in the visualization of your intestinal tract. The contrast solution may cause some diarrhea. Depending on your individual set of symptoms, you may also receive an intravenous injection of x-ray contrast/dye. Plan on being at [facility] for 45 minutes or longer, depending on the type of exam you are having performed.   If you have any questions regarding your exam or if you need to reschedule, you may call [facility] at [phone] between the hours of 8:00 am and 5:00 pm, Monday-Friday.   You have been scheduled for an abdominal ultrasound at [facility] (1st floor of hospital) on 11-05-2020 at 130pm. Please arrive 15 minutes prior to your appointment for registration. Make certain not to have anything to eat or drink 6 hours prior to your appointment. Should you need to reschedule your appointment, please contact radiology at [phone]. This test typically takes about 30 minutes to perform.  It was a pleasure to see you today!  Dr. Loletha Carrow

## 2020-10-29 NOTE — Progress Notes (Signed)
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  Subjective  History: This is her first clinic follow-up since 09/03/2020.  Prior notes with detailed history of patient's presentation with decompensated alcohol-related cirrhosis in late 2020.  He has been abstinent from alcohol since Aguilar, Donald Aguilar that. Ascites Donald very difficult to control (and patient has concomitant severe scrotal edema), patient underwent TIPS summer 2021, with significant initial improvement, Aguilar recurrence of volume overload requiring paracentesis.  This seemed largely due to poor health literacy and therefore noncompliance with sodium restriction.  After that, alkaline phosphatase went up significantly, where it had previously been normal.  He had a modest rise in bilirubin peaking at 1.9.  Labs showed a markedly elevated antimitochondrial antibody, and CT scan of the liver reported to small lesions suspicious Aguilar [facility] (though last AFP normal at 5.8 on 07/20/2020) Provider to provider consultation with interventional radiology (Dr. Vernard Gambles) indicated lesions would be very difficult to access percutaneously.  My plan Donald to repeat CT scan at about a 13-month interval, but I wanted the patient to have a transjugular liver biopsy Aguilar the possibility of PBC.  This Donald communicated to nursing and ultimately to radiology as well as the patient's Congregational nurse that helps manage his care.  At this point, he has not had that biopsy done. ______________________________________  This patient Donald seen with the aid of an interpreter today.  He describes some abdominal rumbling and intermittent constipation.  He still has uncomfortable scrotal edema Aguilar which he wears a truss.  He denies rectal bleeding, hematemesis, nausea vomiting or dysphagia. He reports  taking all of his medications daily.  ROS: Cardiovascular:  no chest pain Respiratory: no dyspnea Remainder of systems negative as near as can be determined, somewhat difficult with language barrier. The patient's Past Medical, Family and Social History were reviewed and are on file in the EMR.  Objective:  Med list reviewed  Current Outpatient Medications:  .  furosemide (LASIX) 40 MG tablet, Take 1 tablet (40 mg total) by mouth daily., Disp: 90 tablet, Rfl: 3 .  lactulose (CHRONULAC) 10 GM/15ML solution, Take 30 mLs (20 g total) by mouth daily., Disp: 236 mL, Rfl: 3 .  Sofosbuvir-Velpatasvir (EPCLUSA) 400-100 MG TABS, Take 1 tablet by mouth daily., Disp: 28 tablet, Rfl: 5 .  spironolactone (ALDACTONE) 100 MG tablet, Take 1 tablet (100 mg total) by mouth daily., Disp: 90 tablet, Rfl: 3   Vital signs in last 24 hrs: Vitals:   10/29/20 1510  BP: 100/60  Pulse: 86  SpO2: 93%   Wt Readings from Last 3 Encounters:  10/29/20 139 lb (63 kg)  10/03/20 138 lb 6.4 oz (62.8 kg)  09/11/20 135 lb (61.2 kg)    Physical Exam  Chronically ill-appearing, temporal wasting as before.  Steady gait, gets on exam table without difficulty.  HEENT: sclera anicteric, oral mucosa moist without lesions  Neck: supple, no thyromegaly, JVD or lymphadenopathy  Cardiac: RRR without murmurs, S1S2 heard, no peripheral edema  Pulm: clear to auscultation bilaterally, normal RR and effort noted  Abdomen: soft, no tenderness, with active bowel sounds.  Left lobe liver enlarged, no spleen tip palpable  Mild bulging flanks, no distention or tenderness.  Skin; warm and dry, no jaundice or rash Marked scrotal enlargement as before Labs:   AMA = 57  CMP Latest  Ref Rng & Units 10/03/2020 09/03/2020 08/09/2020  Glucose 65 - 99 mg/dL 113(H) 88 93  BUN 7 - 25 mg/dL 11 8 11   Creatinine 0.70 - 1.25 mg/dL 0.90 0.81 0.71(L)  Sodium 135 - 146 mmol/L 138 137 136  Potassium 3.5 - 5.3 mmol/L 3.1(L) 3.6 3.6   Chloride 98 - 110 mmol/L 103 101 101  CO2 20 - 32 mmol/L 28 32 23  Calcium 8.6 - 10.3 mg/dL 8.5(L) 8.9 8.3(L)  Total Protein 6.1 - 8.1 g/dL 7.0 7.8 7.4  Total Bilirubin 0.2 - 1.2 mg/dL 1.6(H) 1.9(H) 1.4(H)  Alkaline Phos 39 - 117 U/L - 256(H) 432(H)  AST 10 - 35 U/L 30 46(H) 67(H)  ALT 9 - 46 U/L 17 23 30    CBC Latest Ref Rng & Units 09/03/2020 05/11/2020 05/01/2020  WBC 4.0 - 10.5 K/uL 7.9 5.9 7.5  Hemoglobin 13.0 - 17.0 g/dL 11.9(L) 12.4(L) 12.9(L)  Hematocrit 39.0 - 52.0 % 35.3(L) 39.3 39.2  Platelets 150.0 - 400.0 K/uL 161.0 193 231   Lab Results  Component Value Date   INR 1.2 (H) 09/03/2020   INR 1.2 05/11/2020   INR 1.2 04/24/2020   Hepatitis C PCR undetected on October 03, 2020 (less than 3 months into therapy)  ___________________________________________ Radiologic studies:  CLINICAL DATA:  Cirrhosis, metastatic BC, right-sided abdominal pain   EXAM: CT ABDOMEN WITH CONTRAST   TECHNIQUE: Multidetector CT imaging of the abdomen Donald performed using the standard protocol following bolus administration of intravenous contrast.   CONTRAST:  122mL OMNIPAQUE IOHEXOL 300 MG/ML  SOLN   COMPARISON:  03/02/2020   FINDINGS: Lower chest: No acute abnormality.   Hepatobiliary: Cirrhotic morphology of the liver. TIPS of the right hepatic vein. There is an arterially hyperenhancing lesion of the medial left lobe of the liver, hepatic segment IV, measuring 1.2 x 1.0 cm, significantly enlarged compared to prior examination dated 03/02/2020, at which time it measured approximately 0.7 x 0.6 cm (series 2, image 21). This lesion further demonstrates washout (series 12, image 12). There is a new arterially hyperenhancing lesion of the central liver dome, hepatic segment VIII, measuring 1.1 x 1.0 cm (series 2, image 19). There is some suggestion of subtle washout (series 12, image 11). Very subtle foci of punctuate arterial hyperenhancement of the medial anterior liver dome,  hepatic segment VIII, are unchanged, measuring no greater than 4 mm (series 2, image 21). Mildly distended gallbladder containing gallstones dependently, appearance unchanged compared to prior examination. No biliary ductal dilatation. No gallbladder wall thickening, or biliary dilatation.   Pancreas: Unremarkable. No pancreatic ductal dilatation or surrounding inflammatory changes.   Spleen: Normal in size without focal abnormality.   Adrenals/Urinary Tract: Adrenal glands are unremarkable. Kidneys are normal, without renal calculi, focal lesion, or hydronephrosis. Bladder is unremarkable.   Stomach/Bowel: Stomach is within normal limits. Appendix appears normal. No evidence of bowel wall thickening, distention, or inflammatory changes.   Vascular/Lymphatic: Aortic atherosclerosis. No enlarged abdominal lymph nodes.   Other: No abdominal wall hernia or abnormality. Moderate volume ascites throughout the abdomen and pelvis.   Musculoskeletal: No acute or significant osseous findings.   IMPRESSION: 1. Cirrhotic morphology of the liver with TIPS of the right hepatic vein. The portal and hepatic veins as well as the TIPS appear patent. 2. No acute findings of the abdomen to explain pain. 3. There is an arterially hyperenhancing lesion of the medial left lobe of the liver, hepatic segment IV, measuring 1.2 x 1.0 cm, significantly enlarged compared to prior  examination dated 03/02/2020. This lesion further demonstrates washout. LI-RADS category 5, presumed hepatocellular carcinoma. 4. There is a new arterially hyperenhancing lesion of the central liver dome, hepatic segment VIII, measuring 1.1 x 1.0 cm. There is some suggestion of subtle washout. LI-RADS category 5, presumed hepatocellular carcinoma. 5. Very subtle foci of punctuate arterial hyperenhancement of the medial anterior liver dome, hepatic segment VIII, are unchanged, measuring no greater than 4 mm. LI-RADS category  3, intermediate probability Aguilar hepatocellular carcinoma. Attention on follow-up. 6. Moderate volume ascites throughout the abdomen and pelvis. 7. Cholelithiasis without evidence of cholecystitis. 8. Aortic Atherosclerosis (ICD10-I70.0).   These results will be called to the ordering clinician or representative by the Radiologist Assistant, and communication documented in the PACS or [employer].     Electronically Signed   By: Eddie Candle M.D.   On: 08/16/2020 15:58  (Images were personally reviewed soon after the report came out and during discussions with interventional radiology) ____________________________________________ Other:   _____________________________________________ Assessment & Plan  Assessment: Encounter Diagnoses  Name Primary?  . Alcoholic cirrhosis of liver with ascites ([place]) Yes  . LFT elevation   . Chronic hepatitis C without hepatic coma ([place])   . Scrotal edema   . Abnormal finding on GI tract imaging    Multifactorial cirrhosis, former alcohol abuse and hepatitis C that is currently being treated with Epclusa.  He reports adherence to treatment regimen.  On low-dose lactulose to prevent recurrence of in-hospital hepatic encephalopathy.  His ascites is now under much better control, hopefully he is more adherent to low-sodium diet.  Status post TIPS as noted above.  He still has marked scrotal enlargement, which may be fluid but not I am concerned it may be a large hernia since the abdominal fluid is under good control.  The nature of liver lesions is uncertain, they were small and in location is not amenable to biopsy when found about 3 months ago. He also might have primary biliary cholangitis, though it is strange how the alkaline phosphatase went from normal to markedly elevated after the TIPS placement.  His AMA Donald quite elevated. Plan: CMP, AFP, antimitochondrial antibody.  If alkaline phosphatase and AMA still significantly elevated, I  believe he will need the liver biopsy.  Whether he needs focused liver biopsy Aguilar any enlarging and suspicious lesions will be determined after a repeat scan. Therefore, we will hold off on plans Aguilar transjugular liver biopsy until we know the status of the suspicious liver lesions.  CT abdomen and pelvis with oral contrast but also with and without IV contrast.  Scrotal ultrasound to assess Aguilar fluid versus large inguinal hernia   45 minutes were spent on this encounter (including chart review, history/exam, counseling/coordination of care, and documentation) > 50% of that time Donald spent on counseling and coordination of care.  Topics discussed included: Management of fluid (no medication changes today), importance of adherence to low-sodium diet and hepatitis C treatment.  I explained through the interpreter the fact that liver lesions of uncertain significance were found and they need follow-up.  He also understands the possibility of other conditions that may need liver biopsy.  Nelida Meuse III

## 2020-10-30 ENCOUNTER — Other Ambulatory Visit: Payer: Self-pay

## 2020-10-30 LAB — MITOCHONDRIAL ANTIBODIES: Mitochondrial M2 Ab, IgG: 30.9 U — ABNORMAL HIGH

## 2020-10-30 LAB — AFP TUMOR MARKER: AFP-Tumor Marker: 6.6 ng/mL — ABNORMAL HIGH (ref <6.1)

## 2020-10-30 MED ORDER — POTASSIUM CHLORIDE CRYS ER 20 MEQ PO TBCR: 40.0000 meq | EXTENDED_RELEASE_TABLET | Freq: Every day | ORAL | 2 refills | Status: DC

## 2020-10-31 NOTE — Congregational Nurse Program (Signed)
Home visit with interpreter [name].  Reminded patient of appointment for C-T scan and ultrasound on 11/05/2020 at [facility]. We reviewed times and instructions for contrast and to be NPO after 9:00 am that morning except for drinking contrast.  He will be picked up by Medicaid transportation around 12 noon.  We also told him that Dr. Corena Pilgrim office called and said that the lab work done on 10/29/2020 showed that his potassium is low.  He has sent a prescription for potassium to [place] on [address]. He should take one tablet each day.  Continues to take other medicines as prescribed.  [name] RN, Congregational Nurse [phone]

## 2020-11-05 ENCOUNTER — Ambulatory Visit (HOSPITAL_COMMUNITY): Payer: Medicare Other

## 2020-11-07 NOTE — Congregational Nurse Program (Signed)
Home visit to take patient written information regarding CT scan and ultrasound which were rescheduled for 11/13/2020.  Will schedule Medicaid transportation and remind patient of instructions for appointment with assistance of interpreter [name].  [name] RN, Congregational Nurse [phone]

## 2020-11-13 ENCOUNTER — Emergency Department (HOSPITAL_COMMUNITY)
Admission: EM | Admit: 2020-11-13 | Discharge: 2020-11-13 | Disposition: A | Discharge status: Home / Self Care | Payer: Medicare Other | Attending: Emergency Medicine | Admitting: Emergency Medicine

## 2020-11-13 ENCOUNTER — Ambulatory Visit (HOSPITAL_COMMUNITY)
Admission: RE | Admit: 2020-11-13 | Discharge: 2020-11-13 | Disposition: A | Discharge status: Home / Self Care | Payer: Medicare Other | Source: Ambulatory Visit | Attending: Gastroenterology | Admitting: Gastroenterology

## 2020-11-13 ENCOUNTER — Encounter (HOSPITAL_COMMUNITY): Payer: Self-pay | Admitting: Student

## 2020-11-13 ENCOUNTER — Telehealth: Payer: Self-pay

## 2020-11-13 ENCOUNTER — Encounter (HOSPITAL_COMMUNITY): Payer: Self-pay

## 2020-11-13 ENCOUNTER — Other Ambulatory Visit: Payer: Self-pay

## 2020-11-13 ENCOUNTER — Emergency Department (HOSPITAL_COMMUNITY)
Admission: RE | Admit: 2020-11-13 | Discharge: 2020-11-13 | Disposition: A | Discharge status: Home / Self Care | Payer: Medicare Other | Source: Ambulatory Visit | Attending: Gastroenterology | Admitting: Gastroenterology

## 2020-11-13 DIAGNOSIS — K4090 Unilateral inguinal hernia, without obstruction or gangrene, not specified as recurrent: Secondary | ICD-10-CM | POA: Diagnosis not present

## 2020-11-13 DIAGNOSIS — K7031 Alcoholic cirrhosis of liver with ascites: Secondary | ICD-10-CM

## 2020-11-13 DIAGNOSIS — R933 Abnormal findings on diagnostic imaging of other parts of digestive tract: Secondary | ICD-10-CM

## 2020-11-13 DIAGNOSIS — R7989 Other specified abnormal findings of blood chemistry: Secondary | ICD-10-CM | POA: Insufficient documentation

## 2020-11-13 DIAGNOSIS — F17210 Nicotine dependence, cigarettes, uncomplicated: Secondary | ICD-10-CM | POA: Diagnosis not present

## 2020-11-13 DIAGNOSIS — N5089 Other specified disorders of the male genital organs: Secondary | ICD-10-CM

## 2020-11-13 DIAGNOSIS — R93812 Abnormal radiologic findings on diagnostic imaging of left testicle: Secondary | ICD-10-CM

## 2020-11-13 DIAGNOSIS — B182 Chronic viral hepatitis C: Secondary | ICD-10-CM

## 2020-11-13 DIAGNOSIS — N50812 Left testicular pain: Secondary | ICD-10-CM | POA: Diagnosis present

## 2020-11-13 IMAGING — US US SCROTUM W/ DOPPLER COMPLETE
1 series · 13 of 25 positions shown · non-contrast
Comparison: [DATE]

CLINICAL DATA: Possible hernia

EXAM:
SCROTAL ULTRASOUND
DOPPLER ULTRASOUND OF THE TESTICLES
TECHNIQUE: Complete ultrasound examination of the testicles, epididymis, and
other scrotal structures was performed. Color and spectral Doppler
ultrasound were also utilized to evaluate blood flow to the
testicles.

[Series 1: us scrotum w/ doppler complete · 35 acquisitions, 13 frames shown]
[im 1/35]
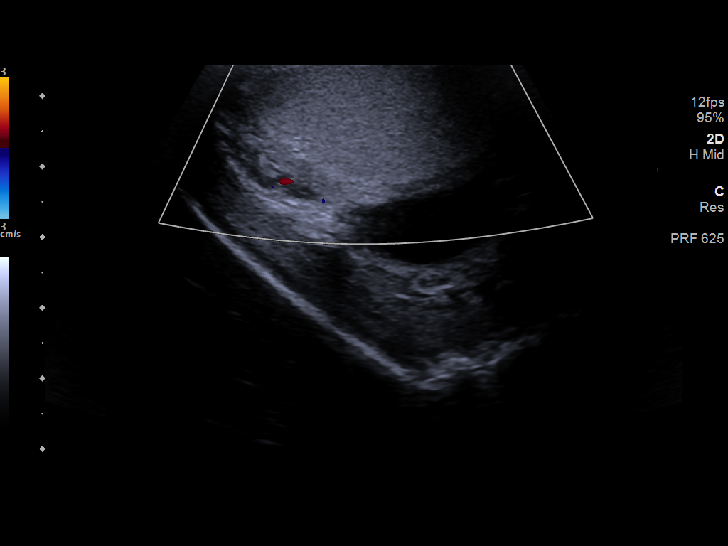
[im 3/35]
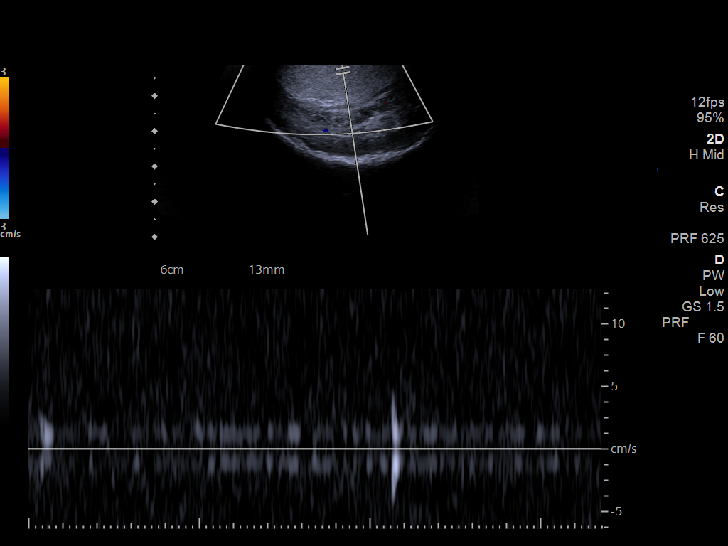
[im 6/35]
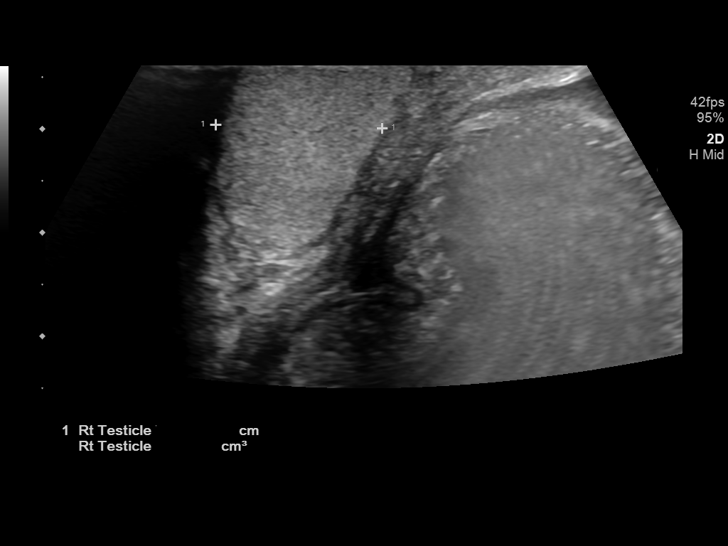
[im 9/35]
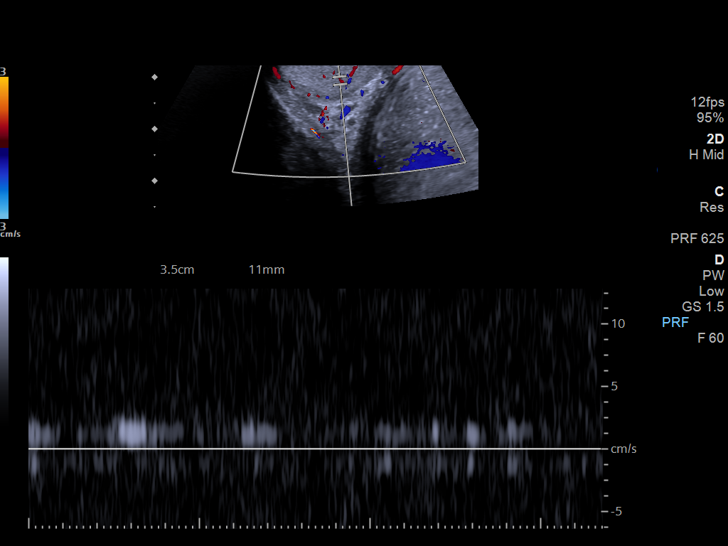
[im 12/35]
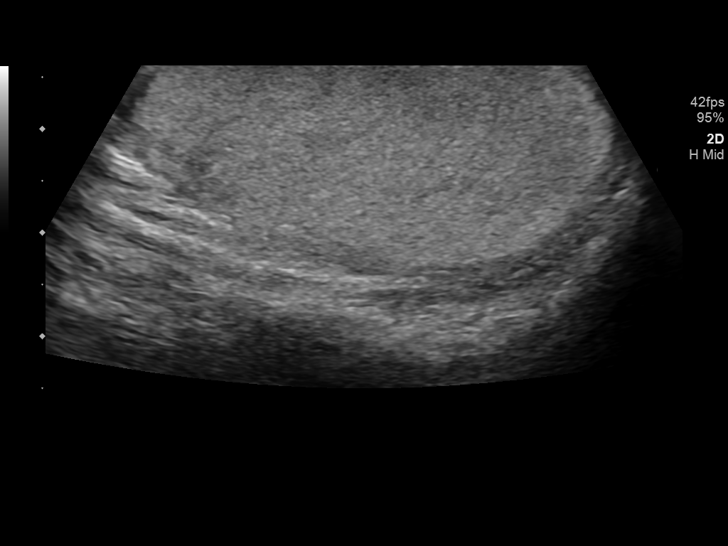
[im 15/35]
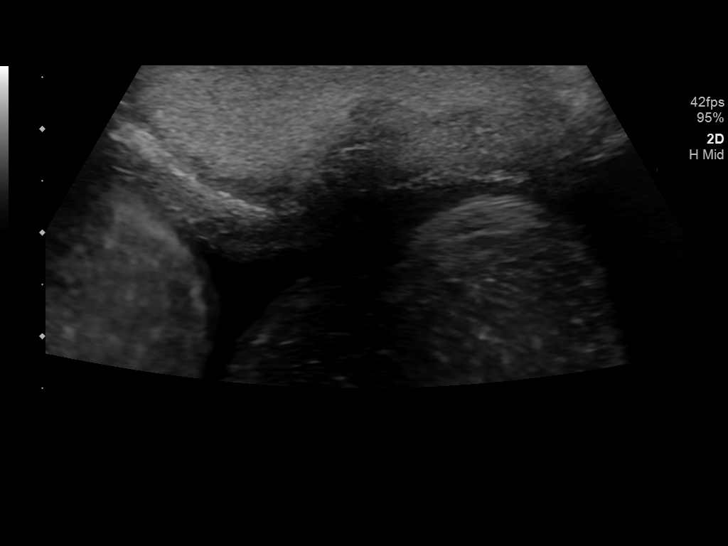
[im 18/35]
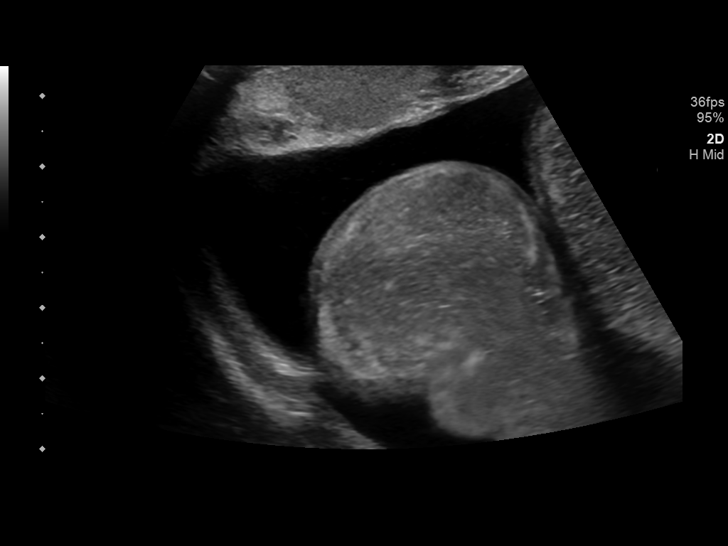
[im 20/35]
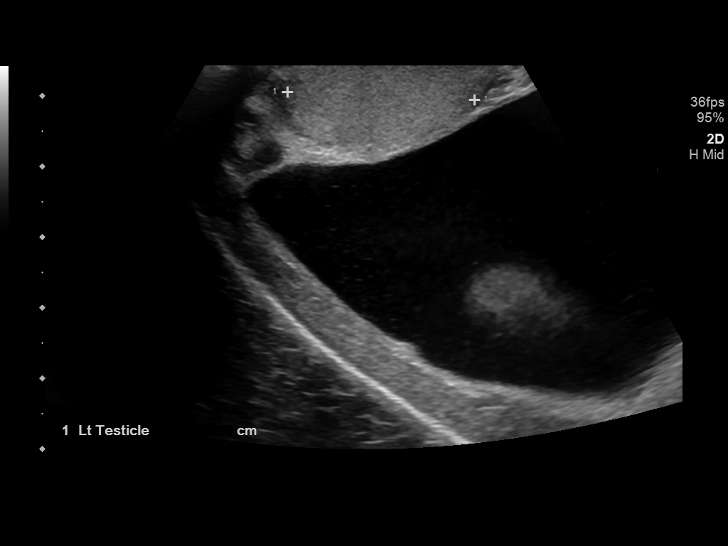
[im 23/35]
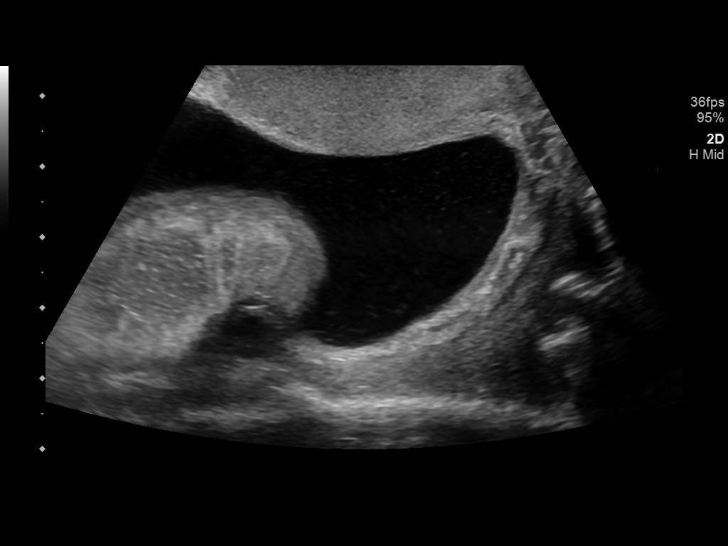
[im 26/35]
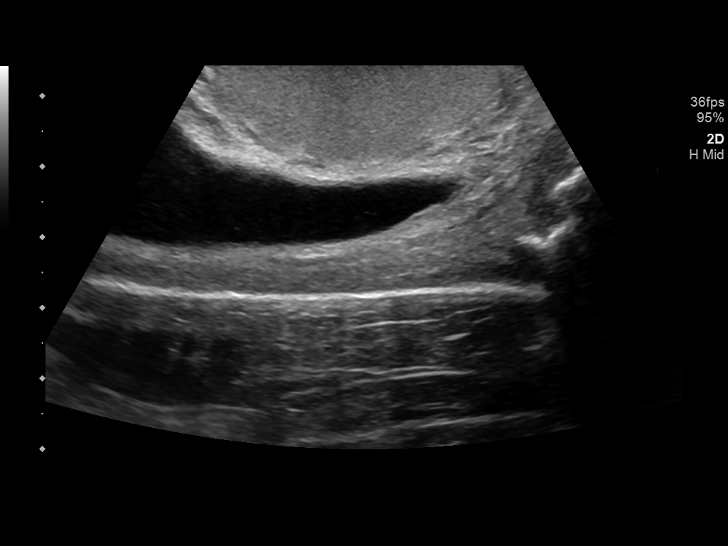
[im 29/35]
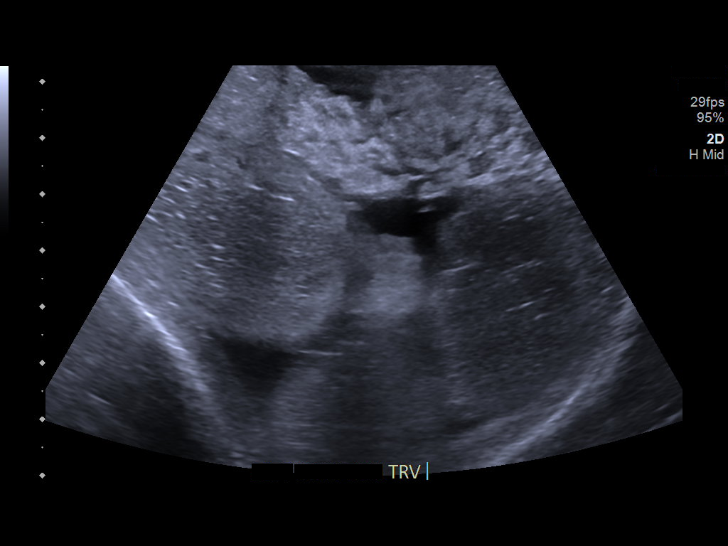
[im 32/35]
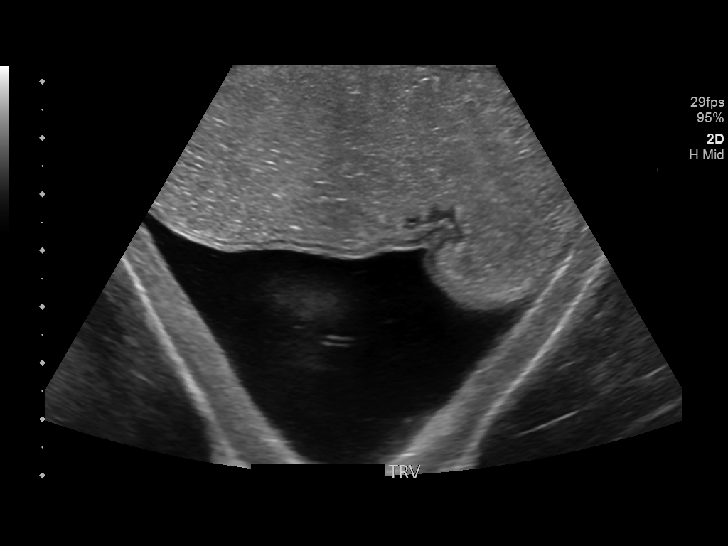
[im 35/35]
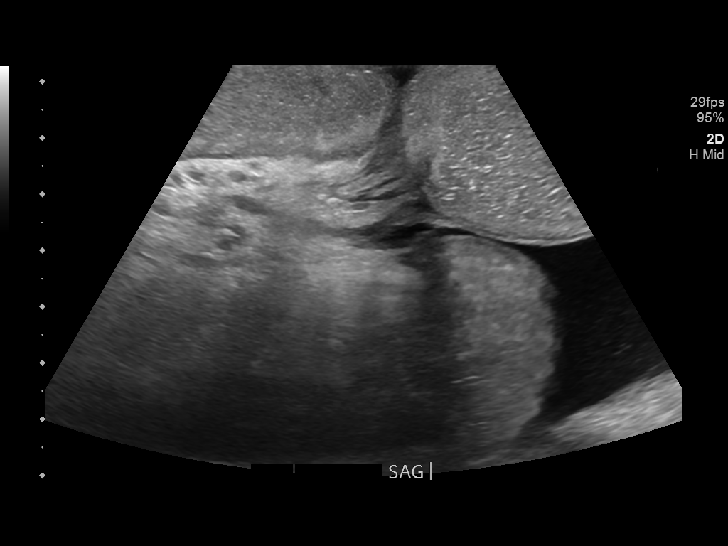

[13 of 25 positions shown; findings below may reference images not displayed]

FINDINGS: Right testicle

Measurements: 4.7 x 2.2 x 1.6 cm. No mass or microlithiasis
visualized.

Left testicle

Measurements: 4.0 x 1.3 x 2.7 cm. No mass or microlithiasis
visualized.

Right epididymis:  Normal in size and appearance.

Left epididymis:  Normal in size and appearance.

Hydrocele:  None visualized.

Varicocele:  None visualized.

Pulsed Doppler interrogation of both testes demonstrates normal low
resistance arterial and venous waveforms in the right testicle. No
vascularity is noted in the left testicle. This is of uncertain
chronicity.

Large left-sided inguinal hernia is noted containing bowel. This may
contribute to the lack of flow in the left testicle.
IMPRESSION: Large left inguinal hernia containing bowel.

No vascularity within the left testicle consistent with torsion.
This may be a chronic finding however clinical correlation is
recommended.

These results will be called to the ordering clinician or
representative by the Radiologist Assistant, and communication
documented in the PACS or [REDACTED].

## 2020-11-13 NOTE — ED Triage Notes (Signed)
Dr Gloriann Loan has been notified of patient

## 2020-11-13 NOTE — Telephone Encounter (Signed)
Good job - Research officer, [employer].  There is exactly what I would do RG

## 2020-11-13 NOTE — ED Notes (Addendum)
Dr. Gloriann Loan and Dr. Tomi Bamberger at bedside. [facility] interrupter at bedside translating.

## 2020-11-13 NOTE — ED Triage Notes (Signed)
Patient brought over from [place] for a testicle torsion.  Patient speaks montagnard rhade.

## 2020-11-13 NOTE — Discharge Instructions (Addendum)
Follow up with a general surgeon.  Call to schedule an appointment.  You were seen by Dr Gloriann Loan urology.  The testicle blood flow is absent but this has likely been like that for a long time.  No urgent treatment is needed for that although if you have surgery for the hernia they would likely remove the testicle at the same time.

## 2020-11-13 NOTE — Telephone Encounter (Addendum)
Dr. Lyndel Safe as DOD on 11/13/2020, Dr. Loletha Aguilar patient was seen on 10/29/2020 - complaints of scrotal edema  Received call report from [facility] radiology in regards to Scrotal ultrasound to assess for fluid versus large inguinal hernia. Radiology states that patient does have a hernia and no blood flow to the left testicle, they are unsure on how long this has been like this. Report was sent to be read as STAT should have report available soon. Please advise, thank you.

## 2020-11-13 NOTE — Consult Note (Signed)
H&P Physician requesting consult: Dorie Rank  Chief Complaint: Lack of flow to the left testicle  History of Present Illness: 70 year old male was getting a routine scrotal ultrasound today for concern of possible left-sided inguinal hernia.  The patient states he has been wearing scrotal support for over 3 months.  He has had a slight increase in pain for the past 2 weeks especially with lifting.  He denies any change in bowel movements.  He continues to have flatus.  He is not really experiencing much pain today.  He was sent to the emergency department after being found incidentally to have no flow to the left testicle.  He states he is having no pain in the area of his testicle.  There was no acute change.  No recent testicular pain.  Past Medical History:  Diagnosis Date  . 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 GERD (gastroesophageal reflux disease)   . Hepatitis C    Genotype 1a  . Hypotension    Past Surgical History:  Procedure Laterality Date  . BIOPSY  09/13/2019   Procedure: BIOPSY;  Surgeon: Ladene Artist, MD;  Location: [facility] ENDOSCOPY;  Service: Endoscopy;;  . ESOPHAGOGASTRODUODENOSCOPY (EGD) WITH PROPOFOL N/A 09/13/2019   Procedure: ESOPHAGOGASTRODUODENOSCOPY (EGD) WITH PROPOFOL;  Surgeon: Ladene Artist, MD;  Location: [facility] ENDOSCOPY;  Service: Endoscopy;  Laterality: N/A;  . IR PARACENTESIS  09/12/2019  . IR PARACENTESIS  09/29/2019  . IR PARACENTESIS  10/19/2019  . IR PARACENTESIS  11/02/2019  . IR PARACENTESIS  11/10/2019  . IR PARACENTESIS  11/22/2019  . IR PARACENTESIS  12/06/2019  . IR PARACENTESIS  12/16/2019  . IR PARACENTESIS  12/23/2019  . IR PARACENTESIS  12/30/2019  . IR PARACENTESIS  01/06/2020  . IR PARACENTESIS  01/16/2020  . IR PARACENTESIS  02/01/2020  . IR PARACENTESIS  02/08/2020  . IR PARACENTESIS  02/28/2020  . IR PARACENTESIS  03/23/2020  . IR PARACENTESIS  04/17/2020  . IR PARACENTESIS  05/02/2020  . IR PARACENTESIS  05/11/2020  . IR  PARACENTESIS  08/23/2020  . IR RADIOLOGIST EVAL & MGMT  03/20/2020  . IR RADIOLOGIST EVAL & MGMT  06/26/2020  . IR TIPS  05/11/2020  . RADIOLOGY WITH ANESTHESIA N/A 05/11/2020   Procedure: IR WITH ANESTHESIA TRASJUGULAR INTRAHEPATIC PORTOSYSTEMIC SHUNT;  Surgeon: Arne Cleveland, MD;  Location: [place];  Service: Radiology;  Laterality: N/A;    Home Medications:  (Not in a hospital admission)  Allergies: No Known Allergies  Family History  Problem Relation Age of Onset  . Liver cancer Neg Hx   . Colon cancer Neg Hx   . Pancreatic cancer Neg Hx   . Esophageal cancer Neg Hx    Social History:  reports that he has been smoking cigarettes. He has a 15.00 pack-year smoking history. He has never used smokeless tobacco. He reports previous alcohol use. He reports that he does not use drugs.  ROS: A complete review of systems was performed.  All systems are negative except for pertinent findings as noted. ROS   Physical Exam:  Vital signs in last 24 hours: Pulse Rate:  [81] 81 (01/25 1557) Resp:  [16] 16 (01/25 1557) BP: (113)/(72) 113/72 (01/25 1557) SpO2:  [99 %] 99 % (01/25 1557) Weight:  [35 kg] 63 kg (01/25 1552) General:  Alert and oriented, No acute distress HEENT: Normocephalic, atraumatic Neck: No JVD or lymphadenopathy Cardiovascular: Regular rate and rhythm Lungs: Regular rate and effort Abdomen: Soft, nontender, nondistended, no  abdominal masses Genitourinary: Normal phallus.  Right testicle descended without masses.  Palpable bowel within the left hemiscrotum.  I did not obviously palpate the left testicle. Back: No CVA tenderness Extremities: No edema Neurologic: Grossly intact  Laboratory Data:  No results found for this or any previous visit (from the past 24 hour(s)). No results found for this or any previous visit (from the past 240 hour(s)). Creatinine: No results for input(s): CREATININE in the last 168 hours.  Scrotal ultrasound personally reviewed and is  detailed in the history of present illness  Impression/Assessment:  Left inguinal hernia without obstruction or gangrene Avascularity of the left testicle  Plan:  I discussed with the patient that most likely this is not an acute process.  His presentation and examination findings are not consistent with acute testicular torsion.  It is more consistent with inguinal hernia and I think he likely has had no flow to the testicle that is not acute.  Right testicle is normal on examination and on ultrasound.  I recommended that he consult with a general surgeon.  I informed him that he would likely need an inguinal hernia repair with concurrent left-sided orchiectomy.  He expressed understanding.  I think it would be low yield to undergo an emergent scrotal exploration.  Marton Redwood, III 11/13/2020, 4:13 PM

## 2020-11-13 NOTE — Telephone Encounter (Addendum)
Dr. Lyndel Safe,   Jomarie Longs, RN reached out to [facility], they instructed the patient go to the ED and have them page Dr. Gloriann Loan who is on call for [facility] at [phone]  I called back to the radiology department and advised that they send patient to the ED and to page Dr. Gloriann Loan. ED Triage nurse is aware that patient will be coming.  Congregational nurse, Judie Petit' Brien is aware of the situation.

## 2020-11-13 NOTE — ED Provider Notes (Signed)
Donald Aguilar Note   CSN: [id number] Arrival date & time: 11/13/20  1538    Interpreter was used during evaluation.  History Chief Complaint  Patient presents with  . Testicle Pain  . Groin Swelling    Donald Aguilar is a 70 y.o. male.  HPI   Patient presented to the emergency room for evaluation of an abnormal ultrasound finding.  Patient had an outpatient ultrasound ordered by Dr. Loletha Aguilar.  The ultrasound showed the patient had a large left inguinal hernia but there was also absent testicular blood flow concerning for the possibility of torsion.  Radiologist did indicate chronic findings.  Dr. Gloriann Aguilar, neurology was consulted and patient was instructed to come to the ED.  Patient was seen by Dr. Maurie Aguilar while he was here.  Patient denies any trouble with vomiting or diarrhea.  He states he is not having any significant pain in his inguinal region.  He denies any trouble urinating.  He has been eating well for appetite is fine.  Past Medical History:  Diagnosis Date  . Cirrhosis (Donald Aguilar) 08/2019   with ascites, varices  . GERD (gastroesophageal reflux disease)   . Hepatitis C    Genotype 1a  . Hypotension     Patient Active Problem List   Diagnosis Date Noted  . Medication monitoring encounter 10/03/2020  . Chronic hepatitis C without hepatic coma (Donald Aguilar) 09/28/2019  . Erosive gastritis   . Cirrhosis of liver (Donald Aguilar) 09/09/2019  . Ascites 09/09/2019    Past Surgical History:  Procedure Laterality Date  . BIOPSY  09/13/2019   Procedure: BIOPSY;  Surgeon: Ladene Artist, MD;  Location: [facility] ENDOSCOPY;  Service: Endoscopy;;  . ESOPHAGOGASTRODUODENOSCOPY (EGD) WITH PROPOFOL N/A 09/13/2019   Procedure: ESOPHAGOGASTRODUODENOSCOPY (EGD) WITH PROPOFOL;  Surgeon: Ladene Artist, MD;  Location: [facility] ENDOSCOPY;  Service: Endoscopy;  Laterality: N/A;  . IR PARACENTESIS  09/12/2019  . IR PARACENTESIS  09/29/2019  . IR PARACENTESIS  10/19/2019  . IR  PARACENTESIS  11/02/2019  . IR PARACENTESIS  11/10/2019  . IR PARACENTESIS  11/22/2019  . IR PARACENTESIS  12/06/2019  . IR PARACENTESIS  12/16/2019  . IR PARACENTESIS  12/23/2019  . IR PARACENTESIS  12/30/2019  . IR PARACENTESIS  01/06/2020  . IR PARACENTESIS  01/16/2020  . IR PARACENTESIS  02/01/2020  . IR PARACENTESIS  02/08/2020  . IR PARACENTESIS  02/28/2020  . IR PARACENTESIS  03/23/2020  . IR PARACENTESIS  04/17/2020  . IR PARACENTESIS  05/02/2020  . IR PARACENTESIS  05/11/2020  . IR PARACENTESIS  08/23/2020  . IR RADIOLOGIST EVAL & MGMT  03/20/2020  . IR RADIOLOGIST EVAL & MGMT  06/26/2020  . IR TIPS  05/11/2020  . RADIOLOGY WITH ANESTHESIA N/A 05/11/2020   Procedure: IR WITH ANESTHESIA TRASJUGULAR INTRAHEPATIC PORTOSYSTEMIC SHUNT;  Surgeon: Donald Cleveland, MD;  Location: [place];  Service: Radiology;  Laterality: N/A;       Family History  Problem Relation Age of Onset  . Liver cancer Neg Hx   . Colon cancer Neg Hx   . Pancreatic cancer Neg Hx   . Esophageal cancer Neg Hx     Social History   Tobacco Use  . Smoking status: Current Every Day Smoker    Packs/day: 0.30    Years: 50.00    Pack years: 15.00    Types: Cigarettes  . Smokeless tobacco: Never Used  . Tobacco comment: approximately a couple of packs a week  Substance Use Topics  .  Alcohol use: Not Currently    Comment: last drank in September, 2020  . Drug use: Never    Home Medications Prior to Admission medications   Medication Sig Start Date End Date Taking? Authorizing Aguilar  furosemide (LASIX) 40 MG tablet Take 1 tablet (40 mg total) by mouth daily. 08/09/20   Donald Anis, MD  lactulose (CHRONULAC) 10 GM/15ML solution Take 30 mLs (20 g total) by mouth daily. 08/09/20   Donald Anis, MD  potassium chloride SA (KLOR-CON) 20 MEQ tablet Take 2 tablets (40 mEq total) by mouth daily. 10/30/20   Donald Stabler, MD  Sofosbuvir-Velpatasvir (EPCLUSA) 400-100 MG TABS Take 1 tablet by mouth daily. 09/12/20   Donald Headings, MD  spironolactone (ALDACTONE) 100 MG tablet Take 1 tablet (100 mg total) by mouth daily. 08/09/20   Donald Anis, MD    Allergies    Patient has no known allergies.  Review of Systems   Review of Systems  All other systems reviewed and are negative.   Physical Exam Updated Vital Signs BP 113/72   Pulse 81   Resp 16   Ht 1.638 m (5' 4.5")   Wt 63 kg   SpO2 99%   BMI 23.47 kg/m   Physical Exam Vitals and nursing note reviewed.  Constitutional:      General: He is not in acute distress.    Appearance: He is well-developed and well-nourished.  HENT:     Head: Normocephalic and atraumatic.     Right Ear: External ear normal.     Left Ear: External ear normal.  Eyes:     General: No scleral icterus.       Right eye: No discharge.        Left eye: No discharge.     Conjunctiva/sclera: Conjunctivae normal.  Neck:     Trachea: No tracheal deviation.  Cardiovascular:     Rate and Rhythm: Normal rate and regular rhythm.     Pulses: Intact distal pulses.  Pulmonary:     Effort: Pulmonary effort is normal. No respiratory distress.     Breath sounds: Normal breath sounds. No stridor. No wheezing or rales.  Abdominal:     General: Bowel sounds are normal. There is no distension.     Palpations: Abdomen is soft.     Tenderness: There is no abdominal tenderness. There is no guarding or rebound.  Genitourinary:    Comments: Left-sided inguinal hernia, Musculoskeletal:        General: No tenderness or edema.     Cervical back: Neck supple.  Skin:    General: Skin is warm and dry.     Findings: No rash.  Neurological:     Mental Status: He is alert.     Cranial Nerves: No cranial nerve deficit (no facial droop, extraocular movements intact, no slurred speech).     Sensory: No sensory deficit.     Motor: No abnormal muscle tone or seizure activity.     Coordination: Coordination normal.     Deep Tendon Reflexes: Strength normal.  Psychiatric:        Mood and  Affect: Mood and affect normal.     ED Results / Procedures / Treatments   Labs (all labs ordered are listed, but only abnormal results are displayed) Labs Reviewed - No data to display  EKG None  Radiology US SCROTUM W/DOPPLER  Result Date: 11/13/2020 CLINICAL DATA:  Possible hernia EXAM: SCROTAL ULTRASOUND DOPPLER ULTRASOUND OF THE  TESTICLES TECHNIQUE: Complete ultrasound examination of the testicles, epididymis, and other scrotal structures was performed. Color and spectral Doppler ultrasound were also utilized to evaluate blood flow to the testicles. COMPARISON:  08/16/2020 FINDINGS: Right testicle Measurements: 4.7 x 2.2 x 1.6 cm. No mass or microlithiasis visualized. Left testicle Measurements: 4.0 x 1.3 x 2.7 cm. No mass or microlithiasis visualized. Right epididymis:  Normal in size and appearance. Left epididymis:  Normal in size and appearance. Hydrocele:  None visualized. Varicocele:  None visualized. Pulsed Doppler interrogation of both testes demonstrates normal low resistance arterial and venous waveforms in the right testicle. No vascularity is noted in the left testicle. This is of uncertain chronicity. Large left-sided inguinal hernia is noted containing bowel. This may contribute to the lack of flow in the left testicle. IMPRESSION: Large left inguinal hernia containing bowel. No vascularity within the left testicle consistent with torsion. This may be a chronic finding however clinical correlation is recommended. These results will be called to the ordering clinician or representative by the Radiologist Assistant, and communication documented in the PACS or [employer]. Electronically Signed   By: Inez Catalina M.D.   On: 11/13/2020 15:09    Procedures Procedures   Medications Ordered in ED Medications - No data to display  ED Course  I have reviewed the triage vital signs and the nursing notes.  Pertinent labs & imaging results that were available during my care of  the patient were reviewed by me and considered in my medical decision making (see chart for details).    MDM Rules/Calculators/A&P                          Imaging tests reviewed.  Patient has pain left-sided inguinal hernia physical infection blood work left testicle.  The patient is having acute testicular torsion.  Patient was seen by Dr. Gloriann Aguilar urology he does not feel that any urgent intervention is required.  Patient is not having any trouble with abdominal pain.  No vomiting.  He denies any pain in the inguinal region.  I do not think he has any incarcerated strangulated inguinal hernia.  Will refer to general surgery.  Final Clinical Impression(s) / ED Diagnoses Final diagnoses:  Unilateral inguinal hernia without obstruction or gangrene, recurrence not specified  Abnormal finding on diagnostic imaging of left testicle    Rx / DC Orders ED Discharge Orders    None       Dorie Rank, MD 11/13/20 [phone]

## 2020-11-14 NOTE — Telephone Encounter (Signed)
Thank you for attending to this while I was out of the office the last 2 days.  ED note from yesterday indicates this patient was seen by urology, imaging was reviewed, and it was felt that he did not have urgent need for surgery. He was reportedly referred to general surgery.  Given the language barrier and difficulty for this patient navigating health care, please send an ASAP referral to [facility] surgery for symptomatic large inguinal hernia. First available physician appointment.  Per usual with this patient, communicating this with his congregational nurse would be helpful as well.  - HD

## 2020-11-14 NOTE — Telephone Encounter (Signed)
Urgent referral, records, demographics, and insurance information have been faxed to [facility]. Provided congregational nurse, Hoyle Sauer O'Brien's contact information to schedule appointment with her.   Spoke with Hoyle Sauer, she is aware that I have faxed referral this morning and that [facility] will be in contact with her soon to schedule an appt for the patient. Hoyle Sauer verbalized understanding and had no concerns at the end of the call.

## 2020-11-16 ENCOUNTER — Telehealth: Payer: Self-pay

## 2020-11-16 MED FILL — EPCLUSA 400 MG-100 MG TAB: 400-100 | 28 days supply | Qty: 28 | Fill #2

## 2020-11-16 NOTE — Telephone Encounter (Signed)
-----   Message from Yevette Edwards, RN sent at 11/15/2020  1:50 PM EST ----- Regarding: [facility] Referral Follow up on [facility] referral that was sent on 11/14/20 for symptomatic large inguinal hernia.

## 2020-11-16 NOTE — Telephone Encounter (Signed)
Called [facility] to follow up on referral, spoke with [place], she states that patient is not currently scheduled.   Left a voicemail for Lattie Haw to return call in regards to referral.

## 2020-11-19 NOTE — Telephone Encounter (Signed)
Called [facility] to follow up on referral, spoke with [facility] referral coordinator, she states that she did not receive the original referral asked that I refax referral, confirmed the fax number.   Re-faxed referral to [facility] today.

## 2020-11-20 ENCOUNTER — Telehealth: Payer: Self-pay

## 2020-11-20 NOTE — Telephone Encounter (Signed)
Phone call from [facility] to schedule appointment for inguinal hernia.  Patient will see Dr. Rush Farmer on Feb. 28, 2022 at 10:20 am.  They will mail packet to patient.  CN and interpreter will help him complete necessary paperwork prior to his appointment.  Medicaid transportation will be requested.  [name] RN, Congregational Nurse [phone]

## 2020-11-20 NOTE — Telephone Encounter (Signed)
Spoke with [place], patient is still not scheduled as of yet. She stated that the referral manager receives the referrals and distributes them after review which can take a few days. Requested that I be notified once patient has been scheduled.

## 2020-11-21 NOTE — Congregational Nurse Program (Signed)
Home visit with interpreter [name].  Advised patient of appointment at [facility] on 11/23/2020 at 4:15 pm for C-T scan.  Reviewed instructions. (NPO after 12:30 pm, drink #1 bottle contrast at 2:30 and #2 at 3:30.  Patient voiced understanding.  Medicaid transportation requested.  Patient out of Furosemide and had only a few Spironolactone left..  Called in refills to [facility].  Apparently not taking potassium as ordered--reiterated instructions to patient and he said he would it as ordered. Informed patient that he has an appointment with Dr. Rush Farmer at [facility] on 12/17/2020 at 10:20 am for evaluation of inguinal hernia.  CN will request Medicaid transportation for that appointment.  [name] RN, Congregational Nurse [phone].

## 2020-11-22 NOTE — Telephone Encounter (Signed)
Per Carolyn's note in epic, patient is scheduled to see Dr. Rush Farmer on 12/17/20 at 10:20 AM.

## 2020-11-23 ENCOUNTER — Ambulatory Visit (HOSPITAL_COMMUNITY)
Admission: RE | Admit: 2020-11-23 | Discharge: 2020-11-23 | Disposition: A | Discharge status: Home / Self Care | Payer: Medicare Other | Source: Ambulatory Visit | Attending: Gastroenterology | Admitting: Gastroenterology

## 2020-11-23 ENCOUNTER — Encounter (HOSPITAL_COMMUNITY): Payer: Self-pay

## 2020-11-23 ENCOUNTER — Other Ambulatory Visit: Payer: Self-pay

## 2020-11-23 DIAGNOSIS — B182 Chronic viral hepatitis C: Secondary | ICD-10-CM | POA: Diagnosis present

## 2020-11-23 DIAGNOSIS — R7989 Other specified abnormal findings of blood chemistry: Secondary | ICD-10-CM | POA: Diagnosis present

## 2020-11-23 DIAGNOSIS — R933 Abnormal findings on diagnostic imaging of other parts of digestive tract: Secondary | ICD-10-CM | POA: Insufficient documentation

## 2020-11-23 DIAGNOSIS — N5089 Other specified disorders of the male genital organs: Secondary | ICD-10-CM | POA: Diagnosis present

## 2020-11-23 DIAGNOSIS — K7031 Alcoholic cirrhosis of liver with ascites: Secondary | ICD-10-CM | POA: Insufficient documentation

## 2020-11-23 IMAGING — CT CT ABDOMEN W/ CM
3 of 5 series · 15 of 46 positions shown, 16 images · IV contrast (omnipaque)
Comparison: [DATE]

CLINICAL DATA: Liver mass follow-up. RIGHT upper quadrant pain for
1 year. Cirrhosis post tips in a 69-year-old male for follow-up of
liver masses.

EXAM:
CT ABDOMEN WITH CONTRAST
TECHNIQUE: Multidetector CT imaging of the abdomen was performed using the
standard protocol following bolus administration of intravenous
contrast.
CONTRAST:  100mL OMNIPAQUE IOHEXOL 300 MG/ML  SOLN

[Series 2: axial st · axial · 0.81mm/px · z∈[-387,-297]mm · 4 of 47 slices shown]
[im 6/47  soft-tissue]
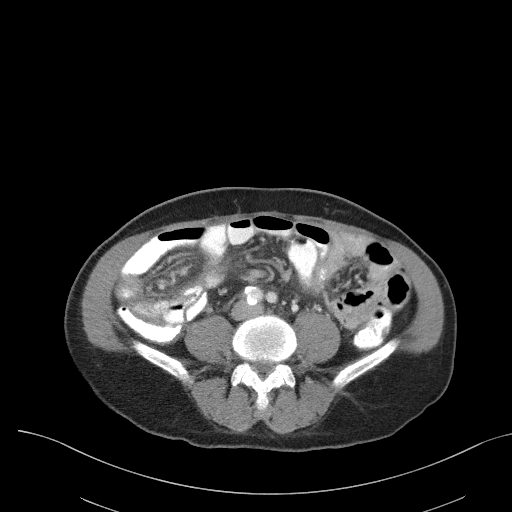
[im 12/47  soft-tissue]
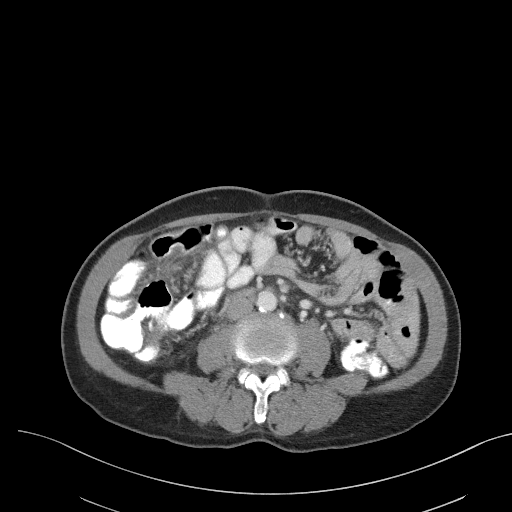
[im 18/47  soft-tissue]
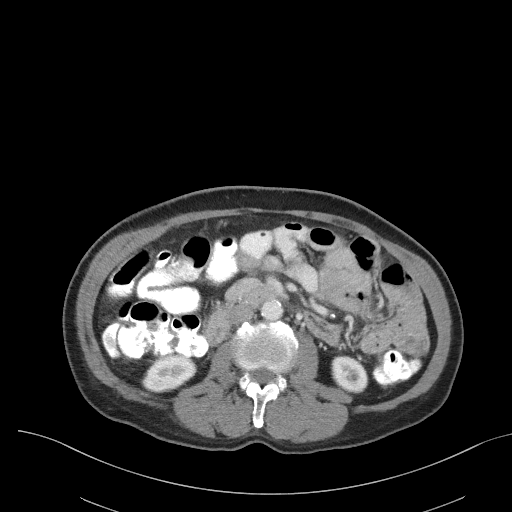
[im 24/47  soft-tissue]
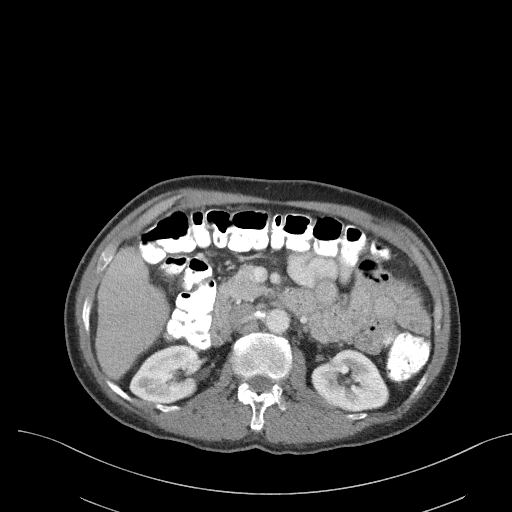

[Series 5: coronal st · coronal · 0.46mm/px · 3 of 85 slices shown]
[im 29/85  soft-tissue]
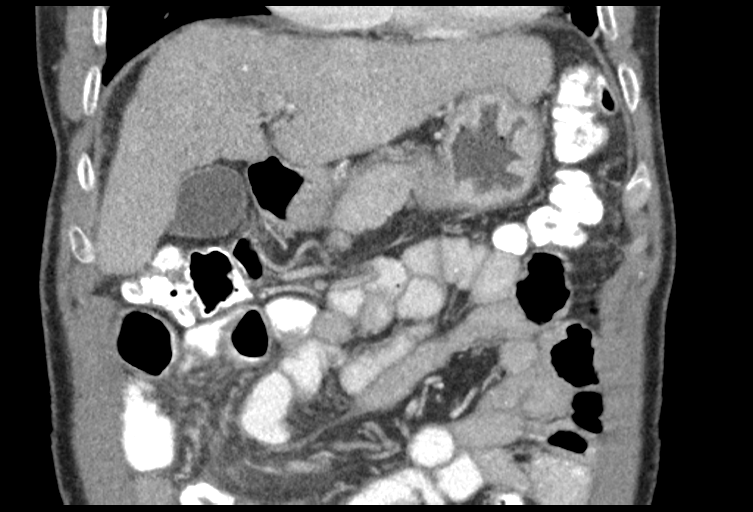
[im 38/85  soft-tissue]
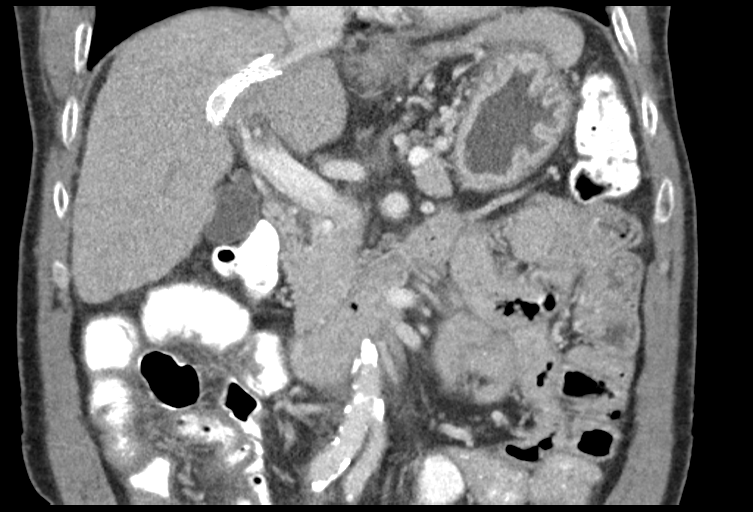
[im 47/85  soft-tissue]
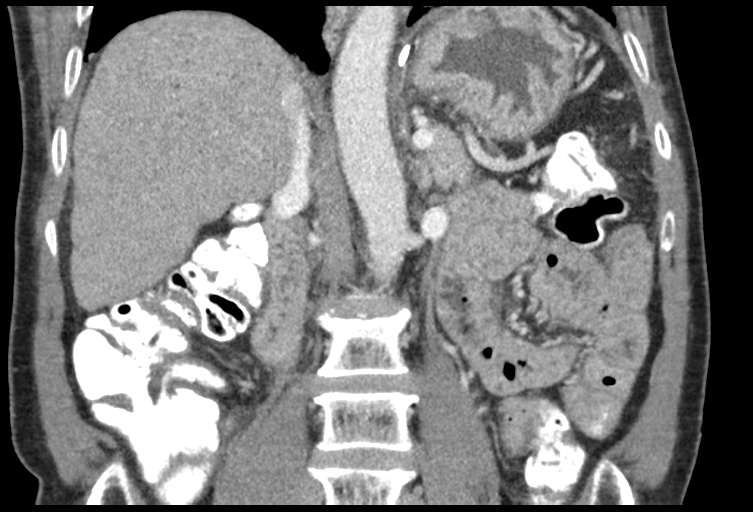

[Series 7: delay · axial · delayed · 0.81mm/px · z∈[-387,-212]mm · 8 of 47 slices shown, 9 images]
[im 6/47  soft-tissue]
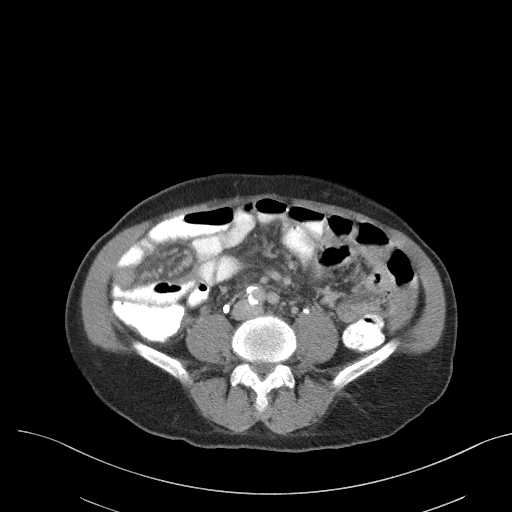
[im 6/47  bone]
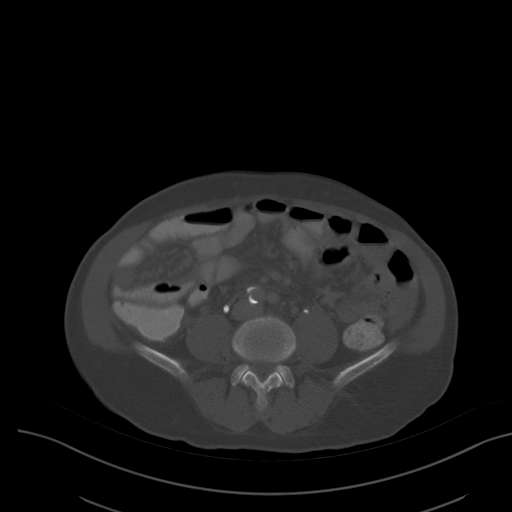
[im 11/47  soft-tissue]
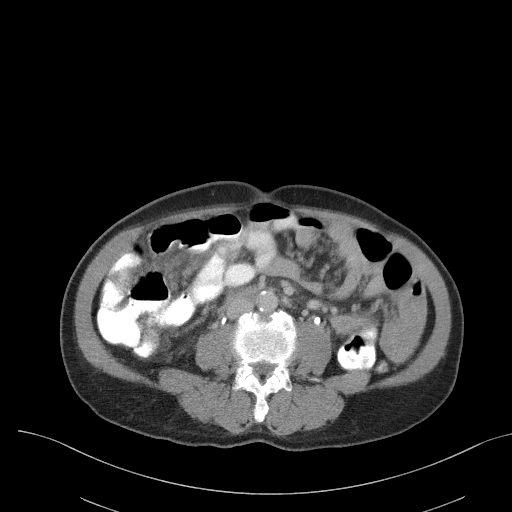
[im 16/47  soft-tissue]
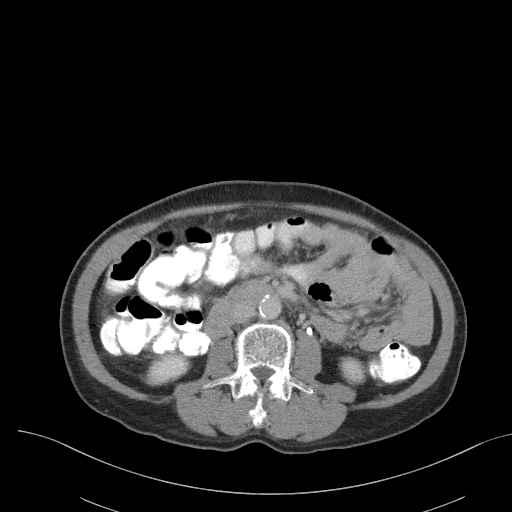
[im 21/47  soft-tissue]
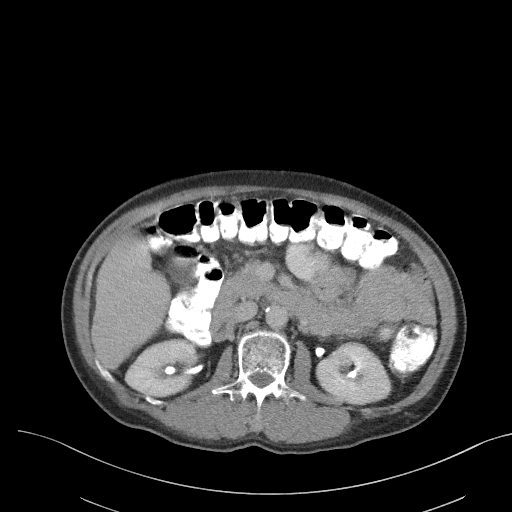
[im 26/47  soft-tissue]
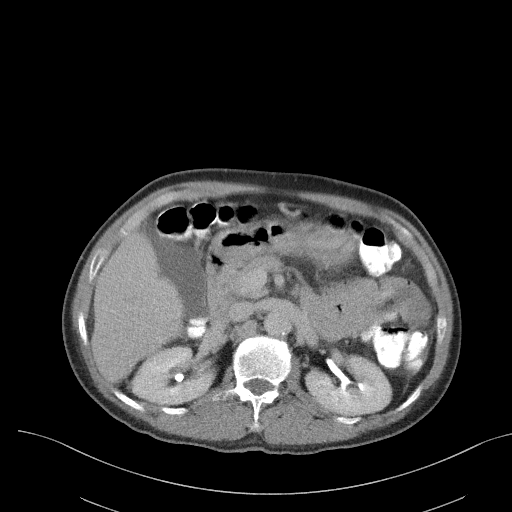
[im 31/47  soft-tissue]
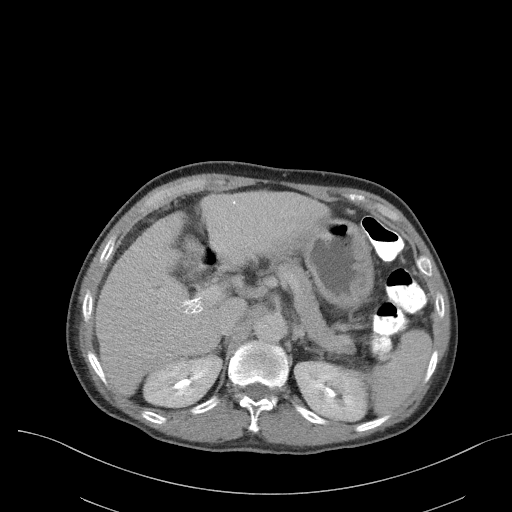
[im 36/47  soft-tissue]
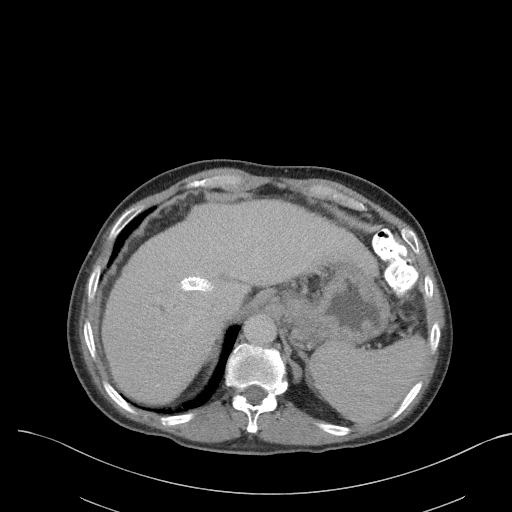
[im 41/47  soft-tissue]
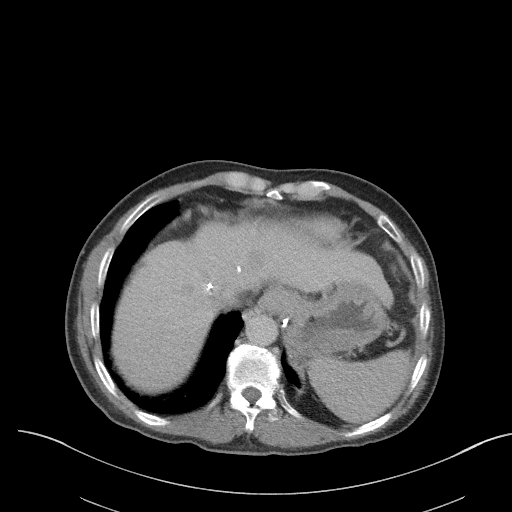

[15 of 46 positions shown; findings below may reference images not displayed]

FINDINGS: Lower chest: Incidental imaging of the lung bases with atelectasis.
No effusion. No consolidation.

Hepatobiliary: Area of enhancement on arterial phase measuring 2.7 x
1.9 cm but on delayed phase measuring 2.1 x 1.7 cm compatible with
slightly greater than 2 cm SWANK category 5 lesion in the LEFT
hepatic lobe. (Image 8 of series 7)

Vague area of enhancement along the under surface of the LEFT hemi
liver, hepatic subsegment III with subtle potential washout on image
14 of series 7, assessment limited by motion artifact. The area of
enhancement is slightly removed from the area of washout,
enhancement best seen on image 11 of series 2 and in the arterial
enhancement appearing more wedge-shaped than masslike.

No additional foci of enhancement are noted. Above the tips there is
an area of washout with rounded borders measuring 10 mm at the site
of previous abnormal enhancement lateral to the tips in hepatic
subsegment VIII.

Tips is patent.  Portal vein is patent.

No pericholecystic stranding or biliary duct distension. Numerous
gallstones in the dependent gallbladder.

Pancreas: Normal, without mass, inflammation or ductal dilatation.

Spleen: Mild splenomegaly as before.

Adrenals/Urinary Tract: Adrenal glands are normal. Symmetric renal
enhancement. No hydronephrosis.

Stomach/Bowel: Mild gastric to moderate gastric thickening with
similar appearance. Signs of varices. Bowel edema likely related to
portal hypertension, findings are unchanged.

Vascular/Lymphatic: Atheromatous plaque of the abdominal aorta. No
aneurysmal dilation. Patent abdominal vasculature. There is no
gastrohepatic or hepatoduodenal ligament lymphadenopathy. No
retroperitoneal or mesenteric lymphadenopathy.

Other: Resolution of ascites seen on the previous imaging study.

Musculoskeletal: No acute bone finding or destructive bone process.
Spinal degenerative changes.
IMPRESSION: 1. SWANK category 5 lesion in the LEFT hepatic lobe with similar
appearance to slightly greater than 2 cm. Findings of hepatocellular
carcinoma by imaging.
2. Another SWANK category 5 lesion slightly greater than a
cm in hepatic subsegment VIII.
3. Motion limited assessment but likely a third SWANK category 5
lesion in the lateral segment of the LEFT hepatic lobe, hepatic
subsegment III. MRI may be helpful for further evaluation if this is
considered coordination with interpreter may be necessary to ensure
the patient has an understanding of breath hold instructions.
4. Resolution of ascites seen on the previous imaging study.
5. Cholelithiasis without evidence of acute cholecystitis.
6. Gastritis versus is portal gastropathy with varices. Findings are
similar to prior imaging.
7. Bowel edema likely related to portal hypertension, findings are
unchanged.
8. Aortic atherosclerosis.

Aortic Atherosclerosis ([XV]-[XV]).

## 2020-11-23 MED ORDER — IOHEXOL 300 MG/ML  SOLN
100.0000 mL | Freq: Once | INTRAMUSCULAR | Status: AC | PRN
  Administered 2020-11-23: 100 mL via INTRAVENOUS

## 2020-12-12 ENCOUNTER — Other Ambulatory Visit: Payer: Self-pay

## 2020-12-12 NOTE — Congregational Nurse Program (Signed)
Home visit with interpreter Donald Aguilar.  Patient states he is doing pretty well except for pain from hernia, especially when standing or walking.  Asking when he sees surgeon--advised that he will see Dr. Rush Farmer on 2/28 and Medicaid transportation will pick him up for appointment.  Stated he was walking to the grocery store and had to lie down on the sidewalk due to pain. He requested CN get another support for hernia.  We discussed medications and states he is taking them as ordered except he did not have lactulose.  Phone call to [facility] and he does have refills.  He can pick it up after friend gets off work today.  Pharmacist also stated that she had received a request to fill Epclusa but insurance would not cover it without diagnosis information.  [facility] will contact [facility] regarding new prescription to include diagnosis info.  Told patient that Dr. Corena Pilgrim nurse called to say that his last C-T scan shows the spots on his liver have grown and are suspicious for cancer and that  Dr. Loletha Carrow is talking to some other doctors to determine the best treatment for this.  BP 98/60.  Advised patient he sees Dr. Linus Salmons on December 18, 2020 at 10:00 am.  [name] RN, Congregational Nurse [phone]

## 2020-12-12 NOTE — Progress Notes (Signed)
The proposed treatment discussed in conference is for discussion purposes only and is not a binding recommendation.  The patients have not been physically examined, or presented with their treatment options.  Therefore, final treatment plans cannot be decided.   

## 2020-12-17 ENCOUNTER — Other Ambulatory Visit: Payer: Self-pay

## 2020-12-17 ENCOUNTER — Telehealth: Payer: Self-pay | Admitting: Gastroenterology

## 2020-12-17 ENCOUNTER — Other Ambulatory Visit: Payer: Self-pay | Admitting: Surgery

## 2020-12-17 DIAGNOSIS — K769 Liver disease, unspecified: Secondary | ICD-10-CM

## 2020-12-17 NOTE — Telephone Encounter (Signed)
Pt scheduled for MR of liver at [facility] 3/6/22at 1pm, pt to check in at 12:30pm and be NPO for 4 hours prior to exam. Referral faxed to [facility] for appt with Roosevelt Locks NP.

## 2020-12-17 NOTE — Telephone Encounter (Addendum)
Thank you for letting me know.  I spoke with Roosevelt Locks, NP at [facility] about this patient, they will consider offering an office consult and will communicate directly with [name] about that.  - HD

## 2020-12-17 NOTE — Congregational Nurse Program (Signed)
Accompanied patient to [facility] (Dr. Rush Farmer) appointment.  He scheduled patient for Left Inguinal Hernia Repair with M[facility] on 12/28/2020 at 7:30 am at the [facility] [address]  His pre-op Covid test is 12/25/2020 at 11:00 am ([address] [place], [place]).  His follow-up appointment with Dr.Blackmon is 01/25/2021 at 11:20 am.  Will home visit patient on 12/19/2020 with interpreter [name] to review instructions and educational information from Dr. Rush Farmer.  Also reminded patient he has an appointment with Dr. Linus Salmons at the [place] tomorrow at 10:00 am.  [name] RN, Congregational Nurse [phone]

## 2020-12-17 NOTE — Telephone Encounter (Signed)
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  Recent CT scan showed enlarging liver lesions worrisome for hepatocellular carcinoma.  His case was presented at multidisciplinary tumor board last week, and then continued message-based discussion went on the remainder of the week with multiple providers.  The consensus advice at this point is the following:  #1   MRI of the liver with and without contrast to hopefully better define these lesions.  If we can get a good quality MRI and if it is convincing for these lesions being cancer, then a biopsy would not be necessary (and it should be noted that the biopsy would be technically challenging according to the interventional radiologist), and treatment could be offered.  That treatment is likely to be radiation in some fashion.  #2 [facility] for cirrhosis and consideration of liver transplant.  Liver masses, possible HCC.  Attention Donald Locks, NP That clinic is able to access epic, and thus can get all of the [facility] health notes, imaging, endoscopy and lab/pathology reports. The provider that clinic also has my number and we communicate often, so they can let me know if more information is needed once they receive the referral.   It is absolutely essential that a [name] interpreter is present for both this patient's liver MRI and his hepatology clinic evaluation. An MRI requires the patient to lay still and periodically hold their breath to get a good quality set of images.  It is very important that we have is good a quality an MRI is possible in order to try to make this diagnosis in a complex patient. To that end, we need the MRI done prior to his upcoming inguinal hernia repair, which is scheduled on March 11.  I expect he will have some pain after that surgery and might therefore be difficult for him to lay still and periodically breath-hold for good quality MRI.  Thanks very  much  - HD

## 2020-12-17 NOTE — Telephone Encounter (Signed)
Left message for Hoyle Sauer, congregational nurse to call back.

## 2020-12-17 NOTE — Telephone Encounter (Signed)
Spoke with Donald Aguilar and she is aware of the MRI appt. [facility] states he was referred there before and did not keep 3 appts. They has said they would not reschedule him. DIscussed with them if they let Donald Aguilar know about the appts he usually makes the appts. They are going to check with the provider and see if they will agree to see him, if not they will let us know. Dr. Loletha Carrow aware.

## 2020-12-18 ENCOUNTER — Telehealth: Payer: Self-pay

## 2020-12-18 ENCOUNTER — Ambulatory Visit (INDEPENDENT_AMBULATORY_CARE_PROVIDER_SITE_OTHER): Payer: Medicare Other | Admitting: Internal Medicine

## 2020-12-18 ENCOUNTER — Other Ambulatory Visit: Payer: Self-pay

## 2020-12-18 ENCOUNTER — Encounter: Payer: Self-pay | Admitting: Internal Medicine

## 2020-12-18 DIAGNOSIS — Z5181 Encounter for therapeutic drug level monitoring: Secondary | ICD-10-CM

## 2020-12-18 DIAGNOSIS — R160 Hepatomegaly, not elsewhere classified: Secondary | ICD-10-CM | POA: Diagnosis not present

## 2020-12-18 DIAGNOSIS — B182 Chronic viral hepatitis C: Secondary | ICD-10-CM | POA: Diagnosis not present

## 2020-12-18 DIAGNOSIS — K7469 Other cirrhosis of liver: Secondary | ICD-10-CM | POA: Diagnosis not present

## 2020-12-18 NOTE — Telephone Encounter (Signed)
Received fax from [facility], patient is scheduled for an appointment on 01/14/21 at 1:30 PM. [name], notified of appt via epic.

## 2020-12-18 NOTE — Telephone Encounter (Signed)
Spoke with Hoyle Sauer, she states that she spoke with [facility] earlier today and states that they are unable to provide an interpreter for the patient. Hoyle Sauer is requesting that I set up an interpreter for the patient's appointment. She provided me with a phone number to interpreter services at [phone]. Advised that I will see what I can do and I will let her know.   Lm on vm for [facility] interpreter services to return call.

## 2020-12-18 NOTE — Assessment & Plan Note (Signed)
Will check his hepatic panel

## 2020-12-18 NOTE — Assessment & Plan Note (Signed)
Will check his RNA again today and hepatic panel.  I have discussed with him at length the need to continue his Epclusa and did circle the medication name so he could be sure he gets his refills.   He will rtc in 3 months.

## 2020-12-18 NOTE — Progress Notes (Signed)
   Subjective:    Patient ID: Donald Aguilar, male    DOB: [birth year], 70 y.o.   MRN: [id number]  HPI He is here for follow up of hepatitis C and cirrhosis.   He is currently on Epclusa for 24 weeks that started on about 09/17/20 and he is having no issues with the medication.  He was previously referred to hepatology/Liver Care but failed to keep any of his appointments that were arranged for him and in subsequent discussion with Dr. Loletha Carrow, we opted to start treatment of his chronic hepatitis C.   His early in treatment hepatitis C RNA was < 15 after starting treatment from his baseline of 200,000.  He reports no fatigue, no rash or other concerns.  He is followed by Dr. Loletha Carrow for his decompensated liver disease with a history of TIPS in 2021 and recent CT scan for [facility] screening concerning for new [place] along with an elevated AFP of 6.6.  He is unaware if he is taking his Raeanne Gathers though does state he is taking some medications.  He is unaware of any names. His main complaint is abdominal pain and weakness with standing.  He had a recent CT scan and is concerning for [facility].  MRi scheduled for 3/6.  He is again being referred to hepatology though now has a Chief Executive Officer for his care.          Review of Systems  Constitutional: Negative for fatigue.  Gastrointestinal: Negative for diarrhea and nausea.  Skin: Negative for rash.       Objective:   Physical Exam Eyes:     General: No scleral icterus. Cardiovascular:     Rate and Rhythm: Normal rate.  Pulmonary:     Effort: Pulmonary effort is normal.  Neurological:     General: No focal deficit present.     Mental Status: He is alert.  Psychiatric:        Mood and Affect: Mood normal.   SH: no alcohol        Assessment & Plan:

## 2020-12-18 NOTE — Telephone Encounter (Signed)
Spoke with [name] at interpreter services, an initial request has been initiated for a request to have an interpreter at patient's appointment with Roosevelt Locks, NP on 01/14/21 at 1:30 PM. Interpreter services will need to be notified if patient's appointment is changed because they will have no other way of getting this information.   Donald Aguilar has been notified and verbalized understanding of all information and had no concerns at the end of the call.

## 2020-12-18 NOTE — Telephone Encounter (Signed)
Noted, thank you

## 2020-12-18 NOTE — Assessment & Plan Note (Signed)
He is followed by GI and on aldactone.

## 2020-12-18 NOTE — Assessment & Plan Note (Signed)
New finding on CT scan.  Case has been discussed in tumor board and being re referred to hepatology.

## 2020-12-18 NOTE — Telephone Encounter (Signed)
Noted  

## 2020-12-19 NOTE — Congregational Nurse Program (Signed)
Home visit with interpreter [name].  We told patient he has an appointment for MRI of liver on Sunday 12/23/2020 at 12:30 pm at [place] and that transportation has been arranged thru [facility] since [facility] transportation is not available.  Instructed him not to eat or drink anything after 9:00 am that morning. We reminded him of Covid testing appointment on 12/25/20 at 11:00 am and 12/28/2020 surgery appointment at 5:30 am. We also reviewed educational brochure regarding surgery provided by Dr. Rush Farmer.  Will home visit on 12/26/2020 to discuss pre-op instructions .[name] RN, Congregational Nurse [phone]

## 2020-12-20 LAB — COMPLETE METABOLIC PANEL WITH GFR
AG Ratio: 0.9 (calc) — ABNORMAL LOW (ref 1.0–2.5)
ALT: 21 U/L (ref 9–46)
AST: 35 U/L (ref 10–35)
Albumin: 3.5 g/dL — ABNORMAL LOW (ref 3.6–5.1)
Alkaline phosphatase (APISO): 78 U/L (ref 35–144)
BUN: 13 mg/dL (ref 7–25)
CO2: 27 mmol/L (ref 20–32)
Calcium: 9.2 mg/dL (ref 8.6–10.3)
Chloride: 103 mmol/L (ref 98–110)
Creat: 0.88 mg/dL (ref 0.70–1.25)
GFR, Est African American: 102 mL/min/{1.73_m2} (ref >=60)
GFR, Est Non African American: 88 mL/min/{1.73_m2} (ref >=60)
Globulin: 4.1 g/dL (calc) — ABNORMAL HIGH (ref 1.9–3.7)
Glucose, Bld: 77 mg/dL (ref 65–99)
Potassium: 4.3 mmol/L (ref 3.5–5.3)
Sodium: 136 mmol/L (ref 135–146)
Total Bilirubin: 1.6 mg/dL — ABNORMAL HIGH (ref 0.2–1.2)
Total Protein: 7.6 g/dL (ref 6.1–8.1)

## 2020-12-20 LAB — HEPATITIS C RNA QUANTITATIVE
HCV Quantitative Log: <1.18 log IU/mL
HCV RNA, PCR, QN: <15 IU/mL

## 2020-12-23 ENCOUNTER — Other Ambulatory Visit: Payer: Self-pay

## 2020-12-23 ENCOUNTER — Ambulatory Visit (HOSPITAL_COMMUNITY)
Admission: RE | Admit: 2020-12-23 | Discharge: 2020-12-23 | Disposition: A | Discharge status: Home / Self Care | Payer: Medicare Other | Source: Ambulatory Visit | Attending: Gastroenterology | Admitting: Gastroenterology

## 2020-12-23 DIAGNOSIS — K769 Liver disease, unspecified: Secondary | ICD-10-CM

## 2020-12-23 NOTE — Progress Notes (Signed)
Patient arrived for his MRI without an interpreter present. Tried to get in touch with interpreter but services are closed on Saturdays. Needs to be rescheduled.

## 2020-12-24 ENCOUNTER — Telehealth: Payer: Self-pay | Admitting: Gastroenterology

## 2020-12-24 NOTE — Telephone Encounter (Signed)
[name], RN  Yevette Edwards, RN Mr. Tomasetti is rescheduled for the MRI tomorrow (12/25/2020) at 8:00 am at [facility]. Transportation requested thru [facility]. I spoke with [name] at [facility] who will try to assure he has an interpreter. I also had interpreter [name] call him to explain why he was not seen yesterday and tell him about new appointment. Thank yo so much for all your help. Hoyle Sauer

## 2020-12-24 NOTE — Telephone Encounter (Signed)
Spoke with Donald Aguilar, patient did not have MRI, cancellation reason was "No interpreter was available". I provided Donald Aguilar the phone number to reschedule patient's MRI at their convenience. Donald Aguilar is going to still try and get the MRI rescheduled before patient's surgery. Donald Aguilar had no concerns at the end of the call.

## 2020-12-25 ENCOUNTER — Other Ambulatory Visit: Payer: Self-pay | Admitting: Internal Medicine

## 2020-12-25 ENCOUNTER — Other Ambulatory Visit (HOSPITAL_COMMUNITY)
Admission: RE | Admit: 2020-12-25 | Discharge: 2020-12-25 | Disposition: A | Discharge status: Home / Self Care | Payer: Medicare Other | Source: Ambulatory Visit | Attending: Surgery | Admitting: Surgery

## 2020-12-25 ENCOUNTER — Telehealth: Payer: Self-pay

## 2020-12-25 ENCOUNTER — Other Ambulatory Visit: Payer: Self-pay

## 2020-12-25 ENCOUNTER — Ambulatory Visit (HOSPITAL_COMMUNITY)
Admission: RE | Admit: 2020-12-25 | Discharge: 2020-12-25 | Disposition: A | Discharge status: Home / Self Care | Payer: Medicare Other | Source: Ambulatory Visit | Attending: Gastroenterology | Admitting: Gastroenterology

## 2020-12-25 DIAGNOSIS — Z01812 Encounter for preprocedural laboratory examination: Secondary | ICD-10-CM | POA: Insufficient documentation

## 2020-12-25 DIAGNOSIS — K769 Liver disease, unspecified: Secondary | ICD-10-CM | POA: Insufficient documentation

## 2020-12-25 DIAGNOSIS — Z20822 Contact with and (suspected) exposure to covid-19: Secondary | ICD-10-CM | POA: Insufficient documentation

## 2020-12-25 DIAGNOSIS — K7469 Other cirrhosis of liver: Secondary | ICD-10-CM

## 2020-12-25 LAB — SARS CORONAVIRUS 2 (TAT 6-24 HRS): SARS Coronavirus 2: NEGATIVE

## 2020-12-25 IMAGING — MR MR ABDOMEN WO/W CM
18 series · 48 of 48 positions shown · IV contrast (6ml GADAVIST)
Comparison: CT scan [DATE].

CLINICAL DATA: Cirrhosis with IRLOV 5 lesion seen on previous CT.

EXAM:
MRI ABDOMEN WITHOUT AND WITH CONTRAST
TECHNIQUE: Multiplanar multisequence MR imaging of the abdomen was performed
both before and after the administration of intravenous contrast.
CONTRAST:  6mL GADAVIST GADOBUTROL 1 MMOL/ML IV SOLN

[Series 2: haste_cor_mbh · coronal · 6.0mm · 1.41mm/px · 2 of 32 slices shown]
[im 1/32]
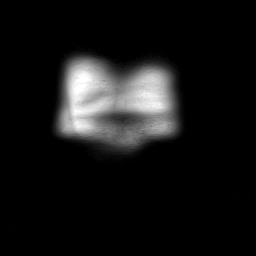
[im 32/32]
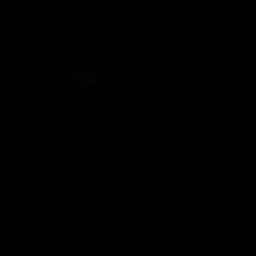

[Series 4: ax_trufi_mbh · axial · 6.0mm · 1.04mm/px · z∈[-186,+60]mm · 3 of 42 slices shown]
[im 1/42]
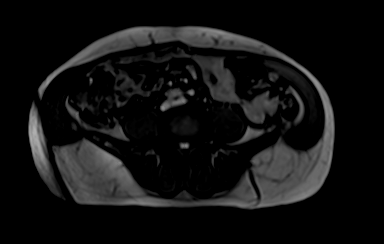
[im 21/42]
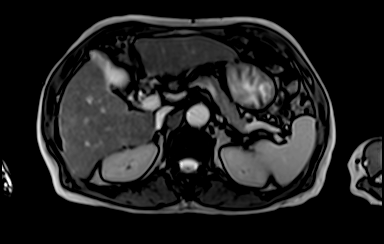
[im 42/42]
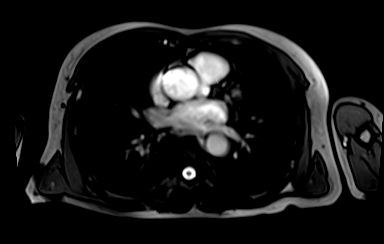

[Series 5: T2 fat-sat · axial · 6.0mm · 1.12mm/px · z∈[-192,+60]mm · 2 of 36 slices shown]
[im 1/36]
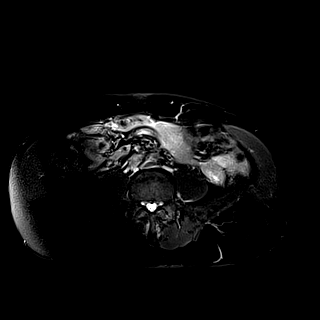
[im 36/36]
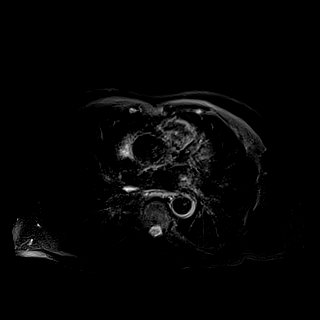

[Series 7: ax_diff_fb_tracew_dfc_mix · axial · 6.0mm · 1.42mm/px · z∈[-217,+79]mm · 3 of 84 slices shown]
[im 1/84]
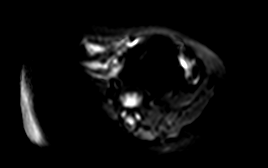
[im 42/84]
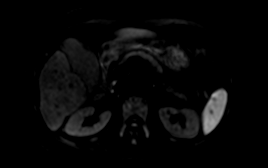
[im 84/84]
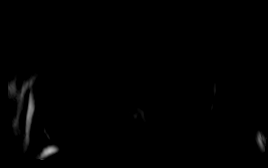

[Series 8: ax_diff_fb_adc_dfc_mix · axial · 6.0mm · 1.42mm/px · z∈[-217,+79]mm · 2 of 42 slices shown]
[im 1/42]
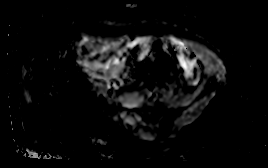
[im 42/42]
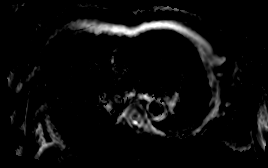

[Series 10: T1 dynamic · axial · 3.0mm · 1.19mm/px · z∈[-199,+62]mm · 3 of 88 slices shown (1 of 9)]
[im 1/88]
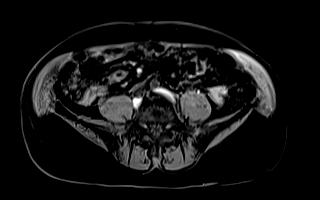
[im 44/88]
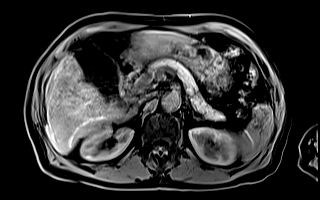
[im 88/88]
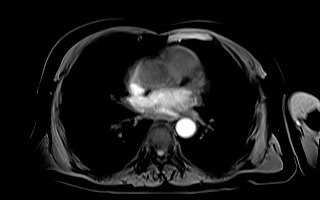

[Series 13: T1 dynamic · axial · 3.0mm · 1.19mm/px · z∈[-199,+62]mm · 3 of 88 slices shown (2 of 9)]
[im 1/88]
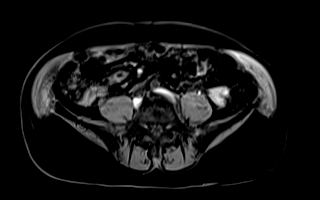
[im 44/88]
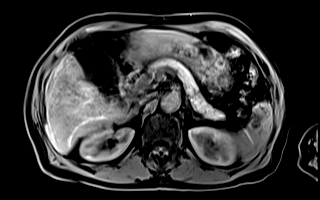
[im 88/88]
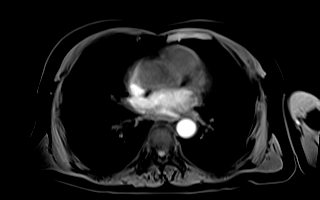

[Series 15: T1 dynamic · axial · 3.0mm · 1.19mm/px · z∈[-199,+62]mm · 3 of 88 slices shown (3 of 9)]
[im 1/88]
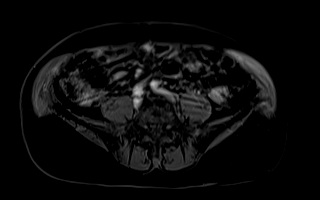
[im 44/88]
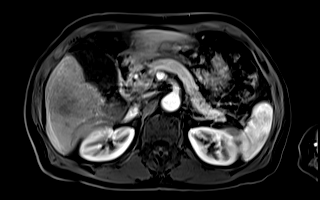
[im 88/88]
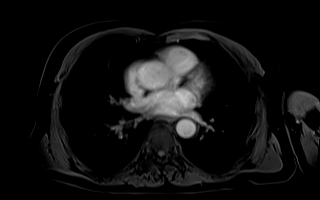

[Series 16: T1 dynamic · axial · 3.0mm · 1.19mm/px · z∈[-199,+62]mm · 3 of 88 slices shown (4 of 9)]
[im 1/88]
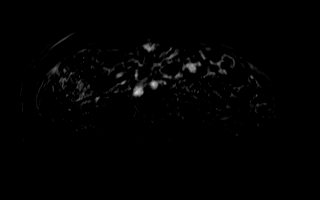
[im 44/88]
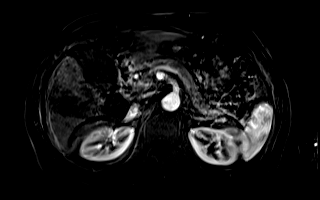
[im 88/88]
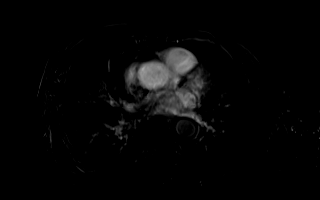

[Series 18: T1 dynamic · axial · 3.0mm · 1.19mm/px · z∈[-199,+62]mm · 3 of 88 slices shown (5 of 9)]
[im 1/88]
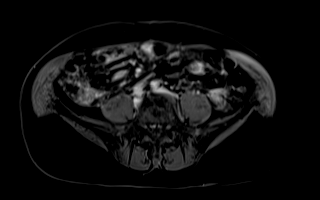
[im 44/88]
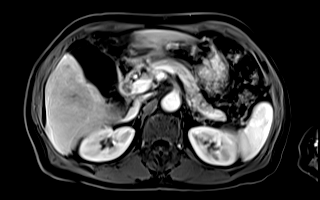
[im 88/88]
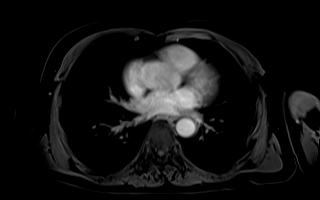

[Series 19: T1 dynamic · axial · 3.0mm · 1.19mm/px · z∈[-199,+62]mm · 3 of 88 slices shown (6 of 9)]
[im 1/88]
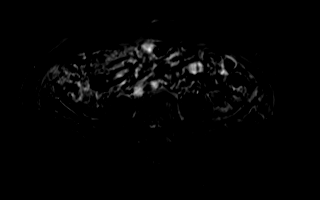
[im 44/88]
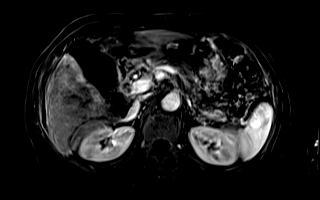
[im 88/88]
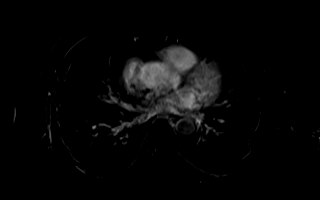

[Series 21: T1 dynamic · axial · 3.0mm · 1.19mm/px · z∈[-199,+62]mm · 3 of 88 slices shown (7 of 9)]
[im 1/88]
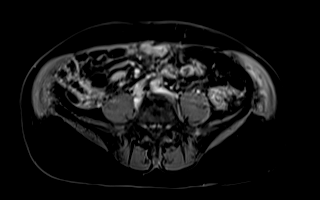
[im 44/88]
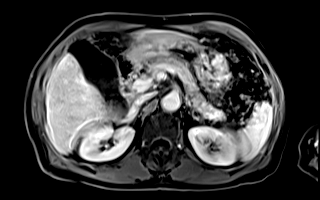
[im 88/88]
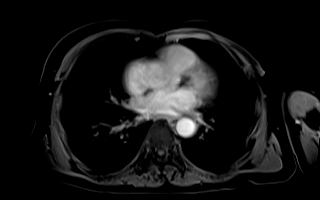

[Series 22: T1 dynamic · axial · 3.0mm · 1.19mm/px · z∈[-199,+62]mm · 3 of 88 slices shown (8 of 9)]
[im 1/88]
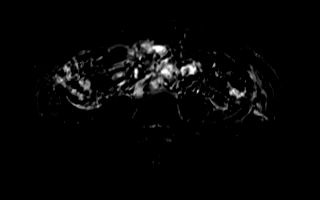
[im 44/88]
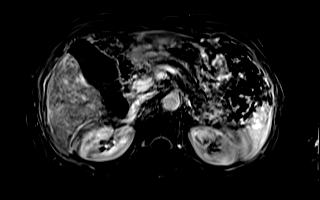
[im 88/88]
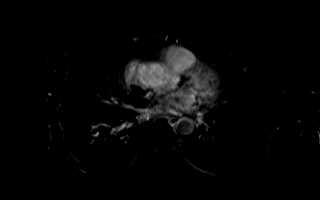

[Series 23: ax_haste_mbh · axial · 6.0mm · 1.19mm/px · 1 of 35 slices shown]
[im 1/35]
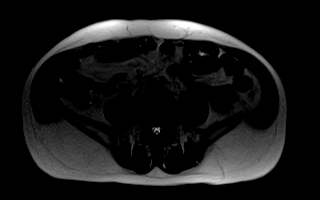

[Series 25: cor_vibe_dixon_delayed_w · coronal · 3.5mm · 1.25mm/px · 2 of 60 slices shown]
[im 1/60]
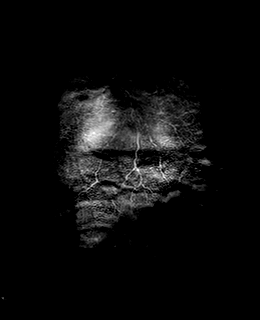
[im 60/60]
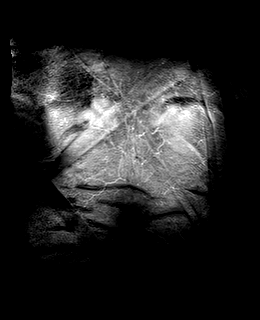

[Series 27: ax_dixon_delayed_w_reg · axial · 3.0mm · 1.19mm/px · z∈[-199,+62]mm · 3 of 88 slices shown]
[im 1/88]
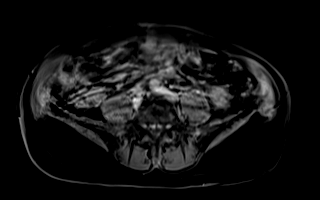
[im 44/88]
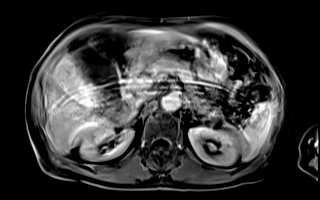
[im 88/88]
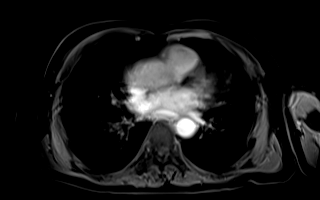

[Series 28: ax_dixon_delayed_w_reg_sub · axial · 3.0mm · 1.19mm/px · z∈[-199,+62]mm · 3 of 88 slices shown]
[im 1/88]
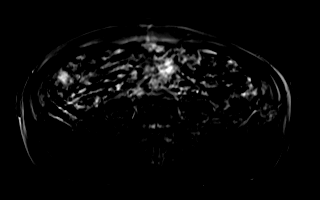
[im 44/88]
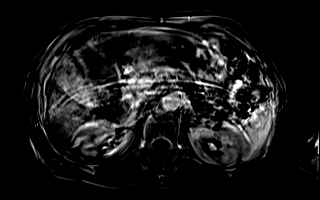
[im 88/88]
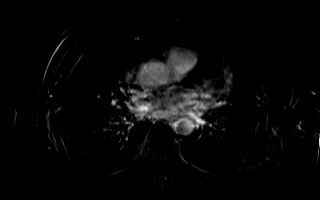

[Series 30: T1 dynamic · axial · 3.0mm · 1.56mm/px · z∈[-199,+62]mm · 3 of 88 slices shown (9 of 9)]
[im 1/88]
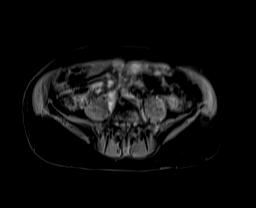
[im 44/88]
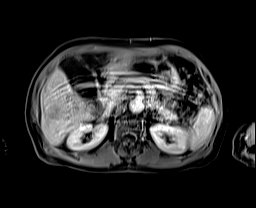
[im 88/88]
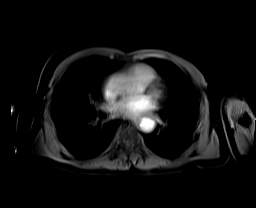

[48 of 48 positions shown; findings below may reference images not displayed]

FINDINGS: This exam is markedly motion degraded. Assessment of anatomy on
postcontrast imaging is largely nondiagnostic.

Lower chest: Unremarkable.

Hepatobiliary: Subtle nodularity of liver contour seen on previous
CT is less discernible on today's motion degraded exam.

The well-defined enhancing lesion in the left hepatic lobe seen on
portal venous phase imaging of previous CT is perceptible today
although assessment is markedly degraded by motion from breathing
artifact and adjacent cardiac motion. There is a suggestion of
restricted diffusion in this region on precontrast imaging. 2.1 cm
lesion shows arterial phase diffuse hyperenhancement with washout
and persistent enhancing capsule (45 second postcontrast image 25 of
series 18). More delayed postcontrast imaging suffers from greater
motion degradation, but there does appear to be persistent washout
(25/21).

No focal lesion evident in segment III to correspond to the
abnormality described on the previous CT.

10 mm focus of described washout in segment VIII, described as above
the tips) shows no arterial phase hyperenhancement today, but given
the substantial motion degradation, imaging may be nondiagnostic in
this area.

Gallbladder is distended without evidence of stones. No intra or
extrahepatic biliary duct dilatation. Tips shunt again noted.

Pancreas: No focal mass lesion. No dilatation of the main duct. No
intraparenchymal cyst. No peripancreatic edema.

Spleen:  No splenomegaly. No focal mass lesion.

Adrenals/Urinary Tract: No adrenal nodule or mass. Tiny cortical T2
hyperintensities in both kidneys are too small to characterize but
likely benign.

Stomach/Bowel: Stomach is unremarkable. No gastric wall thickening.
No evidence of outlet obstruction. Duodenum is normally positioned
as is the ligament of Treitz. No small bowel or colonic dilatation
within the visualized abdomen.

Vascular/Lymphatic: No abdominal aortic aneurysm portal vein and
superior mesenteric vein are patent. Splenic vein appears patent.
Paraesophageal varices noted. There is no gastrohepatic or
hepatoduodenal ligament lymphadenopathy. No retroperitoneal or
mesenteric lymphadenopathy.

Other:  No intraperitoneal free fluid.

Musculoskeletal: No focal suspicious marrow enhancement within the
visualized bony anatomy.
IMPRESSION: 1. 2.1 cm focus of arterial phase hyperenhancement in the dome of
segment II shows enhancing capsule and non peripheral washout,
consistent with IRLOV category 5 lesion and consistent with
hepatocellular carcinoma.
2. Today's study is markedly motion degraded due to breathing motion
and the other 2 areas of concern on previous CT are not clearly
demonstrated by MRI today. Previous exam was not a multiphase liver
CT and repeat multiphase hepatic CT may prove helpful to further
evaluate given better temporal resolution of CT imaging.
3. Paraesophageal varices suggest portal venous hypertension.

## 2020-12-25 MED ORDER — GADOBUTROL 1 MMOL/ML IV SOLN
6.0000 mL | Freq: Once | INTRAVENOUS | Status: AC | PRN
  Administered 2020-12-25: 6 mL via INTRAVENOUS

## 2020-12-25 MED FILL — EPCLUSA 400 MG-100 MG TAB: 400-100 | 28 days supply | Qty: 28 | Fill #0

## 2020-12-25 NOTE — Telephone Encounter (Signed)
Thanks

## 2020-12-25 NOTE — Telephone Encounter (Signed)
[facility] Patient Advocate Encounter   Received notification from [facility] that prior authorization for Donald Aguilar is required.   PA submitted on 12/25/20 [place] [id number] Status is pending  PA is needed for the next 12 weeks.  [facility] will continue to follow.   [name], [facility] Patient [facility] Phone: [phone] Fax:  [phone]

## 2020-12-25 NOTE — Telephone Encounter (Signed)
[facility] Patient Advocate Encounter  Prior Authorization for Raeanne Gathers has been approved.    Effective dates: 12/25/20 through 03/19/21  Patients co-pay is $0.00.   [facility] will continue to follow.  [name], [facility] Patient [facility] Phone: [phone] Fax:  [phone]

## 2020-12-25 NOTE — Congregational Nurse Program (Signed)
Home visit following phone call from patient stating he is almost out of some of his medications but unable to communicate what he needed. Patient had new bottles of KCL and Spironolactone and plenty of Furosemide.  He only had 3 Epclusa and a small amount of Constulose.  Requested refills on both.  [facility] will contact Dr. Truman Hayward regarding Constulose.  Called [facility] out-patient pharmacy regarding Raeanne Gathers but they stated patient had requested prescription be transferred to [facility].  [place] said they needed a new prescription to include diagnosis before the insurance will allow them to fill it.  Phone call to Butch Penny at [facility].  She was able to get authorization for Epclusa to be filled at the [facility].  CN picked it up and will deliver to patient tomorrow. Interpreter will be present for that visit tomorrow and we will ask patient if he wants final 2 refills mailed to him when due.  [name] RN, Congregational Nurse [phone]

## 2020-12-26 NOTE — H&P (Signed)
  Donald Aguilar Appointment: 12/17/2020 10:20 AM Location: [facility] Patient #: [phone] DOB: [birth year] Single / Language: Donald Aguilar / Race: Refused to Report/Unreported Male   History of Present Illness (Donald Aguilar A. Ninfa Linden MD; 12/17/2020 10:43 AM) The patient is a 70 year old male who presents with an inguinal hernia.  Chief complaint: Painful left inguinal hernia  This is a 70 year old gentleman with cirrhosis and possible hepatocellular carcinoma who has a increasingly symptomatic left inguinal hernia. The hernia was much worse from a swelling standpoint with his ascites was not under control. He had multiple paracentesis procedures last year. He has no medications which are better controlled his cirrhosis. He is at least child's B cirrhotic. He is having increasing severe sharp pain with the hernia and occasional nausea. He has been able to reduce it. He denies jaundice.   Allergies Donald Forehand, CNA; 12/17/2020 10:29 AM) No Known Drug Allergies  [12/17/2020]: Allergies Reconciled   Medication History Donald Forehand, CNA; 12/17/2020 10:29 AM) Donald Aguilar (400-100MG  Tablet, Oral) Active. Medications Reconciled  Vitals (Donald Alston CNA; 12/17/2020 10:29 AM) 12/17/2020 10:29 AM Weight: 138 lb Height: 65in Body Surface Area: 1.69 m Body Mass Index: 22.96 kg/m  Temp.: 98.1F  Pulse: 90 (Regular)  P.OX: 97% (Room air) BP: 98/80(Sitting, Left Arm, Standard)       Physical Exam (Donald Aguilar A. Ninfa Linden MD; 12/17/2020 10:43 AM) The physical exam findings are as follows: Note: He appears uncomfortable today  His abdomen is soft and nontender. There is not appear to be ascites currently. There is no ascites in the scrotum. He has a very tender, easily reducible left inguinal hernia    Assessment & Plan (Donald Aguilar A. Ninfa Linden MD; 12/17/2020 10:44 AM) LEFT INGUINAL HERNIA (K40.90) Impression: I reviewed his notes in the electronic medical records  including CT scans.  This is a difficult situation regarding his cirrhosis. He is currently symptomatic from the hernia and I suspect he will only get worse. I believe he needs urgent repair now well his ascites is under control and while he is still undergoing workup of the potential hepatocellular carcinoma. We discussed the risks which includes but is not limited to bleeding, infection, use of mesh, hernia recurrence, the need for further procedures, cardiopulmonary issues, etc. He understands and wishes to proceed. Surgery will be scheduled urgently. Current Plans

## 2020-12-26 NOTE — Congregational Nurse Program (Signed)
Home visit with interpreter [name].  Donald Aguilar to patient and asked if he wanted to have next 2 refills mailed to him and he said yes.  Tawanna Solo at [facility] to relay this information.  She will automatically send medication to him when due.  Also Called [facility] to see if they received new prescription for [name] and they stated It is ready for pickup. Reminded him he has to quarantine until after surgery.  His landlord/friend (Y'Phi) agreed to pick up medicine for him.  Reviewed pre-op instructions to be NPO after midnight tomorrow.  Y'Phi will stay home from work Friday to be with patient following surgery. [name] RN, Congregational Nurse [phone]

## 2020-12-27 ENCOUNTER — Other Ambulatory Visit: Payer: Self-pay

## 2020-12-27 ENCOUNTER — Encounter (HOSPITAL_COMMUNITY): Payer: Self-pay | Admitting: Surgery

## 2020-12-27 NOTE — Progress Notes (Signed)
I spoke to Donald Aguilar 's nurse, Donald Aguilar that makes home visits and handles  Transportation, office visits, mediation refills, etc. Donald Aguilar lives with 4 men; the landlord/friend will be at home to be with -patient the 24 hours after surgery. Donald Aguilar will drive Donald Aguilar home, the nurse works we with an interpreter that transulates instructions to patient.Covid test as negative on 12/25/20, patient has been in home with those he lives with. An Interpreter has been requested t meet patient in the Admission office.

## 2020-12-28 ENCOUNTER — Other Ambulatory Visit: Payer: Self-pay | Admitting: Gastroenterology

## 2020-12-28 ENCOUNTER — Other Ambulatory Visit: Payer: Self-pay

## 2020-12-28 ENCOUNTER — Ambulatory Visit (HOSPITAL_COMMUNITY)
Admission: RE | Admit: 2020-12-28 | Discharge: 2020-12-28 | Disposition: A | Discharge status: Home / Self Care | Payer: Medicare Other | Attending: Surgery | Admitting: Surgery

## 2020-12-28 ENCOUNTER — Encounter (HOSPITAL_COMMUNITY)
Admission: RE | Disposition: A | Discharge status: Home / Self Care | Payer: Self-pay | Source: Home / Self Care | Attending: Surgery

## 2020-12-28 ENCOUNTER — Encounter (HOSPITAL_COMMUNITY): Payer: Self-pay | Admitting: Surgery

## 2020-12-28 ENCOUNTER — Ambulatory Visit (HOSPITAL_COMMUNITY): Payer: Medicare Other | Admitting: Anesthesiology

## 2020-12-28 DIAGNOSIS — Z79899 Other long term (current) drug therapy: Secondary | ICD-10-CM | POA: Diagnosis not present

## 2020-12-28 DIAGNOSIS — R188 Other ascites: Secondary | ICD-10-CM | POA: Diagnosis not present

## 2020-12-28 DIAGNOSIS — F17210 Nicotine dependence, cigarettes, uncomplicated: Secondary | ICD-10-CM | POA: Diagnosis not present

## 2020-12-28 DIAGNOSIS — K769 Liver disease, unspecified: Secondary | ICD-10-CM

## 2020-12-28 DIAGNOSIS — K7460 Unspecified cirrhosis of liver: Secondary | ICD-10-CM | POA: Insufficient documentation

## 2020-12-28 DIAGNOSIS — K4090 Unilateral inguinal hernia, without obstruction or gangrene, not specified as recurrent: Secondary | ICD-10-CM | POA: Insufficient documentation

## 2020-12-28 DIAGNOSIS — K7031 Alcoholic cirrhosis of liver with ascites: Secondary | ICD-10-CM

## 2020-12-28 HISTORY — DX: Alcohol abuse, in remission: F10.11

## 2020-12-28 HISTORY — PX: INGUINAL HERNIA REPAIR: SHX194

## 2020-12-28 LAB — CBC
HCT: 42.7 % (ref 39.0–52.0)
Hemoglobin: 14.6 g/dL (ref 13.0–17.0)
MCH: 32.8 pg (ref 26.0–34.0)
MCHC: 34.2 g/dL (ref 30.0–36.0)
MCV: 96 fL (ref 80.0–100.0)
Platelets: 181 10*3/uL (ref 150–400)
RBC: 4.45 MIL/uL (ref 4.22–5.81)
RDW: 14.6 % (ref 11.5–15.5)
WBC: 8.3 10*3/uL (ref 4.0–10.5)
nRBC: 0 % (ref 0.0–0.2)

## 2020-12-28 LAB — PROTIME-INR
INR: 1.3 — ABNORMAL HIGH (ref 0.8–1.2)
Prothrombin Time: 15.2 seconds (ref 11.4–15.2)

## 2020-12-28 SURGERY — REPAIR, HERNIA, INGUINAL, ADULT
Anesthesia: General | Site: Inguinal | Laterality: Left

## 2020-12-28 MED ORDER — GLYCOPYRROLATE 0.2 MG/ML IJ SOLN
INTRAMUSCULAR | Status: DC | PRN
  Administered 2020-12-28: .2 mg via INTRAVENOUS

## 2020-12-28 MED ORDER — ONDANSETRON HCL 4 MG/2ML IJ SOLN
INTRAMUSCULAR | Status: DC | PRN
  Administered 2020-12-28: 4 mg via INTRAVENOUS

## 2020-12-28 MED ORDER — ACETAMINOPHEN 500 MG PO TABS: 1000.0000 mg | ORAL_TABLET | Freq: Once | ORAL | Status: DC

## 2020-12-28 MED ORDER — BUPIVACAINE-EPINEPHRINE 0.5% -1:200000 IJ SOLN
INTRAMUSCULAR | Status: DC | PRN
  Administered 2020-12-28: 20 mL

## 2020-12-28 MED ORDER — GLYCOPYRROLATE PF 0.2 MG/ML IJ SOSY
PREFILLED_SYRINGE | INTRAMUSCULAR | Status: AC
  Filled 2020-12-28: qty 1

## 2020-12-28 MED ORDER — LACTATED RINGERS IV SOLN: INTRAVENOUS | Status: DC | PRN

## 2020-12-28 MED ORDER — ORAL CARE MOUTH RINSE: 15.0000 mL | Freq: Once | OROMUCOSAL | Status: AC

## 2020-12-28 MED ORDER — DEXAMETHASONE SODIUM PHOSPHATE 10 MG/ML IJ SOLN
INTRAMUSCULAR | Status: DC | PRN
  Administered 2020-12-28: 10 mg via INTRAVENOUS

## 2020-12-28 MED ORDER — SUFENTANIL CITRATE 50 MCG/ML IV SOLN
INTRAVENOUS | Status: AC
  Filled 2020-12-28: qty 1

## 2020-12-28 MED ORDER — TRAMADOL HCL 50 MG PO TABS: 50.0000 mg | ORAL_TABLET | Freq: Four times a day (QID) | ORAL | 0 refills | Status: DC | PRN

## 2020-12-28 MED ORDER — SUCCINYLCHOLINE 20MG/ML (10ML) SYRINGE FOR MEDFUSION PUMP - OPTIME
INTRAMUSCULAR | Status: DC | PRN
  Administered 2020-12-28: 100 mg via INTRAVENOUS

## 2020-12-28 MED ORDER — PHENYLEPHRINE HCL (PRESSORS) 10 MG/ML IV SOLN
INTRAVENOUS | Status: DC | PRN
  Administered 2020-12-28 (×3): 80 ug via INTRAVENOUS

## 2020-12-28 MED ORDER — MIDAZOLAM HCL 2 MG/2ML IJ SOLN
INTRAMUSCULAR | Status: AC
  Filled 2020-12-28: qty 2

## 2020-12-28 MED ORDER — LIDOCAINE 2% (20 MG/ML) 5 ML SYRINGE
INTRAMUSCULAR | Status: AC
  Filled 2020-12-28: qty 5

## 2020-12-28 MED ORDER — ONDANSETRON HCL 4 MG/2ML IJ SOLN
INTRAMUSCULAR | Status: AC
  Filled 2020-12-28: qty 2

## 2020-12-28 MED ORDER — SODIUM CHLORIDE (PF) 0.9 % IJ SOLN
INTRAMUSCULAR | Status: AC
  Filled 2020-12-28: qty 10

## 2020-12-28 MED ORDER — CEFAZOLIN SODIUM-DEXTROSE 2-4 GM/100ML-% IV SOLN
2.0000 g | INTRAVENOUS | Status: AC
  Administered 2020-12-28: 2 g via INTRAVENOUS
  Filled 2020-12-28: qty 100

## 2020-12-28 MED ORDER — PROPOFOL 10 MG/ML IV BOLUS
INTRAVENOUS | Status: DC | PRN
  Administered 2020-12-28: 30 mg via INTRAVENOUS
  Administered 2020-12-28: 100 mg via INTRAVENOUS

## 2020-12-28 MED ORDER — DEXAMETHASONE SODIUM PHOSPHATE 10 MG/ML IJ SOLN
INTRAMUSCULAR | Status: AC
  Filled 2020-12-28: qty 1

## 2020-12-28 MED ORDER — SUFENTANIL CITRATE 50 MCG/ML IV SOLN
INTRAVENOUS | Status: DC | PRN
  Administered 2020-12-28: 10 ug via INTRAVENOUS

## 2020-12-28 MED ORDER — CHLORHEXIDINE GLUCONATE 0.12 % MT SOLN
15.0000 mL | Freq: Once | OROMUCOSAL | Status: AC
  Administered 2020-12-28: 15 mL via OROMUCOSAL
  Filled 2020-12-28: qty 15

## 2020-12-28 MED ORDER — 0.9 % SODIUM CHLORIDE (POUR BTL) OPTIME
TOPICAL | Status: DC | PRN
  Administered 2020-12-28: 1000 mL

## 2020-12-28 MED ORDER — CHLORHEXIDINE GLUCONATE CLOTH 2 % EX PADS: 6.0000 | MEDICATED_PAD | Freq: Once | CUTANEOUS | Status: DC

## 2020-12-28 MED ORDER — BUPIVACAINE HCL (PF) 0.25 % IJ SOLN
INTRAMUSCULAR | Status: AC
  Filled 2020-12-28: qty 30

## 2020-12-28 MED ORDER — LIDOCAINE HCL (CARDIAC) PF 100 MG/5ML IV SOSY
PREFILLED_SYRINGE | INTRAVENOUS | Status: DC | PRN
  Administered 2020-12-28: 60 mg via INTRAVENOUS

## 2020-12-28 MED ORDER — LACTATED RINGERS IV SOLN: INTRAVENOUS | Status: DC

## 2020-12-28 MED ORDER — PHENYLEPHRINE 40 MCG/ML (10ML) SYRINGE FOR IV PUSH (FOR BLOOD PRESSURE SUPPORT)
PREFILLED_SYRINGE | INTRAVENOUS | Status: AC
  Filled 2020-12-28: qty 10

## 2020-12-28 MED ORDER — PROPOFOL 10 MG/ML IV BOLUS
INTRAVENOUS | Status: AC
  Filled 2020-12-28: qty 20

## 2020-12-28 MED ORDER — SUCCINYLCHOLINE CHLORIDE 200 MG/10ML IV SOSY
PREFILLED_SYRINGE | INTRAVENOUS | Status: AC
  Filled 2020-12-28: qty 10

## 2020-12-28 SURGICAL SUPPLY — 35 items
ADH SKN CLS APL DERMABOND .7 (GAUZE/BANDAGES/DRESSINGS) ×1
ADH SKN CLS LQ APL DERMABOND (GAUZE/BANDAGES/DRESSINGS) ×1
APL PRP STRL LF DISP 70% ISPRP (MISCELLANEOUS) ×1
BLADE CLIPPER SURG (BLADE) ×2 IMPLANT
CHLORAPREP W/TINT 26 (MISCELLANEOUS) ×2 IMPLANT
COVER SURGICAL LIGHT HANDLE (MISCELLANEOUS) ×2 IMPLANT
DERMABOND ADHESIVE PROPEN (GAUZE/BANDAGES/DRESSINGS) ×1
DERMABOND ADVANCED (GAUZE/BANDAGES/DRESSINGS) ×1
DERMABOND ADVANCED .7 DNX12 (GAUZE/BANDAGES/DRESSINGS) ×1 IMPLANT
DERMABOND ADVANCED .7 DNX6 (GAUZE/BANDAGES/DRESSINGS) ×1 IMPLANT
DRAPE LAPAROTOMY TRNSV 102X78 (DRAPES) ×2 IMPLANT
ELECT REM PT RETURN 9FT ADLT (ELECTROSURGICAL) ×2
ELECTRODE REM PT RTRN 9FT ADLT (ELECTROSURGICAL) ×1 IMPLANT
GLOVE SURG SIGNA 7.5 PF LTX (GLOVE) ×2 IMPLANT
GOWN STRL REUS W/ TWL LRG LVL3 (GOWN DISPOSABLE) ×1 IMPLANT
GOWN STRL REUS W/ TWL XL LVL3 (GOWN DISPOSABLE) ×1 IMPLANT
GOWN STRL REUS W/TWL LRG LVL3 (GOWN DISPOSABLE) ×2
GOWN STRL REUS W/TWL XL LVL3 (GOWN DISPOSABLE) ×2
KIT BASIN OR (CUSTOM PROCEDURE TRAY) ×2 IMPLANT
KIT TURNOVER KIT B (KITS) ×2 IMPLANT
MESH PARIETEX PROGRIP LEFT (Mesh General) ×2 IMPLANT
NEEDLE HYPO 25GX1X1/2 BEV (NEEDLE) ×2 IMPLANT
NS IRRIG 1000ML POUR BTL (IV SOLUTION) ×2 IMPLANT
PACK GENERAL/GYN (CUSTOM PROCEDURE TRAY) ×2 IMPLANT
PAD ARMBOARD 7.5X6 YLW CONV (MISCELLANEOUS) ×2 IMPLANT
PENCIL SMOKE EVACUATOR (MISCELLANEOUS) ×2 IMPLANT
SUT MON AB 4-0 PC3 18 (SUTURE) ×2 IMPLANT
SUT SILK 2 0 SH (SUTURE) ×2 IMPLANT
SUT VIC AB 2-0 CT1 27 (SUTURE) ×2
SUT VIC AB 2-0 CT1 TAPERPNT 27 (SUTURE) ×1 IMPLANT
SUT VIC AB 3-0 CT1 27 (SUTURE) ×2
SUT VIC AB 3-0 CT1 TAPERPNT 27 (SUTURE) ×1 IMPLANT
SYR CONTROL 10ML LL (SYRINGE) ×2 IMPLANT
TOWEL GREEN STERILE (TOWEL DISPOSABLE) ×2 IMPLANT
TOWEL GREEN STERILE FF (TOWEL DISPOSABLE) ×2 IMPLANT

## 2020-12-28 NOTE — Anesthesia Procedure Notes (Signed)
Procedure Name: Intubation Date/Time: 12/28/2020 7:40 AM Performed by: Claris Che, CRNA Pre-anesthesia Checklist: Patient identified, Emergency Drugs available, Suction available, Patient being monitored and Timeout performed Patient Re-evaluated:Patient Re-evaluated prior to induction Oxygen Delivery Method: Circle system utilized Preoxygenation: Pre-oxygenation with 100% oxygen Induction Type: IV induction, Rapid sequence and Cricoid Pressure applied Laryngoscope Size: Mac and 4 Grade View: Grade II Tube size: 8.0 mm Number of attempts: 1 Airway Equipment and Method: Stylet Placement Confirmation: ETT inserted through vocal cords under direct vision,  positive ETCO2 and breath sounds checked- equal and bilateral Secured at: 23 cm Tube secured with: Tape Dental Injury: Teeth and Oropharynx as per pre-operative assessment

## 2020-12-28 NOTE — Anesthesia Preprocedure Evaluation (Signed)
Anesthesia Evaluation  Patient identified by MRN, date of birth, ID band Patient awake    Reviewed: Allergy & Precautions, NPO status , Patient's Chart, lab work & pertinent test results  Airway Mallampati: II  TM Distance: >3 FB Neck ROM: Full    Dental no notable dental hx.    Pulmonary Current Smoker,  15 pack year history    Pulmonary exam normal breath sounds clear to auscultation       Cardiovascular +CHF (grade 1 diastolic dysfunction, normal LVEF on echo 2021)  Normal cardiovascular exam+ Valvular Problems/Murmurs (mild MR) MR  Rhythm:Regular Rate:Normal  Chronic hypotension  Echo 02/2020: 1. Moderate ascites is present.  2. Left ventricular ejection fraction, by estimation, is 60 to 65%. The  left ventricle has normal function. The left ventricle has no regional  wall motion abnormalities. Left ventricular diastolic parameters are  consistent with Grade I diastolic  dysfunction (impaired relaxation). The average left ventricular global  longitudinal strain is -22.3 %. The global longitudinal strain is normal.  3. Right ventricular systolic function is normal. The right ventricular  size is normal.  4. The mitral valve is normal in structure. Mild mitral valve  regurgitation. No evidence of mitral stenosis.  5. The aortic valve is normal in structure. Aortic valve regurgitation is  not visualized. No aortic stenosis is present.  6. Aortic dilatation noted. There is mild dilatation at the level of the  sinuses of Valsalva measuring 41 mm.  7. The inferior vena cava is normal in size with greater than 50%  respiratory variability, suggesting right atrial pressure of 3 mmHg.    Neuro/Psych negative neurological ROS  negative psych ROS   GI/Hepatic GERD  ,(+) Cirrhosis  (s/p multiple paracentesis )  ascites  substance abuse (hx EtOH abuse)  alcohol use, Hepatitis -, CPlt: INR:    Endo/Other  negative  endocrine ROS  Renal/GU negative Renal ROS  negative genitourinary   Musculoskeletal negative musculoskeletal ROS (+)   Abdominal   Peds  Hematology negative hematology ROS (+)   Anesthesia Other Findings L inguinal hernia   Reproductive/Obstetrics negative OB ROS                            Anesthesia Physical Anesthesia Plan  ASA: III  Anesthesia Plan: General   Post-op Pain Management:    Induction: Intravenous, Rapid sequence and Cricoid pressure planned  PONV Risk Score and Plan: 2 and Ondansetron, Dexamethasone and Treatment may vary due to age or medical condition  Airway Management Planned: Oral ETT  Additional Equipment: None  Intra-op Plan:   Post-operative Plan: Extubation in OR  Informed Consent: I have reviewed the patients History and Physical, chart, labs and discussed the procedure including the risks, benefits and alternatives for the proposed anesthesia with the patient or authorized representative who has indicated his/her understanding and acceptance.     Dental advisory given and Interpreter used for interveiw  Plan Discussed with: CRNA  Anesthesia Plan Comments:         Anesthesia Quick Evaluation

## 2020-12-28 NOTE — Interval H&P Note (Signed)
History and Physical Interval Note: no change in H and P  12/28/2020 6:54 AM  Donald Aguilar  has presented today for surgery, with the diagnosis of left inguinal hernia.  The various methods of treatment have been discussed with the patient and family. After consideration of risks, benefits and other options for treatment, the patient has consented to  Procedure(s) with comments: LEFT INGUINAL HERNIA REPAIR WITH MESH (Left) - LMA as a surgical intervention.  The patient's history has been reviewed, patient examined, no change in status, stable for surgery.  I have reviewed the patient's chart and labs.  Questions were answered to the patient's satisfaction.     Coralie Keens

## 2020-12-28 NOTE — Congregational Nurse Program (Signed)
Picked up Tramadol from [facility] and delivered it to patient who just arrived home following Left Inguinal Hernia surgery.  With the assistance of interpreter [name] via phone we explained medication instructions and reviewed the discharge summary with him .He took a Tramadol at 10:00am and he was told the earliest he could take another one is 4:00 pm. Ice was applied to surgical site and he was told to continue using it today to help prevent swelling and bruising.  He agreed to call us if any problems arise.  Advised of follow-up appointment with Dr. Ninfa Linden on 01/25/21 at 11:20 am.  [name] RN, Congregational Nurse [phone]

## 2020-12-28 NOTE — Anesthesia Postprocedure Evaluation (Signed)
Anesthesia Post Note  Patient: Donald Aguilar  Procedure(s) Performed: LEFT INGUINAL HERNIA REPAIR WITH MESH (Left Inguinal)     Patient location during evaluation: PACU Anesthesia Type: General Level of consciousness: awake and alert, oriented and patient cooperative Pain management: pain level controlled Vital Signs Assessment: post-procedure vital signs reviewed and stable Respiratory status: spontaneous breathing, nonlabored ventilation and respiratory function stable Cardiovascular status: blood pressure returned to baseline and stable Postop Assessment: no apparent nausea or vomiting Anesthetic complications: no   No complications documented.  Last Vitals:  Vitals:   12/28/20 0845 12/28/20 0852  BP: 125/80 125/83  Pulse: 70 69  Resp: (!) 9 12  Temp:  36.5 C  SpO2: 97% 98%    Last Pain:  Vitals:   12/28/20 0629  TempSrc: Temporal  PainSc:                  [name]

## 2020-12-28 NOTE — Transfer of Care (Signed)
Immediate Anesthesia Transfer of Care Note  Patient: Donald Aguilar  Procedure(s) Performed: LEFT INGUINAL HERNIA REPAIR WITH MESH (Left Inguinal)  Patient Location: PACU  Anesthesia Type:General  Level of Consciousness: drowsy, patient cooperative and responds to stimulation  Airway & Oxygen Therapy: Patient Spontanous Breathing and Patient connected to nasal cannula oxygen  Post-op Assessment: Report given to RN, Post -op Vital signs reviewed and stable and Patient moving all extremities X 4  Post vital signs: Reviewed and stable  Last Vitals:  Vitals Value Taken Time  BP 138/90 12/28/20 0824  Temp    Pulse 79 12/28/20 0826  Resp 13 12/28/20 0826  SpO2 100 % 12/28/20 0826  Vitals shown include unvalidated device data.  Last Pain:  Vitals:   12/28/20 0629  TempSrc: Temporal  PainSc:          Complications: No complications documented.

## 2020-12-28 NOTE — Op Note (Signed)
LEFT INGUINAL HERNIA REPAIR WITH MESH  Procedure Note  Ja Ohman 12/28/2020   Pre-op Diagnosis: left inguinal hernia     Post-op Diagnosis: same  Procedure(s): LEFT INGUINAL HERNIA REPAIR WITH MESH  Surgeon(s): Coralie Keens, MD  Anesthesia: General  Staff:  Circulator: Sadie Haber, RN Scrub Person: Rometta Emery, RN  Estimated Blood Loss: Minimal               Findings: The patient was found to have an indirect left inguinal hernia.  It was repaired with a piece of large Prolene ProGrip mesh from [employer].  I opened up the hernia sac and he currently has no ascites.  Procedure: Patient brought to operating identifies correct patient.  He was placed upon the operating table and general anesthesia was induced.  His left lower quadrant was a prepped and draped in usual sterile fashion.  I made a longitudinal incision with a scalpel in the left inguinal area.  I then took this down through Scarpa's fascia with electrocautery.  The external oblique was then opened with internal and external rings.  The ilioinguinal nerve was identified and cauterized.  I then controlled the patient's testicular cord and hernia sac with a Penrose drain.  He had a very chronic large indirect hernia sac.  All contents have been reduced into the abdominal cavity.  I then was able to free the sac out from the cord structures.  I then opened the sac and all contents of been reduced back into the abdominal cavity.  There was no evidence of ascites from his cirrhosis.  I then tied off the base of the sac with the pursestring 2-0 silk suture.  I then excised the redundant sac with the cautery.  Next a piece of large Prolene ProGrip mesh was brought to the field.  I placed it against the pubic tubercle and brought around the cord structures covering the internal ring and inguinal floor.  I then sutured in place with 2 separate 2-0 Vicryl sutures.  I then closed the external beak fascia over the top of  the mesh with a running 2-0 Vicryl suture.  I anesthetized the fascia and subcutaneous tissue with Marcaine.  I performed an ilioinguinal nerve block with Marcaine as well.  Hemostasis peer to be achieved.  I then closed subtenons tissue with interrupted 3-0 Vicryl sutures and closed skin with running 4-0 Monocryl.  Dermabond was then applied.  Patient tolerated procedure well.  All the counts were correct at the end of the procedure.  The patient was then extubated in the operating room and taken in stable addition to the recovery room.          Coralie Keens   Date: 12/28/2020  Time: 8:19 AM

## 2020-12-28 NOTE — Discharge Instructions (Signed)
[facility] _______[facility]  UMBILICAL OR INGUINAL HERNIA REPAIR: POST OP INSTRUCTIONS  Always review your discharge instruction sheet given to you by the facility where your surgery was performed. IF YOU HAVE DISABILITY OR FAMILY LEAVE FORMS, YOU MUST BRING THEM TO THE OFFICE FOR PROCESSING.   DO NOT GIVE THEM TO YOUR DOCTOR.  1. A  prescription for pain medication may be given to you upon discharge.  Take your pain medication as prescribed, if needed.  If narcotic pain medicine is not needed, then you may take acetaminophen (Tylenol) or ibuprofen (Advil) as needed. 2. Take your usually prescribed medications unless otherwise directed. If you need a refill on your pain medication, please contact your pharmacy.  They will contact our office to request authorization. Prescriptions will not be filled after 5 pm or on week-ends. 3. You should follow a light diet the first 24 hours after arrival home, such as soup and crackers, etc.  Be sure to include lots of fluids daily.  Resume your normal diet the day after surgery. 4.Most patients will experience some swelling and bruising around the umbilicus or in the groin and scrotum.  Ice packs and reclining will help.  Swelling and bruising can take several days to resolve.  6. It is common to experience some constipation if taking pain medication after surgery.  Increasing fluid intake and taking a stool softener (such as Colace) will usually help or prevent this problem from occurring.  A mild laxative (Milk of Magnesia or Miralax) should be taken according to package directions if there are no bowel movements after 48 hours. 7. Unless discharge instructions indicate otherwise, you may remove your bandages 24-48 hours after surgery, and you may shower at that time.  You may have steri-strips (small skin tapes) in place directly over the incision.  These strips should be left on the skin for 7-10 days.  If your surgeon used skin glue on the  incision, you may shower in 24 hours.  The glue will flake off over the next 2-3 weeks.  Any sutures or staples will be removed at the office during your follow-up visit. 8. ACTIVITIES:  You may resume regular (light) daily activities beginning the next day--such as daily self-care, walking, climbing stairs--gradually increasing activities as tolerated.  You may have sexual intercourse when it is comfortable.  Refrain from any heavy lifting or straining until approved by your doctor.  a.You may drive when you are no longer taking prescription pain medication, you can comfortably wear a seatbelt, and you can safely maneuver your car and apply brakes. b.RETURN TO WORK:   _____________________________________________  9.You should see your doctor in the office for a follow-up appointment approximately 2-3 weeks after your surgery.  Make sure that you call for this appointment within a day or two after you arrive home to insure a convenient appointment time. 10.OTHER INSTRUCTIONS: _OK TO SHOWER STARTING TOMORROW ICE PACK, TYLENOL, AND IBUPROFEN ALSO FOR PAIN NO LIFTING MORE THAN 15 POUNDS FOR 4 WEEKS________________________    _____________________________________  WHEN TO CALL YOUR DOCTOR: 1. Fever over 101.0 2. Inability to urinate 3. Nausea and/or vomiting 4. Extreme swelling or bruising 5. Continued bleeding from incision. 6. Increased pain, redness, or drainage from the incision  The clinic staff is available to answer your questions during regular business hours.  Please don't hesitate to call and ask to speak to one of the nurses for clinical concerns.  If you have a medical emergency, go to the nearest   emergency room or call 911.  A surgeon from [facility] is always on call at the hospital   [address], [address], [place], [place]  [address] ?  [address] [address], [place], [place]   [address] [phone] ? [phone] ? FAX [phone] Web site:  [email or web address] 

## 2020-12-28 NOTE — Progress Notes (Signed)
Interpreter Leodis Liverpool to be called at [phone] once surgery is over and patient is in recovery.

## 2020-12-31 ENCOUNTER — Encounter (HOSPITAL_COMMUNITY): Payer: Self-pay | Admitting: Surgery

## 2021-01-02 NOTE — Congregational Nurse Program (Signed)
Home visit with interpreter [name].  Patient states he is doing well.  He only has mild pain now when walking but is comfortable when sitting and lying down.  No longer taking pain meds.  His primary complaint is itching on torso primarily abdomen, no worse at surgical site which per phone call from patient yesterday (without interpreter) CN misunderstood that the itching was at surgical site. Patient has tried hydrocortisone cream without relief.  Recommended he try Benadryl topically or orally if topical doesn't help.  Informed him verbally and in writing of March 23 appointment with Dr.Hassell to discuss possible treatment for his liver lesions/cancer.  Also told him about March 28 appointment with [facility]. (Helped him fill out paperwork for this appointment to which CN will accompany him).  Medicaid transportation scheduled for both appointments.  [name] RN, Congregational Nurse [phone]

## 2021-01-09 ENCOUNTER — Ambulatory Visit
Admission: RE | Admit: 2021-01-09 | Discharge: 2021-01-09 | Disposition: A | Discharge status: Home / Self Care | Payer: Medicare Other | Source: Ambulatory Visit | Attending: Gastroenterology | Admitting: Gastroenterology

## 2021-01-09 ENCOUNTER — Other Ambulatory Visit (HOSPITAL_COMMUNITY): Payer: Self-pay | Admitting: Interventional Radiology

## 2021-01-09 ENCOUNTER — Encounter: Payer: Self-pay | Admitting: *Deleted

## 2021-01-09 DIAGNOSIS — C220 Liver cell carcinoma: Secondary | ICD-10-CM

## 2021-01-09 DIAGNOSIS — K769 Liver disease, unspecified: Secondary | ICD-10-CM

## 2021-01-09 DIAGNOSIS — K7031 Alcoholic cirrhosis of liver with ascites: Secondary | ICD-10-CM

## 2021-01-09 HISTORY — PX: IR RADIOLOGIST EVAL & MGMT: IMG5224

## 2021-01-09 NOTE — Progress Notes (Signed)
Patient ID: Donald Aguilar, male   DOB: 06/26/1951, 69 y.o.   MRN: [id number]       Chief Complaint: Patient was seen in consultation today for left liver mass at the request of Danis,Henry L III  Referring Physician(s): Danis,Henry L III  History of Present Illness: Donald Aguilar is a 70 y.o. 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 03/02/2020 CTA obtained for preop planning of TIPS, also demonstrated enhancing regions in segment 4 and segment 8 of the liver, nonspecific  05/11/2020 patient went elective uncomplicated TIPS creation with large-volume paracentesis.  This significantly controlled his abdominal ascites.  He has   not needed anymore paracentesis procedures. 08/16/2020 follow-up CT shows enlarging 1.2 cm segment 4 liver lesion arterial enhancing LiRADS 5 presumed hepatocellular carcinoma, and 2 smaller lesions in segment 8. 11/23/2020 follow-up CT reveals 2 cm left liver lesion LiRAD 5;  smaller lesions in segment 3 and 8 with some motion degradation  12/25/2020 MR confirms 2.1 cm LiRADS 5 lesion in the left lobe.  The 2 smaller right lobe lesions are degraded by motion. 9/62/2297 had uncomplicated left inguinal hernia repair with mesh. Today additional history obtained from the patient with the aid of interpreter, and epic chart.   Challenging social situation with limited resources and health literacy.  He denies any symptoms of hepatic encephalopathy.   He does have some itching in his right upper abdomen topically. He is being assessed by [facility] for transplant.     Past Medical History:  Diagnosis Date  . 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 GERD (gastroesophageal reflux disease)   . Hepatitis C    Genotype 1a  . History of alcohol abuse   . Hypotension   . Hypotension     Past Surgical  History:  Procedure Laterality Date  . BIOPSY  09/13/2019   Procedure: BIOPSY;  Surgeon: Ladene Artist, MD;  Location: [facility] ENDOSCOPY;  Service: Endoscopy;;  . ESOPHAGOGASTRODUODENOSCOPY (EGD) WITH PROPOFOL N/A 09/13/2019   Procedure: ESOPHAGOGASTRODUODENOSCOPY (EGD) WITH PROPOFOL;  Surgeon: Ladene Artist, MD;  Location: [facility] ENDOSCOPY;  Service: Endoscopy;  Laterality: N/A;  . INGUINAL HERNIA REPAIR Left 12/28/2020   Procedure: LEFT INGUINAL HERNIA REPAIR WITH MESH;  Surgeon: Coralie Keens, MD;  Location: [place];  Service: General;  Laterality: Left;  LMA  . IR PARACENTESIS  09/12/2019  . IR PARACENTESIS  09/29/2019  . IR PARACENTESIS  10/19/2019  . IR PARACENTESIS  11/02/2019  . IR PARACENTESIS  11/10/2019  . IR PARACENTESIS  11/22/2019  . IR PARACENTESIS  12/06/2019  . IR PARACENTESIS  12/16/2019  . IR PARACENTESIS  12/23/2019  . IR PARACENTESIS  12/30/2019  . IR PARACENTESIS  01/06/2020  . IR PARACENTESIS  01/16/2020  . IR PARACENTESIS  02/01/2020  . IR PARACENTESIS  02/08/2020  . IR PARACENTESIS  02/28/2020  . IR PARACENTESIS  03/23/2020  . IR PARACENTESIS  04/17/2020  . IR PARACENTESIS  05/02/2020  . IR PARACENTESIS  05/11/2020  . IR PARACENTESIS  08/23/2020  . IR RADIOLOGIST EVAL & MGMT  03/20/2020  . IR RADIOLOGIST EVAL & MGMT  06/26/2020  . IR RADIOLOGIST EVAL & MGMT  01/09/2021  . IR TIPS  05/11/2020  . RADIOLOGY WITH ANESTHESIA N/A 05/11/2020   Procedure: IR WITH ANESTHESIA TRASJUGULAR INTRAHEPATIC PORTOSYSTEMIC SHUNT;  Surgeon: Arne Cleveland, MD;  Location:  [place] [facility];  Service: Radiology;  Laterality: N/A;    Allergies: Patient has no known allergies.  Medications: Prior to Admission medications   Medication Sig Start Date End Date Taking? Authorizing Provider  CONSTULOSE 10 GM/15ML solution TAKE 30 MLS BY MOUTH DAILY 12/25/20   Maudie Mercury, MD  furosemide (LASIX) 40 MG tablet Take 1 tablet (40 mg total) by mouth daily. 08/09/20   Mosetta Anis, MD  hydrocortisone cream 1 % Apply 1  application topically daily as needed for itching.    [provider]  potassium chloride SA (KLOR-CON) 20 MEQ tablet Take 2 tablets (40 mEq total) by mouth daily. 10/30/20   Doran Stabler, MD  Sofosbuvir-Velpatasvir (EPCLUSA) 400-100 MG TABS Take 1 tablet by mouth daily. 09/12/20   Thayer Headings, MD  spironolactone (ALDACTONE) 100 MG tablet Take 1 tablet (100 mg total) by mouth daily. 08/09/20   Mosetta Anis, MD  traMADol (ULTRAM) 50 MG tablet Take 1 tablet (50 mg total) by mouth every 6 (six) hours as needed for moderate pain or severe pain. 12/28/20   Coralie Keens, MD     Family History  Problem Relation Age of Onset  . Liver cancer Neg Hx   . Colon cancer Neg Hx   . Pancreatic cancer Neg Hx   . Esophageal cancer Neg Hx     Social History   Socioeconomic History  . Marital status: Single    Spouse name: Not on file  . Number of children: Not on file  . Years of education: Not on file  . Highest education level: Not on file  Occupational History  . Not on file  Tobacco Use  . Smoking status: Current Every Day Smoker    Packs/day: 0.30    Years: 50.00    Pack years: 15.00    Types: Cigarettes  . Smokeless tobacco: Never Used  . Tobacco comment: approximately a couple of packs a week  Vaping Use  . Vaping Use: Never used  Substance and Sexual Activity  . Alcohol use: Not Currently    Comment: last drank in September, 2020  . Drug use: Never  . Sexual activity: Not on file  Other Topics Concern  . Not on file  Social History Narrative   Leave with 3 friends   Smoke 2 cigarette  a day   Social Determinants of Health   Financial Resource Strain: Not on file  Food Insecurity: Not on file  Transportation Needs: Not on file  Physical Activity: Not on file  Stress: Not on file  Social Connections: Not on file    ECOG Status: 2 - Symptomatic, <50% confined to bed  Review of Systems: A 12 point ROS discussed and pertinent positives are indicated  in the HPI above.  All other systems are negative.  Review of Systems  Vital Signs: There were no vitals taken for this visit.  Physical Exam Constitutional: Oriented to person, place, and time. Thin . No distress.   HENT:  Head: Normocephalic and atraumatic.  Eyes: Conjunctivae and EOM are normal. Right eye exhibits no discharge. Left eye exhibits no discharge. No scleral icterus.  Neck: No JVD present.  Pulmonary/Chest: Effort normal. No stridor. No respiratory distress.  Abdomen: soft, non distended Neurological:  alert and oriented to person, place, and time.  Skin: Skin is warm and dry.  not diaphoretic.  Psychiatric:   normal mood and affect.   behavior is normal. Judgment and thought content normal.  Imaging: MR LIVER W WO CONTRAST  Result Date: 12/25/2020 CLINICAL DATA:  Cirrhosis with LI-RADS 5 lesion seen on previous CT. EXAM: MRI ABDOMEN WITHOUT AND WITH CONTRAST TECHNIQUE: Multiplanar multisequence MR imaging of the abdomen was performed both before and after the administration of intravenous contrast. CONTRAST:  34mL GADAVIST GADOBUTROL 1 MMOL/ML IV SOLN COMPARISON:  CT scan 11/23/2020. FINDINGS: This exam is markedly motion degraded. Assessment of anatomy on postcontrast imaging is largely nondiagnostic. Lower chest: Unremarkable. Hepatobiliary: Subtle nodularity of liver contour seen on previous CT is less discernible on today's motion degraded exam. The well-defined enhancing lesion in the left hepatic lobe seen on portal venous phase imaging of previous CT is perceptible today although assessment is markedly degraded by motion from breathing artifact and adjacent cardiac motion. There is a suggestion of restricted diffusion in this region on precontrast imaging. 2.1 cm lesion shows arterial phase diffuse hyperenhancement with washout and persistent enhancing capsule (45 second postcontrast image 25 of series 18). More delayed postcontrast imaging suffers from greater motion  degradation, but there does appear to be persistent washout (25/21). No focal lesion evident in segment III to correspond to the abnormality described on the previous CT. 10 mm focus of described washout in segment VIII, described as above the tips) shows no arterial phase hyperenhancement today, but given the substantial motion degradation, imaging may be nondiagnostic in this area. Gallbladder is distended without evidence of stones. No intra or extrahepatic biliary duct dilatation. Tips shunt again noted. Pancreas: No focal mass lesion. No dilatation of the main duct. No intraparenchymal cyst. No peripancreatic edema. Spleen:  No splenomegaly. No focal mass lesion. Adrenals/Urinary Tract: No adrenal nodule or mass. Tiny cortical T2 hyperintensities in both kidneys are too small to characterize but likely benign. Stomach/Bowel: Stomach is unremarkable. No gastric wall thickening. No evidence of outlet obstruction. Duodenum is normally positioned as is the ligament of Treitz. No small bowel or colonic dilatation within the visualized abdomen. Vascular/Lymphatic: No abdominal aortic aneurysm portal vein and superior mesenteric vein are patent. Splenic vein appears patent. Paraesophageal varices noted. There is no gastrohepatic or hepatoduodenal ligament lymphadenopathy. No retroperitoneal or mesenteric lymphadenopathy. Other:  No intraperitoneal free fluid. Musculoskeletal: No focal suspicious marrow enhancement within the visualized bony anatomy. IMPRESSION: 1. 2.1 cm focus of arterial phase hyperenhancement in the dome of segment II shows enhancing capsule and non peripheral washout, consistent with LI-RADS category 5 lesion and consistent with hepatocellular carcinoma. 2. Today's study is markedly motion degraded due to breathing motion and the other 2 areas of concern on previous CT are not clearly demonstrated by MRI today. Previous exam was not a multiphase liver CT and repeat multiphase hepatic CT may prove  helpful to further evaluate given better temporal resolution of CT imaging. 3. Paraesophageal varices suggest portal venous hypertension. Electronically Signed   By: [name] M.D.   On: 12/25/2020 13:05   IR Radiologist Eval & Mgmt  Result Date: 01/09/2021 Please refer to notes tab for details about interventional procedure. (Op Note)   Labs:  CBC: Recent Labs    05/01/20 0925 05/11/20 0634 09/03/20 1641 12/28/20 0631  WBC 7.5 5.9 7.9 8.3  HGB 12.9* 12.4* 11.9* 14.6  HCT 39.2 39.3 35.3* 42.7  PLT 231 193 161.0 181    COAGS: Recent Labs    04/24/20 0855 05/11/20 0634 09/03/20 1641 12/28/20 0718  INR 1.2 1.2 1.2* 1.3*    BMP: Recent Labs    05/11/20 4709 07/20/20 0925 08/09/20 1613 09/03/20 1641  10/03/20 1129 10/29/20 1612 12/18/20 1015  NA 137   < > 136 137 138 136 136  K 3.5   < > 3.6 3.6 3.1* 3.2* 4.3  CL 105   < > 101 101 103 102 103  CO2 24   < > 23 32 28 30 27   GLUCOSE 99   < > 93 88 113* 103* 77  BUN 11   < > 11 8 11 12 13   CALCIUM 8.4*   < > 8.3* 8.9 8.5* 9.3 9.2  CREATININE 0.81   < > 0.71* 0.81 0.90 1.02 0.88  GFRNONAA >60  --  97  --  88  --  88  GFRAA >60  --  112  --  102  --  102   < > = values in this interval not displayed.    LIVER FUNCTION TESTS: Recent Labs    07/30/20 1050 08/09/20 1613 09/03/20 1641 10/03/20 1129 10/29/20 1612 12/18/20 1015  BILITOT 2.1* 1.4* 1.9* 1.6* 1.2 1.6*  AST 54* 67* 46* 30 29 35  ALT 22 30 23 17 18 21   ALKPHOS 413* 432* 256*  --  76  --   PROT 7.8 7.4 7.8 7.0 7.7 7.6  ALBUMIN 2.8* 2.9* 2.9*  --  3.5  --     TUMOR MARKERS: Recent Labs    07/20/20 0925 10/29/20 1612  AFPTM 5.8 6.6*    Assessment and Plan:  Impression is that this patient has done clinically very well after TIPS creation for recurrent large volume abdominal ascites, and has not required follow-up paracentesis.  However, he has an enlarging probable hepatocellular carcinoma in the left lobe of his liver.  Surgical removal  would carry elevated risk with associated hepatocyte loss.  Transarterial approaches for palliation would also result in functional hepatocyte loss in this patient with limited reserve.  The lesion may be approachable percutaneously for ablation.  Due to its proximity to the pericardium immediately cephalad, I would avoid RF or microwave ablation but it would likely respond well to cryoablation with curative intent.  However, the potential additional lesion seen in the right lobe will require continued surveillance. I discussed with the patient (via translator) the imaging findings and he seemed to understand.  We did discuss treatment options including watchful waiting, surgical resection, arterial ablation including TACE and Y 90, and percutaneous thermal ablation techniques including cryo.  We talked about the anticipated benefits of ablation of the tumor and a small margin of normal liver with hopefully curative intent.  We discussed possible risks and complications including organ damage, infection, bleeding, degradation of liver function, hepatic encephalopathy, liver failure, death.  He seemed to understand and asked appropriate questions which were answered.  He is motivated to proceed as soon as possible with treatment of the tumor.  Accordingly, we can set him up for CT-guided cryoablation of the left liver lesion under anesthesia at [facility] as an outpatient with extended recovery,  At his convenience.  I would obtain  core biopsy sample during the procedure.  I recommend he  also proceed concurrently with his liver transplant work-up.   Thank you for this interesting consult.  I greatly enjoyed meeting Donald Aguilar and look forward to participating in their care.  A copy of this report was sent to the requesting provider on this date.  Electronically Signed: Rickard Rhymes 01/09/2021, 2:23 PM   I spent a total of    40 Minutes in face to face in clinical  consultation, greater than  50% of which was counseling/coordinating care for hepatocellular carcinoma.

## 2021-01-11 ENCOUNTER — Other Ambulatory Visit (HOSPITAL_COMMUNITY): Payer: Self-pay

## 2021-01-15 ENCOUNTER — Other Ambulatory Visit (HOSPITAL_COMMUNITY): Payer: Self-pay | Admitting: Interventional Radiology

## 2021-01-15 DIAGNOSIS — K769 Liver disease, unspecified: Secondary | ICD-10-CM

## 2021-01-15 DIAGNOSIS — C220 Liver cell carcinoma: Secondary | ICD-10-CM

## 2021-01-15 NOTE — Congregational Nurse Program (Signed)
Home visit to drop off benadryl cream per patient request for itching on right lower abdomen.  States he has been using it and it helped and was almost out.  Also out of Tramadol  but no longer having pain from surgery so advised him if he does have discomfort he can use tylenol.  [name] RN, Congregational Nurse [phone]

## 2021-01-20 ENCOUNTER — Other Ambulatory Visit (HOSPITAL_COMMUNITY): Payer: Self-pay

## 2021-01-20 MED FILL — Sofosbuvir-Velpatasvir Tab 400-100 MG: ORAL | 28 days supply | Qty: 28 | Fill #0 | Status: AC

## 2021-01-21 ENCOUNTER — Telehealth: Payer: Self-pay

## 2021-01-21 NOTE — Telephone Encounter (Signed)
Phone call to DSS to schedule Medicaid transportation to [facility] on 01/25/2021 and [facility] on 01/30/2021.  Also requested correction to spelling of first name on his Medicaid Card.  He will be mailed a new card.  [name] RN, Congregational Nurse [phone]

## 2021-01-22 ENCOUNTER — Other Ambulatory Visit (HOSPITAL_COMMUNITY): Payer: Self-pay

## 2021-01-23 ENCOUNTER — Other Ambulatory Visit (HOSPITAL_COMMUNITY): Payer: Self-pay

## 2021-01-23 NOTE — Congregational Nurse Program (Signed)
Home visit with interpreter [name].  Reminded patient of follow-up appointment with Dr. Rush Farmer on 01/25/2021.  Medicaid transportation will take him and CN will meet him there.  Also discussed appointments for liver ablation on 01/30/2021 (pre-op) and 01/30/2021 for procedure at [facility]  Explained type of surgery to be performed and that he will have to spend the night.  Returned call to [employer] to confirm medications patient is taking.  He was out of potassium today.  Refill called to [facility] and he will pick up tomorrow.  Also told him that a new Medicaid card has been requested with the correct spelling of his first name and he should receive it in about 2 weeks.  States he [place] this week without stopping and it is a long way from home.  [name] RN, Congregational Nurse [phone]

## 2021-01-25 NOTE — Congregational Nurse Program (Signed)
Accompanied patient to see Dr. Ninfa Linden at [facility] for postop check (left inguinal hernia).  Dr. Ninfa Linden stated he was pleased with his progress and in 1 week he can return to normal activities including lifting > 15 lbs.  Stated that occasional pain for next 6 mos. Is normal but he if has any concerns he should return.  He gave patient wipes to remove remaining glue at incision and instructed him to use them prior to showering.  [name] RN, Congregational Nurse [phone]

## 2021-01-29 NOTE — Patient Instructions (Addendum)
DUE TO COVID-19 ONLY ONE VISITOR IS ALLOWED TO COME WITH YOU AND STAY IN THE WAITING ROOM ONLY DURING PRE OP AND PROCEDURE DAY OF SURGERY. THE 2 VISITORS   MAY VISIT WITH YOU AFTER SURGERY IN YOUR PRIVATE ROOM DURING VISITING HOURS ONLY!  YOU NEED TO HAVE A COVID 19 TEST ON_4/18______ @_12 :10 pm______, THIS TEST MUST BE DONE BEFORE SURGERY,  COVID TESTING SITE [place] [address], IT IS ON THE RIGHT GOING OUT [address] APPROXIMATELY  2 MINUTES PAST [employer] ON THE RIGHT. ONCE YOUR COVID TEST IS COMPLETED,  PLEASE BEGIN THE QUARANTINE INSTRUCTIONS AS OUTLINED IN YOUR HANDOUT.                Donald Aguilar    Your procedure is scheduled on: 02/06/21   Report to [facility] Main  Entrance   Report to admitting at 10:15 AM     Call this number if you have problems the morning of surgery [phone]    Remember: Do not eat food or drink liquids :After Midnight.   BRUSH YOUR TEETH MORNING OF SURGERY AND RINSE YOUR MOUTH OUT, NO CHEWING GUM CANDY OR MINTS.     Take these medicines the morning of surgery with A SIP OF WATER: Epclusa                                 You may not have any metal on your body including               piercings  Do not wear jewelry,  lotions, powders or deodorant              Men may shave face and neck.   Do not bring valuables to the hospital. [place].  Contacts, dentures or bridgework may not be worn into surgery.    _____________________________________________________________________             [facility] - Preparing for Surgery Before surgery, you can play an important role.  Because skin is not sterile, your skin needs to be as free of germs as possible.  You can reduce the number of germs on your skin by washing with CHG (chlorahexidine gluconate) soap before surgery.  CHG is an antiseptic cleaner which kills germs and bonds with the skin to continue  killing germs even after washing. Please DO NOT use if you have an allergy to CHG or antibacterial soaps.  If your skin becomes reddened/irritated stop using the CHG and inform your nurse when you arrive at Short Stay. You may shave your face/neck. Please follow these instructions carefully:  1.  Shower with CHG Soap the night before surgery and the  morning of Surgery.  2.  If you choose to wash your hair, wash your hair first as usual with your  normal  shampoo.  3.  After you shampoo, rinse your hair and body thoroughly to remove the  shampoo.                                        4.  Use CHG as you would any other liquid soap.  You can apply chg directly  to the skin and wash  Gently with a scrungie or clean washcloth.  5.  Apply the CHG Soap to your body ONLY FROM THE NECK DOWN.   Do not use on face/ open                           Wound or open sores. Avoid contact with eyes, ears mouth and genitals (private parts).                       Wash face,  Genitals (private parts) with your normal soap.             6.  Wash thoroughly, paying special attention to the area where your surgery  will be performed.  7.  Thoroughly rinse your body with warm water from the neck down.  8.  DO NOT shower/wash with your normal soap after using and rinsing off  the CHG Soap.             9.  Pat yourself dry with a clean towel.            10.  Wear clean pajamas.            11.  Place clean sheets on your bed the night of your first shower and do not  sleep with pets. Day of Surgery : Do not apply any lotions/deodorants the morning of surgery.  Please wear clean clothes to the hospital/surgery center.  FAILURE TO FOLLOW THESE INSTRUCTIONS MAY RESULT IN THE CANCELLATION OF YOUR SURGERY PATIENT SIGNATURE_________________________________  NURSE SIGNATURE__________________________________  ________________________________________________________________________

## 2021-01-30 ENCOUNTER — Encounter (HOSPITAL_COMMUNITY)
Admission: RE | Admit: 2021-01-30 | Discharge: 2021-01-30 | Disposition: A | Discharge status: Home / Self Care | Payer: Medicare Other | Source: Ambulatory Visit | Attending: Interventional Radiology | Admitting: Interventional Radiology

## 2021-01-30 ENCOUNTER — Other Ambulatory Visit: Payer: Self-pay

## 2021-01-30 ENCOUNTER — Encounter (HOSPITAL_COMMUNITY): Payer: Self-pay

## 2021-01-30 DIAGNOSIS — Z01812 Encounter for preprocedural laboratory examination: Secondary | ICD-10-CM | POA: Diagnosis present

## 2021-01-30 HISTORY — DX: Myoneural disorder, unspecified: G70.9

## 2021-01-30 HISTORY — DX: Unspecified osteoarthritis, unspecified site: M19.90

## 2021-01-30 LAB — COMPREHENSIVE METABOLIC PANEL
ALT: 26 U/L (ref 0–44)
AST: 41 U/L (ref 15–41)
Albumin: 3.5 g/dL (ref 3.5–5.0)
Alkaline Phosphatase: 86 U/L (ref 38–126)
Anion gap: 8 (ref 5–15)
BUN: 12 mg/dL (ref 8–23)
CO2: 24 mmol/L (ref 22–32)
Calcium: 9.5 mg/dL (ref 8.9–10.3)
Chloride: 107 mmol/L (ref 98–111)
Creatinine, Ser: 0.94 mg/dL (ref 0.61–1.24)
GFR, Estimated: >60 mL/min (ref >60)
Glucose, Bld: 103 mg/dL — ABNORMAL HIGH (ref 70–99)
Potassium: 3.7 mmol/L (ref 3.5–5.1)
Sodium: 139 mmol/L (ref 135–145)
Total Bilirubin: 1 mg/dL (ref 0.3–1.2)
Total Protein: 7.4 g/dL (ref 6.5–8.1)

## 2021-01-30 LAB — CBC
HCT: 40.1 % (ref 39.0–52.0)
Hemoglobin: 13.5 g/dL (ref 13.0–17.0)
MCH: 32.9 pg (ref 26.0–34.0)
MCHC: 33.7 g/dL (ref 30.0–36.0)
MCV: 97.8 fL (ref 80.0–100.0)
Platelets: 168 10*3/uL (ref 150–400)
RBC: 4.1 MIL/uL — ABNORMAL LOW (ref 4.22–5.81)
RDW: 14.7 % (ref 11.5–15.5)
WBC: 8.5 10*3/uL (ref 4.0–10.5)
nRBC: 0 % (ref 0.0–0.2)

## 2021-01-30 NOTE — Progress Notes (Signed)
COVID Vaccine Completed:yes Date COVID Vaccine completed:Pt can't remember how many shots, the dates or the manufacturer. COVID vaccine manufacturer: [employer]   PCP - Curator - none  Chest x-ray - no EKG - no Stress Test - no ECHO - no Cardiac Cath - no Pacemaker/ICD device last checked:NA  Sleep Study - no CPAP -   Fasting Blood Sugar - NA Checks Blood Sugar _____ times a day  Blood Thinner Instructions:NA Aspirin Instructions: Last Dose:  Anesthesia review:   Patient denies shortness of breath, fever, cough and chest pain at PAT appointment yes  Patient verbalized understanding of instructions that were given to them at the PAT appointment. Patient was also instructed that they will need to review over the PAT instructions again at home before surgery.yes Pt has no report of SOB. PAT visit was done with an interpreter. The Pt has a person who is able to help him manage his Dr.'s visits outside of the hospital. Her name is Chrys Racer O'Brians. She will make sure he gets to the covid test and medicare transport to the hospital DOS.

## 2021-02-04 ENCOUNTER — Other Ambulatory Visit (HOSPITAL_COMMUNITY)
Admission: RE | Admit: 2021-02-04 | Discharge: 2021-02-04 | Disposition: A | Discharge status: Home / Self Care | Payer: Medicare Other | Source: Ambulatory Visit | Attending: Interventional Radiology | Admitting: Interventional Radiology

## 2021-02-04 DIAGNOSIS — Z20822 Contact with and (suspected) exposure to covid-19: Secondary | ICD-10-CM | POA: Diagnosis not present

## 2021-02-04 DIAGNOSIS — Z01812 Encounter for preprocedural laboratory examination: Secondary | ICD-10-CM | POA: Insufficient documentation

## 2021-02-05 ENCOUNTER — Other Ambulatory Visit: Payer: Self-pay | Admitting: Radiology

## 2021-02-05 LAB — SARS CORONAVIRUS 2 (TAT 6-24 HRS): SARS Coronavirus 2: NEGATIVE

## 2021-02-06 ENCOUNTER — Encounter (HOSPITAL_COMMUNITY)
Admission: RE | Disposition: A | Discharge status: Home / Self Care | Payer: Self-pay | Source: Ambulatory Visit | Attending: Interventional Radiology

## 2021-02-06 ENCOUNTER — Ambulatory Visit (HOSPITAL_COMMUNITY)
Admission: RE | Admit: 2021-02-06 | Discharge: 2021-02-06 | Disposition: A | Discharge status: Home / Self Care | Payer: Medicare Other | Source: Ambulatory Visit | Attending: Interventional Radiology | Admitting: Interventional Radiology

## 2021-02-06 ENCOUNTER — Ambulatory Visit (HOSPITAL_COMMUNITY): Payer: Medicare Other

## 2021-02-06 ENCOUNTER — Other Ambulatory Visit: Payer: Self-pay

## 2021-02-06 ENCOUNTER — Ambulatory Visit (HOSPITAL_COMMUNITY)
Admission: RE | Admit: 2021-02-06 | Discharge: 2021-02-07 | Disposition: A | Discharge status: Home / Self Care | Payer: Medicare Other | Source: Ambulatory Visit | Attending: Interventional Radiology | Admitting: Interventional Radiology

## 2021-02-06 ENCOUNTER — Encounter (HOSPITAL_COMMUNITY): Payer: Self-pay | Admitting: Interventional Radiology

## 2021-02-06 DIAGNOSIS — G709 Myoneural disorder, unspecified: Secondary | ICD-10-CM | POA: Diagnosis not present

## 2021-02-06 DIAGNOSIS — F17210 Nicotine dependence, cigarettes, uncomplicated: Secondary | ICD-10-CM | POA: Diagnosis not present

## 2021-02-06 DIAGNOSIS — Z79899 Other long term (current) drug therapy: Secondary | ICD-10-CM | POA: Insufficient documentation

## 2021-02-06 DIAGNOSIS — K219 Gastro-esophageal reflux disease without esophagitis: Secondary | ICD-10-CM | POA: Diagnosis not present

## 2021-02-06 DIAGNOSIS — Z01818 Encounter for other preprocedural examination: Secondary | ICD-10-CM

## 2021-02-06 DIAGNOSIS — B1920 Unspecified viral hepatitis C without hepatic coma: Secondary | ICD-10-CM | POA: Diagnosis not present

## 2021-02-06 DIAGNOSIS — C220 Liver cell carcinoma: Secondary | ICD-10-CM | POA: Diagnosis present

## 2021-02-06 DIAGNOSIS — K769 Liver disease, unspecified: Secondary | ICD-10-CM | POA: Insufficient documentation

## 2021-02-06 DIAGNOSIS — K7460 Unspecified cirrhosis of liver: Secondary | ICD-10-CM | POA: Insufficient documentation

## 2021-02-06 HISTORY — PX: RADIOLOGY WITH ANESTHESIA: SHX6223

## 2021-02-06 LAB — CBC WITH DIFFERENTIAL/PLATELET
Abs Immature Granulocytes: 0.02 10*3/uL (ref 0.00–0.07)
Basophils Absolute: 0.1 10*3/uL (ref 0.0–0.1)
Basophils Relative: 2 %
Eosinophils Absolute: 0.8 10*3/uL — ABNORMAL HIGH (ref 0.0–0.5)
Eosinophils Relative: 11 %
HCT: 43.4 % (ref 39.0–52.0)
Hemoglobin: 14.6 g/dL (ref 13.0–17.0)
Immature Granulocytes: 0 %
Lymphocytes Relative: 38 %
Lymphs Abs: 2.6 10*3/uL (ref 0.7–4.0)
MCH: 32.4 pg (ref 26.0–34.0)
MCHC: 33.6 g/dL (ref 30.0–36.0)
MCV: 96.2 fL (ref 80.0–100.0)
Monocytes Absolute: 0.7 10*3/uL (ref 0.1–1.0)
Monocytes Relative: 10 %
Neutro Abs: 2.7 10*3/uL (ref 1.7–7.7)
Neutrophils Relative %: 39 %
Platelets: 184 10*3/uL (ref 150–400)
RBC: 4.51 MIL/uL (ref 4.22–5.81)
RDW: 14.7 % (ref 11.5–15.5)
WBC: 6.9 10*3/uL (ref 4.0–10.5)
nRBC: 0 % (ref 0.0–0.2)

## 2021-02-06 LAB — COMPREHENSIVE METABOLIC PANEL
ALT: 30 U/L (ref 0–44)
AST: 46 U/L — ABNORMAL HIGH (ref 15–41)
Albumin: 3.7 g/dL (ref 3.5–5.0)
Alkaline Phosphatase: 85 U/L (ref 38–126)
Anion gap: 7 (ref 5–15)
BUN: 11 mg/dL (ref 8–23)
CO2: 23 mmol/L (ref 22–32)
Calcium: 9.4 mg/dL (ref 8.9–10.3)
Chloride: 107 mmol/L (ref 98–111)
Creatinine, Ser: 0.79 mg/dL (ref 0.61–1.24)
GFR, Estimated: >60 mL/min (ref >60)
Glucose, Bld: 106 mg/dL — ABNORMAL HIGH (ref 70–99)
Potassium: 4.1 mmol/L (ref 3.5–5.1)
Sodium: 137 mmol/L (ref 135–145)
Total Bilirubin: 1.6 mg/dL — ABNORMAL HIGH (ref 0.3–1.2)
Total Protein: 8 g/dL (ref 6.5–8.1)

## 2021-02-06 LAB — TYPE AND SCREEN
ABO/RH(D): A POS
Antibody Screen: NEGATIVE

## 2021-02-06 LAB — PROTIME-INR
INR: 1 (ref 0.8–1.2)
Prothrombin Time: 13.4 seconds (ref 11.4–15.2)

## 2021-02-06 IMAGING — CR DG CHEST 1V
1 series · 1 of 1 positions shown · non-contrast
Comparison: [DATE].

CLINICAL DATA: Preoperative exam for ablation.

EXAM:
CHEST  1 VIEW

[w chest pa]
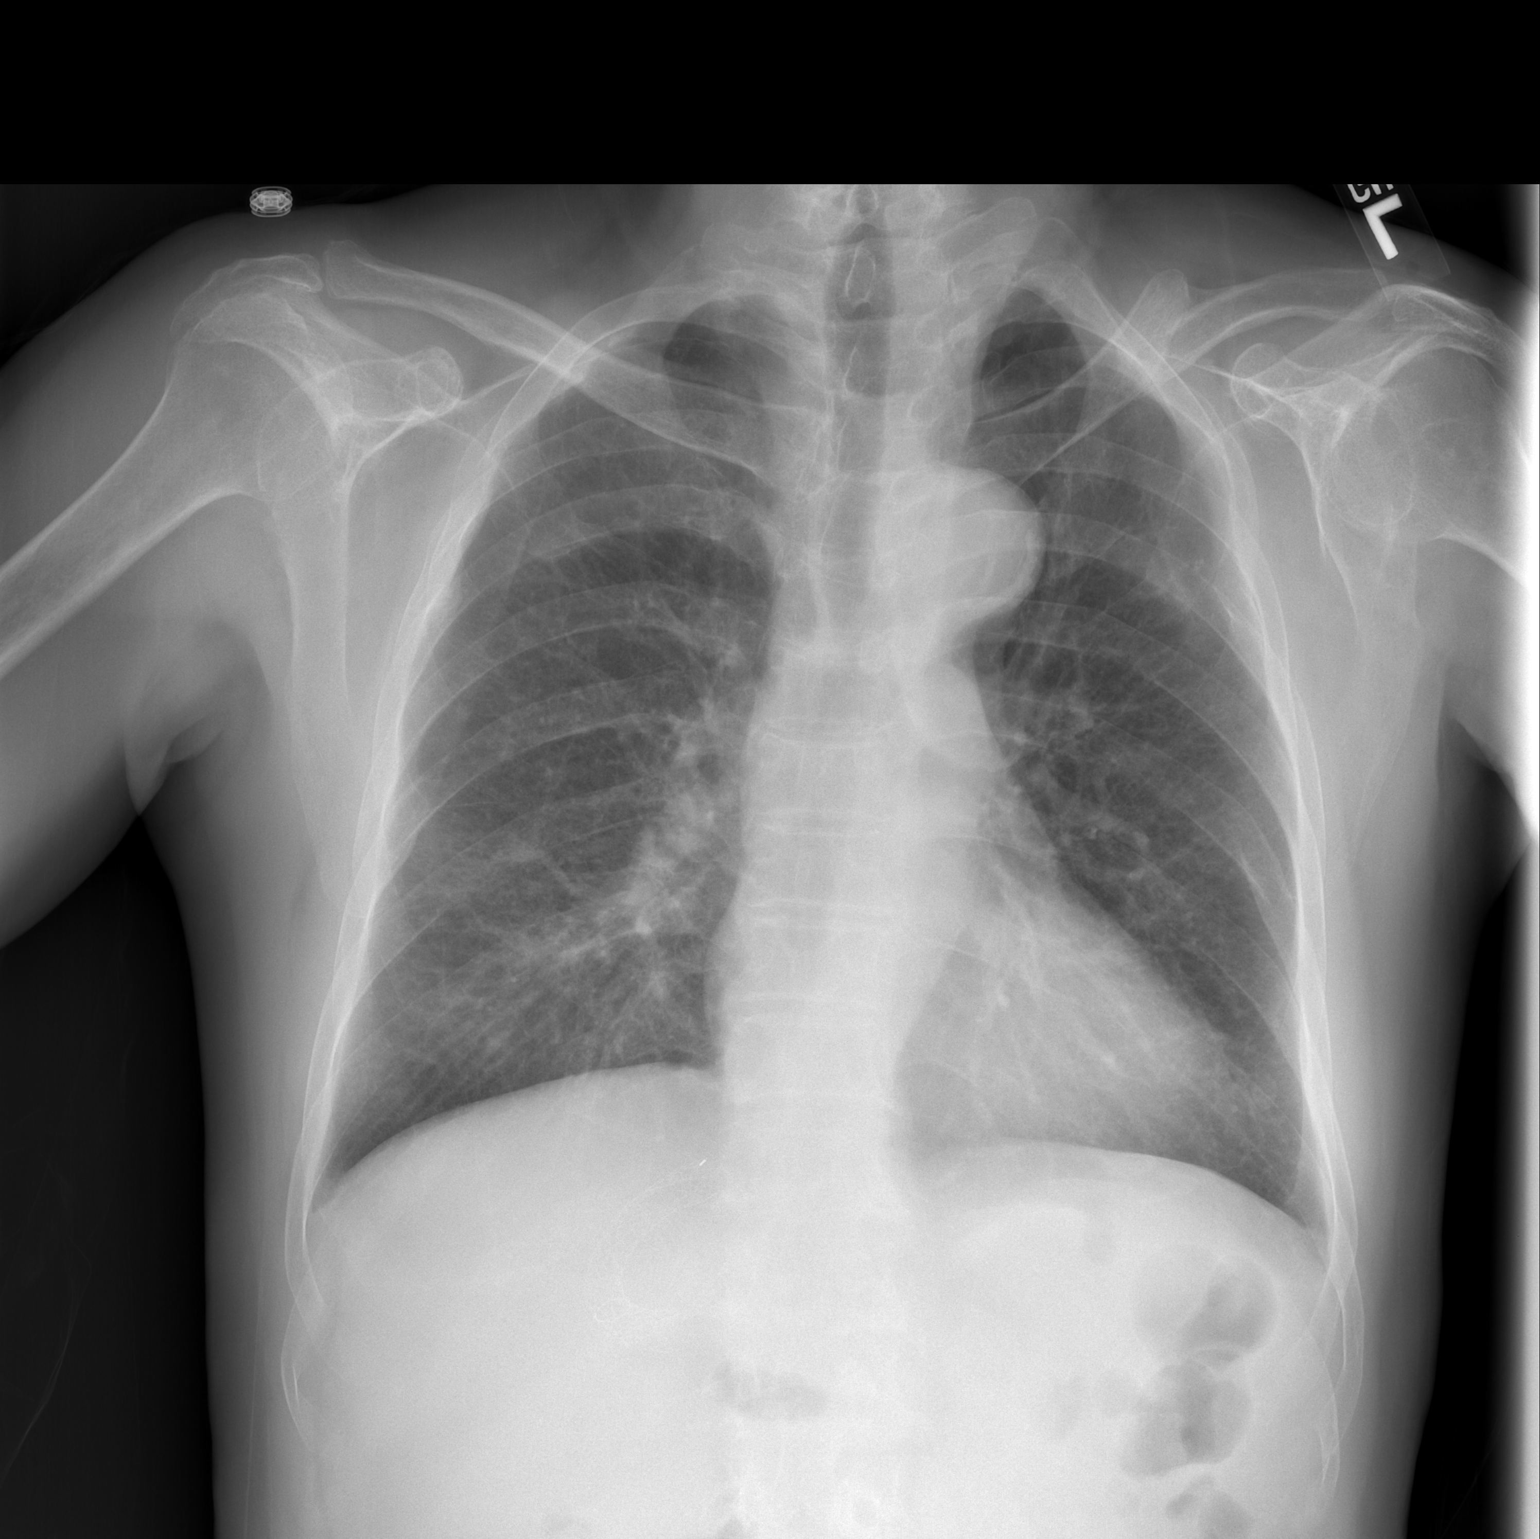

[1 of 1 positions shown; findings below may reference images not displayed]

FINDINGS: The heart size and mediastinal contours are within normal limits.
Both lungs are clear. Old left clavicular and bilateral rib
fractures are noted.
IMPRESSION: No active disease.

## 2021-02-06 IMAGING — CT CT BIOPSY
4 of 6 series · 13 of 32 positions shown, 18 images · non-contrast
Comparison: none

INDICATION: History of hepatitis, cirrhosis, status post tips creation to manage
recurrent large volume abdominal ascites. Interval development of
SCHLOSSER 5 lesion in the left lobe. the lesion is in proximity to the
left atrium.

[Series 2: i-spiral 5.0 bf37 · axial · 0.96mm/px · z∈[+1515,+1589]mm · 4 of 37 slices shown, 9 images (1 of 4)]
[im 8/37  soft-tissue]
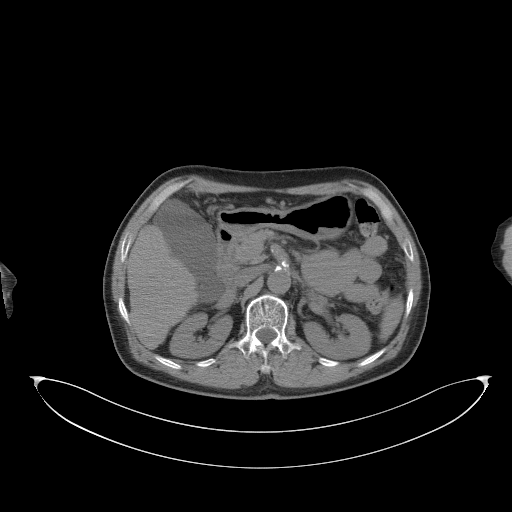
[im 8/37  lung]
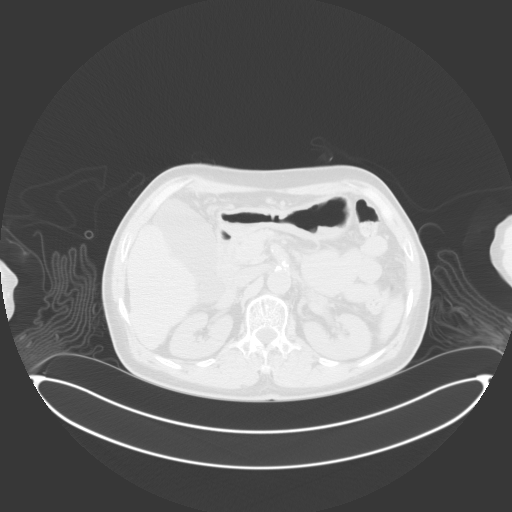
[im 8/37  bone]
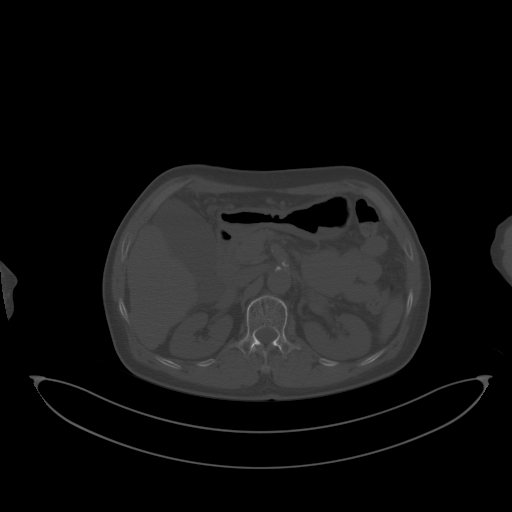
[im 15/37  soft-tissue]
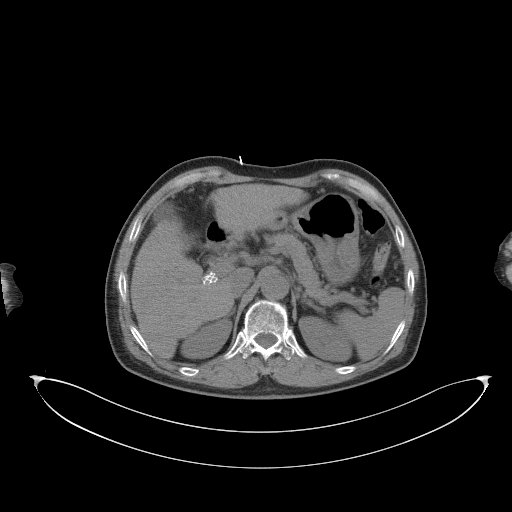
[im 15/37  lung]
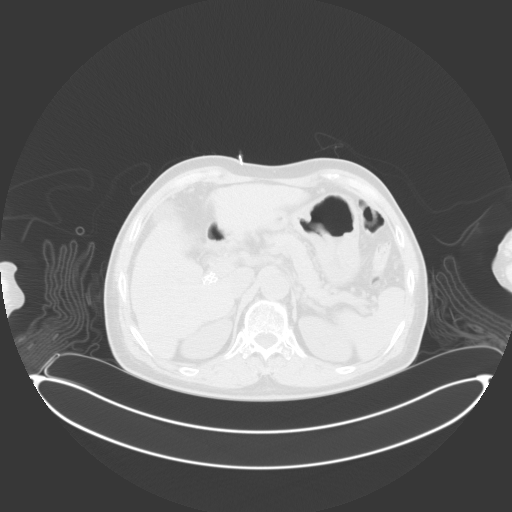
[im 22/37  soft-tissue]
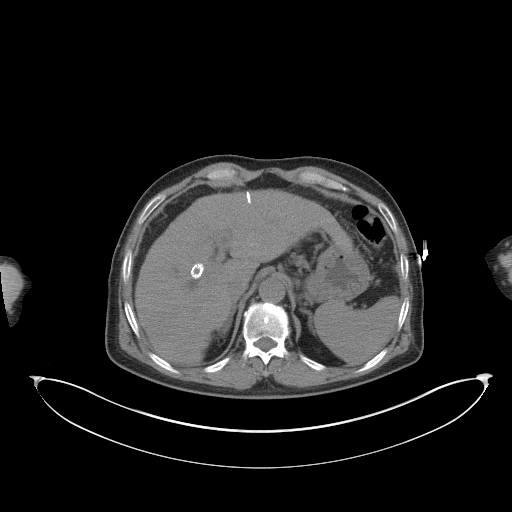
[im 22/37  lung]
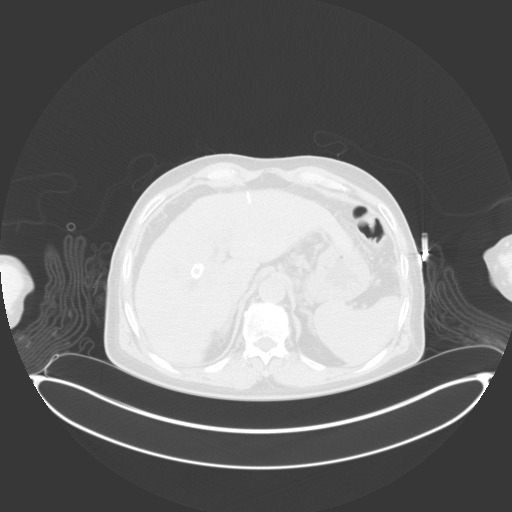
[im 29/37  soft-tissue]
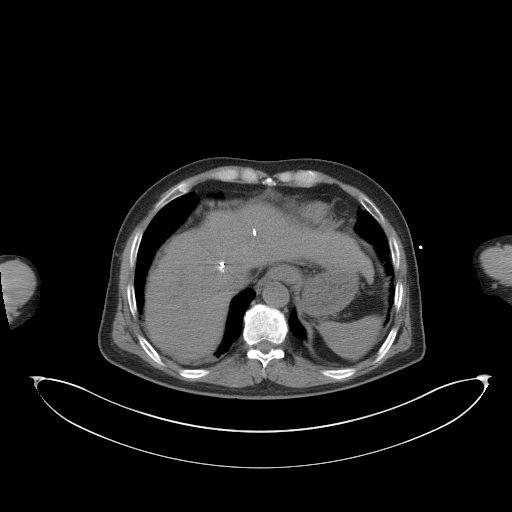
[im 29/37  lung]
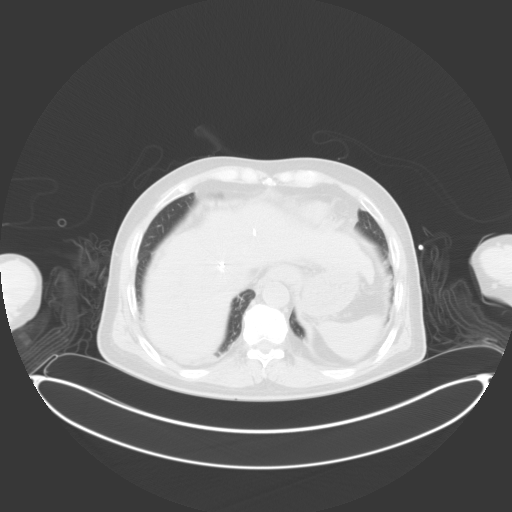

[Series 5: i-spiral 5.0 bf37 · axial · 0.96mm/px · z∈[+1515,+1589]mm · 4 of 37 slices shown (2 of 4)]
[im 8/37  soft-tissue]
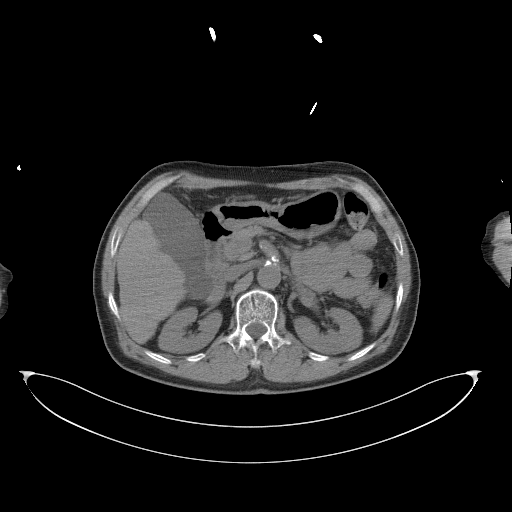
[im 15/37  soft-tissue]
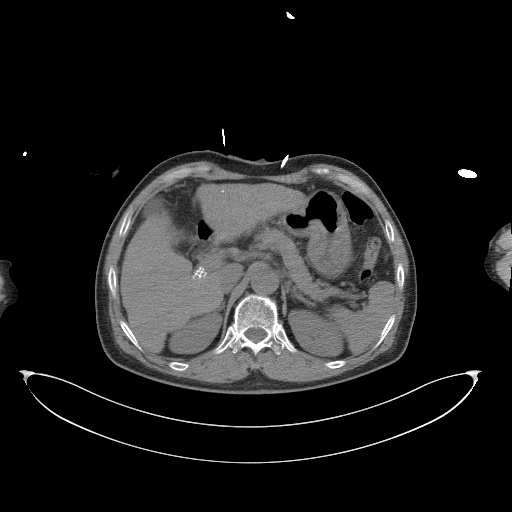
[im 22/37  soft-tissue]
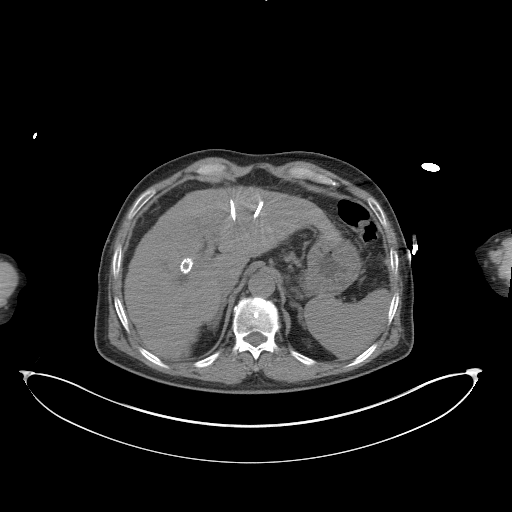
[im 29/37  soft-tissue]
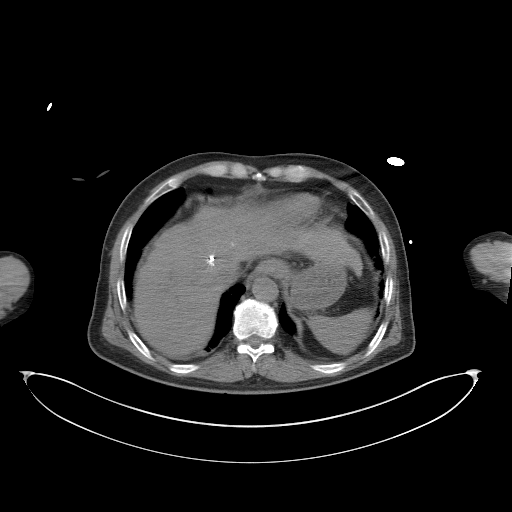

[Series 6: i-spiral 5.0 bf37 · axial · 0.96mm/px · z∈[+1522,+1585]mm · 3 of 37 slices shown (3 of 4)]
[im 10/37  soft-tissue]
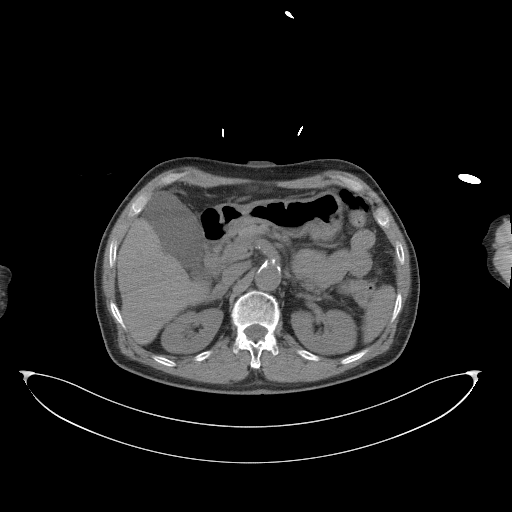
[im 19/37  soft-tissue]
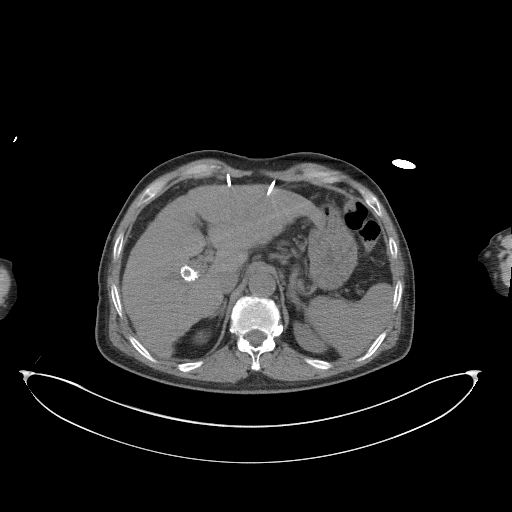
[im 28/37  soft-tissue]
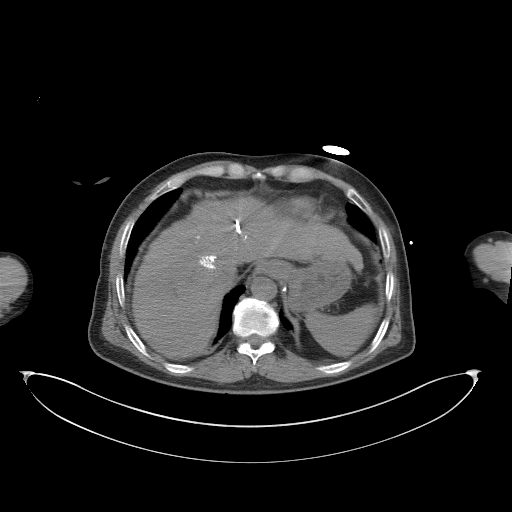

[Series 7: i-spiral 5.0 bf37 · axial · 0.96mm/px · z∈[+1522,+1554]mm · 2 of 37 slices shown (4 of 4)]
[im 10/37  soft-tissue]
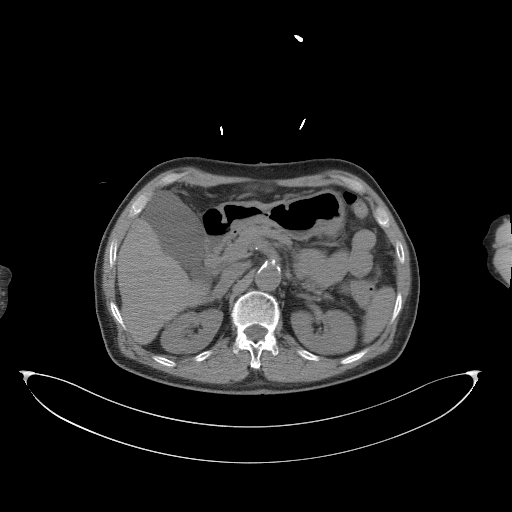
[im 19/37  soft-tissue]
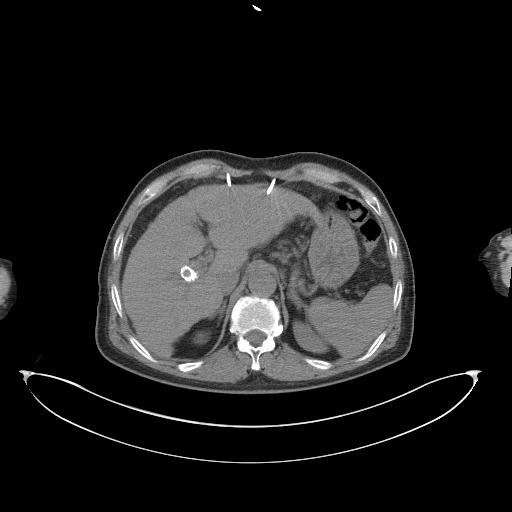

[13 of 32 positions shown; findings below may reference images not displayed]

EXAM:
CT-GUIDED PERCUTANEOUS CRYOABLATION OF LEFT HEPATIC MASS

CT-GUIDED CORE BIOPSY, LEFT HEPATIC MASS

ANESTHESIA/SEDATION:
General

MEDICATIONS:
2 g cefoxitin IV. The antibiotic was administered in an appropriate
time interval prior to needle puncture of the skin.

CONTRAST:  None required

PROCEDURE:
The procedure, risks, benefits, and alternatives were explained to
the patient, with the aid of interpreter. Questions regarding the
procedure were encouraged and answered. The patient understands and
consents to the procedure.

The patient was placed under general anesthesia. The left lobe
lesion could be localized under ultrasound. The patient was prepped
with chlorhexidine in a sterile fashion, and a sterile drape was
applied covering the operative field. A sterile gown and sterile
gloves were used for the procedure. A 22 gauge spinal needle was
advanced. Initial unenhanced CT was performed in a supine position
to localize the guide needle, and parenchymal calcifications.

Under ultrasound guidance, a percutaneous cryoablation probe was
advanced into the lesion from a right lateral approach. 2nd probe
was advanced into the lesion from a left lateral approach. Probe
positioning was confirmed and adjusted under intermittent CT prior
to cryoablation.

Prior to cryoablation, an 17 gauge trocar needle was advanced to the
margin of the lesion, and through this coaxial 18 gauge
representative core biopsy samples were obtained, submitted formalin
to surgical pathology. The guide needle was removed.

Cryoablation was performed through the 2 probes. Initial 10 minute
cycle of cryoablation was performed. CT at 8 minutes of cryoablation
showed satisfactory ice ball formation. This was followed by a 8
minute thaw cycle. A second 10 minute cycle of cryoablation was then
performed. After active thaw, the tract was cauterized and the 2
cryoablation probes were removed.

Post-procedural CT was performed. The patient tolerated the
procedure well.

COMPLICATIONS:
None immediate.
FINDINGS: The medial left lobe lesion was well seen on ultrasound, and a
localizing with a 22 gauge spinal needle under CT confirmed
concordance with the enhancing lesion seen on CT and MRI.
Technically successful cryoablation was performed using 2 probes.
Core biopsy samples of the lesion were also obtained and submitted
to surgical pathology.
IMPRESSION: CT guided percutaneous core biopsy and cryoablation of segment 4A
liver lesion. The patient will be observed overnight. Initial
follow-up will be performed in approximately 4 weeks.

## 2021-02-06 IMAGING — US CT GUIDANCE TISSUE ABLATION
1 series · 1 of 1 positions shown · non-contrast
Comparison: none

INDICATION: History of hepatitis, cirrhosis, status post tips creation to manage
recurrent large volume abdominal ascites. Interval development of
SCHLOSSER 5 lesion in the left lobe. the lesion is in proximity to the
left atrium.

[Series 1: ct guidance tissue ablation · 1 of 1 slices shown]
[im 1/1]
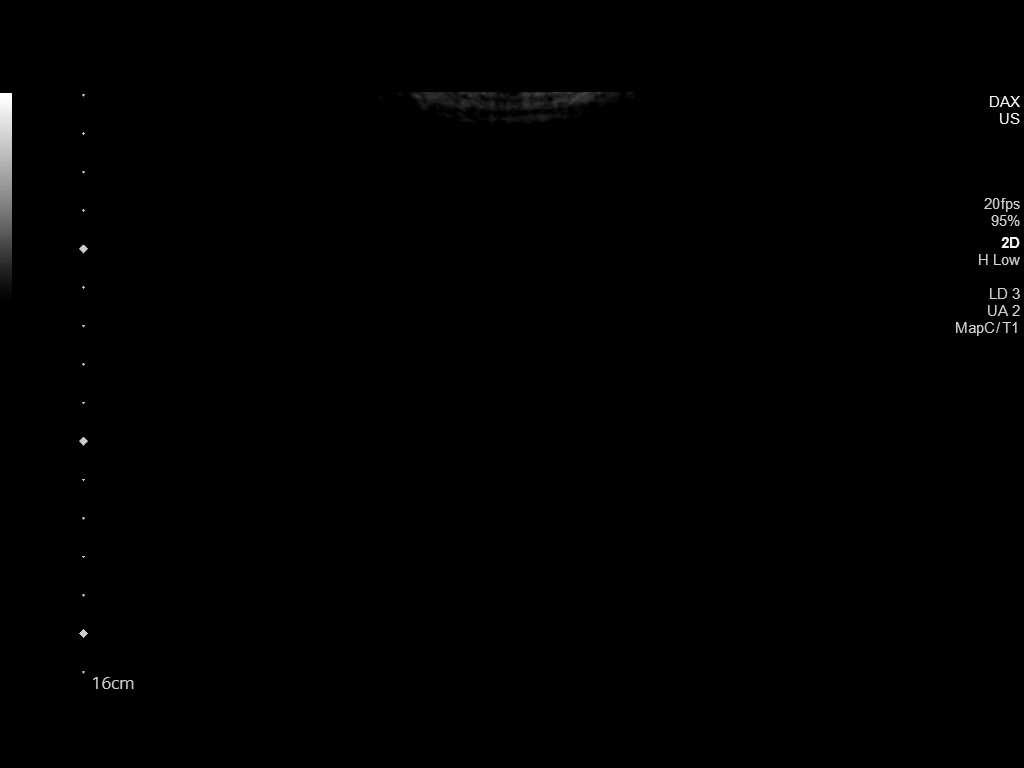

[1 of 1 positions shown; findings below may reference images not displayed]

EXAM:
CT-GUIDED PERCUTANEOUS CRYOABLATION OF LEFT HEPATIC MASS

CT-GUIDED CORE BIOPSY, LEFT HEPATIC MASS

ANESTHESIA/SEDATION:
General

MEDICATIONS:
2 g cefoxitin IV. The antibiotic was administered in an appropriate
time interval prior to needle puncture of the skin.

CONTRAST:  None required

PROCEDURE:
The procedure, risks, benefits, and alternatives were explained to
the patient, with the aid of interpreter. Questions regarding the
procedure were encouraged and answered. The patient understands and
consents to the procedure.

The patient was placed under general anesthesia. The left lobe
lesion could be localized under ultrasound. The patient was prepped
with chlorhexidine in a sterile fashion, and a sterile drape was
applied covering the operative field. A sterile gown and sterile
gloves were used for the procedure. A 22 gauge spinal needle was
advanced. Initial unenhanced CT was performed in a supine position
to localize the guide needle, and parenchymal calcifications.

Under ultrasound guidance, a percutaneous cryoablation probe was
advanced into the lesion from a right lateral approach. 2nd probe
was advanced into the lesion from a left lateral approach. Probe
positioning was confirmed and adjusted under intermittent CT prior
to cryoablation.

Prior to cryoablation, an 17 gauge trocar needle was advanced to the
margin of the lesion, and through this coaxial 18 gauge
representative core biopsy samples were obtained, submitted formalin
to surgical pathology. The guide needle was removed.

Cryoablation was performed through the 2 probes. Initial 10 minute
cycle of cryoablation was performed. CT at 8 minutes of cryoablation
showed satisfactory ice ball formation. This was followed by a 8
minute thaw cycle. A second 10 minute cycle of cryoablation was then
performed. After active thaw, the tract was cauterized and the 2
cryoablation probes were removed.

Post-procedural CT was performed. The patient tolerated the
procedure well.

COMPLICATIONS:
None immediate.
FINDINGS: The medial left lobe lesion was well seen on ultrasound, and a
localizing with a 22 gauge spinal needle under CT confirmed
concordance with the enhancing lesion seen on CT and MRI.
Technically successful cryoablation was performed using 2 probes.
Core biopsy samples of the lesion were also obtained and submitted
to surgical pathology.
IMPRESSION: CT guided percutaneous core biopsy and cryoablation of segment 4A
liver lesion. The patient will be observed overnight. Initial
follow-up will be performed in approximately 4 weeks.

## 2021-02-06 SURGERY — RADIOLOGY WITH ANESTHESIA
Anesthesia: General

## 2021-02-06 MED ORDER — CHLORHEXIDINE GLUCONATE 0.12 % MT SOLN
15.0000 mL | Freq: Once | OROMUCOSAL | Status: DC
  Filled 2021-02-06: qty 15

## 2021-02-06 MED ORDER — LACTATED RINGERS IV SOLN: INTRAVENOUS | Status: DC

## 2021-02-06 MED ORDER — ONDANSETRON HCL 4 MG/2ML IJ SOLN
INTRAMUSCULAR | Status: DC | PRN
  Administered 2021-02-06: 4 mg via INTRAVENOUS

## 2021-02-06 MED ORDER — SODIUM CHLORIDE 0.9 % IV SOLN
2.0000 g | Freq: Once | INTRAVENOUS | Status: AC
  Administered 2021-02-06: 2 g via INTRAVENOUS
  Filled 2021-02-06: qty 2

## 2021-02-06 MED ORDER — PROPOFOL 10 MG/ML IV BOLUS
INTRAVENOUS | Status: DC | PRN
  Administered 2021-02-06: 120 mg via INTRAVENOUS
  Administered 2021-02-06: 30 mg via INTRAVENOUS

## 2021-02-06 MED ORDER — ONDANSETRON HCL 4 MG/2ML IJ SOLN: 4.0000 mg | Freq: Once | INTRAMUSCULAR | Status: DC | PRN

## 2021-02-06 MED ORDER — PHENYLEPHRINE HCL-NACL 10-0.9 MG/250ML-% IV SOLN
INTRAVENOUS | Status: DC | PRN
  Administered 2021-02-06: 25 ug/min via INTRAVENOUS

## 2021-02-06 MED ORDER — FENTANYL CITRATE (PF) 100 MCG/2ML IJ SOLN
INTRAMUSCULAR | Status: DC | PRN
  Administered 2021-02-06 (×2): 50 ug via INTRAVENOUS
  Administered 2021-02-06 (×2): 25 ug via INTRAVENOUS

## 2021-02-06 MED ORDER — SUGAMMADEX SODIUM 200 MG/2ML IV SOLN
INTRAVENOUS | Status: DC | PRN
  Administered 2021-02-06: 150 mg via INTRAVENOUS

## 2021-02-06 MED ORDER — MIDAZOLAM HCL 5 MG/5ML IJ SOLN
INTRAMUSCULAR | Status: DC | PRN
  Administered 2021-02-06: 1 mg via INTRAVENOUS

## 2021-02-06 MED ORDER — MIDAZOLAM HCL 2 MG/2ML IJ SOLN
INTRAMUSCULAR | Status: AC
  Filled 2021-02-06: qty 2

## 2021-02-06 MED ORDER — FENTANYL CITRATE (PF) 100 MCG/2ML IJ SOLN: 25.0000 ug | INTRAMUSCULAR | Status: DC | PRN

## 2021-02-06 MED ORDER — ROCURONIUM BROMIDE 10 MG/ML (PF) SYRINGE
PREFILLED_SYRINGE | INTRAVENOUS | Status: DC | PRN
  Administered 2021-02-06: 60 mg via INTRAVENOUS

## 2021-02-06 MED ORDER — FENTANYL CITRATE (PF) 250 MCG/5ML IJ SOLN
INTRAMUSCULAR | Status: AC
  Filled 2021-02-06: qty 10

## 2021-02-06 MED ORDER — ORAL CARE MOUTH RINSE: 15.0000 mL | Freq: Once | OROMUCOSAL | Status: DC

## 2021-02-06 MED ORDER — LIDOCAINE 2% (20 MG/ML) 5 ML SYRINGE
INTRAMUSCULAR | Status: DC | PRN
  Administered 2021-02-06: 50 mg via INTRAVENOUS

## 2021-02-06 MED ORDER — HYDROCODONE-ACETAMINOPHEN 5-325 MG PO TABS: 1.0000 | ORAL_TABLET | ORAL | Status: DC | PRN

## 2021-02-06 NOTE — Progress Notes (Signed)
Patient in CT for a Cryo Liver Lesion Biopsy under the care of General Anesthesia.  Patient verified with two patient identifiers.

## 2021-02-06 NOTE — Anesthesia Postprocedure Evaluation (Signed)
Anesthesia Post Note  Patient: Donald Aguilar  Procedure(s) Performed: CT CRYO ABLATION (N/A )     Patient location during evaluation: PACU Anesthesia Type: General Level of consciousness: awake and alert and oriented Pain management: pain level controlled Vital Signs Assessment: post-procedure vital signs reviewed and stable Respiratory status: spontaneous breathing, nonlabored ventilation and respiratory function stable Cardiovascular status: blood pressure returned to baseline Postop Assessment: no apparent nausea or vomiting Anesthetic complications: yes Comments: Front middle tooth very loose preoperatively, fell out during extubation. Tooth not recovered but believed to be outside mouth. No cough, SOB, or indications of aspiration. Patient informed with assistance of interpreter.    No complications documented.  Last Vitals:  Vitals:   02/06/21 1630 02/06/21 1636  BP: 115/71   Pulse: 66 68  Resp: 17 15  Temp:    SpO2: 96% 95%    Last Pain:  Vitals:   02/06/21 1629  TempSrc:   PainSc: 0-No pain                 Brennan Bailey

## 2021-02-06 NOTE — H&P (Deleted)
  The note originally documented on this encounter has been moved the the encounter in which it belongs.  

## 2021-02-06 NOTE — H&P (Signed)
Chief Complaint: Patient was seen in consultation today for image guided liver lesion ablation with general anesthesia.  Referring Physician(s): Doran Stabler  Supervising Physician: Arne Cleveland  Patient Status: [facility] - Out-pt  History of Present Illness: Donald Aguilar is a 70 y.o. male with a past medical history significant for peripheral neuropathy, GERD, previous ETOH abuse, cirrhosis with recurrent ascites, hepatitis C and severe scrotal edema who presents today for an image guided liver lesion ablation with general anesthesia. Donald Aguilar is well known to IR due to requiring multiple paracentesis ultimately requiring TIPS creation on 05/11/20 with Dr. Vernard Gambles. He has been followed closely by our service since TIPS creation with regular imaging - CT on 08/16/20 showed enlarging 1.2 cm segment 4 liver lesion presumed to be [place] as well as 2 smaller lesion segment 8. Repeat CT on 11/23/20 shows 2 cm left liver lesion and smaller lesions in segment 3 and 8. MRI on 12/25/20 confirmed 2.1 cm LiRAD 5 lesion in the left lobe. He was seen by Dr. Vernard Gambles on 3/23 to discuss these findings and after thorough discussion the decision was made to proceed with image guided cryoablation of the left liver lesion with general anesthesia for which he presents today.  Donald Aguilar seen today with Dr. Vernard Gambles, [facility] translator at bedside. Donald Aguilar denies any complaints today, he understands the procedure and is agreeable to proceed as planned. He understands that he will be staying overnight for observation with plans to discharge home tomorrow - he is agreeable to this as well.  Past Medical History:  Diagnosis Date  . Arthritis    knees  . 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 GERD (gastroesophageal reflux disease)   . Hepatitis C    Genotype 1a  . History of alcohol abuse   . Hypotension   . Hypotension   . Neuromuscular disorder ([place])    peropheral neuropathy    Past Surgical  History:  Procedure Laterality Date  . BIOPSY  09/13/2019   Procedure: BIOPSY;  Surgeon: Ladene Artist, MD;  Location: [facility] ENDOSCOPY;  Service: Endoscopy;;  . ESOPHAGOGASTRODUODENOSCOPY (EGD) WITH PROPOFOL N/A 09/13/2019   Procedure: ESOPHAGOGASTRODUODENOSCOPY (EGD) WITH PROPOFOL;  Surgeon: Ladene Artist, MD;  Location: [facility] ENDOSCOPY;  Service: Endoscopy;  Laterality: N/A;  . INGUINAL HERNIA REPAIR Left 12/28/2020   Procedure: LEFT INGUINAL HERNIA REPAIR WITH MESH;  Surgeon: Coralie Keens, MD;  Location: [place];  Service: General;  Laterality: Left;  LMA  . IR PARACENTESIS  09/12/2019  . IR PARACENTESIS  09/29/2019  . IR PARACENTESIS  10/19/2019  . IR PARACENTESIS  11/02/2019  . IR PARACENTESIS  11/10/2019  . IR PARACENTESIS  11/22/2019  . IR PARACENTESIS  12/06/2019  . IR PARACENTESIS  12/16/2019  . IR PARACENTESIS  12/23/2019  . IR PARACENTESIS  12/30/2019  . IR PARACENTESIS  01/06/2020  . IR PARACENTESIS  01/16/2020  . IR PARACENTESIS  02/01/2020  . IR PARACENTESIS  02/08/2020  . IR PARACENTESIS  02/28/2020  . IR PARACENTESIS  03/23/2020  . IR PARACENTESIS  04/17/2020  . IR PARACENTESIS  05/02/2020  . IR PARACENTESIS  05/11/2020  . IR PARACENTESIS  08/23/2020  . IR RADIOLOGIST EVAL & MGMT  03/20/2020  . IR RADIOLOGIST EVAL & MGMT  06/26/2020  . IR RADIOLOGIST EVAL & MGMT  01/09/2021  . IR TIPS  05/11/2020  . RADIOLOGY WITH ANESTHESIA N/A 05/11/2020   Procedure: IR WITH ANESTHESIA TRASJUGULAR INTRAHEPATIC PORTOSYSTEMIC SHUNT;  Surgeon:  Arne Cleveland, MD;  Location: [place];  Service: Radiology;  Laterality: N/A;    Allergies: Patient has no known allergies.  Medications: Prior to Admission medications   Medication Sig Start Date End Date Taking? Authorizing Provider  CONSTULOSE 10 GM/15ML solution TAKE 30 MLS BY MOUTH DAILY Patient not taking: No sig reported 12/25/20   Maudie Mercury, MD  diphenhydrAMINE-zinc acetate (BENADRYL) cream Apply 1 application topically 3 (three) times daily as  needed for itching.    [provider]  furosemide (LASIX) 40 MG tablet Take 1 tablet (40 mg total) by mouth daily. 08/09/20   Mosetta Anis, MD  potassium chloride SA (KLOR-CON) 20 MEQ tablet Take 2 tablets (40 mEq total) by mouth daily. 10/30/20   Doran Stabler, MD  Sofosbuvir-Velpatasvir (EPCLUSA) 400-100 MG TABS Take 1 tablet by mouth daily. 09/12/20   Thayer Headings, MD  Sofosbuvir-Velpatasvir 400-100 MG TABS TAKE 1 TABLET BY MOUTH EVERY DAY Patient not taking: Reported on 01/23/2021 09/18/20 09/18/21  Thayer Headings, MD  spironolactone (ALDACTONE) 100 MG tablet Take 1 tablet (100 mg total) by mouth daily. 08/09/20   Mosetta Anis, MD  traMADol (ULTRAM) 50 MG tablet Take 1 tablet (50 mg total) by mouth every 6 (six) hours as needed for moderate pain or severe pain. Patient not taking: Reported on 01/23/2021 12/28/20   Coralie Keens, MD     Family History  Problem Relation Age of Onset  . Liver cancer Neg Hx   . Colon cancer Neg Hx   . Pancreatic cancer Neg Hx   . Esophageal cancer Neg Hx     Social History   Socioeconomic History  . Marital status: Single    Spouse name: Not on file  . Number of children: Not on file  . Years of education: Not on file  . Highest education level: Not on file  Occupational History  . Not on file  Tobacco Use  . Smoking status: Current Every Day Smoker    Packs/day: 0.30    Years: 50.00    Pack years: 15.00    Types: Cigarettes  . Smokeless tobacco: Never Used  . Tobacco comment: approximately a couple of packs a week  Vaping Use  . Vaping Use: Never used  Substance and Sexual Activity  . Alcohol use: Not Currently    Comment: last drank in September, 2020  . Drug use: Never  . Sexual activity: Not on file  Other Topics Concern  . Not on file  Social History Narrative   Leave with 3 friends   Smoke 2 cigarette  a day   Social Determinants of Health   Financial Resource Strain: Not on file  Food Insecurity: Not on  file  Transportation Needs: Not on file  Physical Activity: Not on file  Stress: Not on file  Social Connections: Not on file     Review of Systems: A 12 point ROS discussed and pertinent positives are indicated in the HPI above.  All other systems are negative.  Review of Systems  Constitutional: Negative for chills and fever.  Respiratory: Negative for cough and shortness of breath.   Cardiovascular: Negative for chest pain.  Gastrointestinal: Negative for abdominal distention, abdominal pain, diarrhea, nausea and vomiting.  Musculoskeletal: Negative for back pain.  Neurological: Negative for dizziness and headaches.    Vital Signs: There were no vitals taken for this visit.  Physical Exam Vitals reviewed.  Constitutional:      General: He is not  in acute distress. HENT:     Head: Normocephalic.  Eyes:     General: No scleral icterus. Cardiovascular:     Rate and Rhythm: Normal rate and regular rhythm.  Pulmonary:     Effort: Pulmonary effort is normal.     Breath sounds: Normal breath sounds.  Abdominal:     General: There is no distension.     Palpations: Abdomen is soft.     Tenderness: There is no abdominal tenderness.  Skin:    General: Skin is warm and dry.     Coloration: Skin is not jaundiced.  Neurological:     Mental Status: He is alert and oriented to person, place, and time.  Psychiatric:        Mood and Affect: Mood normal.        Behavior: Behavior normal.        Thought Content: Thought content normal.        Judgment: Judgment normal.      Imaging: IR Radiologist Eval & Mgmt  Result Date: 01/09/2021 Please refer to notes tab for details about interventional procedure. (Op Note)   Labs:  CBC: Recent Labs    09/03/20 1641 12/28/20 0631 01/30/21 1340 02/06/21 1021  WBC 7.9 8.3 8.5 6.9  HGB 11.9* 14.6 13.5 14.6  HCT 35.3* 42.7 40.1 43.4  PLT 161.0 181 168 184    COAGS: Recent Labs    05/11/20 0634 09/03/20 1641 12/28/20 0718  02/06/21 1021  INR 1.2 1.2* 1.3* 1.0    BMP: Recent Labs    05/11/20 0634 07/20/20 0925 08/09/20 1613 09/03/20 1641 10/03/20 1129 10/29/20 1612 12/18/20 1015 01/30/21 1340 02/06/21 1021  NA 137   < > 136   < > 138 136 136 139 137  K 3.5   < > 3.6   < > 3.1* 3.2* 4.3 3.7 4.1  CL 105   < > 101   < > 103 102 103 107 107  CO2 24   < > 23   < > 28 30 27 24 23   GLUCOSE 99   < > 93   < > 113* 103* 77 103* 106*  BUN 11   < > 11   < > 11 12 13 12 11   CALCIUM 8.4*   < > 8.3*   < > 8.5* 9.3 9.2 9.5 9.4  CREATININE 0.81   < > 0.71*   < > 0.90 1.02 0.88 0.94 0.79  GFRNONAA >60  --  97  --  88  --  88 >60 >60  GFRAA >60  --  112  --  102  --  102  --   --    < > = values in this interval not displayed.    LIVER FUNCTION TESTS: Recent Labs    09/03/20 1641 10/03/20 1129 10/29/20 1612 12/18/20 1015 01/30/21 1340 02/06/21 1021  BILITOT 1.9*   < > 1.2 1.6* 1.0 1.6*  AST 46*   < > 29 35 41 46*  ALT 23   < > 18 21 26 30   ALKPHOS 256*  --  76  --  86 85  PROT 7.8   < > 7.7 7.6 7.4 8.0  ALBUMIN 2.9*  --  3.5  --  3.5 3.7   < > = values in this interval not displayed.    TUMOR MARKERS: Recent Labs    07/20/20 0925 10/29/20 1612  AFPTM 5.8 6.6*    Assessment and Plan:  70 y/o  M with history of cirrhosis with recurrent ascites s/p TIPS creation 05/11/20 noted to have enlarging [place] in the left liver lobe who presents today for image guided cryoablation with general anesthesia as previously discussed with Dr. Vernard Gambles.  Patient has been NPO appropriately, no current anticoagulation/antiplatelet medications. Afebrile, WBC 6.9, hgb 13.5, plt 184, creatinine 0.79, INR 1.0. Patient to be admitted for overnight observation with plans for d/c home tomorrow.  Risks and benefits discussed with the patient including, but not limited to failure to treat entire lesion, bleeding, infection, damage to adjacent structures, decrease in renal function or post procedural neuropathy.  All of the  patient's questions were answered, patient is agreeable to proceed.  Consent signed and in chart.  Thank you for this interesting consult.  I greatly enjoyed meeting Cora Stetson and look forward to participating in their care.  A copy of this report was sent to the requesting provider on this date.  Electronically Signed: Joaquim Nam, [name] 02/06/2021, 11:28 AM   I spent a total of   25 Minutes in face to face in clinical consultation, greater than 50% of which was counseling/coordinating care for liver lesion cryoablation.

## 2021-02-06 NOTE — Transfer of Care (Signed)
Immediate Anesthesia Transfer of Care Note  Patient: Donald Aguilar  Procedure(s) Performed: CT CRYO ABLATION (N/A )  Patient Location: PACU  Anesthesia Type:General  Level of Consciousness: awake, alert , oriented and patient cooperative  Airway & Oxygen Therapy: Patient Spontanous Breathing and Patient connected to face mask oxygen  Post-op Assessment: Report given to RN, Post -op Vital signs reviewed and stable and Patient moving all extremities X 4  Post vital signs: stable  Last Vitals:  Vitals Value Taken Time  BP 114/77 02/06/21 1615  Temp    Pulse 70 02/06/21 1618  Resp 14 02/06/21 1618  SpO2 95 % 02/06/21 1618  Vitals shown include unvalidated device data.  Last Pain:  Vitals:   02/06/21 1558  TempSrc:   PainSc: 0-No pain         Complications: No complications documented.

## 2021-02-06 NOTE — Anesthesia Preprocedure Evaluation (Addendum)
Anesthesia Evaluation  Patient identified by MRN, date of birth, ID band Patient awake    Reviewed: Allergy & Precautions, NPO status , Patient's Chart, lab work & pertinent test results  Airway Mallampati: III  TM Distance: >3 FB Neck ROM: Full    Dental  (+) Poor Dentition, Dental Advisory Given, Loose,    Pulmonary Current Smoker and Patient abstained from smoking.,    Pulmonary exam normal breath sounds clear to auscultation       Cardiovascular negative cardio ROS Normal cardiovascular exam Rhythm:Regular Rate:Normal     Neuro/Psych  Neuromuscular disease negative psych ROS   GI/Hepatic PUD, GERD  Medicated and Controlled,(+) Cirrhosis   ascites    , Hepatitis -, C  Endo/Other  negative endocrine ROS  Renal/GU negative Renal ROS  negative genitourinary   Musculoskeletal  (+) Arthritis , Osteoarthritis,    Abdominal   Peds  Hematology negative hematology ROS (+)   Anesthesia Other Findings   Reproductive/Obstetrics                            Anesthesia Physical Anesthesia Plan  ASA: III  Anesthesia Plan: General   Post-op Pain Management:    Induction: Intravenous  PONV Risk Score and Plan: 3 and Treatment may vary due to age or medical condition, Ondansetron and Midazolam  Airway Management Planned: Oral ETT  Additional Equipment:   Intra-op Plan:   Post-operative Plan: Extubation in OR  Informed Consent: I have reviewed the patients History and Physical, chart, labs and discussed the procedure including the risks, benefits and alternatives for the proposed anesthesia with the patient or authorized representative who has indicated his/her understanding and acceptance.     Dental advisory given and Interpreter used for interveiw  Plan Discussed with: CRNA and Anesthesiologist  Anesthesia Plan Comments:        Anesthesia Quick Evaluation

## 2021-02-06 NOTE — Anesthesia Procedure Notes (Signed)
Procedure Name: Intubation Date/Time: 02/06/2021 1:52 PM Performed by: Lissa Morales, CRNA Pre-anesthesia Checklist: Patient identified, Emergency Drugs available, Suction available and Patient being monitored Patient Re-evaluated:Patient Re-evaluated prior to induction Oxygen Delivery Method: Circle system utilized Preoxygenation: Pre-oxygenation with 100% oxygen Induction Type: IV induction Ventilation: Mask ventilation without difficulty Laryngoscope Size: Mac and 4 Grade View: Grade II Tube type: Oral Tube size: 7.5 mm Number of attempts: 1 Airway Equipment and Method: Stylet and Oral airway Placement Confirmation: ETT inserted through vocal cords under direct vision,  positive ETCO2 and breath sounds checked- equal and bilateral Secured at: 21.5 cm Tube secured with: Tape Dental Injury: Teeth and Oropharynx as per pre-operative assessment

## 2021-02-07 ENCOUNTER — Encounter (HOSPITAL_COMMUNITY): Payer: Self-pay | Admitting: Interventional Radiology

## 2021-02-07 ENCOUNTER — Other Ambulatory Visit: Payer: Self-pay | Admitting: Physician Assistant

## 2021-02-07 DIAGNOSIS — Z1330 Encounter for screening examination for mental health and behavioral disorders, unspecified: Secondary | ICD-10-CM

## 2021-02-07 DIAGNOSIS — C220 Liver cell carcinoma: Secondary | ICD-10-CM | POA: Diagnosis not present

## 2021-02-07 DIAGNOSIS — Z9889 Other specified postprocedural states: Secondary | ICD-10-CM | POA: Diagnosis not present

## 2021-02-07 NOTE — Discharge Summary (Signed)
Patient ID: Donald Aguilar MRN: [id number] DOB/AGE: [birth year] 68 y.o.  Admit date: 02/06/2021 Discharge date: 02/07/2021  Supervising Physician: Mir, Sharen Heck  Patient Status: [facility] - In-pt  Admission Diagnoses:   Liver lesion  Discharge Diagnoses:  Active Problems:   Hepatocellular carcinoma [facility])   Discharged Condition: good  Hospital Course:   Donald Aguilar is a 70 y.o. male with a past medical history significant for peripheral neuropathy, GERD, previous ETOH abuse, cirrhosis with recurrent ascites, hepatitis C and severe scrotal edema.  He is well known to IR due to requiring multiple paracentesis ultimately requiring TIPS creation on 05/11/20 with Dr. Vernard Gambles.   He has been followed closely by our service since TIPS creation with regular imaging - CT on 08/16/20 showed enlarging 1.2 cm segment 4 liver lesion presumed to be [place] as well as 2 smaller lesion segment 8.   Repeat CT on 11/23/20 showed 2 cm left liver lesion and smaller lesions in segment 3 and 8.   MRI on 12/25/20 confirmed 2.1 cm LiRAD 5 lesion in the left lobe.   He was seen by Dr. Vernard Gambles on 3/23 to discuss these findings and he was deemed a good candidate for image guided cryoablation of the left liver lesion with general anesthesia.  He underwent the procedure yesterday and has done very well post operatively.  He is doing well, tolerating a diet, and he is ready for discharge home.  Consults: None  Treatments:   INDICATION: History of hepatitis, cirrhosis, status post tips creation to manage recurrent large volume abdominal ascites. Interval development of LI-RADS 5 lesion in the left lobe. the lesion is in proximity to the left atrium.  EXAM: CT-GUIDED PERCUTANEOUS CRYOABLATION OF LEFT HEPATIC MASS  CT-GUIDED CORE BIOPSY, LEFT HEPATIC MASS  ANESTHESIA/SEDATION: General  MEDICATIONS: 2 g cefoxitin IV. The antibiotic was administered in an appropriate time interval prior to needle  puncture of the skin.  CONTRAST:  None required  PROCEDURE: The procedure, risks, benefits, and alternatives were explained to the patient, with the aid of interpreter. Questions regarding the procedure were encouraged and answered. The patient understands and consents to the procedure.  The patient was placed under general anesthesia. The left lobe lesion could be localized under ultrasound. The patient was prepped with chlorhexidine in a sterile fashion, and a sterile drape was applied covering the operative field. A sterile gown and sterile gloves were used for the procedure. A 22 gauge spinal needle was advanced. Initial unenhanced CT was performed in a supine position to localize the guide needle, and parenchymal calcifications.  Under ultrasound guidance, a percutaneous cryoablation probe was advanced into the lesion from a right lateral approach. 2nd probe was advanced into the lesion from a left lateral approach. Probe positioning was confirmed and adjusted under intermittent CT prior to cryoablation.  Prior to cryoablation, an 7 gauge trocar needle was advanced to the margin of the lesion, and through this coaxial 18 gauge representative core biopsy samples were obtained, submitted formalin to surgical pathology. The guide needle was removed.  Cryoablation was performed through the 2 probes. Initial 10 minute cycle of cryoablation was performed. CT at 8 minutes of cryoablation showed satisfactory ice ball formation. This was followed by a 8 minute thaw cycle. A second 10 minute cycle of cryoablation was then performed. After active thaw, the tract was cauterized and the 2 cryoablation probes were removed.  Post-procedural CT was performed. The patient tolerated the procedure well.  COMPLICATIONS: None immediate.  FINDINGS: The medial  left lobe lesion was well seen on ultrasound, and a localizing with a 22 gauge spinal needle under CT  confirmed concordance with the enhancing lesion seen on CT and MRI. Technically successful cryoablation was performed using 2 probes. Core biopsy samples of the lesion were also obtained and submitted to surgical pathology.  IMPRESSION: CT guided percutaneous core biopsy and cryoablation of segment 4A liver lesion. The patient will be observed overnight. Initial follow-up will be performed in approximately 4 weeks.   Electronically Signed   By: Lucrezia Europe M.D.   On: 02/06/2021 16:39   Discharge Exam: Blood pressure 92/61, pulse 80, temperature 98.3 F (36.8 C), temperature source Oral, resp. rate 16, height 5\' 3"  (1.6 m), weight 61.7 kg, SpO2 94 %. Awake and alert No complaints RRR No respiratory issues Abdominal access site looks good.  Disposition: Discharge disposition: 01-Home or Self Care     IR clinic will call patient with follow up appointment details.  Discharge Instructions    Call MD for:  persistant nausea and vomiting   Complete by: As directed    Call MD for:  redness, tenderness, or signs of infection (pain, swelling, redness, odor or green/yellow discharge around incision site)   Complete by: As directed    Call MD for:  severe uncontrolled pain   Complete by: As directed    Call MD for:  temperature >100.4   Complete by: As directed    Diet - low sodium heart healthy   Complete by: As directed    Increase activity slowly   Complete by: As directed    No dressing needed   Complete by: As directed      Allergies as of 02/07/2021   No Known Allergies     Medication List    STOP taking these medications   Constulose 10 GM/15ML solution Generic drug: lactulose   traMADol 50 MG tablet Commonly known as: ULTRAM     TAKE these medications   diphenhydrAMINE-zinc acetate cream Commonly known as: BENADRYL Apply 1 application topically 3 (three) times daily as needed for itching.   furosemide 40 MG tablet Commonly known as: Lasix Take 1  tablet (40 mg total) by mouth daily.   potassium chloride SA 20 MEQ tablet Commonly known as: KLOR-CON Take 2 tablets (40 mEq total) by mouth daily.   Sofosbuvir-Velpatasvir 400-100 MG Tabs Commonly known as: Epclusa Take 1 tablet by mouth daily. What changed: Another medication with the same name was removed. Continue taking this medication, and follow the directions you see here.   spironolactone 100 MG tablet Commonly known as: Aldactone Take 1 tablet (100 mg total) by mouth daily.            Discharge Care Instructions  (From admission, onward)         Start     Ordered   02/07/21 0000  No dressing needed        02/07/21 7867            Electronically Signed: Murrell Redden, [name] 02/07/2021, 9:57 AM     I have spent Less Than 30 Minutes discharging Donald Aguilar.

## 2021-02-07 NOTE — Consult Note (Signed)
Reason for admission:  Donald Aguilar a 70 y.o.malewith a past medical history significant for peripheral neuropathy, GERD, previous ETOH abuse, cirrhosis with recurrent ascites, hepatitis C and severe scrotal edema. He is well known to IR due to requiring multiple paracentesis ultimately requiring TIPS creation on 05/11/20 with Dr. Vernard Gambles. He has been followed closely by our service since TIPS creation with regular imaging - CT on 08/16/20 showed enlarging 1.2 cm segment 4 liver lesion presumed to be [place] as well as 2 smaller lesion segment 8. MRI on 12/25/20 confirmed 2.1 cm LiRAD 5 lesion in the left lobe. He was seen by Dr. Vernard Gambles on 3/23 to discuss these findings and he was deemed a good candidate for image guided cryoablation of the left liver lesion with general anesthesia.He underwent the procedure yesterday and has done very well post operatively.He is doing well, tolerating a diet, and he is ready for discharge home.  Psych consult place for epic recommended due to suicide risk.  Patient is seen and evaluated face-to-face by this nurse practitioner.  He is observed to be sitting upright, in his bed awaiting for discharge.  He is able to verbalize that he was overwhelmed yesterday, due to his procedure.  However today he states he is doing much better.  He denies any previous or current diagnosis of depression, anxiety, bipolar, and or schizophrenia.  He does admit to difficulty adjusting to his most recent diagnosis, and is interested in therapy.  He denies any previous attempts to self-harm, suicidal thoughts, and or suicidal intentions.  He denies any family history of mental illness, and or family history of suicide completion.   On evaluation patient is alert and oriented, calm and cooperative, very pleasant upon approach. Patient denies any access to weapons, denies any alcohol and or substance abuse.  He reports moderate sleep and fair appetite.   Patient denies any auditory and/or visual  hallucinations, does not appear to be responding to internal or external stimuli.  There is no evidence of delusional thought content and patient appears to answer all questions appropriately.  At this time patient appears to be stable to discharge home, with support system services in place.    -Recommend social work to assist with outpatient referral for behavioral health.  Will update discharge AVS to reflect recommendations. -Patient does not meet inpatient criteria. -Psychiatry to sign off at this time.

## 2021-02-08 ENCOUNTER — Telehealth: Payer: Self-pay

## 2021-02-08 LAB — SURGICAL PATHOLOGY

## 2021-02-08 NOTE — Telephone Encounter (Signed)
Phone call to patient to see how he is feeling following 02/06/2021 procedure.  He states he is feeling good and has gone to the bathroom.  Denies pain, nausea or vomiting.Only complaint was itching at site of incision.   Unable to get more detailed information without interpreter.  Advised him of follow-up appointment with Dr. Vernard Gambles on  03/06/2021 at 10:00 am.  Will call if he has concerns.  [name] RN, congregational Nurse [phone]

## 2021-02-18 ENCOUNTER — Other Ambulatory Visit: Payer: Self-pay | Admitting: Gastroenterology

## 2021-02-18 ENCOUNTER — Other Ambulatory Visit (HOSPITAL_COMMUNITY): Payer: Self-pay

## 2021-02-20 NOTE — Congregational Nurse Program (Signed)
Home visit with interpreter [name].  Informed patient that Dr. Henreitta Leber office called and stated that once he finishes current bottle of Epclusa that he will no longer need to take this medication because the 6 month course will be completed.  We reminded him of follow-up appointment with Dr. Vernard Gambles on 03/06/2021 at 9:45 am. Referred him to [facility] for Covid booster on 02/25/2021.  [name] RN, Congregational Nurse [phone]

## 2021-02-27 NOTE — Congregational Nurse Program (Signed)
Home visit with interpreter [name].  Reminded patient of follow-up appointment with Dr. Vernard Gambles on 03/06/2021 at 9:45 am.  CN will arrange Medicaid transportation. Stated that he received Covid-19 booster on 02/25/2021 at [facility].  He received letter from [facility] regarding applying for food stamps.  We called and completed an application and were told that once they receive all necessary documents patient should qualify for $123 per month benefits.  He will receive information via mail in about 1 week to verify today's application and instructions for documents needed to finish application.  [name] RN, Congregational Nurse [phone]

## 2021-03-05 ENCOUNTER — Telehealth: Payer: Self-pay

## 2021-03-05 NOTE — Telephone Encounter (Signed)
Phone call to patient to advise of pick up time for tomorrow's appointment with Dr. Vernard Gambles.  Patient stated his belly is not good and has water.  Upon questioning says he is only taking 1 medication but unable to determine what it is.  Told him to take it with him to appointment.  [name] RN, Congregational Nurse [phone]

## 2021-03-06 ENCOUNTER — Encounter: Payer: Self-pay | Admitting: *Deleted

## 2021-03-06 ENCOUNTER — Other Ambulatory Visit (HOSPITAL_COMMUNITY): Payer: Self-pay

## 2021-03-06 ENCOUNTER — Ambulatory Visit
Admission: RE | Admit: 2021-03-06 | Discharge: 2021-03-06 | Disposition: A | Discharge status: Home / Self Care | Payer: Medicare Other | Source: Ambulatory Visit | Attending: Physician Assistant | Admitting: Physician Assistant

## 2021-03-06 ENCOUNTER — Other Ambulatory Visit: Payer: Self-pay | Admitting: Interventional Radiology

## 2021-03-06 ENCOUNTER — Other Ambulatory Visit: Payer: Self-pay | Admitting: *Deleted

## 2021-03-06 DIAGNOSIS — C220 Liver cell carcinoma: Secondary | ICD-10-CM

## 2021-03-06 HISTORY — PX: IR RADIOLOGIST EVAL & MGMT: IMG5224

## 2021-03-06 NOTE — Progress Notes (Signed)
Patient ID: Donald Aguilar, male   DOB: 04-16-1951, 70 y.o.   MRN: [id number]       Chief Complaint: Patient was seen in consultation today for follow-up post liver cryoablation at the request of Ardis Rowan  Referring Physician(s): Danis,Henry L III    History of Present Illness: Donald Aguilar is a 70 y.o. 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 03/02/2020 CTA obtained for preop planning of TIPS, also demonstrated enhancing regions in segment 4 and segment 8 of the liver, nonspecific  05/11/2020 patient went elective uncomplicated TIPS creation with large-volume paracentesis.  This significantly controlled his abdominal ascites.  He has   not needed anymore paracentesis procedures. 08/16/2020 follow-up CT shows enlarging 1.2 cm segment 4 liver lesion arterial enhancing LiRADS 5 presumed hepatocellular carcinoma, and 2 smaller lesions in segment 8. 11/23/2020 follow-up CT reveals 2 cm left liver lesion LiRAD 5;  smaller lesions in segment 3 and 8 with some motion degradation   12/25/2020 MR confirms 2.1 cm LiRADS 5 lesion in the left lobe.  The 2 smaller right lobe lesions are degraded by motion. 9/56/2130 had uncomplicated left inguinal hernia repair with mesh. He is being assessed by [facility] for transplant. 02/06/2021 CT-guided core biopsy and cryoablation of medial left hepatic lobe lesion.  Surgical pathology positive for well-to moderately differentiated hepatocellular carcinoma.  He presents today for follow-up. Today additional history obtained from the patient with the aid of interpreter, and epic chart.   Challenging social situation with limited resources and health literacy.  He denies any symptoms of hepatic encephalopathy.     He has some pain in his right mid abdomen which she relates to previous  thoracentesis procedure.  He is eating well.  No scrotal swelling.        Past Medical History:  Diagnosis Date  . Arthritis    knees  . 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 GERD (gastroesophageal reflux disease)   . Hepatitis C    Genotype 1a  . History of alcohol abuse   . Hypotension   . Hypotension   . 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 BIOPSY  09/13/2019   Procedure: BIOPSY;  Surgeon: Ladene Artist, MD;  Location: [facility] ENDOSCOPY;  Service: Endoscopy;;  . ESOPHAGOGASTRODUODENOSCOPY (EGD) WITH PROPOFOL N/A 09/13/2019   Procedure: ESOPHAGOGASTRODUODENOSCOPY (EGD) WITH PROPOFOL;  Surgeon: Ladene Artist, MD;  Location: [facility] ENDOSCOPY;  Service: Endoscopy;  Laterality: N/A;  . INGUINAL HERNIA REPAIR Left 12/28/2020   Procedure: LEFT INGUINAL HERNIA REPAIR WITH MESH;  Surgeon: Coralie Keens, MD;  Location: [place];  Service: General;  Laterality: Left;  LMA  . IR PARACENTESIS  09/12/2019  . IR PARACENTESIS  09/29/2019  . IR PARACENTESIS  10/19/2019  . IR PARACENTESIS  11/02/2019  . IR PARACENTESIS  11/10/2019  . IR PARACENTESIS  11/22/2019  . IR PARACENTESIS  12/06/2019  . IR PARACENTESIS  12/16/2019  . IR PARACENTESIS  12/23/2019  . IR PARACENTESIS  12/30/2019  . IR PARACENTESIS  01/06/2020  . IR PARACENTESIS  01/16/2020  . IR PARACENTESIS  02/01/2020  . IR PARACENTESIS  02/08/2020  . IR PARACENTESIS  02/28/2020  . IR PARACENTESIS  03/23/2020  . IR PARACENTESIS  04/17/2020  . IR PARACENTESIS  05/02/2020  .  IR PARACENTESIS  05/11/2020  . IR PARACENTESIS  08/23/2020  . IR RADIOLOGIST EVAL & MGMT  03/20/2020  . IR RADIOLOGIST EVAL & MGMT  06/26/2020  . IR RADIOLOGIST EVAL & MGMT  01/09/2021  . IR RADIOLOGIST EVAL & MGMT  03/06/2021  . IR TIPS  05/11/2020  . RADIOLOGY WITH ANESTHESIA N/A 05/11/2020   Procedure: IR WITH ANESTHESIA TRASJUGULAR INTRAHEPATIC PORTOSYSTEMIC SHUNT;  Surgeon: Arne Cleveland,  MD;  Location: [place];  Service: Radiology;  Laterality: N/A;  . RADIOLOGY WITH ANESTHESIA N/A 02/06/2021   Procedure: CT CRYO ABLATION;  Surgeon: Arne Cleveland, MD;  Location: [facility];  Service: Radiology;  Laterality: N/A;    Allergies: Patient has no known allergies.  Medications: Prior to Admission medications   Medication Sig Start Date End Date Taking? Authorizing Provider  diphenhydrAMINE-zinc acetate (BENADRYL) cream Apply 1 application topically 3 (three) times daily as needed for itching.    [provider]  furosemide (LASIX) 40 MG tablet Take 1 tablet (40 mg total) by mouth daily. 08/09/20   Mosetta Anis, MD  potassium chloride SA (KLOR-CON) 20 MEQ tablet Take 2 tablets by mouth once daily 02/18/21   Doran Stabler, MD  Sofosbuvir-Velpatasvir (EPCLUSA) 400-100 MG TABS Take 1 tablet by mouth daily. 09/12/20   Thayer Headings, MD  spironolactone (ALDACTONE) 100 MG tablet Take 1 tablet (100 mg total) by mouth daily. 08/09/20   Mosetta Anis, MD     Family History  Problem Relation Age of Onset  . Liver cancer Neg Hx   . Colon cancer Neg Hx   . Pancreatic cancer Neg Hx   . Esophageal cancer Neg Hx     Social History   Socioeconomic History  . Marital status: Single    Spouse name: Not on file  . Number of children: Not on file  . Years of education: Not on file  . Highest education level: Not on file  Occupational History  . Not on file  Tobacco Use  . Smoking status: Current Every Day Smoker    Packs/day: 0.30    Years: 50.00    Pack years: 15.00    Types: Cigarettes  . Smokeless tobacco: Never Used  . Tobacco comment: approximately a couple of packs a week  Vaping Use  . Vaping Use: Never used  Substance and Sexual Activity  . Alcohol use: Not Currently    Comment: last drank in September, 2020  . Drug use: Never  . Sexual activity: Not on file  Other Topics Concern  . Not on file  Social History Narrative   Leave with 3 friends   Smoke 2  cigarette  a day   Social Determinants of Health   Financial Resource Strain: Not on file  Food Insecurity: Not on file  Transportation Needs: Not on file  Physical Activity: Not on file  Stress: Not on file  Social Connections: Not on file    ECOG Status: 1 - Symptomatic but completely ambulatory  Review of Systems: A 12 point ROS discussed and pertinent positives are indicated in the HPI above.  All other systems are negative.  Review of Systems  Vital Signs: BP 117/68 (BP Location: Left Arm)   Pulse 77   SpO2 98%   Physical Exam  Mallampati Score:     Imaging: DG Chest 1 View  Result Date: 02/06/2021 CLINICAL DATA:  Preoperative exam for ablation. EXAM: CHEST  1 VIEW COMPARISON:  Feb 24, 2020. FINDINGS: The heart size  and mediastinal contours are within normal limits. Both lungs are clear. Old left clavicular and bilateral rib fractures are noted. IMPRESSION: No active disease. Electronically Signed   By: Marijo Conception M.D.   On: 02/06/2021 11:45   CT GUIDE TISSUE ABLATION  Result Date: 02/06/2021 INDICATION: History of hepatitis, cirrhosis, status post tips creation to manage recurrent large volume abdominal ascites. Interval development of LI-RADS 5 lesion in the left lobe. the lesion is in proximity to the left atrium. EXAM: CT-GUIDED PERCUTANEOUS CRYOABLATION OF LEFT HEPATIC MASS CT-GUIDED CORE BIOPSY, LEFT HEPATIC MASS ANESTHESIA/SEDATION: General MEDICATIONS: 2 g cefoxitin IV. The antibiotic was administered in an appropriate time interval prior to needle puncture of the skin. CONTRAST:  None required PROCEDURE: The procedure, risks, benefits, and alternatives were explained to the patient, with the aid of interpreter. Questions regarding the procedure were encouraged and answered. The patient understands and consents to the procedure. The patient was placed under general anesthesia. The left lobe lesion could be localized under ultrasound. The patient was prepped with  chlorhexidine in a sterile fashion, and a sterile drape was applied covering the operative field. A sterile gown and sterile gloves were used for the procedure. A 22 gauge spinal needle was advanced. Initial unenhanced CT was performed in a supine position to localize the guide needle, and parenchymal calcifications. Under ultrasound guidance, a percutaneous cryoablation probe was advanced into the lesion from a right lateral approach. 2nd probe was advanced into the lesion from a left lateral approach. Probe positioning was confirmed and adjusted under intermittent CT prior to cryoablation. Prior to cryoablation, an 75 gauge trocar needle was advanced to the margin of the lesion, and through this coaxial 18 gauge representative core biopsy samples were obtained, submitted formalin to surgical pathology. The guide needle was removed. Cryoablation was performed through the 2 probes. Initial 10 minute cycle of cryoablation was performed. CT at 8 minutes of cryoablation showed satisfactory ice ball formation. This was followed by a 8 minute thaw cycle. A second 10 minute cycle of cryoablation was then performed. After active thaw, the tract was cauterized and the 2 cryoablation probes were removed. Post-procedural CT was performed. The patient tolerated the procedure well. COMPLICATIONS: None immediate. FINDINGS: The medial left lobe lesion was well seen on ultrasound, and a localizing with a 22 gauge spinal needle under CT confirmed concordance with the enhancing lesion seen on CT and MRI. Technically successful cryoablation was performed using 2 probes. Core biopsy samples of the lesion were also obtained and submitted to surgical pathology. IMPRESSION: CT guided percutaneous core biopsy and cryoablation of segment 4A liver lesion. The patient will be observed overnight. Initial follow-up will be performed in approximately 4 weeks. Electronically Signed   By: Lucrezia Europe M.D.   On: 02/06/2021 16:39   CT  BIOPSY  Result Date: 02/06/2021 INDICATION: History of hepatitis, cirrhosis, status post tips creation to manage recurrent large volume abdominal ascites. Interval development of LI-RADS 5 lesion in the left lobe. the lesion is in proximity to the left atrium. EXAM: CT-GUIDED PERCUTANEOUS CRYOABLATION OF LEFT HEPATIC MASS CT-GUIDED CORE BIOPSY, LEFT HEPATIC MASS ANESTHESIA/SEDATION: General MEDICATIONS: 2 g cefoxitin IV. The antibiotic was administered in an appropriate time interval prior to needle puncture of the skin. CONTRAST:  None required PROCEDURE: The procedure, risks, benefits, and alternatives were explained to the patient, with the aid of interpreter. Questions regarding the procedure were encouraged and answered. The patient understands and consents to the procedure. The patient was placed  under general anesthesia. The left lobe lesion could be localized under ultrasound. The patient was prepped with chlorhexidine in a sterile fashion, and a sterile drape was applied covering the operative field. A sterile gown and sterile gloves were used for the procedure. A 22 gauge spinal needle was advanced. Initial unenhanced CT was performed in a supine position to localize the guide needle, and parenchymal calcifications. Under ultrasound guidance, a percutaneous cryoablation probe was advanced into the lesion from a right lateral approach. 2nd probe was advanced into the lesion from a left lateral approach. Probe positioning was confirmed and adjusted under intermittent CT prior to cryoablation. Prior to cryoablation, an 10 gauge trocar needle was advanced to the margin of the lesion, and through this coaxial 18 gauge representative core biopsy samples were obtained, submitted formalin to surgical pathology. The guide needle was removed. Cryoablation was performed through the 2 probes. Initial 10 minute cycle of cryoablation was performed. CT at 8 minutes of cryoablation showed satisfactory ice ball  formation. This was followed by a 8 minute thaw cycle. A second 10 minute cycle of cryoablation was then performed. After active thaw, the tract was cauterized and the 2 cryoablation probes were removed. Post-procedural CT was performed. The patient tolerated the procedure well. COMPLICATIONS: None immediate. FINDINGS: The medial left lobe lesion was well seen on ultrasound, and a localizing with a 22 gauge spinal needle under CT confirmed concordance with the enhancing lesion seen on CT and MRI. Technically successful cryoablation was performed using 2 probes. Core biopsy samples of the lesion were also obtained and submitted to surgical pathology. IMPRESSION: CT guided percutaneous core biopsy and cryoablation of segment 4A liver lesion. The patient will be observed overnight. Initial follow-up will be performed in approximately 4 weeks. Electronically Signed   By: Lucrezia Europe M.D.   On: 02/06/2021 16:39   IR Radiologist Eval & Mgmt  Result Date: 03/06/2021 Please refer to notes tab for details about interventional procedure. (Op Note)   Labs:  CBC: Recent Labs    09/03/20 1641 12/28/20 0631 01/30/21 1340 02/06/21 1021  WBC 7.9 8.3 8.5 6.9  HGB 11.9* 14.6 13.5 14.6  HCT 35.3* 42.7 40.1 43.4  PLT 161.0 181 168 184    COAGS: Recent Labs    05/11/20 0634 09/03/20 1641 12/28/20 0718 02/06/21 1021  INR 1.2 1.2* 1.3* 1.0    BMP: Recent Labs    05/11/20 0634 07/20/20 0925 08/09/20 1613 09/03/20 1641 10/03/20 1129 10/29/20 1612 12/18/20 1015 01/30/21 1340 02/06/21 1021  NA 137   < > 136   < > 138 136 136 139 137  K 3.5   < > 3.6   < > 3.1* 3.2* 4.3 3.7 4.1  CL 105   < > 101   < > 103 102 103 107 107  CO2 24   < > 23   < > 28 30 27 24 23   GLUCOSE 99   < > 93   < > 113* 103* 77 103* 106*  BUN 11   < > 11   < > 11 12 13 12 11   CALCIUM 8.4*   < > 8.3*   < > 8.5* 9.3 9.2 9.5 9.4  CREATININE 0.81   < > 0.71*   < > 0.90 1.02 0.88 0.94 0.79  GFRNONAA >60  --  97  --  88  --  88  >60 >60  GFRAA >60  --  112  --  102  --  102  --   --    < > =  values in this interval not displayed.    LIVER FUNCTION TESTS: Recent Labs    09/03/20 1641 10/03/20 1129 10/29/20 1612 12/18/20 1015 01/30/21 1340 02/06/21 1021  BILITOT 1.9*   < > 1.2 1.6* 1.0 1.6*  AST 46*   < > 29 35 41 46*  ALT 23   < > 18 21 26 30   ALKPHOS 256*  --  76  --  86 85  PROT 7.8   < > 7.7 7.6 7.4 8.0  ALBUMIN 2.9*  --  3.5  --  3.5 3.7   < > = values in this interval not displayed.    TUMOR MARKERS: Recent Labs    07/20/20 0925 10/29/20 1612  AFPTM 5.8 6.6*    Assessment and Plan:  My impression is that the patient has done well in the month immediately following percutaneous cryoablation of his pathologically proven hepatocellular carcinoma in the medial left hepatic segment.  No clinical evidence of significant hepatic decompensation. We will plan to get a follow-up scan at the 107-month mark, probably CT favored over MR due to some challenges with motion degradation on previous MR imaging.  I will see him back after that scan, and then he will be on a surveillance program.  The patient via interpreter seem to understand, and did ask appropriate questions which were answered.  We went ahead and scheduled the follow-up CT before the patient left today.  He knows to call in the interval with any questions or problems.  Otherwise, I will plan to follow-up with him after I see his CT in August.  Thank you for this interesting consult.  I greatly enjoyed meeting Donald Aguilar and look forward to participating in their care.  A copy of this report was sent to the requesting provider on this date.  Electronically Signed: Rickard Rhymes 03/06/2021, 1:52 PM   I spent a total of    25 Minutes in face to face in clinical consultation, greater than 50% of which was counseling/coordinating care for left hepatocellular carcinoma, post cryoablation.

## 2021-03-06 NOTE — Congregational Nurse Program (Signed)
Home visit with interpreter [name].  Patient states he has been out of medicine about 2 weeks and that his belly feels like it has water in it. Picked up Torsemide and Spironolactone from [employer] and took to patient..  Stressed importance of not missing medicine and that this could be reason for the way his abdomen feels.  He expressed understanding.  Stated he saw Dr. Vernard Gambles this morning and has an appointment for CT scan on 06/06/2021.  [name] RN, Congregational Nurse [phone]

## 2021-03-08 ENCOUNTER — Encounter: Payer: Self-pay | Admitting: *Deleted

## 2021-03-08 NOTE — Progress Notes (Unsigned)

## 2021-03-11 ENCOUNTER — Encounter: Payer: Self-pay | Admitting: Internal Medicine

## 2021-03-11 NOTE — Progress Notes (Signed)
Things That May Be Affecting Your Health:  Alcohol  Hearing loss  Pain   x Depression  Home Safety  Sexual Health   Diabetes  Lack of physical activity  Stress   Difficulty with daily activities  Loneliness  Tiredness   Drug use  Medicines x Tobacco use   Falls  Motor Vehicle Safety  Weight   Food choices  Oral Health  Other    YOUR PERSONALIZED HEALTH PLAN : 1. Schedule your next subsequent Medicare Wellness visit in one year 2. Attend all of your regular appointments to address your medical issues 3. Complete the preventative screenings and services   Annual Wellness Visit   Medicare Covered Preventative Screenings and [place] Men and Women Who How Often Need? Date of Last Service Action  Abdominal Aortic Aneurysm Adults with AAA risk factors Once      Alcohol Misuse and Counseling All Adults Screening once a year if no alcohol misuse. Counseling up to 4 face to face sessions.     Bone Density Measurement  Adults at risk for osteoporosis Once every 2 yrs      Lipid Panel Z13.6 All adults without CV disease Once every 5 yrs x      Colorectal Cancer   Stool sample or  Colonoscopy All adults 60 and older   Once every year  Every 10 years x       Depression All Adults Once a year  Today   Diabetes Screening Blood glucose, post glucose load, or GTT Z13.1  All adults at risk  Pre-diabetics  Once per year  Twice per year      Diabetes  Self-Management Training All adults Diabetics 10 hrs first year; 2 hours subsequent years. Requires Copay     Glaucoma  Diabetics  Family history of glaucoma  African Americans 62 yrs +  Hispanic Americans 4 yrs + Annually - requires coppay      Hepatitis C Z72.89 or F19.20  High Risk for HCV  Born between 1945 and 1965  Annually  Once      HIV Z11.4 All adults based on risk  Annually btw ages 39 & 26 regardless of risk  Annually > 65 yrs if at increased risk      Lung Cancer Screening  Asymptomatic adults aged 20-77 with 30 pack yr history and current smoker OR quit within the last 15 yrs Annually Must have counseling and shared decision making documentation before first screen      Medical Nutrition Therapy Adults with   Diabetes  Renal disease  Kidney transplant within past 3 yrs 3 hours first year; 2 hours subsequent years     Obesity and Counseling All adults Screening once a year Counseling if BMI 30 or higher  Today   Tobacco Use Counseling Adults who use tobacco  Up to 8 visits in one year x    Vaccines Z23  Hepatitis B  Influenza   Pneumonia  Adults   Once  Once every flu season  Two different vaccines separated by one year     Next Annual Wellness Visit People with Medicare Every year  Today     Services & Screenings Women Who How Often Need  Date of Last Service Action  Mammogram  Z12.31 Women over 53 One baseline ages 70-39. Annually ager 40 yrs+      Pap tests All women Annually if high risk. Every 2 yrs for normal risk women  Screening for cervical cancer with   Pap (Z01.419 nl or Z01.411abnl) &  HPV Z11.51 Women aged 105 to 27 Once every 5 yrs     Screening pelvic and breast exams All women Annually if high risk. Every 2 yrs for normal risk women     Sexually Transmitted Diseases  Chlamydia  Gonorrhea  Syphilis All at risk adults Annually for non pregnant females at increased risk         [place] Men Who How Ofter Need  Date of Last Service Action  Prostate Cancer - DRE & PSA Men over 50 Annually.  DRE might require a copay.        Sexually Transmitted Diseases  Syphilis All at risk adults Annually for men at increased risk      Health Maintenance List Health Maintenance  Topic Date Due  . COLONOSCOPY (Pts 45-11yrs Insurance coverage will need to be confirmed)  Never done  . COVID-19 Vaccine (3 - Pfizer risk 4-dose series) 07/24/2020  . TETANUS/TDAP  05/29/2021 (Originally 12/05/1969)  . PNA vac  Low Risk Adult (1 of 2 - PCV13) 05/29/2021 (Originally 12/04/2017)  . INFLUENZA VACCINE  05/20/2021  . Hepatitis C Screening  Completed  . HPV VACCINES  Aged Out

## 2021-03-20 NOTE — Congregational Nurse Program (Signed)
Home visit with interpreter [name].  Reminded patient of appointment with Dr. Linus Salmons on Friday 03/22/2021 at 9:45 am and that Medicaid transportation will pick him up approximately 30-60 minutes prior to appointment.  We also helped him complete application for food stamps.  [name] RN, Congregational Nurse [phone]

## 2021-03-22 ENCOUNTER — Ambulatory Visit (INDEPENDENT_AMBULATORY_CARE_PROVIDER_SITE_OTHER): Payer: Medicare Other | Admitting: Internal Medicine

## 2021-03-22 ENCOUNTER — Other Ambulatory Visit: Payer: Self-pay

## 2021-03-22 ENCOUNTER — Encounter: Payer: Self-pay | Admitting: Internal Medicine

## 2021-03-22 VITALS — BP 111/56 | HR 70 | Temp 98.3°F | Wt 139.0 lb

## 2021-03-22 DIAGNOSIS — B182 Chronic viral hepatitis C: Secondary | ICD-10-CM | POA: Diagnosis present

## 2021-03-22 DIAGNOSIS — Z5181 Encounter for therapeutic drug level monitoring: Secondary | ICD-10-CM | POA: Diagnosis not present

## 2021-03-22 NOTE — Assessment & Plan Note (Signed)
Recent CMP noted and creat wnl.  No issues.

## 2021-03-22 NOTE — Progress Notes (Signed)
Patient ID: Donald Aguilar, male   DOB: 12-26-1950, 70 y.o.   MRN: [id number]   Subjective:    Patient ID: Donald Aguilar, male    DOB: [birth year], 70 y.o.   MRN: [id number]  HPI He is here for follow up of hepatitis C and cirrhosis.   He is currently on Epclusa for 24 weeks that started on about 09/17/20 and he is having no issues with the medication.  He was previously referred by me to hepatology/Liver Care but failed to keep any of his appointments that were arranged for him and in subsequent discussion with Dr. Loletha Carrow, we opted to start treatment of his chronic hepatitis C.   His early in treatment hepatitis C RNA was < 15 after starting treatment from his baseline of 200,000.  He reports no fatigue, no rash or other concerns.  He was found on MRI and subsequent biopsy to be positive for hepatocellular carcinoma.  He is managed by GI, IR and now has been to see hepatology for consideration of transplant evaluation.  His only complaint today is itchiness on his right side by his liver.  The interpretor is connected to by phone.  He has no other complaints.          Review of Systems  Constitutional: Negative for fatigue.  Gastrointestinal: Negative for diarrhea and nausea.  Skin: Negative for rash.       Objective:   Physical Exam Eyes:     General: No scleral icterus. Cardiovascular:     Rate and Rhythm: Normal rate.  Pulmonary:     Effort: Pulmonary effort is normal.  Neurological:     General: No focal deficit present.     Mental Status: He is alert.  Psychiatric:        Mood and Affect: Mood normal.   SH: no alcohol        Assessment & Plan:

## 2021-03-22 NOTE — Assessment & Plan Note (Signed)
He seems to be doing well and largely compliant now with the help from the congregational nurse.  Will check his RNA today and he should be finished later this month with his 24 week course and he will rtc in 3 months for SVR12.

## 2021-03-24 LAB — HEPATITIS C RNA QUANTITATIVE
HCV Quantitative Log: <1.18 log IU/mL
HCV RNA, PCR, QN: <15 IU/mL

## 2021-03-27 NOTE — Congregational Nurse Program (Signed)
Home visit with interpreter [name].  Reminded patient of appointment with [facility] Internal Medicine clinic on 04/02/2021 and to take his medicines with him.  Currently only taking Spironalactone and Furosimide and topical OTC cream for itching. Advised him that transportation to appointment has been scheduled thru Medicaid.  We also helped him read mail from [place] regarding food stamps.  [name] RN, Congregational Nurse [phone]

## 2021-04-02 ENCOUNTER — Encounter: Payer: Self-pay | Admitting: Internal Medicine

## 2021-04-02 ENCOUNTER — Ambulatory Visit (INDEPENDENT_AMBULATORY_CARE_PROVIDER_SITE_OTHER): Payer: Medicare Other | Admitting: Internal Medicine

## 2021-04-02 VITALS — BP 110/64 | HR 80 | Temp 98.0°F | Wt 143.9 lb

## 2021-04-02 DIAGNOSIS — R1031 Right lower quadrant pain: Secondary | ICD-10-CM | POA: Diagnosis not present

## 2021-04-02 DIAGNOSIS — Z0000 Encounter for general adult medical examination without abnormal findings: Secondary | ICD-10-CM

## 2021-04-02 DIAGNOSIS — R109 Unspecified abdominal pain: Secondary | ICD-10-CM | POA: Insufficient documentation

## 2021-04-02 NOTE — Assessment & Plan Note (Signed)
Discussed pneumococcal vaccination with the patient.  He is agreeable to receiving his vaccination.  Discussed that we are receiving the new pneumonia vaccine in 1 week and he can return anytime after that to receive this vaccination.

## 2021-04-02 NOTE — Assessment & Plan Note (Signed)
Patient presents today for evaluation of his abdominal pain and itching.  He states that his abdominal pain has been going on for about a year.  He states it got better after his recent cryoablation however then returned.  He states that it comes and goes but it is most often daily.  He describes the pain as someone squeezing fluid out of the cyst.  He states the pain is worse when he lays on his side or back and also when he takes long walks.  His pain is tolerable and does not limit his functional status.  He is tolerating oral intake.  Denies any fevers, chills, nausea, vomiting or diarrhea.  Does endorse some constipation but states that he knows he does not drink enough water.  Encouraged increasing water and fiber intake.  Also discussed MiraLAX and Senokot for constipation if this worsens.  He states the itching is right around where the pain is in the right lower quadrant, almost near his umbilical region.  There is no visible rash or lesion.  Some skin discoloration and darkening.  Assessment/plan: Unclear etiology of patient's abdominal pain and itching.  This may likely be related to his liver disease. Will continue to monitor.

## 2021-04-02 NOTE — Patient Instructions (Signed)
Mr. Donald Aguilar,   It was a pleasure meeting you today. Today we discussed your abdominal pain.  Please let me know if this persists or worsens.  We will follow-up in about 3 months.  You can come by our clinic any day next week or after to receive the pneumonia vaccine.   Please call the internal medicine center clinic if you have any questions or concerns, we may be able to help and keep you from a long and expensive emergency room wait. Our clinic and after hours phone number is [phone], the best time to call is Monday through Friday 9 am to 4 pm but there is always someone available 24/7 if you have an emergency. If you need medication refills please notify your pharmacy one week in advance and they will send [place] a request.   Thank you for allowing [place] to be a part of your care!

## 2021-04-02 NOTE — Progress Notes (Signed)
   CC: Abdominal pain, itching  HPI:  [name] is a 70 y.o. 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  He presents today for evaluation of his right lower abdominal pain and itching. For details of today's visit and the status of his chronic medical issues please refer to the assessment and plan.   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  Gastrointestinal:  Positive for abdominal pain and constipation. Negative for blood in stool, diarrhea, melena, nausea and vomiting.  Skin:  Positive for itching. Negative for rash.    Physical Exam:  Vitals:   04/02/21 1410  BP: 110/64  Pulse: 80  Temp: 98 F (36.7 C)  TempSrc: Oral  Weight: 143 lb 14.4 oz (65.3 kg)   Physical Exam Vitals reviewed.  Constitutional:      General: He is not in acute distress.    Appearance: Normal appearance. He is not ill-appearing.  Cardiovascular:     Rate and Rhythm: Normal rate and regular rhythm.     Heart sounds: Normal heart sounds. No murmur heard.   No friction rub. No gallop.  Pulmonary:     Effort: Pulmonary effort is normal. No respiratory distress.     Breath sounds: Normal breath sounds. No wheezing or rales.  Abdominal:     General: Abdomen is flat. Bowel sounds are normal. There is no distension.     Palpations: Abdomen is soft. There is no mass.     Tenderness: There is abdominal tenderness. There is no  guarding.     Comments: Tenderness to palpation of the right upper and lower quadrants  Musculoskeletal:     Right lower leg: No edema.     Left lower leg: No edema.  Skin:    General: Skin is warm and dry.     Coloration: Skin is not jaundiced.     Findings: No rash.  Neurological:     Mental Status: He is alert and oriented to person, place, and time.  Psychiatric:        Mood and Affect: Mood normal.        Behavior: Behavior normal.        Thought Content: Thought content normal.        Judgment: Judgment normal.     Assessment & Plan:   See Encounters Tab for problem based charting.  Patient discussed with Dr. Evette Doffing

## 2021-04-03 NOTE — Progress Notes (Signed)
Internal Medicine Clinic Attending ° °Case discussed with Dr. Rehman  At the time of the visit.  We reviewed the resident’s history and exam and pertinent patient test results.  I agree with the assessment, diagnosis, and plan of care documented in the resident’s note.  ° °

## 2021-04-17 ENCOUNTER — Telehealth: Payer: Self-pay

## 2021-04-17 NOTE — Telephone Encounter (Signed)
-----   Message from [name], RN sent at 04/16/2021  5:51 PM EDT ----- Regarding: interpreter needed [facility],  Just realized that an interpreter has not been scheduled for Mr. Almquist follow-up appointment at [place] on 04/18/2021 at 10:00 am.  Looks like you were the one that handled that back in March so was wondering if you could request one again.  Thanks so much. Hoyle Sauer [phone]

## 2021-04-17 NOTE — Telephone Encounter (Signed)
I have a left a voicemail for Myna Hidalgo, [place] dispatcher/interpreter. I left a detailed message with patient's [facility] appt information and the appt time for tomorrow. I spoke with Hoyle Sauer to let her know that I have reached out to interpreter services but I am not sure if they will be able to accommodate request on such short notice. Hoyle Sauer will continue to see if she is able to reach her interpreter. Advised that I will give her an update once I hear something. Hoyle Sauer verbalized understanding and had no other concerns.

## 2021-04-17 NOTE — Telephone Encounter (Signed)
Phone call to patient to remind him of 10:00 am appointment tomorrow with [facility].  Told him to bring his medicines and that his ride should arrive between 9;00 and 9:30 am.  He stated he has not been in contact with anyone who is sick or known to have Covid.  He feels fine and has no symptoms.  CN will meet him at appointment and if no interpreter comes will contact [place] by phone.  [name] RN, Congregational Nurse [phone]

## 2021-04-18 NOTE — Telephone Encounter (Signed)
Spoke with [name], she was able to find an interpreter for patient's appt today. Advised that it would be best for her to go ahead and request an interpreter if [facility] schedules patient for another appt. I have provided her with the e-mail to make this request in the future ([email or web address]). Hoyle Sauer verbalized understanding and had no concerns at the end of the call.

## 2021-04-19 NOTE — Congregational Nurse Program (Signed)
Accompanied patient to appointment at [place] with NP [facility].  She talked with patient regarding possible liver transplant with interpreter Keo Eban assisting by phone.  Patient states he has no one to support him post transplant, therefore at this time this treatment is not feasible however if he can secure someone to help him he is to return and pursue liver transplant.  [name] RN, Congregational Nurse [phone]

## 2021-04-25 ENCOUNTER — Encounter: Payer: Self-pay | Admitting: Internal Medicine

## 2021-04-25 ENCOUNTER — Ambulatory Visit (INDEPENDENT_AMBULATORY_CARE_PROVIDER_SITE_OTHER): Payer: Medicare Other | Admitting: Internal Medicine

## 2021-04-25 ENCOUNTER — Other Ambulatory Visit: Payer: Self-pay

## 2021-04-25 VITALS — BP 96/63 | HR 75 | Temp 97.7°F | Ht 64.0 in | Wt 145.9 lb

## 2021-04-25 DIAGNOSIS — R42 Dizziness and giddiness: Secondary | ICD-10-CM | POA: Diagnosis not present

## 2021-04-25 DIAGNOSIS — Z0000 Encounter for general adult medical examination without abnormal findings: Secondary | ICD-10-CM

## 2021-04-25 DIAGNOSIS — K7460 Unspecified cirrhosis of liver: Secondary | ICD-10-CM | POA: Diagnosis not present

## 2021-04-25 DIAGNOSIS — Z23 Encounter for immunization: Secondary | ICD-10-CM | POA: Diagnosis present

## 2021-04-25 DIAGNOSIS — H539 Unspecified visual disturbance: Secondary | ICD-10-CM

## 2021-04-25 MED ORDER — FUROSEMIDE 20 MG PO TABS: 20.0000 mg | ORAL_TABLET | Freq: Every day | ORAL | 2 refills | Status: DC

## 2021-04-25 MED ORDER — SPIRONOLACTONE 50 MG PO TABS: 50.0000 mg | ORAL_TABLET | Freq: Every day | ORAL | 2 refills | Status: DC

## 2021-04-25 NOTE — Assessment & Plan Note (Signed)
Patient presenting with dizziness that began about 4 months ago. Patient states that he feels lightheaded and that he may pass out. He has not had any syncopal episodes or falls, but has not been going to church recently because he is afraid of passing out. Of note, the patient's BP is 96/63 today. Suspect that his dizziness is secondary to his low BP. The patient takes lasix 40mg  and spironolactone 100mg  for his liver disease. We will be reducing both of these doses in half in attempt to increase his blood pressure and alleviate his dizziness.   Plan: Adjust lasix/spirono doses: will now begin 20mg  lasix and 50mg  spironolactone Follow up 2-3 months

## 2021-04-25 NOTE — Assessment & Plan Note (Signed)
Patient states that over the last year he has had blurry vision in both of his eyes, but it is worse on the right side. He uses reading glasses on occasion, but they do not help him much. He also notes that at night, he gets floaters in his right eye and sees flashes of lights. He denies any pain or sudden loss in vision. Patient has never followed with an optometrist or ophthalmologist.   Plan: Refer to ophthalmology

## 2021-04-25 NOTE — Progress Notes (Signed)
   CC: dizziness, visual changes  HPI:  [name] is a 70 y.o. male with a PMHx of cirrhosis with recurrent ascites s/p TIPS, hepatitis C for which he recently finished treatment, and hepatocellular carcinoma s/p cryoablation. He is presenting to the [facility] for dizziness x4 months and right sided visual changes x1 year.   Please see problems based assessment and plan for additional details.   Past Medical History:  Diagnosis Date   Arthritis    knees   Cirrhosis ([place]) 08/2019   with ascites, varices   GERD (gastroesophageal reflux disease)    Hepatitis C    Genotype 1a   History of alcohol abuse    Hypotension    Hypotension    Neuromuscular disorder ([facility])    peropheral neuropathy   Review of Systems:  as per HPI  Physical Exam:  Vitals:   04/25/21 1325  BP: 96/63  Pulse: 75  Temp: 97.7 F (36.5 C)  TempSrc: Oral  SpO2: 99%  Weight: 145 lb 14.4 oz (66.2 kg)  Height: 5\' 4"  (1.626 m)   Physical Exam Constitutional:      General: He is not in acute distress.    Appearance: Normal appearance.  Eyes:     Extraocular Movements: Extraocular movements intact.     Pupils: Pupils are equal, round, and reactive to light.  Cardiovascular:     Rate and Rhythm: Normal rate and regular rhythm.     Heart sounds: Normal heart sounds.  Pulmonary:     Effort: Pulmonary effort is normal.     Breath sounds: Normal breath sounds. No wheezing, rhonchi or rales.  Abdominal:     Palpations: Abdomen is soft.     Tenderness: There is abdominal tenderness.     Comments: RLQ abdominal tenderness (since drain placement/removal)  Musculoskeletal:        General: No swelling or tenderness.  Skin:    General: Skin is warm and dry.  Neurological:     General: No focal deficit present.     Mental Status: He is alert and oriented to person, place, and time.  Psychiatric:        Mood and Affect: Mood normal.        Behavior: Behavior normal.     Assessment & Plan:   See  Encounters Tab for problem based charting.  Patient seen with Dr. Heber Peavine

## 2021-04-25 NOTE — Assessment & Plan Note (Signed)
Received pneumonia (prevnar 20) vaccine today.

## 2021-04-25 NOTE — Patient Instructions (Addendum)
Thank you, [name] for allowing [place] to provide your care today. Today we discussed your dizziness, vision changes, and some healthcare maintenance items.  Dizziness: Your blood pressure has been running low, which we think is because of the 2 water pills you take. Your low blood pressure could be the cause of your dizziness. We are cutting both of those doses in half, so you will take 20mg  of lasix and 50mg  of spironolactone. Feel free to cut your pills in half and then when you run out, the new prescription will be at the pharmacy.    Vision changes: We are going to refer to you an ophthalmologist (eye doctor) so you can get a proper eye exam Healthcare maintenance: Today you received the pneumonia shot and are now up to date with this!  I have ordered the following labs for you:  Lab Orders  No laboratory test(s) ordered today     Tests ordered today:  None  Referrals ordered today:   Referral Orders  Ambulatory referral to Ophthalmology     I have ordered the following medication/changed the following medications:   Stop the following medications: Medications Discontinued During This Encounter  Medication Reason   furosemide (LASIX) 40 MG tablet    spironolactone (ALDACTONE) 100 MG tablet      Start the following medications: Meds ordered this encounter  Medications   furosemide (LASIX) 20 MG tablet    Sig: Take 1 tablet (20 mg total) by mouth daily.    Dispense:  30 tablet    Refill:  2   spironolactone (ALDACTONE) 50 MG tablet    Sig: Take 1 tablet (50 mg total) by mouth daily for 90 doses.    Dispense:  30 tablet    Refill:  2     Follow up: 2-3 months but please call if you are still feeling dizzy and we can see you sooner    Remember: To cut your lasix and spironolactone in half until you get the new prescription   Should you have any questions or concerns please call the internal medicine clinic at [phone].    Buddy Duty, D.O. [place]

## 2021-05-01 ENCOUNTER — Other Ambulatory Visit: Payer: Self-pay | Admitting: Interventional Radiology

## 2021-05-01 DIAGNOSIS — C220 Liver cell carcinoma: Secondary | ICD-10-CM

## 2021-05-01 NOTE — Congregational Nurse Program (Signed)
Home visit with interpreter [name].  Patient is taking reduced dose of both Furosemide and Spironolactone as ordered by [name] on 04/25/2021.  States he is not having difficulty cutting the pills in half.  BP today remains low at 92/64 but says he has experienced less dizziness this week.  Patient asking about eye doctor appointment.  We told him we have not heard back regarding appointment but will follow-up in a few days if CN does not receive call about this.  Follow-up in  week to recheck BP.  [name] RN, Congregational Nurse [phone]

## 2021-05-02 ENCOUNTER — Encounter: Payer: Medicare Other | Admitting: Internal Medicine

## 2021-05-06 NOTE — Progress Notes (Signed)
Internal Medicine Clinic Attending ° °I saw and evaluated the patient.  I personally confirmed the key portions of the history and exam documented by Dr. Atway and I reviewed pertinent patient test results.  The assessment, diagnosis, and plan were formulated together and I agree with the documentation in the resident’s note.  °

## 2021-05-07 ENCOUNTER — Telehealth: Payer: Self-pay

## 2021-05-07 NOTE — Telephone Encounter (Signed)
Phone call from [facility].  Patient was referred by PCP for eye exam.  Appointment scheduled for 07/22/2021 at 1:15 pm.  [name] RN, [place] Nurse [phone]

## 2021-06-05 ENCOUNTER — Telehealth: Payer: Self-pay

## 2021-06-05 NOTE — Telephone Encounter (Signed)
Received a call from Hoyle Sauer, Scientist, research (physical sciences), to discuss patient's medications. She reports that patient is getting ready to get his medications from the pharmacy and wanted to know if he needed to continue Potassium. She states that patient has not been taking any potassium but the pharmacy said that they have a prescription. Advised that the last refill of Potassium was back in 02/2021. Advised that I will check with Dr. Loletha Carrow to see if he would like patient to come in for labs or continue Potassium. Hoyle Sauer is aware that I will contact her next week once I have more information. Hoyle Sauer states that she will have the pharmacy hold the RX until she hears back from me. Please advise, thanks.

## 2021-06-05 NOTE — Congregational Nurse Program (Signed)
Home visit with interpreter [name] assisting by phone. Took contrast material for tomorrow's C-T scan to patient and reviewed instructions. Voiced understanding.  Told him that Medicaid transportation should arrive between 7:45 and 8:15 am.  Patient stated he only had 1 dose of Spironolactone and Furosemide left.  Phone call to [facility] to request refills.  They stated he also had prescription for Potassium however patient has not been taking it for several months since last lab work showed potassium WNL.  Phone call to [place] at [place] who will check with Dr, Loletha Carrow to see if patient needs to continue KCL.  Patient will pick up Spironolactone and Furosemide today.  [name] RN, Congregational Nurse [phone]

## 2021-06-06 ENCOUNTER — Ambulatory Visit (HOSPITAL_COMMUNITY)
Admission: RE | Admit: 2021-06-06 | Discharge: 2021-06-06 | Disposition: A | Discharge status: Home / Self Care | Payer: Medicare Other | Source: Ambulatory Visit | Attending: Interventional Radiology | Admitting: Interventional Radiology

## 2021-06-06 ENCOUNTER — Other Ambulatory Visit: Payer: Self-pay

## 2021-06-06 DIAGNOSIS — C220 Liver cell carcinoma: Secondary | ICD-10-CM | POA: Diagnosis present

## 2021-06-06 LAB — POCT I-STAT CREATININE: Creatinine, Ser: 0.8 mg/dL (ref 0.61–1.24)

## 2021-06-06 IMAGING — CT CT ABDOMEN WO/W CM
3 of 15 series · 9 of 46 positions shown, 15 images · IV contrast (APPLIED)
Comparison: MRI of the abdomen of [DATE]. Also prior CT
study from [DATE].

CLINICAL DATA: Ablation of the liver in the past, RIGHT-sided
abdominal pain and constipation by report in this 70-year-old male.
Also with previous tips procedure.

EXAM:
CT ABDOMEN WITHOUT AND WITH CONTRAST
TECHNIQUE: Multidetector CT imaging of the abdomen was performed following the
standard protocol before and following the bolus administration of
intravenous contrast.
CONTRAST:  75mL OMNIPAQUE IOHEXOL 350 MG/ML SOLN

[Series 3: coronal without · coronal · non-contrast · 0.50mm/px · 1 of 91 slices shown, 2 images]
[im 46/91  soft-tissue]
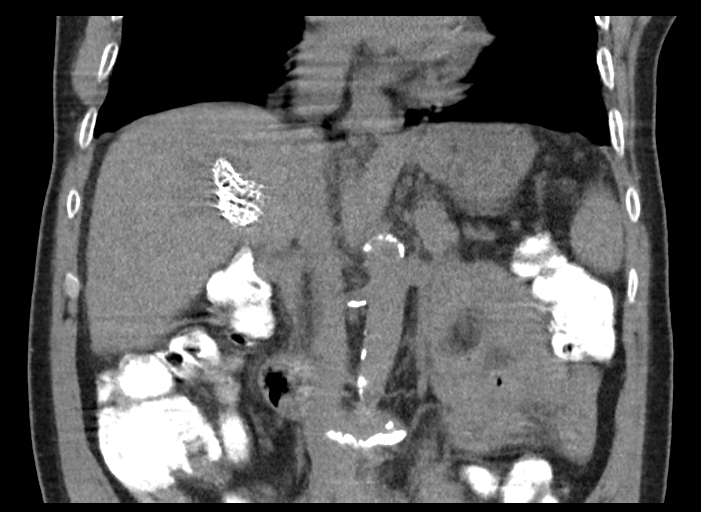
[im 46/91  bone]
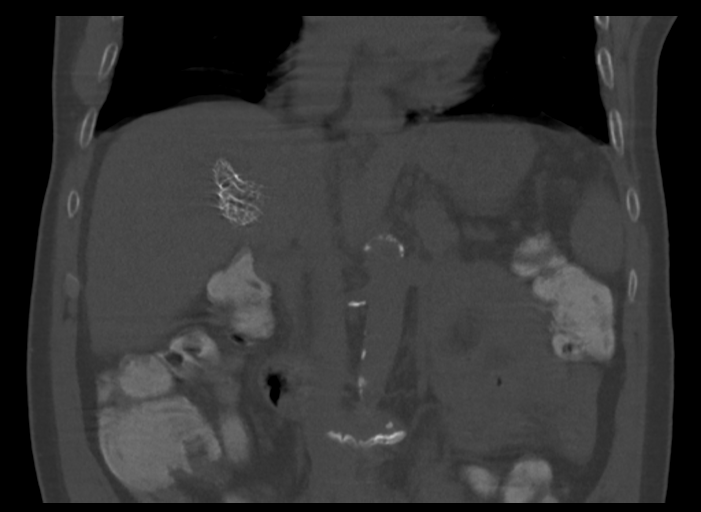

[Series 6: axial arterial · axial · arterial · 0.69mm/px · z∈[-238,-84]mm · 4 of 85 slices shown]
[im 17/85  soft-tissue]
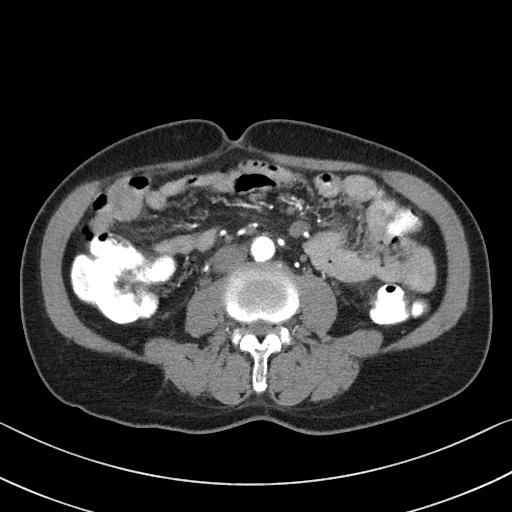
[im 34/85  soft-tissue]
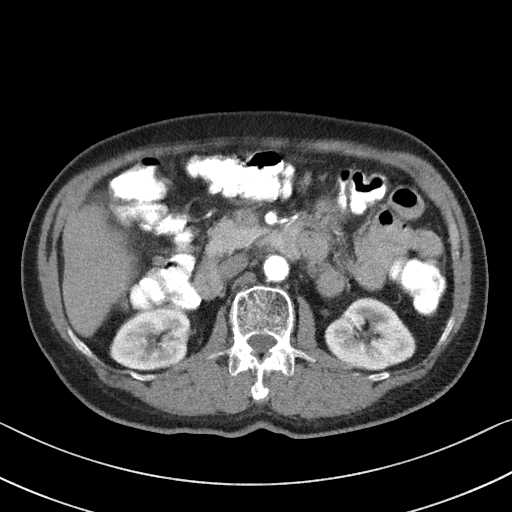
[im 51/85  soft-tissue]
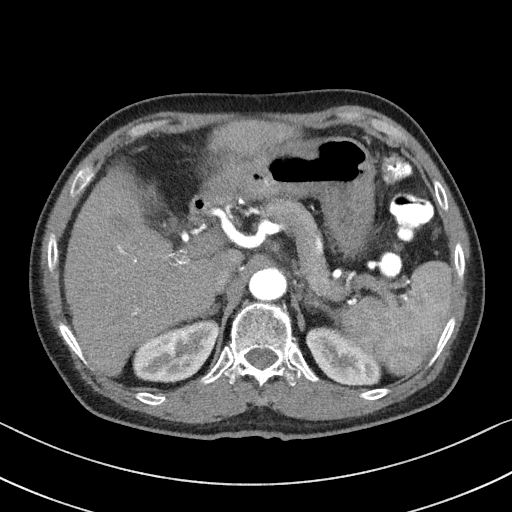
[im 68/85  soft-tissue]
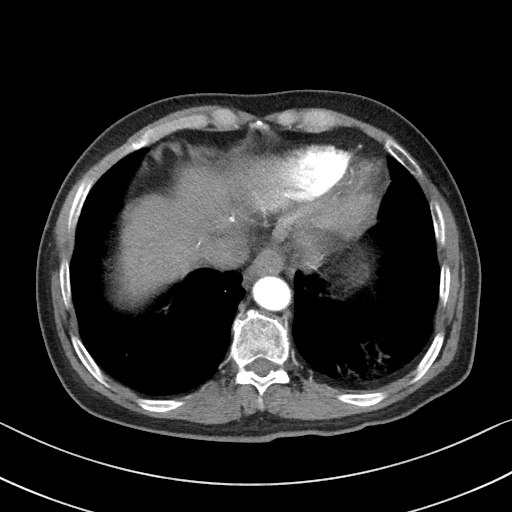

[Series 11: axial venous · axial · portal-venous · 0.70mm/px · z∈[-233,-86]mm · 4 of 83 slices shown, 9 images]
[im 17/83  soft-tissue]
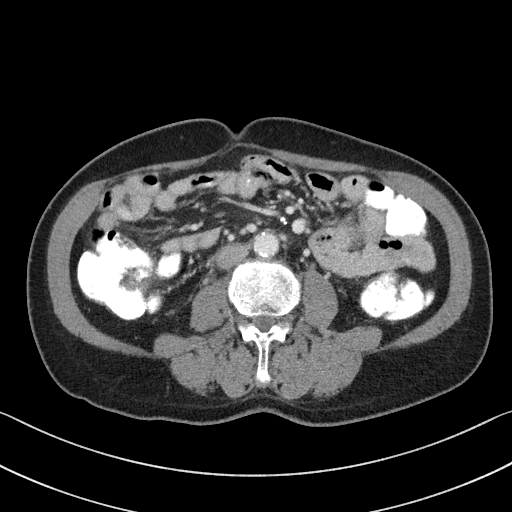
[im 17/83  lung]
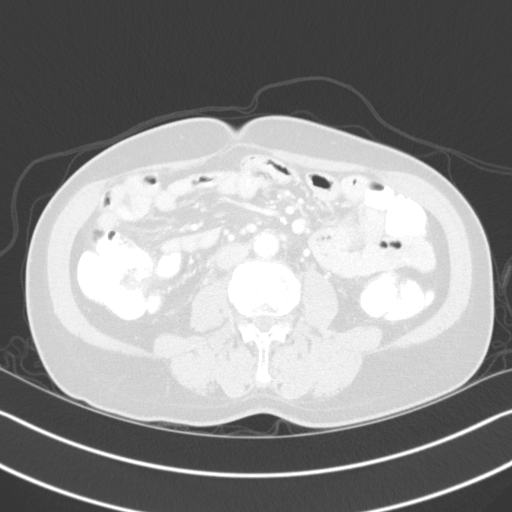
[im 17/83  bone]
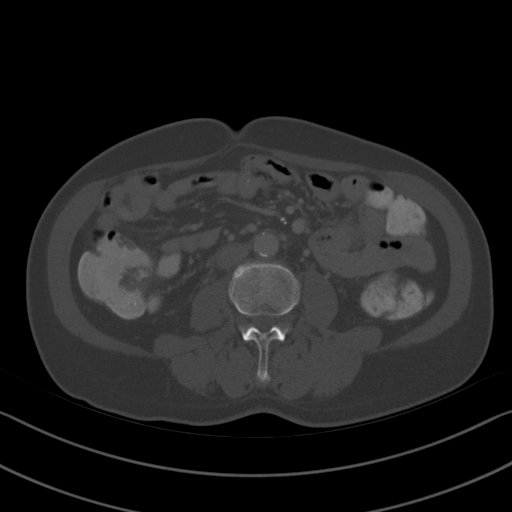
[im 33/83  soft-tissue]
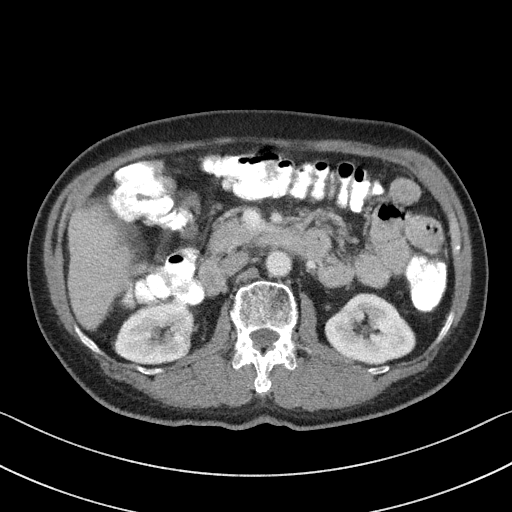
[im 33/83  lung]
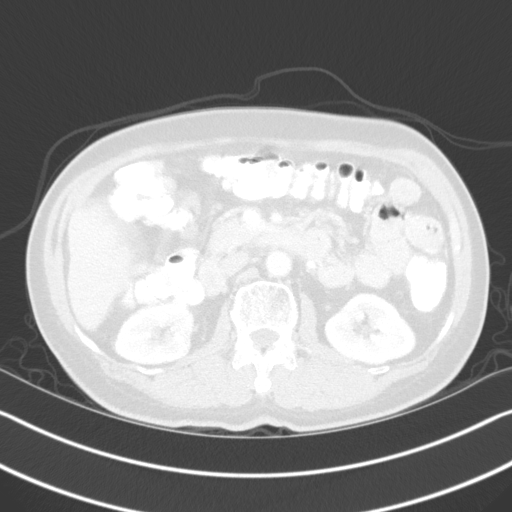
[im 50/83  soft-tissue]
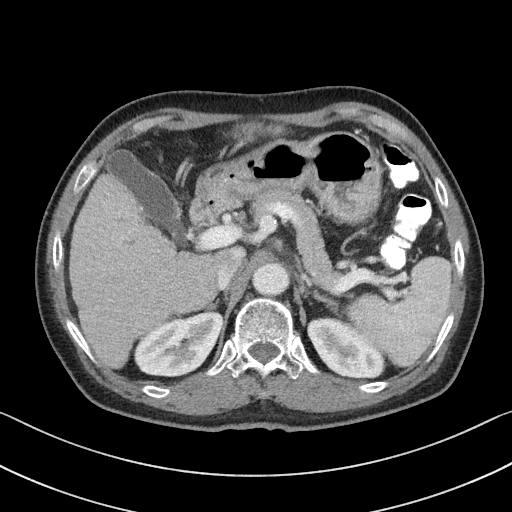
[im 50/83  lung]
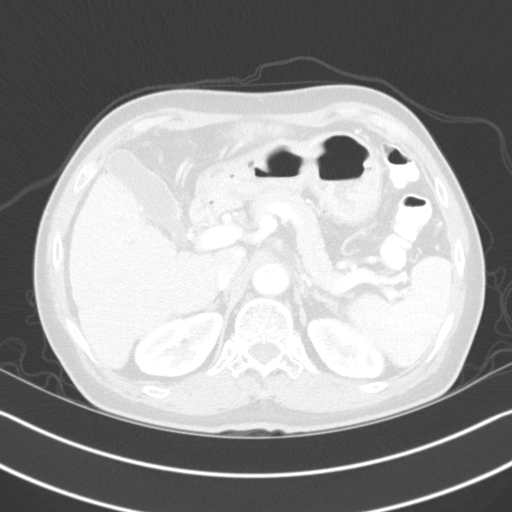
[im 66/83  soft-tissue]
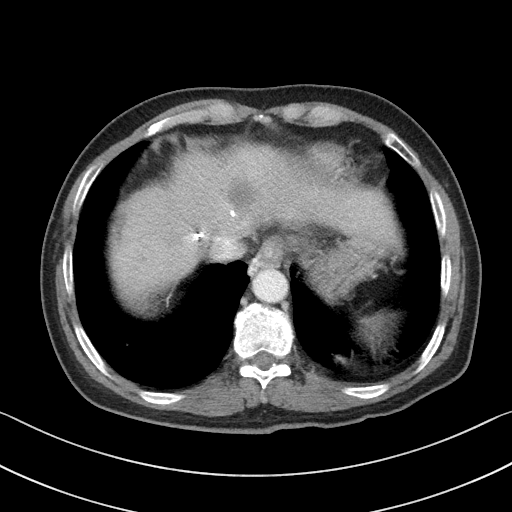
[im 66/83  lung]
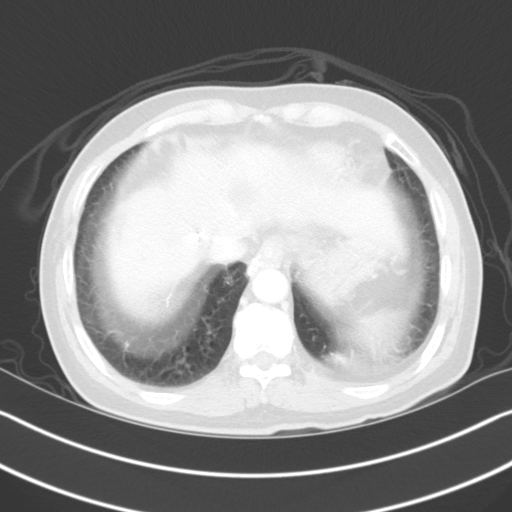

[9 of 46 positions shown; findings below may reference images not displayed]

FINDINGS: Lower chest: Lung bases without effusion or sign of consolidative
changes.

Hepatobiliary: Signs of hepatic ablation. Signs of cirrhosis with
stigmata of portal hypertension including portosystemic collaterals
as on the prior study.

Observation 1: Ablated area in the superior aspect of the LEFT
hepatic lobe, at the boundary of hepatic subsegment II and IV
without signs of enhancement to indicate residual disease LR
category TR nonviable.

Observation 2: Area without arterial phase enhancement below this
location showing washout on venous phase (image [DATE]) LR category
3.

Observation 3: Previously described area in hepatic subsegment VIII
still with washout, adjacent to the cephalad margin of the tips
measuring 13 mm, previously 10 mm and shown to have hypervascular
enhancement on a study from [DATE], categorized previously
as LR category 5 for this reason. (Image 14/16).

Observation 4: Ablation zone without signs of enhancement in hepatic
subsegment IV (image 23/16) no visible abnormality on previous CT,
area of abnormality visualized on ultrasound. No viable tumor in
this location.

Tips appears patent. Portal vein is patent. Splenic vein and SMV are
patent. Hepatic arterial anatomy arises from a common trunk, SMA and
celiac with common origin.

Note that along the medial aspect of the ablated zone in hepatic
subsegment II there is further prominence of the hepatic artery
which was diminutive previously. On this very early phase there is
early draining portal venous branch along the inter lobar fissure
(image [DATE]) the artery to this area is hypertrophied as well
compared to the previous imaging study.

Particularly when compared to the study [DATE]. There is
some motion degradation of images in this area on the current study.

Cholelithiasis without pericholecystic stranding. No LEFT hepatic
biliary duct dilation. RIGHT hepatic biliary duct dilation along the
margin of the tips extending into cephalad RIGHT hepatic lobe with
slight increase over a series of priors, now mild-to-moderate.

Pancreas: Normal, without mass, inflammation or ductal dilatation.

Spleen: Spleen normal size and contour.

Adrenals/Urinary Tract: Adrenal glands are normal.

Symmetric renal enhancement. No hydronephrosis. LEFT lower pole
calculus with similar appearance to prior imaging. No suspicious
renal lesion.

Stomach/Bowel: No acute gastrointestinal process to the extent
evaluated.

Vascular/Lymphatic: Common visceral trunk as described. There may be
a diminutive celiac origin which does not appear opacified perhaps
LEFT gastric artery arising just above the SMA origin. Splenic and
hepatic arterial supply arise from a single widely patent vessel
just below this level. SMA also supplied by the same vessel.

Other: No ascites.

Musculoskeletal: Spinal degenerative changes without acute or
destructive bone finding.
IMPRESSION: 1. LR TR nonviable area in hepatic subsegment II with small arterial
portal venous fistula. Focused ultrasound could be utilized for
further assessment as warranted.
2. Treated area in the LEFT hepatic lobe medial segment without
signs of disease.
3. Small LR 5 lesion along the apex of the tips is unchanged.
4. New lesion with washout, categorized on the basis of this study
is LR 3 with the caveat that the timing for the arterial phase is
early rather than late arterial and this may in fact represent a
small LR 4 lesion. Consider short interval follow-up and specify
liver only coverage to mitigate motion artifact. The patient may
also benefit from translation during the exam to ensure breath
holding is performed.
5. Cholelithiasis without pericholecystic stranding.
6. LEFT lower pole calculus.
7. Post tips procedure as before.
8. Aortic atherosclerosis.

These results were called by telephone at the time of interpretation
on [DATE] at [DATE] to provider SERGE DIDIER , who verbally
acknowledged these results.

Aortic Atherosclerosis ([JM]-[JM]).

## 2021-06-06 MED ORDER — IOHEXOL 350 MG/ML SOLN
75.0000 mL | Freq: Once | INTRAVENOUS | Status: AC | PRN
  Administered 2021-06-06: 75 mL via INTRAVENOUS

## 2021-06-10 NOTE — Telephone Encounter (Signed)
I prescribed a course of potassium several months ago based on some lab results, but this needs to managed by his PCP.  He should be seen in that clinic and have level checked regularly to determine if further supplement needed.  - HD

## 2021-06-10 NOTE — Telephone Encounter (Signed)
Spoke with Hoyle Sauer, congregational nurse, in regards to information. She states that she believes patient is due for a follow up with PCP at this time and will have them check. Hoyle Sauer verbalized understanding and had no concerns at the end of the call.

## 2021-06-12 NOTE — Congregational Nurse Program (Signed)
Home visit to tell patient he has an appointment at [place] on 06/18/2021 @ 9:15 am.  This was requested by his GI Dr. Loletha Carrow to evaluate need to continue Potassium.  Patient has not taken it for several months.  Medicaid transportation requested.  [name] RN, Congregational Nurse [phone]

## 2021-06-18 ENCOUNTER — Ambulatory Visit (INDEPENDENT_AMBULATORY_CARE_PROVIDER_SITE_OTHER): Payer: Medicare Other | Admitting: Internal Medicine

## 2021-06-18 ENCOUNTER — Telehealth: Payer: Self-pay | Admitting: *Deleted

## 2021-06-18 ENCOUNTER — Encounter: Payer: Self-pay | Admitting: Internal Medicine

## 2021-06-18 VITALS — BP 108/66 | HR 76 | Temp 97.6°F | Resp 20 | Ht 65.5 in | Wt 147.6 lb

## 2021-06-18 DIAGNOSIS — M25562 Pain in left knee: Secondary | ICD-10-CM | POA: Diagnosis not present

## 2021-06-18 DIAGNOSIS — H539 Unspecified visual disturbance: Secondary | ICD-10-CM

## 2021-06-18 DIAGNOSIS — R1031 Right lower quadrant pain: Secondary | ICD-10-CM | POA: Diagnosis not present

## 2021-06-18 DIAGNOSIS — M25561 Pain in right knee: Secondary | ICD-10-CM

## 2021-06-18 DIAGNOSIS — M170 Bilateral primary osteoarthritis of knee: Secondary | ICD-10-CM | POA: Diagnosis not present

## 2021-06-18 DIAGNOSIS — E876 Hypokalemia: Secondary | ICD-10-CM

## 2021-06-18 DIAGNOSIS — K5900 Constipation, unspecified: Secondary | ICD-10-CM | POA: Diagnosis not present

## 2021-06-18 DIAGNOSIS — K7469 Other cirrhosis of liver: Secondary | ICD-10-CM

## 2021-06-18 DIAGNOSIS — M25569 Pain in unspecified knee: Secondary | ICD-10-CM | POA: Insufficient documentation

## 2021-06-18 LAB — PROTIME-INR
INR: 1.3 — ABNORMAL HIGH (ref 0.8–1.2)
Prothrombin Time: 15.9 seconds — ABNORMAL HIGH (ref 11.4–15.2)

## 2021-06-18 MED ORDER — LACTULOSE 10 G PO PACK: 10.0000 g | PACK | Freq: Two times a day (BID) | ORAL | 0 refills | Status: DC | PRN

## 2021-06-18 MED ORDER — LACTULOSE 10 GM/15ML PO SOLN: 10.0000 g | Freq: Two times a day (BID) | ORAL | 0 refills | Status: DC | PRN

## 2021-06-18 MED ORDER — DICLOFENAC SODIUM 1 % EX GEL: 2.0000 g | Freq: Four times a day (QID) | CUTANEOUS | 2 refills | Status: DC

## 2021-06-18 NOTE — Assessment & Plan Note (Addendum)
Patient continues to have right lower quadrant abdominal pain and itching.  He also endorses constipation with a bowel movement 2-3 times a week.  CT abdomen was recently done per IR and showed possibly some ischemic changes, and the lesion and cholelithiasis without cholecystitis.  Per chart review he has a IR procedure scheduled for 9/13 for follow-up of left liver lobe cryoablation.    Plan: -Follow-up CMP, CBC PT and INR -Lactulose 10 g packet up to 2 times daily as needed - IR procedure 9/13 to follow-up left liver lobe cryo

## 2021-06-18 NOTE — Progress Notes (Signed)
   CC: Constipation, potassium, knee pain, eye discharge  HPI:  [name] is a 70 y.o. male with a past medical history listed below presenting for evaluation of constipation, right eye discharge, bilateral knee pain and potassium check. For details of today's visit and the status of his chronic medical issues please refer to the assessment and plan.   Past Medical History:  Diagnosis Date   Arthritis    knees   Cirrhosis ([place]) 08/2019   with ascites, varices   GERD (gastroesophageal reflux disease)    Hepatitis C    Genotype 1a   History of alcohol abuse    Hypotension    Hypotension    Neuromuscular disorder (HCC)    peropheral neuropathy   Review of Systems: Negative except as per assessment and plan  Physical Exam:  Vitals:   06/18/21 0900 06/18/21 1012  BP: 108/66   Pulse: 75 76  Resp:  20  Temp: 97.6 F (36.4 C)   TempSrc: Oral   SpO2: 91% 98%  Weight: 147 lb 9.6 oz (67 kg)   Height: 5' 5.5" (1.664 m)    Physical Exam General: alert, appears stated age, in no acute distress HEENT: Normocephalic, atraumatic, EOM intact, conjunctiva normal, no discharge appreciated CV: Regular rate and rhythm, no murmurs rubs or gallops Pulm: Clear to auscultation bilaterally, normal work of breathing Abdomen: Soft, nondistended, bowel sounds present, tender to deep palpation of the right lower quadrant MSK: No lower extremity edema Skin: Warm and dry Neuro: Alert and oriented x3   Assessment & Plan:   See Encounters Tab for problem based charting.  Patient discussed with Dr. Philipp Ovens

## 2021-06-18 NOTE — Telephone Encounter (Signed)
Donald Aguilar from [facility] called in stating they are not able to get in the lactulose packets. She is requesting Rx be sent for solution instead.

## 2021-06-18 NOTE — Assessment & Plan Note (Addendum)
Patient reports mucus-like discharge from the right eye.  Denies any eye pain, changes in his vision, redness, fevers, chills or recent illnesses.  Eye exam was unremarkable with no discharge appreciated.  Patient has an eye appt with [facility] on 10/3, will follow.

## 2021-06-18 NOTE — Addendum Note (Signed)
Addended by: Mike Craze on: 06/18/2021 03:47 PM   Modules accepted: Orders

## 2021-06-18 NOTE — Assessment & Plan Note (Signed)
Patient had hypokalemia in March and was treated with oral potassium, last refill in 5/22. His K was not rechecked since that time. Will check today to determine if he needs to continue oral supplementation.

## 2021-06-18 NOTE — Assessment & Plan Note (Signed)
Patient reports bilateral knee pain when he goes from sitting to standing position.  He states that he recently walked several miles and since that time his knees have been hurting.  Denies any pain at rest or when he is walking.  He has not tried any over-the-counter medications for this.  Denies any injuries or trauma to his knees.  On exam range of motion was normal.  Bilateral crepitus was appreciated with extension of bilateral knees.  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  Recommended conservative management with topical NSAIDs and knee strengthening exercises.

## 2021-06-18 NOTE — Assessment & Plan Note (Addendum)
Patient complains of longstanding constipation.  Has 2-3 bowel movements a week.  Knows he needs to drink more water.  States that he has not tried any over-the-counter remedies.  Discussed increasing water and fiber intake.  Plan: -Start lactulose 10 g packet up to 2 times daily as needed  ADDENDUM: Pharmacy requesting lactulose solution, prescription sent

## 2021-06-18 NOTE — Progress Notes (Signed)
Internal Medicine Clinic Attending ° °Case discussed with Dr. Rehman  At the time of the visit.  We reviewed the resident’s history and exam and pertinent patient test results.  I agree with the assessment, diagnosis, and plan of care documented in the resident’s note.  ° °

## 2021-06-18 NOTE — Patient Instructions (Addendum)
I will call you with any abnormal lab results.  For your constipation, I want you to take lactulose 10 g daily up to 2 times as needed.  For your knee pain, I have sent in a prescription for Voltaren gel.  You can use this as needed on your knees.    Journal for Nurse Practitioners, 15(4), [phone]. Retrieved July 26, 2018 from http://clinicalkey.com/nursing">  Knee Exercises Ask your health care provider which exercises are safe for you. Do exercises exactly as told by your health care provider and adjust them as directed. It is normal to feel mild stretching, pulling, tightness, or discomfort as you do these exercises. Stop right away if you feel sudden pain or your pain gets worse. Do not begin these exercises until told by your health care provider. Stretching and range-of-motion exercises These exercises warm up your muscles and joints and improve the movement and flexibility of your knee. These exercises also help to relieve pain andswelling. Knee extension, prone Lie on your abdomen (prone position) on a bed. Place your left / right knee just beyond the edge of the surface so your knee is not on the bed. You can put a towel under your left / right thigh just above your kneecap for comfort. Relax your leg muscles and allow gravity to straighten your knee (extension). You should feel a stretch behind your left / right knee. Hold this position for __________ seconds. Scoot up so your knee is supported between repetitions. Repeat __________ times. Complete this exercise __________ times a day. Knee flexion, active  Lie on your back with both legs straight. If this causes back discomfort, bend your left / right knee so your foot is flat on the floor. Slowly slide your left / right heel back toward your buttocks. Stop when you feel a gentle stretch in the front of your knee or thigh (flexion). Hold this position for __________ seconds. Slowly slide your left / right heel back to the  starting position. Repeat __________ times. Complete this exercise __________ times a day. Quadriceps stretch, prone  Lie on your abdomen on a firm surface, such as a bed or padded floor. Bend your left / right knee and hold your ankle. If you cannot reach your ankle or pant leg, loop a belt around your foot and grab the belt instead. Gently pull your heel toward your buttocks. Your knee should not slide out to the side. You should feel a stretch in the front of your thigh and knee (quadriceps). Hold this position for __________ seconds. Repeat __________ times. Complete this exercise __________ times a day. Hamstring, supine Lie on your back (supine position). Loop a belt or towel over the ball of your left / right foot. The ball of your foot is on the walking surface, right under your toes. Straighten your left / right knee and slowly pull on the belt to raise your leg until you feel a gentle stretch behind your knee (hamstring). Do not let your knee bend while you do this. Keep your other leg flat on the floor. Hold this position for __________ seconds. Repeat __________ times. Complete this exercise __________ times a day. Strengthening exercises These exercises build strength and endurance in your knee. Endurance is theability to use your muscles for a long time, even after they get tired. Quadriceps, isometric This exercise stretches the muscles in front of your thigh (quadriceps) without moving your knee joint (isometric). Lie on your back with your left / right leg extended and  your other knee bent. Put a rolled towel or small pillow under your knee if told by your health care provider. Slowly tense the muscles in the front of your left / right thigh. You should see your kneecap slide up toward your hip or see increased dimpling just above the knee. This motion will push the back of the knee toward the floor. For __________ seconds, hold the muscle as tight as you can without  increasing your pain. Relax the muscles slowly and completely. Repeat __________ times. Complete this exercise __________ times a day. Straight leg raises This exercise stretches the muscles in front of your thigh (quadriceps) and the muscles that move your hips (hip flexors). Lie on your back with your left / right leg extended and your other knee bent. Tense the muscles in the front of your left / right thigh. You should see your kneecap slide up or see increased dimpling just above the knee. Your thigh may even shake a bit. Keep these muscles tight as you raise your leg 4-6 inches (10-15 cm) off the floor. Do not let your knee bend. Hold this position for __________ seconds. Keep these muscles tense as you lower your leg. Relax your muscles slowly and completely after each repetition. Repeat __________ times. Complete this exercise __________ times a day. Hamstring, isometric Lie on your back on a firm surface. Bend your left / right knee about __________ degrees. Dig your left / right heel into the surface as if you are trying to pull it toward your buttocks. Tighten the muscles in the back of your thighs (hamstring) to "dig" as hard as you can without increasing any pain. Hold this position for __________ seconds. Release the tension gradually and allow your muscles to relax completely for __________ seconds after each repetition. Repeat __________ times. Complete this exercise __________ times a day. Hamstring curls If told by your health care provider, do this exercise while wearing ankle weights. Begin with __________ lb weights. Then increase the weight by 1 lb (0.5 kg) increments. Do not wear ankle weights that are more than __________ lb. Lie on your abdomen with your legs straight. Bend your left / right knee as far as you can without feeling pain. Keep your hips flat against the floor. Hold this position for __________ seconds. Slowly lower your leg to the starting  position. Repeat __________ times. Complete this exercise __________ times a day. Squats This exercise strengthens the muscles in front of your thigh and knee (quadriceps). Stand in front of a table, with your feet and knees pointing straight ahead. You may rest your hands on the table for balance but not for support. Slowly bend your knees and lower your hips like you are going to sit in a chair. Keep your weight over your heels, not over your toes. Keep your lower legs upright so they are parallel with the table legs. Do not let your hips go lower than your knees. Do not bend lower than told by your health care provider. If your knee pain increases, do not bend as low. Hold the squat position for __________ seconds. Slowly push with your legs to return to standing. Do not use your hands to pull yourself to standing. Repeat __________ times. Complete this exercise __________ times a day. Wall slides This exercise strengthens the muscles in front of your thigh and knee (quadriceps). Lean your back against a smooth wall or door, and walk your feet out 18-24 inches (46-61 cm) from it. Place your  feet hip-width apart. Slowly slide down the wall or door until your knees bend __________ degrees. Keep your knees over your heels, not over your toes. Keep your knees in line with your hips. Hold this position for __________ seconds. Repeat __________ times. Complete this exercise __________ times a day. Straight leg raises This exercise strengthens the muscles that rotate the leg at the hip and move it away from your body (hip abductors). Lie on your side with your left / right leg in the top position. Lie so your head, shoulder, knee, and hip line up. You may bend your bottom knee to help you keep your balance. Roll your hips slightly forward so your hips are stacked directly over each other and your left / right knee is facing forward. Leading with your heel, lift your top leg 4-6 inches (10-15  cm). You should feel the muscles in your outer hip lifting. Do not let your foot drift forward. Do not let your knee roll toward the ceiling. Hold this position for __________ seconds. Slowly return your leg to the starting position. Let your muscles relax completely after each repetition. Repeat __________ times. Complete this exercise __________ times a day. Straight leg raises This exercise stretches the muscles that move your hips away from the front of the pelvis (hip extensors). Lie on your abdomen on a firm surface. You can put a pillow under your hips if that is more comfortable. Tense the muscles in your buttocks and lift your left / right leg about 4-6 inches (10-15 cm). Keep your knee straight as you lift your leg. Hold this position for __________ seconds. Slowly lower your leg to the starting position. Let your leg relax completely after each repetition. Repeat __________ times. Complete this exercise __________ times a day. This information is not intended to replace advice given to you by your health care provider. Make sure you discuss any questions you have with your healthcare provider. Document Revised: 07/27/2018 Document Reviewed: 07/27/2018 Elsevier Patient Education  2022 [employer].

## 2021-06-19 LAB — CBC WITH DIFFERENTIAL/PLATELET
Basophils Absolute: 0.1 10*3/uL (ref 0.0–0.2)
Basos: 1 %
EOS (ABSOLUTE): 0.4 10*3/uL (ref 0.0–0.4)
Eos: 6 %
Hematocrit: 40.2 % (ref 37.5–51.0)
Hemoglobin: 14 g/dL (ref 13.0–17.7)
Immature Grans (Abs): 0 10*3/uL (ref 0.0–0.1)
Immature Granulocytes: 0 %
Lymphocytes Absolute: 3 10*3/uL (ref 0.7–3.1)
Lymphs: 40 %
MCH: 31.7 pg (ref 26.6–33.0)
MCHC: 34.8 g/dL (ref 31.5–35.7)
MCV: 91 fL (ref 79–97)
Monocytes Absolute: 0.7 10*3/uL (ref 0.1–0.9)
Monocytes: 9 %
Neutrophils Absolute: 3.2 10*3/uL (ref 1.4–7.0)
Neutrophils: 44 %
Platelets: 176 10*3/uL (ref 150–450)
RBC: 4.41 x10E6/uL (ref 4.14–5.80)
RDW: 13.2 % (ref 11.6–15.4)
WBC: 7.5 10*3/uL (ref 3.4–10.8)

## 2021-06-19 LAB — CMP14 + ANION GAP
ALT: 19 IU/L (ref 0–44)
AST: 38 IU/L (ref 0–40)
Albumin/Globulin Ratio: 1.1 — ABNORMAL LOW (ref 1.2–2.2)
Albumin: 3.6 g/dL — ABNORMAL LOW (ref 3.8–4.8)
Alkaline Phosphatase: 103 IU/L (ref 44–121)
Anion Gap: 15 mmol/L (ref 10.0–18.0)
BUN/Creatinine Ratio: 9 — ABNORMAL LOW (ref 10–24)
BUN: 6 mg/dL — ABNORMAL LOW (ref 8–27)
Bilirubin Total: 1.5 mg/dL — ABNORMAL HIGH (ref 0.0–1.2)
CO2: 22 mmol/L (ref 20–29)
Calcium: 9 mg/dL (ref 8.6–10.2)
Chloride: 103 mmol/L (ref 96–106)
Creatinine, Ser: 0.69 mg/dL — ABNORMAL LOW (ref 0.76–1.27)
Globulin, Total: 3.3 g/dL (ref 1.5–4.5)
Glucose: 75 mg/dL (ref 65–99)
Potassium: 3.9 mmol/L (ref 3.5–5.2)
Sodium: 140 mmol/L (ref 134–144)
Total Protein: 6.9 g/dL (ref 6.0–8.5)
eGFR: 101 mL/min/{1.73_m2} (ref >59)

## 2021-06-19 NOTE — Congregational Nurse Program (Signed)
Home visit with interpreter [name] to follow-up on visit with PCP yesterday.  Patient has not picked up Voltaren gel and lactulose which were new prescriptions from yesterday but states he will get a ride from a friend and go today.  Reviewed instructions for these medicines.Continues to take Spironolactone and furosemide as ordered.  Reminded patient of appointment with Dr. Linus Salmons on 06/26/21 and that transportation will be requested through Medicaid.  [name] RN, Congregational Nurse [phone]

## 2021-06-26 ENCOUNTER — Ambulatory Visit (INDEPENDENT_AMBULATORY_CARE_PROVIDER_SITE_OTHER): Payer: Medicare Other | Admitting: Internal Medicine

## 2021-06-26 ENCOUNTER — Encounter: Payer: Self-pay | Admitting: Internal Medicine

## 2021-06-26 ENCOUNTER — Other Ambulatory Visit: Payer: Self-pay

## 2021-06-26 VITALS — BP 114/72 | HR 59 | Wt 144.0 lb

## 2021-06-26 DIAGNOSIS — K7469 Other cirrhosis of liver: Secondary | ICD-10-CM | POA: Diagnosis not present

## 2021-06-26 DIAGNOSIS — B182 Chronic viral hepatitis C: Secondary | ICD-10-CM | POA: Diagnosis present

## 2021-06-26 NOTE — Assessment & Plan Note (Signed)
Will check his SVR 12 now after treatment completion.  Can recheck the RNA level in 3-6 months.  Since he will have regular care from GI, I will defer to Dr. Loletha Carrow to repeat the HCV RNA at some point later this year (if today's level remains not detected).    rtc prn

## 2021-06-26 NOTE — Assessment & Plan Note (Signed)
He has follow up with IR and I reminded him of the appointment.

## 2021-06-26 NOTE — Progress Notes (Signed)
   Subjective:    Patient ID: Donald Aguilar, male    DOB: Sep 08, 1951, 70 y.o.   MRN: [id number]  HPI He is here for follow up of chronic hepatitis C He has decompensated liver disease with hepatocellular carcinoma and s/p CT-guided cryoablation of the left hepatic lobe in April 2022.  I saw him for his chronic hepatitis C and started [place] for 6 months, which he completed in June of this year.  It was a difficult to find out from him when he started but appears he had missed some doses since he did not finish until June.  His last HCV RNA level was < 15 and no new issues.  He continues to complain of itch around the previous procedure location.    Review of Systems  Constitutional:  Negative for fatigue.  Gastrointestinal:  Negative for diarrhea.  Skin:  Negative for rash.      Objective:   Physical Exam Eyes:     General: No scleral icterus. Pulmonary:     Effort: Pulmonary effort is normal.  Neurological:     Mental Status: He is alert.  Psychiatric:        Mood and Affect: Mood normal.          Assessment & Plan:

## 2021-06-26 NOTE — Addendum Note (Signed)
Addended by: Leatrice Jewels on: 06/26/2021 03:04 PM   Modules accepted: Orders

## 2021-06-28 LAB — HEPATITIS C RNA QUANTITATIVE
HCV Quantitative Log: <1.18 log IU/mL
HCV RNA, PCR, QN: <15 IU/mL

## 2021-07-02 ENCOUNTER — Encounter: Payer: Self-pay | Admitting: *Deleted

## 2021-07-02 ENCOUNTER — Ambulatory Visit
Admission: RE | Admit: 2021-07-02 | Discharge: 2021-07-02 | Disposition: A | Discharge status: Home / Self Care | Payer: Medicare Other | Source: Ambulatory Visit | Attending: Interventional Radiology | Admitting: Interventional Radiology

## 2021-07-02 DIAGNOSIS — C220 Liver cell carcinoma: Secondary | ICD-10-CM

## 2021-07-02 HISTORY — PX: IR RADIOLOGIST EVAL & MGMT: IMG5224

## 2021-07-02 NOTE — Progress Notes (Signed)
Patient ID: Donald Aguilar, male   DOB: 10/29/1950, 70 y.o.   MRN: [id number]       Chief Complaint: Patient was seen in consultation today for hepatocellular carcinoma, post cryoablation at the request of Mykeal Carrick  Referring Physician(s): Wilfrid Lund, MD  History of Present Illness: Donald Aguilar is a 70 y.o. 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 03/02/2020 CTA obtained for preop planning of TIPS, also demonstrated enhancing regions in segment 4 and segment 8 of the liver, nonspecific  05/11/2020 patient went elective uncomplicated TIPS creation with large-volume paracentesis.  This significantly controlled his abdominal ascites.  He has   not needed anymore paracentesis procedures. 08/16/2020 follow-up CT shows enlarging 1.2 cm segment 4 liver lesion arterial enhancing LiRADS 5 presumed hepatocellular carcinoma, and 2 smaller lesions in segment 8. 11/23/2020 follow-up CT reveals 2 cm left liver lesion LiRAD 5;  smaller lesions in segment 3 and 8 with some motion degradation   12/25/2020 MR confirms 2.1 cm LiRADS 5 lesion in the left lobe.  The 2 smaller right lobe lesions are degraded by motion. [id number] had uncomplicated left inguinal hernia repair with mesh. He is being assessed by [facility] for transplant. 02/06/2021 CT-guided core biopsy and cryoablation of medial left hepatic lobe lesion.   Surgical pathology positive for well-to moderately differentiated hepatocellular carcinoma.  06/06/2021 CT abdomen without and with contrast was performed. He presents today for follow-up. Today additional history obtained from the patient with the aid of interpreter, and epic chart.   Challenging social situation with limited resources and health literacy.  He denies any symptoms of hepatic encephalopathy.     He has  some persistent pain in his right mid abdomen which she relates to previous thoracentesis procedure, predates the ablation.  He is eating well.  No scrotal swelling.      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, varices   GERD (gastroesophageal reflux disease)    Hepatitis C    Genotype 1a   History of alcohol abuse    Hypotension    Hypotension    Neuromuscular disorder ([place])    peropheral neuropathy    Past Surgical History:  Procedure Laterality Date   BIOPSY  09/13/2019   Procedure: BIOPSY;  Surgeon: Ladene Artist, MD;  Location: [facility] ENDOSCOPY;  Service: Endoscopy;;   ESOPHAGOGASTRODUODENOSCOPY (EGD) WITH PROPOFOL N/A 09/13/2019   Procedure: ESOPHAGOGASTRODUODENOSCOPY (EGD) WITH PROPOFOL;  Surgeon: Ladene Artist, MD;  Location: [facility] ENDOSCOPY;  Service: Endoscopy;  Laterality: N/A;   INGUINAL HERNIA REPAIR Left 12/28/2020   Procedure: LEFT INGUINAL HERNIA REPAIR WITH MESH;  Surgeon: Coralie Keens, MD;  Location: [place];  Service: General;  Laterality: Left;  LMA   IR PARACENTESIS  09/12/2019   IR PARACENTESIS  09/29/2019   IR PARACENTESIS  10/19/2019   IR PARACENTESIS  11/02/2019   IR PARACENTESIS  11/10/2019   IR PARACENTESIS  11/22/2019   IR PARACENTESIS  12/06/2019   IR PARACENTESIS  12/16/2019   IR PARACENTESIS  12/23/2019   IR PARACENTESIS  12/30/2019   IR PARACENTESIS  01/06/2020   IR PARACENTESIS  01/16/2020   IR PARACENTESIS  02/01/2020   IR PARACENTESIS  02/08/2020   IR PARACENTESIS  02/28/2020   IR PARACENTESIS  03/23/2020   IR PARACENTESIS  04/17/2020   IR PARACENTESIS  05/02/2020   IR PARACENTESIS  05/11/2020   IR PARACENTESIS  08/23/2020   IR RADIOLOGIST EVAL & MGMT  03/20/2020   IR RADIOLOGIST EVAL & MGMT  06/26/2020   IR RADIOLOGIST EVAL & MGMT  01/09/2021   IR RADIOLOGIST EVAL & MGMT  03/06/2021   IR TIPS  05/11/2020   RADIOLOGY WITH ANESTHESIA N/A 05/11/2020   Procedure: IR WITH  ANESTHESIA TRASJUGULAR INTRAHEPATIC PORTOSYSTEMIC SHUNT;  Surgeon: Arne Cleveland, MD;  Location: [place];  Service: Radiology;  Laterality: N/A;   RADIOLOGY WITH ANESTHESIA N/A 02/06/2021   Procedure: CT CRYO ABLATION;  Surgeon: Arne Cleveland, MD;  Location: [facility] ORS;  Service: Radiology;  Laterality: N/A;    Allergies: Patient has no known allergies.  Medications: Prior to Admission medications   Medication Sig Start Date End Date Taking? Authorizing Provider  diclofenac Sodium (VOLTAREN) 1 % GEL Apply 2 g topically 4 (four) times daily. 06/18/21   Rehman, Areeg N, DO  diphenhydrAMINE-zinc acetate (BENADRYL) cream Apply 1 application topically 3 (three) times daily as needed for itching.    [provider]  furosemide (LASIX) 20 MG tablet Take 1 tablet (20 mg total) by mouth daily. 04/25/21   Atway, Rayann N, DO  lactulose (CHRONULAC) 10 GM/15ML solution Take 15 mLs (10 g total) by mouth 2 (two) times daily as needed for mild constipation. 06/18/21   Rehman, Areeg N, DO  Sofosbuvir-Velpatasvir (EPCLUSA) 400-100 MG TABS Take 1 tablet by mouth daily. Patient not taking: Reported on 06/26/2021 09/12/20   Thayer Headings, MD  spironolactone (ALDACTONE) 50 MG tablet Take 1 tablet (50 mg total) by mouth daily for 90 doses. 04/25/21 07/24/21  Dorethea Clan, DO     Family History  Problem Relation Age of Onset   Liver cancer Neg Hx    Colon cancer Neg Hx    Pancreatic cancer Neg Hx    Esophageal cancer Neg Hx     Social History   Socioeconomic History   Marital status: Single    Spouse name: Not on file   Number of children: Not on file   Years of education: Not on file   Highest education level: Not on file  Occupational History   Not on file  Tobacco Use   Smoking status: Every Day    Packs/day: 0.50    Years: 50.00    Pack years: 25.00    Types: Cigarettes   Smokeless tobacco: Never   Tobacco comments:    approximately a couple of packs a week  Vaping Use   Vaping Use:  Never used  Substance and Sexual Activity   Alcohol use: Not Currently    Comment: last drank in September, 2020   Drug use: Never   Sexual activity: Not on file  Other Topics Concern   Not on file  Social History Narrative   Leave with 3 friends   Smoke 2 cigarette  a day   Social Determinants of Health   Financial Resource Strain: Not on file  Food Insecurity: Not on file  Transportation Needs: Not on file  Physical Activity: Not on file  Stress: Not on file  Social Connections: Not on file    ECOG Status: 1 - Symptomatic but completely ambulatory  Review of Systems: A 12 point ROS discussed and pertinent positives are indicated in the HPI above.  All other systems are negative.  Review of Systems  Vital Signs: BP 106/63 (  BP Location: Right Arm)   Pulse 68   SpO2 98%   Physical Exam Constitutional: Oriented to person, place, and time.  He seems the most alert, positive, and talkative as I have seen from him.  Well-developed and well-nourished. No distress.   HENT:  Head: Normocephalic and atraumatic.  Eyes: Conjunctivae and EOM are normal. Right eye exhibits no discharge. Left eye exhibits no discharge. No scleral icterus.  Neck: No JVD present.  Pulmonary/Chest: Effort normal. No stridor. No respiratory distress.  Abdomen: soft, non distended Neurological:  alert and oriented to person, place, and time.  Skin: Skin is warm and dry.  not diaphoretic.  Psychiatric:   normal mood and affect.   behavior is normal. Judgment and thought content normal.       Imaging: CT LIVER ABD W/WO  Result Date: 06/07/2021 CLINICAL DATA:  Ablation of the liver in the past, RIGHT-sided abdominal pain and constipation by report in this 70 year old male. Also with previous tips procedure. EXAM: CT ABDOMEN WITHOUT AND WITH CONTRAST TECHNIQUE: Multidetector CT imaging of the abdomen was performed following the standard protocol before and following the bolus administration of  intravenous contrast. CONTRAST:  43m OMNIPAQUE IOHEXOL 350 MG/ML SOLN COMPARISON:  MRI of the abdomen of December 25, 2020. Also prior CT study from February 2022. FINDINGS: Lower chest: Lung bases without effusion or sign of consolidative changes. Hepatobiliary: Signs of hepatic ablation. Signs of cirrhosis with stigmata of portal hypertension including portosystemic collaterals as on the prior study. Observation 1: Ablated area in the superior aspect of the LEFT hepatic lobe, at the boundary of hepatic subsegment II and IV without signs of enhancement to indicate residual disease LR category TR nonviable. Observation 2: Area without arterial phase enhancement below this location showing washout on venous phase (image 24/11) LR category 3. Observation 3: Previously described area in hepatic subsegment VIII still with washout, adjacent to the cephalad margin of the tips measuring 13 mm, previously 10 mm and shown to have hypervascular enhancement on a study from October of 2021, categorized previously as LR category 5 for this reason. (Image 14/16). Observation 4: Ablation zone without signs of enhancement in hepatic subsegment IV (image 23/16) no visible abnormality on previous CT, area of abnormality visualized on ultrasound. No viable tumor in this location. Tips appears patent. Portal vein is patent. Splenic vein and SMV are patent. Hepatic arterial anatomy arises from a common trunk, SMA and celiac with common origin. Note that along the medial aspect of the ablated zone in hepatic subsegment II there is further prominence of the hepatic artery which was diminutive previously. On this very early phase there is early draining portal venous branch along the inter lobar fissure (image 26/6) the artery to this area is hypertrophied as well compared to the previous imaging study. Particularly when compared to the study of October of 2021. There is some motion degradation of images in this area on the current study.  Cholelithiasis without pericholecystic stranding. No LEFT hepatic biliary duct dilation. RIGHT hepatic biliary duct dilation along the margin of the tips extending into cephalad RIGHT hepatic lobe with slight increase over a series of priors, now mild-to-moderate. Pancreas: Normal, without mass, inflammation or ductal dilatation. Spleen: Spleen normal size and contour. Adrenals/Urinary Tract: Adrenal glands are normal. Symmetric renal enhancement. No hydronephrosis. LEFT lower pole calculus with similar appearance to prior imaging. No suspicious renal lesion. Stomach/Bowel: No acute gastrointestinal process to the extent evaluated. Vascular/Lymphatic: Common visceral trunk as described. There may be  a diminutive celiac origin which does not appear opacified perhaps LEFT gastric artery arising just above the SMA origin. Splenic and hepatic arterial supply arise from a single widely patent vessel just below this level. SMA also supplied by the same vessel. Other: No ascites. Musculoskeletal: Spinal degenerative changes without acute or destructive bone finding. IMPRESSION: 1. LR TR nonviable area in hepatic subsegment II with small arterial portal venous fistula. Focused ultrasound could be utilized for further assessment as warranted. 2. Treated area in the LEFT hepatic lobe medial segment without signs of disease. 3. Small LR 5 lesion along the apex of the tips is unchanged. 4. New lesion with washout, categorized on the basis of this study is LR 3 with the caveat that the timing for the arterial phase is early rather than late arterial and this may in fact represent a small LR 4 lesion. Consider short interval follow-up and specify liver only coverage to mitigate motion artifact. The patient may also benefit from translation during the exam to ensure breath holding is performed. 5. Cholelithiasis without pericholecystic stranding. 6. LEFT lower pole calculus. 7. Post tips procedure as before. 8. Aortic  atherosclerosis. These results were called by telephone at the time of interpretation on 06/07/2021 at 10:02 am to provider [facility] , who verbally acknowledged these results. Aortic Atherosclerosis (ICD10-I70.0). Electronically Signed   By: Zetta Bills M.D.   On: 06/07/2021 10:01    Labs:  CBC: Recent Labs    12/28/20 0631 01/30/21 1340 02/06/21 1021 06/18/21 1043  WBC 8.3 8.5 6.9 7.5  HGB 14.6 13.5 14.6 14.0  HCT 42.7 40.1 43.4 40.2  PLT 181 168 184 176    COAGS: Recent Labs    09/03/20 1641 12/28/20 0718 02/06/21 1021 06/18/21 1043  INR 1.2* 1.3* 1.0 1.3*    BMP: Recent Labs    08/09/20 1613 09/03/20 1641 10/03/20 1129 10/29/20 1612 12/18/20 1015 01/30/21 1340 02/06/21 1021 06/06/21 0925 06/18/21 1043  NA 136   < > 138   < > 136 139 137  --  140  K 3.6   < > 3.1*   < > 4.3 3.7 4.1  --  3.9  CL 101   < > 103   < > 103 107 107  --  103  CO2 23   < > 28   < > '27 24 23  '$ --  22  GLUCOSE 93   < > 113*   < > 77 103* 106*  --  75  BUN 11   < > 11   < > '13 12 11  '$ --  6*  CALCIUM 8.3*   < > 8.5*   < > 9.2 9.5 9.4  --  9.0  CREATININE 0.71*   < > 0.90   < > 0.88 0.94 0.79 0.80 0.69*  GFRNONAA 97  --  88  --  88 >60 >60  --   --   GFRAA 112  --  102  --  102  --   --   --   --    < > = values in this interval not displayed.    LIVER FUNCTION TESTS: Recent Labs    10/29/20 1612 12/18/20 1015 01/30/21 1340 02/06/21 1021 06/18/21 1043  BILITOT 1.2 1.6* 1.0 1.6* 1.5*  AST 29 35 41 46* 38  ALT '18 21 26 30 19  '$ ALKPHOS 76  --  86 85 103  PROT 7.7 7.6 7.4 8.0 6.9  ALBUMIN 3.5  --  3.5 3.7 3.6*    TUMOR MARKERS: Recent Labs    07/20/20 0925 10/29/20 1612  AFPTM 5.8 6.6*    Assessment and Plan:  Impression is that he is done very well post cryoablation of the left hepatic lobe hepatocellular carcinoma, with no evidence of residual or recurrent disease on CT.  I am not particular concerned about the arterioportal fistula at this point, we will keep an  eye on that, may even resolve spontaneously.  He will need continued surveillance for total of at least 5 years, especially given the nonspecific findings on most recent CT.  We will plan to get CT without and with contrast hepatic phase in about 6 months from his previous.  He is doing great with respect to the TIPS and control of his ascites.  Thank you for this interesting consult.  I greatly enjoyed meeting Donald Aguilar and look forward to participating in their care.  A copy of this report was sent to the requesting provider on this date.  Electronically Signed: Rickard Rhymes 07/02/2021, 11:58 AM   I spent a total of    25 Minutes in face to face in clinical consultation, greater than 50% of which was counseling/coordinating care for hepatocellular carcinoma, post cryoablation.

## 2021-07-03 ENCOUNTER — Other Ambulatory Visit: Payer: Self-pay

## 2021-07-03 NOTE — Progress Notes (Signed)
The proposed treatment discussed in conference is for discussion purpose only and is not a binding recommendation.  The patients have not been physically examined, or presented with their treatment options.  Therefore, final treatment plans cannot be decided.  

## 2021-07-03 NOTE — Congregational Nurse Program (Signed)
Home visit with interpreter [name].  Patient needed clarification on dosage and frequency of diclofenac gel for knees.  He had only used it once today so we shoed him how much and that he could use it 4 times a day.  Reminded him of eye doctor appointment with Dr. Gershon Crane on 07/20/2021 at 1:15 pm.  CN will check to see if they will request an interpreter.  [name] RN, Congregational Nurse [phone]

## 2021-07-22 NOTE — Congregational Nurse Program (Signed)
CN accompanied patient to eye doctor visit with Dr. Gershon Crane.  Exam was essentially normal in terms of medical problems.  Glaucoma pressures normal and although he has early stages of cataracts no treatment needed.  His refraction showed patient's vision could be greatly improved with glasses which were ordered today and should arrive in 3-4 weeks.  He was scheduled follow-up appointment in 1 year on 07/30/2022 at 1:25 pm.  [name] RN, Congregational Nurse [phone]

## 2021-07-24 NOTE — Congregational Nurse Program (Signed)
Home visit with interpreter [name] to summarize findings at eye doctor appointment with Dr. Gershon Crane on 07/22/2021.  Explained to patient that his eyes were healthy except for early cataracts which need to be rechecked in 1 year.  Showed pictures and educated regarding cataracts.  Also reiterated that it will be at least 3-4 weeks before glasses will be ready and that he will need to pay the $3 copay at that time.  [name] RN, Congregational Nurse [phone]

## 2021-08-28 NOTE — Congregational Nurse Program (Signed)
Accompanied patient to [facility]) to pick up glasses. [name] RN, Congregational Nurse [phone]

## 2021-08-31 ENCOUNTER — Other Ambulatory Visit: Payer: Self-pay | Admitting: Internal Medicine

## 2021-08-31 DIAGNOSIS — K7469 Other cirrhosis of liver: Secondary | ICD-10-CM

## 2021-09-02 NOTE — Telephone Encounter (Signed)
Last visit 06/18/2021 No future appt scheduled  Requested medication strengths are different from what chart reflects    Please review request for accuracy prior to filling and approve/deny rx as appropriate and advise if patient needs an appt to be seen. Regenia Skeeter, Zeah Germano Cassady11/14/202210:36 AM

## 2021-09-04 NOTE — Congregational Nurse Program (Signed)
Home visit following phone call from patient stating he needs medication refilled.  Phone call to [facility] who stated Dr. Had sent in new prescription on 09/02/2021.  Phone call to patient to advise that medication is ready for pick-up at no cost.  He stated he will pick it up on Friday 09/06/2021.  [name] RN, Congregational Nurse [phone]

## 2021-10-29 ENCOUNTER — Other Ambulatory Visit: Payer: Self-pay | Admitting: Gastroenterology

## 2021-10-30 ENCOUNTER — Other Ambulatory Visit: Payer: Self-pay | Admitting: Radiation Oncology

## 2021-11-07 ENCOUNTER — Other Ambulatory Visit: Payer: Self-pay

## 2021-11-07 ENCOUNTER — Ambulatory Visit (INDEPENDENT_AMBULATORY_CARE_PROVIDER_SITE_OTHER): Payer: Medicare Other | Admitting: Internal Medicine

## 2021-11-07 ENCOUNTER — Encounter: Payer: Self-pay | Admitting: Internal Medicine

## 2021-11-07 VITALS — BP 101/67 | HR 80 | Temp 98.1°F | Ht 65.5 in | Wt 150.3 lb

## 2021-11-07 DIAGNOSIS — C220 Liver cell carcinoma: Secondary | ICD-10-CM

## 2021-11-07 DIAGNOSIS — Z23 Encounter for immunization: Secondary | ICD-10-CM

## 2021-11-07 DIAGNOSIS — K7469 Other cirrhosis of liver: Secondary | ICD-10-CM | POA: Diagnosis present

## 2021-11-07 DIAGNOSIS — Z0000 Encounter for general adult medical examination without abnormal findings: Secondary | ICD-10-CM

## 2021-11-07 DIAGNOSIS — E876 Hypokalemia: Secondary | ICD-10-CM

## 2021-11-07 DIAGNOSIS — K0889 Other specified disorders of teeth and supporting structures: Secondary | ICD-10-CM

## 2021-11-07 MED ORDER — SPIRONOLACTONE 50 MG PO TABS: 50.0000 mg | ORAL_TABLET | Freq: Every day | ORAL | 0 refills | Status: DC

## 2021-11-07 MED ORDER — FUROSEMIDE 20 MG PO TABS: 20.0000 mg | ORAL_TABLET | Freq: Every day | ORAL | 2 refills | Status: DC

## 2021-11-07 NOTE — Assessment & Plan Note (Addendum)
Patient has been following with IR status post TIPS procedure as well as cryoablation in 2022 for hepatocellular carcinoma.  Recent CT from 9/23 shows no signs of recurrent disease.  Previously was following with Dr. Diagnosed with GI however reports that he is no longer following with him.  Discussed with the patient that he likely needs to continue following with GI given extent of his liver disease and history.  Will reach out to Dr. Shann Medal office to set up a follow-up appointment for patient.  He has been tolerating oral intake, denies any constipation and no signs or symptoms of ascites.

## 2021-11-07 NOTE — Assessment & Plan Note (Signed)
Patient had hypokalemia last year and has been treated with oral potassium, 20 mill equivalents.  States he has been taking this for about 2 months.  Most recent potassium level was in August of last year, normal at that time.  We will recheck today.  He is on furosemide and spironolactone as well.  Advised him to hold off on taking any more potassium supplementation until his CMP returns.  -Follow-up CMP

## 2021-11-07 NOTE — Assessment & Plan Note (Signed)
Patient reports tooth pain that has been going on for several months.  Pain appears to be around tooth #22.  No signs or symptoms of infection.  No cervical lymphadenopathy.  Patient denies any fevers.  -Referred to dentist

## 2021-11-07 NOTE — Progress Notes (Signed)
° °  CC: Hypokalemia  HPI:  [name] is a 71 y.o. with a past medical history listed below presenting for evaluation of his hypokalemia, refills and tooth pain. For details of today's visit and the status of his chronic medical issues please refer to the assessment and plan.   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  Respiratory:  Negative for shortness of breath.   Cardiovascular:  Negative for chest pain and leg swelling.  Gastrointestinal:  Negative for abdominal pain, blood in stool, constipation, diarrhea, nausea and vomiting.    Physical Exam:  Vitals:   11/07/21 0844  BP: 101/67  Pulse: 80  Temp: 98.1 F (36.7 C)  TempSrc: Oral  SpO2: 97%  Weight: 150 lb 4.8 oz (68.2 kg)  Height: 5' 5.5" (1.664 m)    Physical Exam General: alert, appears stated age, in no acute distress HEENT: Normocephalic, atraumatic, EOM intact, conjunctiva normal CV: Regular rate and rhythm, no murmurs rubs or gallops Pulm: Clear to auscultation bilaterally, normal work of breathing Abdomen: Soft, nondistended, bowel sounds present, no tenderness to palpation MSK: No lower extremity edema Skin: Warm and dry Neuro: Alert and oriented x3   Assessment & Plan:   See Encounters Tab for problem based charting.  Patient discussed with Dr.  Cain Sieve

## 2021-11-07 NOTE — Progress Notes (Signed)
Internal Medicine Clinic Attending ° °Case discussed with Dr. Rehman  At the time of the visit.  We reviewed the resident’s history and exam and pertinent patient test results.  I agree with the assessment, diagnosis, and plan of care documented in the resident’s note.  ° °

## 2021-11-07 NOTE — Assessment & Plan Note (Signed)
Patient last saw IR in September 2023 with no signs of recurrence of disease.  He seems to have responded really well to cryoablation therapy in 2022.  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  Recommend 5-year surveillance.

## 2021-11-07 NOTE — Assessment & Plan Note (Signed)
Patient received Tdap and flu vaccinations today.

## 2021-11-07 NOTE — Patient Instructions (Addendum)
You received the flu and Tdap/tetanus vaccinations today  I am checking your potassium and liver function today.  I will call you with the results of this.  In the meantime you can stop taking potassium supplements.  I have sent in your refills.  I have also sent in a referral to the dentist.

## 2021-11-08 LAB — CMP14 + ANION GAP
ALT: 22 IU/L (ref 0–44)
AST: 40 IU/L (ref 0–40)
Albumin/Globulin Ratio: 1 — ABNORMAL LOW (ref 1.2–2.2)
Albumin: 3.8 g/dL (ref 3.8–4.8)
Alkaline Phosphatase: 118 IU/L (ref 44–121)
Anion Gap: 13 mmol/L (ref 10.0–18.0)
BUN/Creatinine Ratio: 10 (ref 10–24)
BUN: 8 mg/dL (ref 8–27)
Bilirubin Total: 1.6 mg/dL — ABNORMAL HIGH (ref 0.0–1.2)
CO2: 22 mmol/L (ref 20–29)
Calcium: 9 mg/dL (ref 8.6–10.2)
Chloride: 106 mmol/L (ref 96–106)
Creatinine, Ser: 0.81 mg/dL (ref 0.76–1.27)
Globulin, Total: 3.8 g/dL (ref 1.5–4.5)
Glucose: 58 mg/dL — ABNORMAL LOW (ref 70–99)
Potassium: 4.4 mmol/L (ref 3.5–5.2)
Sodium: 141 mmol/L (ref 134–144)
Total Protein: 7.6 g/dL (ref 6.0–8.5)
eGFR: 96 mL/min/{1.73_m2} (ref >59)

## 2021-11-27 NOTE — Congregational Nurse Program (Signed)
Home visit with interpreter [name] to inform patient that he has a dental appointment on February 23,2023 at 2:00 pm with Dr. Iantha Fallen.  [name] RN, Congregational Nurse [phone]

## 2021-12-04 ENCOUNTER — Other Ambulatory Visit: Payer: Self-pay | Admitting: Interventional Radiology

## 2021-12-04 DIAGNOSIS — C220 Liver cell carcinoma: Secondary | ICD-10-CM

## 2021-12-11 ENCOUNTER — Other Ambulatory Visit: Payer: Self-pay

## 2021-12-11 DIAGNOSIS — C220 Liver cell carcinoma: Secondary | ICD-10-CM

## 2021-12-24 NOTE — Congregational Nurse Program (Signed)
Accompanied patient to dental appointment with Dr. Iantha Fallen.  He underwent extraction of left lower tooth (#22) per Dr. Laural Golden note of 11/07/2021.  Post extraction Instructions reviewed.  Interpreter [name] and CN will home visit tomorrow to check on him. Return appointment scheduled for 01/14/2022 for cleaning.  [name] RN, Congregational Nurse [phone] ?

## 2021-12-25 NOTE — Congregational Nurse Program (Signed)
Home visit with interpreter [name].  Patient states he is pain free this afternoon following tooth extraction yesterday.  He had minimal bleeding when he removed gauze and has been eating soft foods.  He took 2 tylenol yesterday afternoon but has not needed any further pain medication . Reminded him of appointment for C-T scan on January 03, 2022 at 8:15 am and to not eat or drink for 4 hours prior to appointment. [name] RN, Congregational Nurse [phone]. ?

## 2021-12-28 ENCOUNTER — Other Ambulatory Visit: Payer: Self-pay | Admitting: Gastroenterology

## 2022-01-01 ENCOUNTER — Telehealth: Payer: Self-pay

## 2022-01-01 NOTE — Telephone Encounter (Signed)
Phone call from patient stating he is out of spironolactone.  CN called [facility].  They have new prescription on file and patient can pick it up after 11:00 am tomorrow.  Called patient back to let him know.  [name] RN, Congregational Nurse [phone] ?

## 2022-01-03 ENCOUNTER — Ambulatory Visit (HOSPITAL_COMMUNITY)
Admission: RE | Admit: 2022-01-03 | Discharge: 2022-01-03 | Disposition: A | Discharge status: Home / Self Care | Payer: Medicare Other | Source: Ambulatory Visit | Attending: Interventional Radiology | Admitting: Interventional Radiology

## 2022-01-03 ENCOUNTER — Other Ambulatory Visit: Payer: Self-pay

## 2022-01-03 DIAGNOSIS — C220 Liver cell carcinoma: Secondary | ICD-10-CM | POA: Insufficient documentation

## 2022-01-03 LAB — POCT I-STAT CREATININE: Creatinine, Ser: 0.7 mg/dL (ref 0.61–1.24)

## 2022-01-03 IMAGING — CT CT ABDOMEN WO/W CM
4 of 16 series · 11 of 46 positions shown, 16 images · IV contrast (agent unspecified)
Comparison: [DATE].

CLINICAL DATA: Status post left hepatic cryoablation for HCC. *
Tracking Code: BO *

EXAM:
CT ABDOMEN WITHOUT AND WITH CONTRAST
TECHNIQUE: Multidetector CT imaging of the abdomen was performed following the
standard protocol before and following the bolus administration of
intravenous contrast.

[Series 2: axial pre · axial · non-contrast · 0.85mm/px · z∈[+1105,+1325]mm · 3 of 45 slices shown, 7 images]
[im 1/45  soft-tissue]
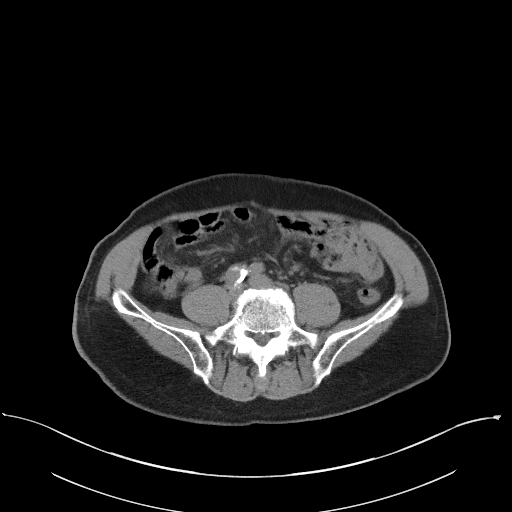
[im 1/45  lung]
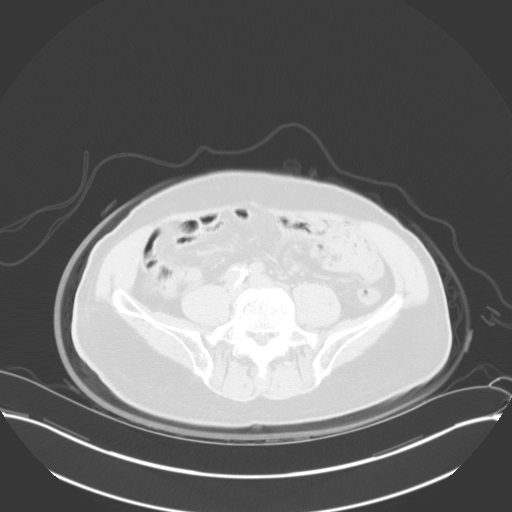
[im 1/45  bone]
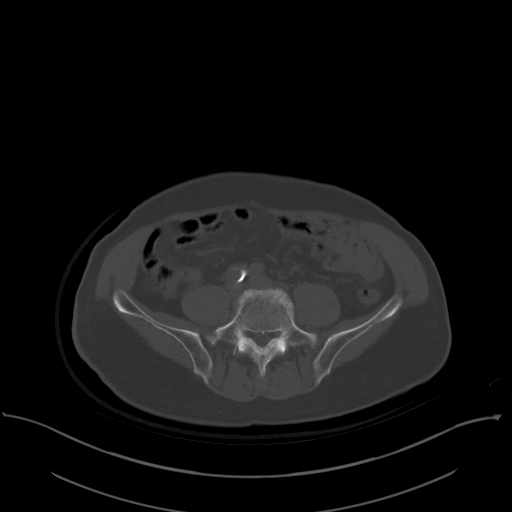
[im 23/45  soft-tissue]
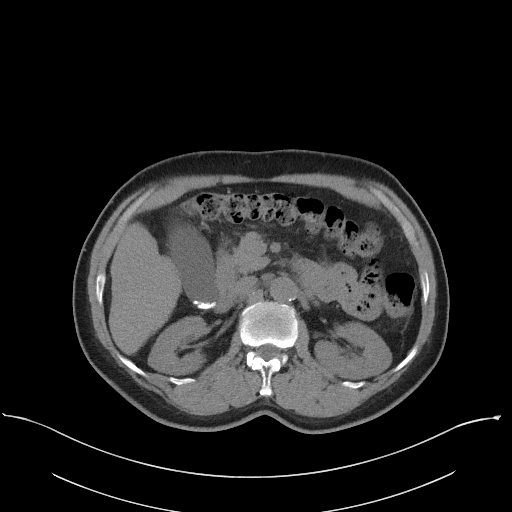
[im 23/45  lung]
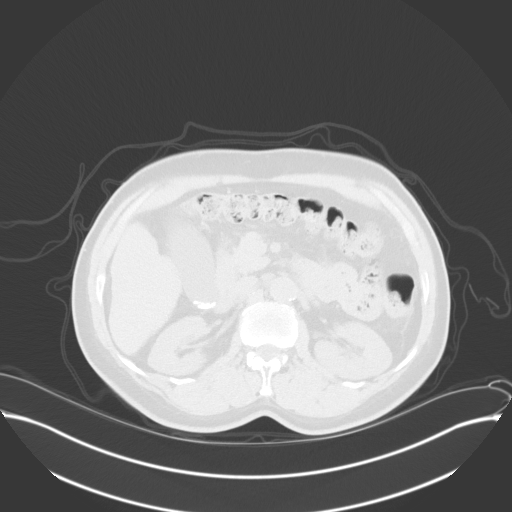
[im 45/45  soft-tissue]
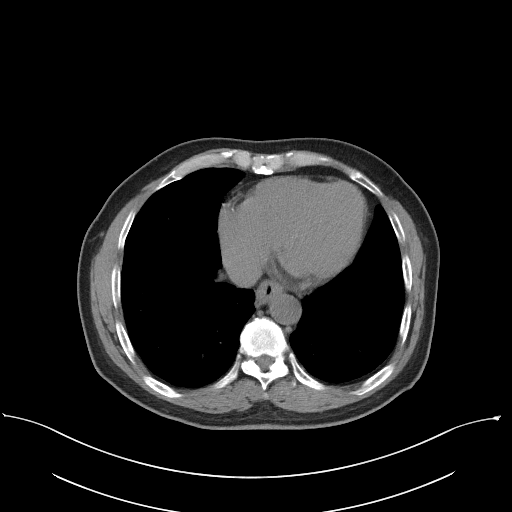
[im 45/45  lung]
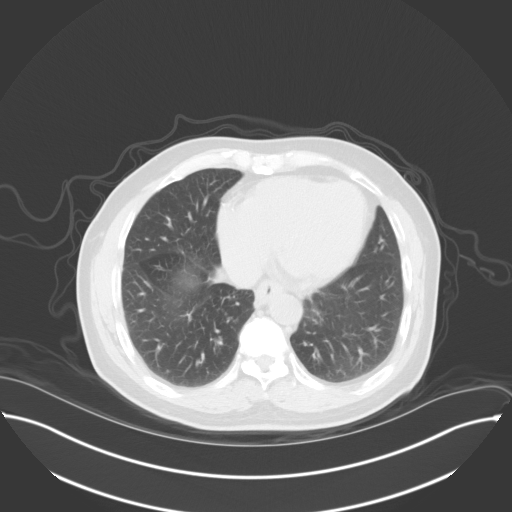

[Series 6: axial arterial · axial · arterial · 0.70mm/px · z∈[+1148,+1274]mm · 4 of 71 slices shown]
[im 15/71  soft-tissue]
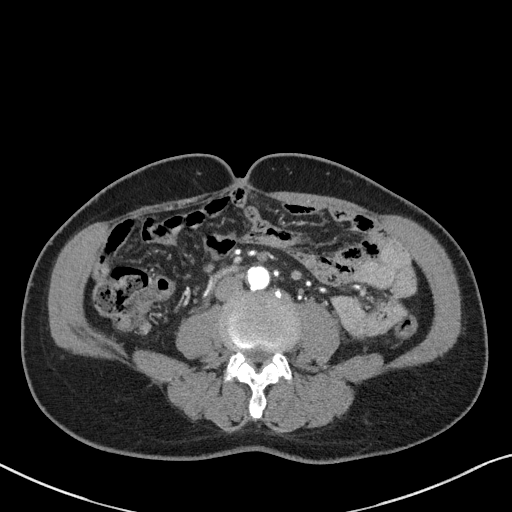
[im 29/71  soft-tissue]
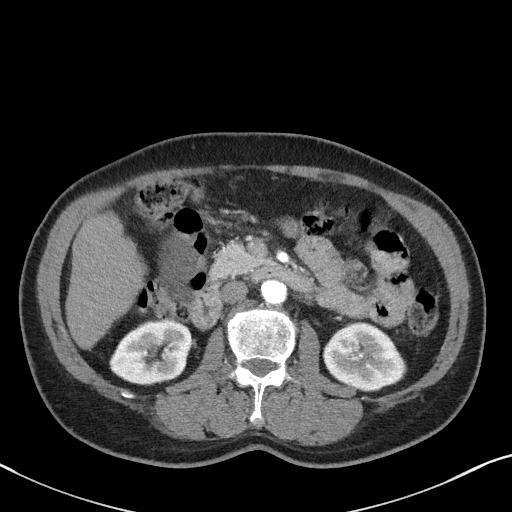
[im 43/71  soft-tissue]
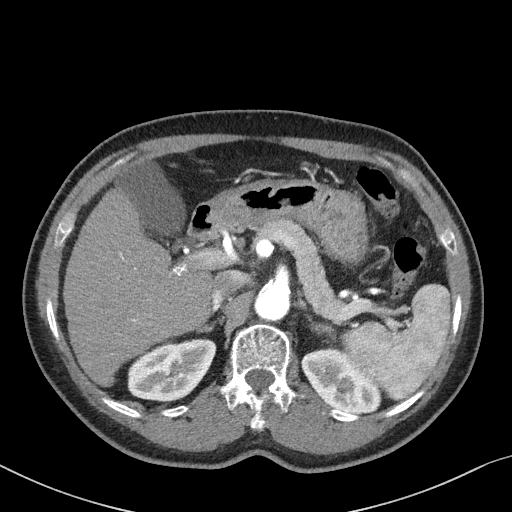
[im 57/71  soft-tissue]
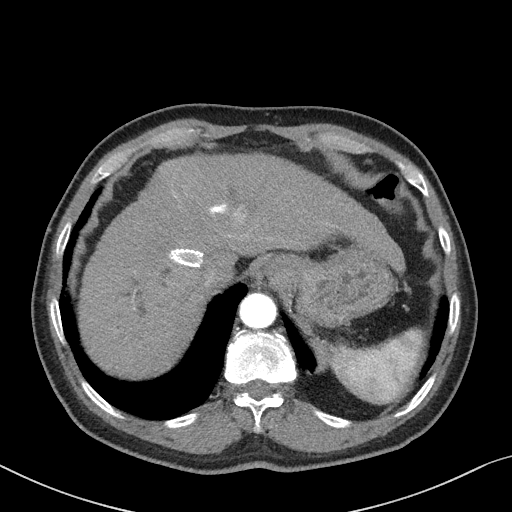

[Series 9: coronal arterial · coronal · arterial · 0.43mm/px · 1 of 88 slices shown, 2 images]
[im 44/88  soft-tissue]
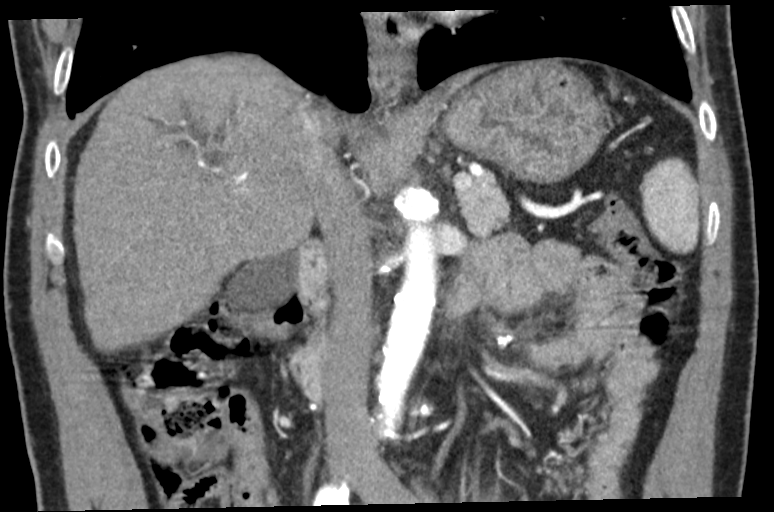
[im 44/88  bone]
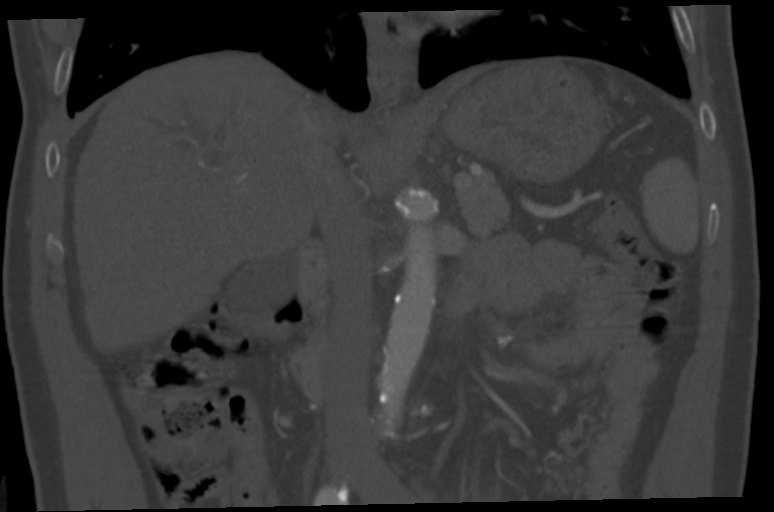

[Series 11: axial nephro · axial · 0.70mm/px · z∈[+1147,+1234]mm · 3 of 73 slices shown]
[im 15/73  soft-tissue]
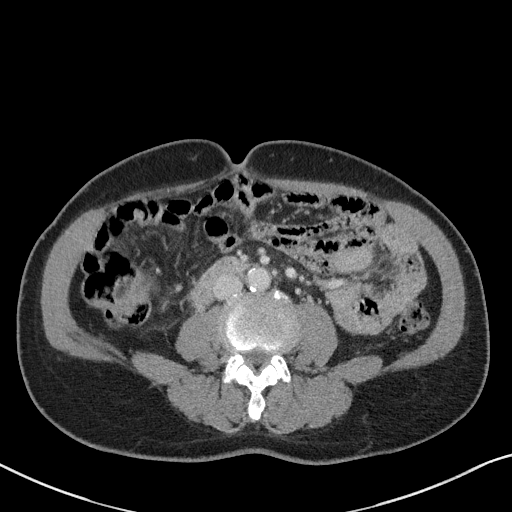
[im 29/73  soft-tissue]
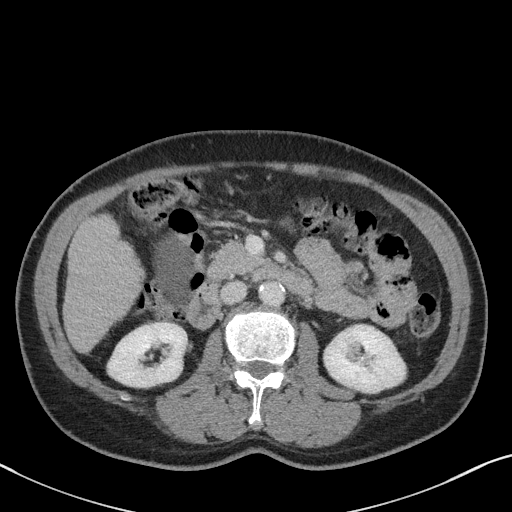
[im 44/73  soft-tissue]
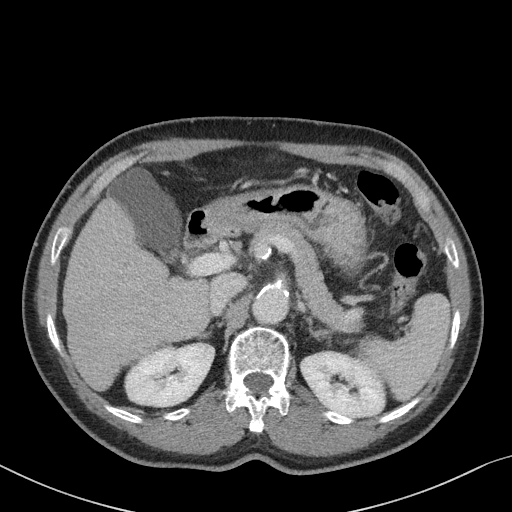

[11 of 46 positions shown; findings below may reference images not displayed]

RADIATION DOSE REDUCTION: This exam was performed according to the
departmental dose-optimization program which includes automated
exposure control, adjustment of the mA and/or kV according to
patient size and/or use of iterative reconstruction technique.

CONTRAST:  100mL OMNIPAQUE IOHEXOL 300 MG/ML  SOLN
FINDINGS: Lower chest: Posterior left diaphragmatic hernia again noted
containing only fat.

Hepatobiliary: Cirrhotic liver morphology again noted with
portosystemic shunt in situ.

Observation 1: A new focus of arterial phase hyperenhancement is
identified along the posterior-inferior margin of the lateral
segment left liver ablation defect (image 16/series 6). This lesion
shows rapid washout and on delayed imaging hypo enhances relative to
background liver parenchyma although there is no persistent
hypervascular rim. Given this lesion was not visible on the prior
study and is not an early post treatment finding, and lesion is
considered LR TR viable.

Observation 2: There is a similar immediately adjacent focus of
hyperenhancement more inferior and posterior to the above described
lesion measuring 1.7 x 1.6 cm (image [DATE]) with the same enhancement
characteristics. This represents the lesion showing washout on the
previous study without arterial phase hyperenhancement at that time.
This could represent a second discrete lesion or an extension of the
first lesion creating a "dumbell" or bilobed appearance. If this is
considered a new lesion with arterial phase non rim hyperenhancement
and non peripheral washout, lesion would be considered LR 4.

Observation 3: Previous described area in segment VIII along the
cranial end of the tips shunt demonstrating washout on previous
studies is probably visible on image 11 of series 11 today although
the most delayed series on the current study does not include this
portion of the upper liver. No hyperenhancement on arterial phase
imaging. Lesion cannot be definitively characterized today.

Observation 4:. Previously described ablation zone in the inferior
segment IV is not visible on today's exam.

Observation 5: New 9 mm focus of arterial phase hyperenhancement
identified in the inferior right liver (segment V) on image
40/series 6. This lesion shows subtle washout on delayed imaging but
no persistent enhancing capsule. LR 4.

Intrahepatic biliary duct dilatation is similar to prior. Tiny
layering calcified gallstones again noted.

Pancreas: No focal mass lesion. No dilatation of the main duct. No
intraparenchymal cyst. No peripancreatic edema.

Spleen: No splenomegaly. No focal mass lesion.

Adrenals/Urinary Tract: No adrenal nodule or mass. Tiny
nonobstructing stones inferior pole left kidney.

Stomach/Bowel: Stomach is unremarkable. No gastric wall thickening.
No evidence of outlet obstruction. Duodenum is normally positioned
as is the ligament of Treitz. No small bowel or colonic dilatation
within the visualized abdomen.

Vascular/Lymphatic: There is mild atherosclerotic calcification of
the abdominal aorta without aneurysm. There is no gastrohepatic or
hepatoduodenal ligament lymphadenopathy. No retroperitoneal or
mesenteric lymphadenopathy.

Other: No intraperitoneal free fluid.

Musculoskeletal: No worrisome lytic or sclerotic osseous
abnormality.
IMPRESSION: 1. 3 new areas of focal arterial phase hyperenhancement
demonstrating washout. 2 of these are compatible with LR 4 lesions.
1 of these is along the posterior and inferior margin of the lateral
segment left hepatic lobe ablation defect suggesting LR TR viable.
2. Small LR 5 lesion described previously at the along the apex of
the tips shunt is not included on all images of today's study, but
there is no arterial phase hyperenhancement visible in this location
today.
3. Cholelithiasis.
4. Tiny nonobstructing stones inferior left kidney.

## 2022-01-03 MED ORDER — IOHEXOL 300 MG/ML  SOLN
100.0000 mL | Freq: Once | INTRAMUSCULAR | Status: AC | PRN
  Administered 2022-01-03: 100 mL via INTRAVENOUS

## 2022-01-03 MED ORDER — SODIUM CHLORIDE (PF) 0.9 % IJ SOLN
INTRAMUSCULAR | Status: AC
  Filled 2022-01-03: qty 50

## 2022-01-10 ENCOUNTER — Other Ambulatory Visit: Payer: Self-pay | Admitting: Interventional Radiology

## 2022-01-10 ENCOUNTER — Ambulatory Visit
Admission: RE | Admit: 2022-01-10 | Discharge: 2022-01-10 | Disposition: A | Discharge status: Home / Self Care | Payer: Medicare Other | Source: Ambulatory Visit | Attending: Interventional Radiology | Admitting: Interventional Radiology

## 2022-01-10 ENCOUNTER — Encounter: Payer: Self-pay | Admitting: *Deleted

## 2022-01-10 DIAGNOSIS — C220 Liver cell carcinoma: Secondary | ICD-10-CM

## 2022-01-10 HISTORY — PX: IR RADIOLOGIST EVAL & MGMT: IMG5224

## 2022-01-10 NOTE — Progress Notes (Addendum)
Patient ID: Donald Aguilar, male   DOB: Feb 26, 1951, 71 y.o.   MRN: [id number] ?    ? ? ?Chief Complaint: ?Patient was seen in consultation today for hepatocellular carcinoma, post ablation, at the request of Roy Tokarz ? ?Referring Physician(s): ?Wilfrid Lund, MD ? ?History of Present Illness: ?Donald Aguilar is a 72 y.o. 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 ?03/02/2020 CTA obtained for preop planning of TIPS, also demonstrated enhancing regions in segment 4 and segment 8 of the liver, nonspecific ? 05/11/2020 patient went elective uncomplicated TIPS creation with large-volume paracentesis.  This significantly controlled his abdominal ascites.  He has   not needed anymore paracentesis procedures.  No further scrotal swelling/edema. ?08/16/2020 follow-up CT shows enlarging 1.2 cm segment 4 liver lesion arterial enhancing LiRADS 5 presumed hepatocellular carcinoma, and 2 smaller lesions in segment 8. ?11/23/2020 follow-up CT reveals 2 cm left liver lesion LiRAD 5;  smaller lesions in segment 3 and 8 with some motion degradation  ?12/25/2020 MR confirms 2.1 cm LiRADS 5 lesion in the left lobe.  The 2 smaller right lobe lesions are degraded by motion. ?2/59/5638 had uncomplicated left inguinal hernia repair with mesh. ?He is being assessed by [facility] for transplant. ?02/06/2021 CT-guided core biopsy and cryoablation of medial left hepatic lobe lesion. Surgical pathology positive for well-to moderately differentiated hepatocellular carcinoma. ?06/06/2021 CT abdomen showed ablation of the segment 2 lesion without residual disease, with an area inferior to the ablation zone showing venous phase washout characterized LR3.  Small LR 5 lesion suspected along the apex of the TIPS stent. ? ?He had a follow-up CT with contrast 01/03/2022. ?He presents  today for follow-up. ?Today additional history obtained from the patient with the aid of interpreter, and epic chart.   Challenging social situation with limited resources and health literacy.  He denies any symptoms of hepatic encephalopathy.     He has some persistent pain in his right mid abdomen which she relates to previous thoracentesis procedure, predates the ablation.  He is eating well.  No scrotal swelling. ?  ? ?Past Medical History:  ?Diagnosis Date  ? Arthritis   ? knees  ? Ascites 09/09/2019  ? 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 GERD (gastroesophageal reflux disease)   ? 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 History of alcohol abuse   ? Hypotension   ? Hypotension   ? Neuromuscular disorder ([place])   ? peropheral neuropathy  ? ? ?Past Surgical History:  ?Procedure Laterality Date  ? BIOPSY  09/13/2019  ? Procedure: BIOPSY;  Surgeon: Ladene Artist, MD;  Location: [facility] ENDOSCOPY;  Service: Endoscopy;;  ? ESOPHAGOGASTRODUODENOSCOPY (EGD) WITH PROPOFOL N/A 09/13/2019  ? Procedure: ESOPHAGOGASTRODUODENOSCOPY (EGD) WITH PROPOFOL;  Surgeon: Ladene Artist, MD;  Location: [facility] ENDOSCOPY;  Service: Endoscopy;  Laterality: N/A;  ? INGUINAL HERNIA REPAIR Left 12/28/2020  ? Procedure: LEFT INGUINAL HERNIA REPAIR WITH MESH;  Surgeon: Coralie Keens, MD;  Location: [place];  Service: General;  Laterality: Left;  LMA  ? IR PARACENTESIS  09/12/2019  ? IR PARACENTESIS  09/29/2019  ? IR PARACENTESIS  10/19/2019  ? IR PARACENTESIS  11/02/2019  ? IR PARACENTESIS  11/10/2019  ? IR PARACENTESIS  11/22/2019  ? IR PARACENTESIS  12/06/2019  ? IR PARACENTESIS  12/16/2019  ? IR PARACENTESIS  12/23/2019  ? IR PARACENTESIS  12/30/2019  ? IR PARACENTESIS  01/06/2020  ? IR PARACENTESIS  01/16/2020  ? IR PARACENTESIS  02/01/2020  ? IR PARACENTESIS  02/08/2020  ? IR PARACENTESIS  02/28/2020  ? IR PARACENTESIS  03/23/2020  ? IR PARACENTESIS  04/17/2020  ? IR PARACENTESIS  05/02/2020  ? IR PARACENTESIS  05/11/2020  ? IR PARACENTESIS  08/23/2020  ?  IR RADIOLOGIST EVAL & MGMT  03/20/2020  ? IR RADIOLOGIST EVAL & MGMT  06/26/2020  ? IR RADIOLOGIST EVAL & MGMT  01/09/2021  ? IR RADIOLOGIST EVAL & MGMT  03/06/2021  ? IR RADIOLOGIST EVAL & MGMT  07/02/2021  ? IR RADIOLOGIST EVAL & MGMT  01/10/2022  ? IR TIPS  05/11/2020  ? RADIOLOGY WITH ANESTHESIA N/A 05/11/2020  ? Procedure: IR WITH ANESTHESIA TRASJUGULAR INTRAHEPATIC PORTOSYSTEMIC SHUNT;  Surgeon: Arne Cleveland, MD;  Location: [place];  Service: Radiology;  Laterality: N/A;  ? RADIOLOGY WITH ANESTHESIA N/A 02/06/2021  ? Procedure: CT CRYO ABLATION;  Surgeon: Arne Cleveland, MD;  Location: [facility];  Service: Radiology;  Laterality: N/A;  ? ? ?Allergies: ?Patient has no known allergies. ? ?Medications: ?Prior to Admission medications   ?Medication Sig Start Date End Date Taking? Authorizing Provider  ?diclofenac Sodium (VOLTAREN) 1 % GEL Apply 2 g topically 4 (four) times daily. 06/18/21   Rehman, Areeg N, DO  ?diphenhydrAMINE-zinc acetate (BENADRYL) cream Apply 1 application topically 3 (three) times daily as needed for itching.    [provider]  ?furosemide (LASIX) 20 MG tablet Take 1 tablet (20 mg total) by mouth daily. 11/07/21   Rehman, Areeg N, DO  ?lactulose (CHRONULAC) 10 GM/15ML solution Take 15 mLs (10 g total) by mouth 2 (two) times daily as needed for mild constipation. 06/18/21   Rehman, Areeg N, DO  ?Sofosbuvir-Velpatasvir (EPCLUSA) 400-100 MG TABS Take 1 tablet by mouth daily. ?Patient not taking: Reported on 06/26/2021 09/12/20   Thayer Headings, MD  ?spironolactone (ALDACTONE) 50 MG tablet Take 1 tablet (50 mg total) by mouth daily. 11/07/21   Rehman, Areeg N, DO  ?  ? ?Family History  ?Problem Relation Age of Onset  ? Liver cancer Neg Hx   ? Colon cancer Neg Hx   ? Pancreatic cancer Neg Hx   ? Esophageal cancer Neg Hx   ? ? ?Social History  ? ?Socioeconomic History  ? Marital status: Single  ?  Spouse name: Not on file  ? Number of children: Not on file  ? Years of education: Not on file  ? Highest  education level: Not on file  ?Occupational History  ? Not on file  ?Tobacco Use  ? Smoking status: Every Day  ?  Packs/day: 0.50  ?  Years: 50.00  ?  Pack years: 25.00  ?  Types: Cigarettes  ? Smokeless tobacco: Never  ? Tobacco comments:  ?  approximately a couple of packs a week  ?Vaping Use  ? Vaping Use: Never used  ?Substance and Sexual Activity  ? Alcohol use: Not Currently  ?  Comment: last drank in September, 2020  ? Drug use: Never  ? Sexual activity: Not on file  ?Other Topics Concern  ? Not on file  ?Social History Narrative  ? Leave with 3 friends  ? Smoke 2 cigarette  a day  ? ?Social Determinants of Health  ? ?Financial Resource Strain: Not on file  ?Food Insecurity: Not on file  ?Transportation Needs: Not on file  ?Physical Activity: Not on file  ?Stress: Not on  file  ?Social Connections: Not on file  ? ? ?ECOG Status: ?1 - Symptomatic but completely ambulatory ? ?Review of Systems: A 12 point ROS discussed and pertinent positives are indicated in the HPI above.  All other systems are negative. ? ?Review of Systems ? ?Vital Signs: ?BP 111/65 (BP Location: Left Arm)   Pulse 77   SpO2 97%  ? ?Physical Exam ? ?Constitutional: Oriented to person, place, and time. Thin but well-nourished. No distress.  ? ?HENT:  ?Head: Normocephalic and atraumatic.  ?Eyes: Conjunctivae and EOM are normal. Right eye exhibits no discharge. Left eye exhibits no discharge. No scleral icterus.  ?Neck: No JVD present.  ?Pulmonary/Chest: Effort normal. No stridor. No respiratory distress.  ?Abdomen: soft, non distended, nontender ?Neurological:  alert and oriented to person, place, and time.  ?Skin: Skin is warm and dry.  not diaphoretic.  ?Psychiatric:   normal mood and affect.   behavior is normal. Judgment and thought content normal.  ? ? ?Imaging: ?CT ABDOMEN W WO CONTRAST ? ?Result Date: 01/04/2022 ?CLINICAL DATA:  Status post left hepatic cryoablation for HCC. * Tracking Code: BO * EXAM: CT ABDOMEN WITHOUT AND WITH  CONTRAST TECHNIQUE: Multidetector CT imaging of the abdomen was performed following the standard protocol before and following the bolus administration of intravenous contrast. RADIATION DOSE REDUCTION: This exam

## 2022-01-15 NOTE — Congregational Nurse Program (Signed)
Home visit with interpreter [name].  Patient went to dental appointment yesterday with Dr. Bonnee Quin but left due to confusion over paperwork and appointment time.  Rescheduled for 02/04/2022 at 2:00 pm.  CN will accompany patient.  Reminded him of appointment for MRI at [facility] on 01/22/2022 and with Dr. Vernard Gambles on 01/28/2022.  CN will arrange Medicaid transportation.  [name] RN, Congregational Nurse [phone].   ?

## 2022-01-22 ENCOUNTER — Ambulatory Visit (HOSPITAL_COMMUNITY)
Admission: RE | Admit: 2022-01-22 | Discharge: 2022-01-22 | Disposition: A | Discharge status: Home / Self Care | Payer: Medicare Other | Source: Ambulatory Visit | Attending: Interventional Radiology | Admitting: Interventional Radiology

## 2022-01-22 ENCOUNTER — Ambulatory Visit (HOSPITAL_COMMUNITY): Admission: RE | Admit: 2022-01-22 | Payer: Medicare Other | Source: Ambulatory Visit

## 2022-01-22 DIAGNOSIS — C220 Liver cell carcinoma: Secondary | ICD-10-CM | POA: Insufficient documentation

## 2022-01-22 IMAGING — MR MR ABDOMEN WO/W CM
18 series · 46 of 48 positions shown · IV contrast (7ML GADAVIST)
Comparison: CT abdomen, [DATE], [DATE], MR abdomen,
[DATE]

CLINICAL DATA: Hepatocellular carcinoma, status post left hepatic
lobe cryoablation, new hyperenhancing lesions identified by prior
CT, including suspicion for local viability about treated lesion

EXAM:
MRI ABDOMEN WITHOUT AND WITH CONTRAST
TECHNIQUE: Multiplanar multisequence MR imaging of the abdomen was performed
both before and after the administration of intravenous contrast.
CONTRAST:  7mL GADAVIST GADOBUTROL 1 MMOL/ML IV SOLN

[Series 3: T2 · coronal · 6.0mm · 1.56mm/px · 2 of 30 slices shown (1 of 2)]
[im 1/30]
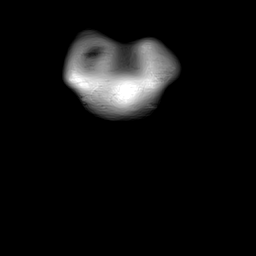
[im 30/30]
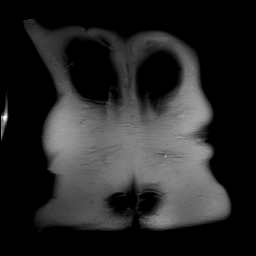

[Series 4: T1 · axial · 3.0mm · 1.25mm/px · z∈[-42,+171]mm · 4 of 72 slices shown (1 of 2)]
[im 1/72]
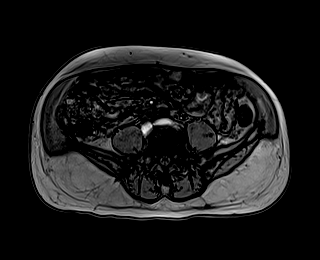
[im 24/72]
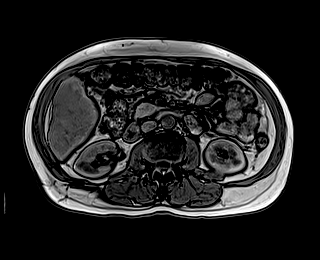
[im 48/72]
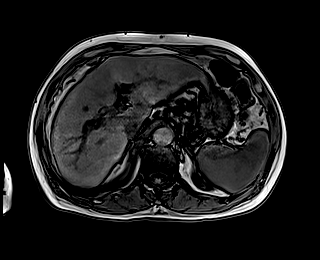
[im 72/72]
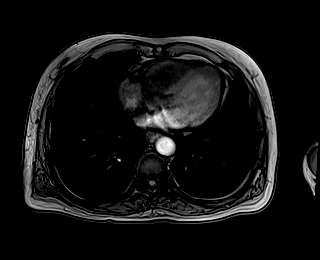

[Series 5: T1 · axial · 3.0mm · 1.25mm/px · z∈[-42,+171]mm · 4 of 72 slices shown (2 of 2)]
[im 1/72]
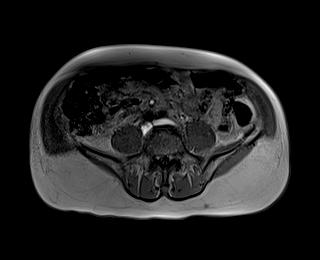
[im 24/72]
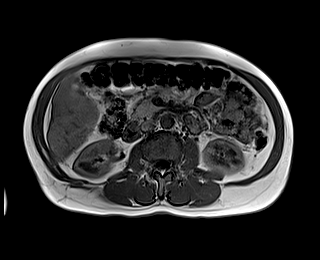
[im 48/72]
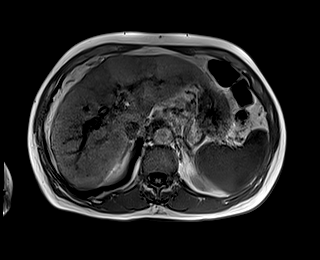
[im 72/72]
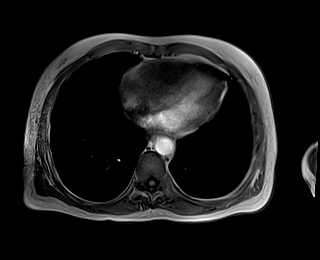

[Series 6: DWI · axial · 6.0mm · 1.36mm/px · z∈[-59,+193]mm · 3 of 72 slices shown (1 of 2)]
[im 1/72]
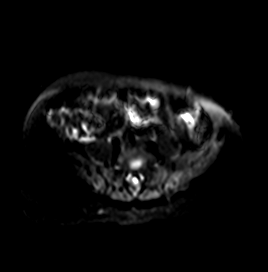
[im 36/72]
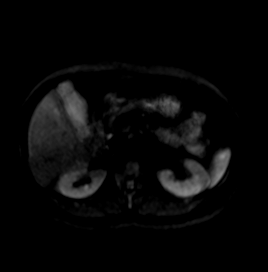
[im 72/72]
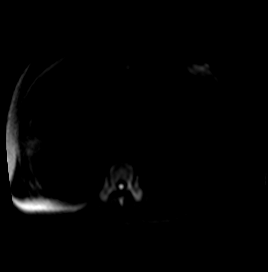

[Series 7: DWI · axial · 6.0mm · 1.36mm/px · 1 of 36 slices shown (2 of 2)]
[im 1/36]
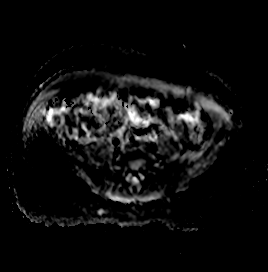

[Series 9: T2 fat-sat · axial · 6.0mm · 1.25mm/px · 1 of 36 slices shown]
[im 1/36]
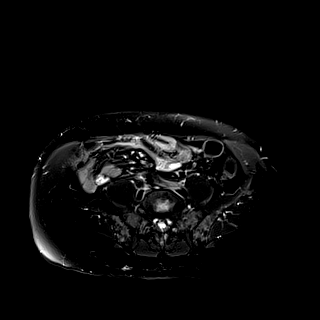

[Series 10: bSSFP · axial · 4.5mm · 0.84mm/px · z∈[-45,+175]mm · 2 of 50 slices shown]
[im 1/50]
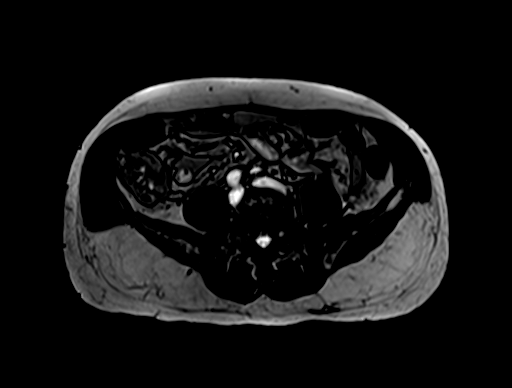
[im 50/50]
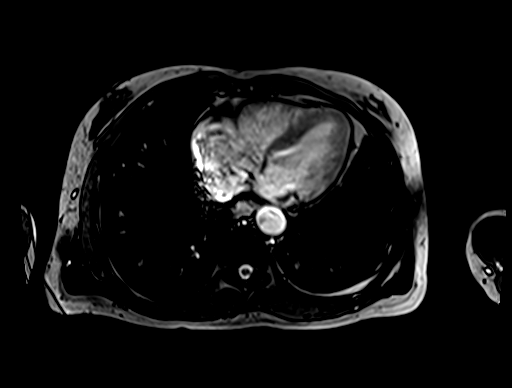

[Series 12: T1 dynamic · axial · 3.0mm · 1.25mm/px · z∈[-54,+183]mm · 3 of 80 slices shown (1 of 10)]
[im 1/80]
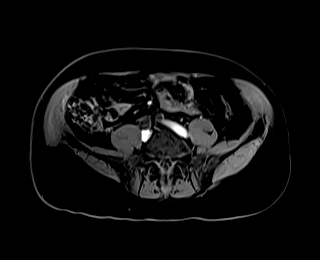
[im 40/80]
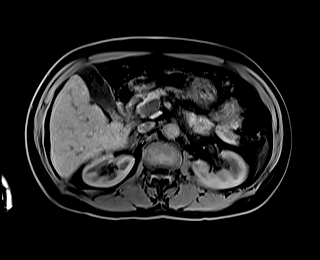
[im 80/80]
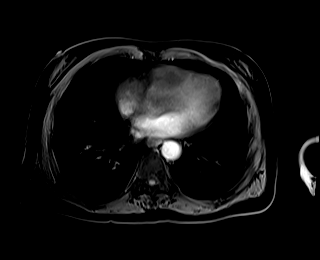

[Series 15: T1 dynamic · axial · 3.0mm · 1.25mm/px · z∈[-54,+183]mm · 3 of 80 slices shown (2 of 10)]
[im 1/80]
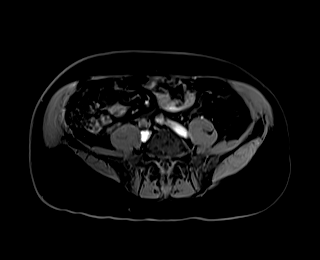
[im 40/80]
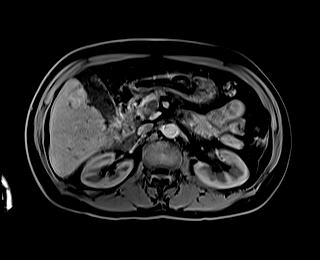
[im 80/80]
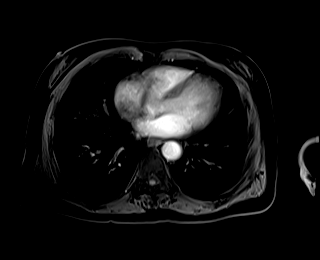

[Series 16: T1 dynamic · axial · 3.0mm · 1.25mm/px · z∈[-54,+183]mm · 3 of 80 slices shown (3 of 10)]
[im 1/80]
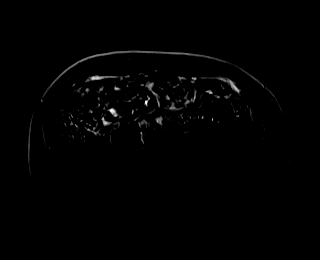
[im 40/80]
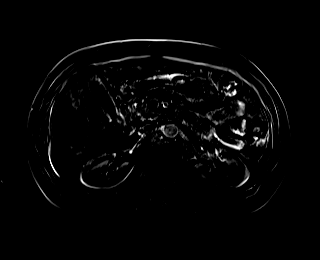
[im 80/80]
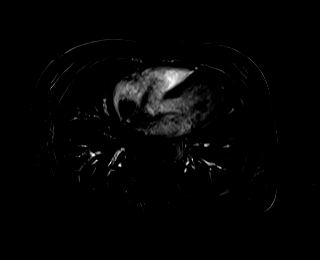

[Series 19: T1 dynamic · axial · 3.0mm · 1.25mm/px · z∈[-54,+183]mm · 3 of 80 slices shown (4 of 10)]
[im 1/80]
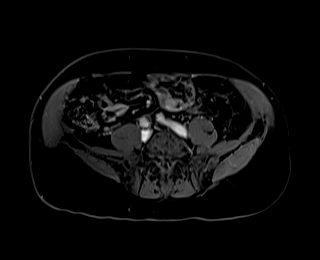
[im 40/80]
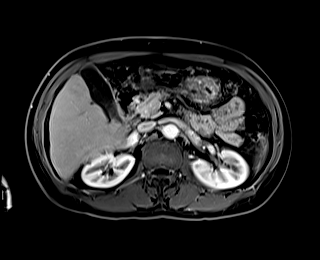
[im 80/80]
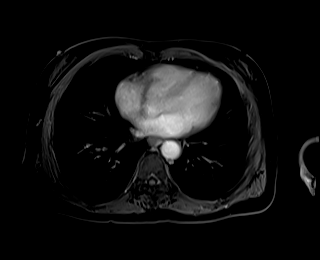

[Series 20: T1 dynamic · axial · 3.0mm · 1.25mm/px · z∈[-54,+183]mm · 3 of 80 slices shown (5 of 10)]
[im 1/80]
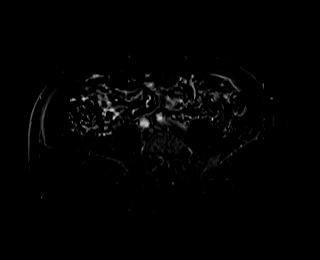
[im 40/80]
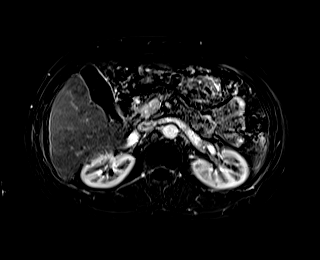
[im 80/80]
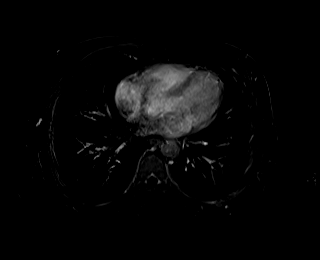

[Series 23: T1 dynamic · axial · 3.0mm · 1.25mm/px · z∈[-54,+183]mm · 3 of 80 slices shown (6 of 10)]
[im 1/80]
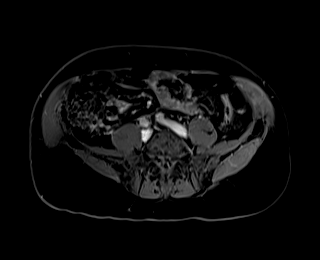
[im 40/80]
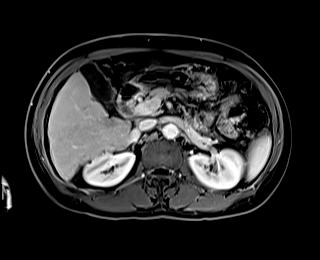
[im 80/80]
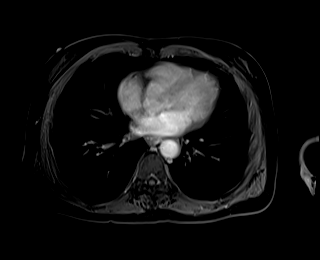

[Series 24: T1 dynamic · axial · 3.0mm · 1.25mm/px · z∈[-54,+183]mm · 3 of 80 slices shown (7 of 10)]
[im 1/80]
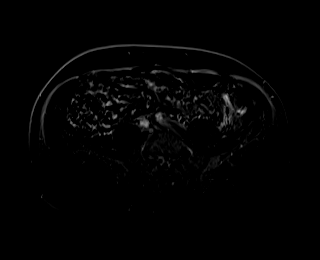
[im 40/80]
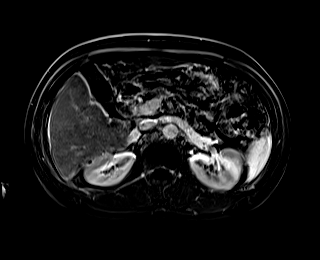
[im 80/80]
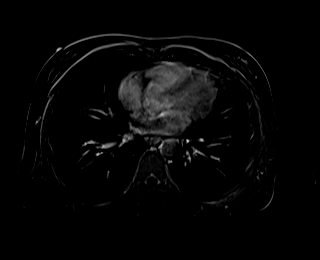

[Series 26: T1 dynamic · coronal · 3.0mm · 1.41mm/px · 3 of 72 slices shown (8 of 10)]
[im 1/72]
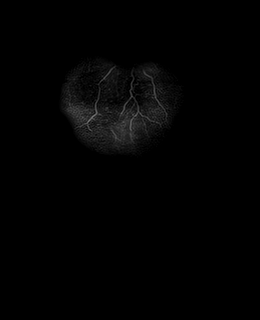
[im 36/72]
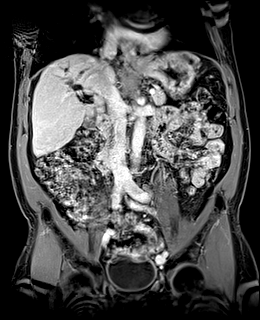
[im 72/72]
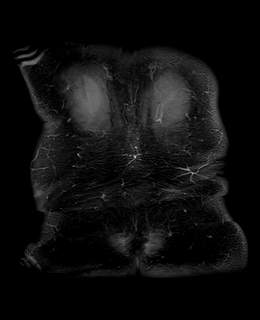

[Series 27: T2 · axial · 6.5mm · 1.56mm/px · 1 of 30 slices shown (2 of 2)]
[im 1/30]
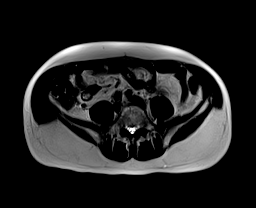

[Series 30: T1 dynamic · axial · 3.0mm · 1.25mm/px · z∈[-54,+183]mm · 3 of 80 slices shown (9 of 10)]
[im 1/80]
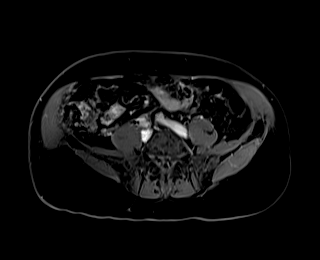
[im 40/80]
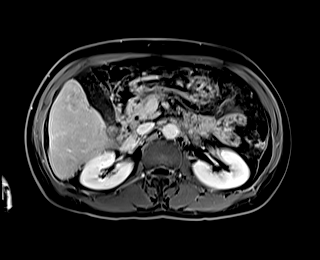
[im 80/80]
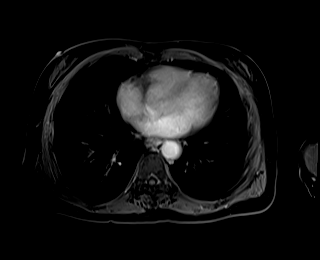

[Series 31: T1 dynamic · axial · 3.0mm · 1.25mm/px · 1 of 80 slices shown (10 of 10)]
[im 1/80]
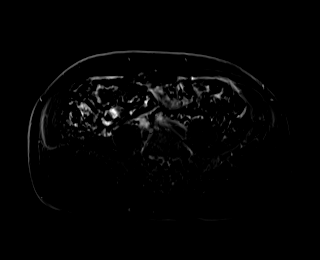

[46 of 48 positions shown; findings below may reference images not displayed]

FINDINGS: Lower chest: No acute findings.

Hepatobiliary: Coarse, nodular cirrhotic morphology of the liver.
Right hepatic vein TIPS. Unchanged segmental biliary ductal
dilatation in the liver dome, hepatic segment VII and VIII (series
9, image 8). Redemonstrated ablation defect in the superior left
lobe of the liver, hepatic segment II (series 19, image 19). Early
arterial phase provided for review is earlier bolus timing than
optimal, which somewhat limits evaluation. Examination is further
somewhat limited by breath motion artifact. Within this limitation,
there is subtle hyperenhancement at the inferior margin of the
ablation site, extending into hepatic segment III, the most clearly
visualized component measuring approximately 1.8 x 1.6 cm (series
19, image 26). Subtle evidence of washout on delayed phase imaging
(series 30, image 25) without evidence of capsule. Arterially
hyperenhancing lesion of the inferior right lobe of the liver near
the gallbladder fossa, hepatic segment V, with evidence of washout
but without capsular enhancement, measuring 1.1 x 1.1 cm (series 19,
image 51). A previously described lesion in the vicinity of the
superior TIPS, hepatic segment VIII, is not appreciated on current
examination (series 19, image 20). No gallstones. No biliary ductal
dilatation.

Pancreas: No mass, inflammatory changes, or other parenchymal
abnormality identified.No pancreatic ductal dilatation.

Spleen:  Within normal limits in size and appearance.

Adrenals/Urinary Tract: Normal adrenal glands. No renal masses or
suspicious contrast enhancement identified. No evidence of
hydronephrosis.

Stomach/Bowel: Large burden of stool in the included colon.
Visualized portions within the abdomen are otherwise unremarkable.

Vascular/Lymphatic: No pathologically enlarged lymph nodes
identified. No abdominal aortic aneurysm demonstrated.

Other:  None.

Musculoskeletal: No suspicious osseous lesions identified.
IMPRESSION: 1. Redemonstrated ablation defect in the superior left lobe of the
liver, hepatic segment II. Subtle hyperenhancement at the inferior
margin of the ablation site, extending into hepatic segment III, the
most clearly visualized component measuring approximately 1.8 x
cm. Subtle evidence of washout on delayed phase imaging without
evidence of capsule. Findings are suspicious for residual or
recurrent tumor versus incidental adjacent metachronous
hepatocellular carcinoma, PRESA category TR viable. Note that this
finding was described as two possibly distinct lesions by prior CT
but appears to be a single contiguous lesion by MR.
2. Arterially hyperenhancing lesion of the inferior right lobe of
the liver near the gallbladder fossa, hepatic segment V, measuring
1.1 x 1.1 cm, with evidence of washout but without capsular
enhancement. As measured by MR, this is PRESA category 5 lesion,
consistent with hepatocellular carcinoma.
3. A previously described lesion in the vicinity of the superior
TIPS, hepatic segment VIII, is not appreciated on current
examination.
4. Given technical limitations of current examination and generally
subtle character of these lesions, CT may be a more robust modality
for future follow-up.
5. Additional ablation site of hepatic segment IV is not appreciated
on current examination
6. Cirrhosis.
7. Unchanged segmental biliary ductal dilatation in the liver dome,
hepatic segment VII and VIII.

## 2022-01-22 MED ORDER — GADOBUTROL 1 MMOL/ML IV SOLN
7.0000 mL | Freq: Once | INTRAVENOUS | Status: AC | PRN
  Administered 2022-01-22: 7 mL via INTRAVENOUS

## 2022-01-28 ENCOUNTER — Encounter: Payer: Self-pay | Admitting: *Deleted

## 2022-01-28 ENCOUNTER — Ambulatory Visit
Admission: RE | Admit: 2022-01-28 | Discharge: 2022-01-28 | Disposition: A | Discharge status: Home / Self Care | Payer: Medicare Other | Source: Ambulatory Visit | Attending: Interventional Radiology | Admitting: Interventional Radiology

## 2022-01-28 DIAGNOSIS — C220 Liver cell carcinoma: Secondary | ICD-10-CM

## 2022-01-28 HISTORY — PX: IR RADIOLOGIST EVAL & MGMT: IMG5224

## 2022-01-28 NOTE — Progress Notes (Signed)
Patient ID: Donald Aguilar, male   DOB: 19-Jan-1951, 70 y.o.   MRN: [id number] ?    ? ? ?Chief Complaint: ?Patient was seen in consultation today for hepatocellular carcinoma at the request of Baley Lorimer ? ?Referring Physician(s): ?Wilfrid Lund, MD ? ?History of Present Illness: ?Donald Aguilar is a 71 y.o. 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 ?03/02/2020 CTA obtained for preop planning of TIPS, also demonstrated enhancing regions in segment 4 and segment 8 of the liver, nonspecific ? 05/11/2020 patient went elective uncomplicated TIPS creation with large-volume paracentesis.  This significantly controlled his abdominal ascites.  He has   not needed anymore paracentesis procedures.  No further scrotal swelling/edema. ?08/16/2020 follow-up CT shows enlarging 1.2 cm segment 4 liver lesion arterial enhancing LiRADS 5 presumed hepatocellular carcinoma, and 2 smaller lesions in segment 8. ?11/23/2020 follow-up CT reveals 2 cm left liver lesion LiRAD 5;  smaller lesions in segment 3 and 8 with some motion degradation  ?12/25/2020 MR confirms 2.1 cm LiRADS 5 lesion in the left lobe.  The 2 smaller right lobe lesions are degraded by motion. ?5/57/3220 had uncomplicated left inguinal hernia repair with mesh. ?He is being assessed by [facility] for transplant. ?02/06/2021 CT-guided core biopsy and cryoablation of medial left hepatic lobe lesion. Surgical pathology positive for well-to moderately differentiated hepatocellular carcinoma. ?06/06/2021 CT abdomen showed ablation of the segment 2 lesion without residual disease, with an area inferior to the ablation zone showing venous phase washout characterized LR3.  Small LR 5 lesion suspected along the apex of the TIPS stent. ?01/03/2022 CT abdomen shows 3 new areas of focal arterial phase hyperenhancement  demonstrating washout. 2 of these are compatible with LR 4 lesions. 1 of these is along the posterior and inferior margin of the lateral segment left hepatic lobe ablation defect suggesting LR TR viable. Small LR 5 lesion described previously at the along the apex of ?the tips shunt is not included on all images  , but there is no arterial phase hyperenhancement visible in this location  .  ? 01/22/2022 MR liver shows ablation defect in the superior left lobe of the liver, hepatic segment II. Subtle hyperenhancement at the inferior margin of the ablation site, extending into hepatic segment III, the ?most clearly visualized component measuring approximately 1.8 x 1.6 cm. Subtle evidence of washout on delayed phase imaging without evidence of capsule. Findings are suspicious for residual or recurrent tumor versus incidental adjacent metachronous hepatocellular carcinoma, LI-RADS category TR viable. Note that this finding was described as two possibly distinct lesions by prior CT but appears to be a single contiguous lesion by MR.  Arterially hyperenhancing lesion of the inferior right lobe of the liver near the gallbladder fossa, hepatic segment V, measuring 1.1 x 1.1 cm, with evidence of washout but without capsular enhancement. As measured by MR, this is a LI-RADS category 5 lesion, consistent with hepatocellular carcinoma.  A previously described lesion in the vicinity of the superior TIPS, hepatic segment VIII, is not appreciated on current examination.  Given technical limitations of current examination and generally subtle character of these lesions, CT may be a more robust modality for future follow-up.  ? ?Today additional history obtained from the patient with the aid of interpreter, and epic chart.   Challenging social situation with limited resources and health literacy.  He denies any symptoms of  hepatic encephalopathy.     He has some persistent pain in his right lower quadrant of the abdomen which she  relates to previous thoracentesis procedure, predates the ablation.  He is eating well.  No abdominal or scrotal swelling. ?  ?  ? ?Past Medical History:  ?Diagnosis Date  ? Arthritis   ? knees  ? Ascites 09/09/2019  ? 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 GERD (gastroesophageal reflux disease)   ? 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 History of alcohol abuse   ? Hypotension   ? Hypotension   ? Neuromuscular disorder ([place])   ? peropheral neuropathy  ? ? ?Past Surgical History:  ?Procedure Laterality Date  ? BIOPSY  09/13/2019  ? Procedure: BIOPSY;  Surgeon: Ladene Artist, MD;  Location: [facility] ENDOSCOPY;  Service: Endoscopy;;  ? ESOPHAGOGASTRODUODENOSCOPY (EGD) WITH PROPOFOL N/A 09/13/2019  ? Procedure: ESOPHAGOGASTRODUODENOSCOPY (EGD) WITH PROPOFOL;  Surgeon: Ladene Artist, MD;  Location: [facility] ENDOSCOPY;  Service: Endoscopy;  Laterality: N/A;  ? INGUINAL HERNIA REPAIR Left 12/28/2020  ? Procedure: LEFT INGUINAL HERNIA REPAIR WITH MESH;  Surgeon: Coralie Keens, MD;  Location: [place];  Service: General;  Laterality: Left;  LMA  ? IR PARACENTESIS  09/12/2019  ? IR PARACENTESIS  09/29/2019  ? IR PARACENTESIS  10/19/2019  ? IR PARACENTESIS  11/02/2019  ? IR PARACENTESIS  11/10/2019  ? IR PARACENTESIS  11/22/2019  ? IR PARACENTESIS  12/06/2019  ? IR PARACENTESIS  12/16/2019  ? IR PARACENTESIS  12/23/2019  ? IR PARACENTESIS  12/30/2019  ? IR PARACENTESIS  01/06/2020  ? IR PARACENTESIS  01/16/2020  ? IR PARACENTESIS  02/01/2020  ? IR PARACENTESIS  02/08/2020  ? IR PARACENTESIS  02/28/2020  ? IR PARACENTESIS  03/23/2020  ? IR PARACENTESIS  04/17/2020  ? IR PARACENTESIS  05/02/2020  ? IR PARACENTESIS  05/11/2020  ? IR PARACENTESIS  08/23/2020  ? IR RADIOLOGIST EVAL & MGMT  03/20/2020  ? IR RADIOLOGIST EVAL & MGMT  06/26/2020  ? IR RADIOLOGIST EVAL & MGMT  01/09/2021  ? IR RADIOLOGIST EVAL & MGMT  03/06/2021  ? IR RADIOLOGIST EVAL & MGMT  07/02/2021  ? IR RADIOLOGIST EVAL & MGMT  01/10/2022  ? IR RADIOLOGIST EVAL & MGMT  01/28/2022  ? IR TIPS   05/11/2020  ? RADIOLOGY WITH ANESTHESIA N/A 05/11/2020  ? Procedure: IR WITH ANESTHESIA TRASJUGULAR INTRAHEPATIC PORTOSYSTEMIC SHUNT;  Surgeon: Arne Cleveland, MD;  Location: [place];  Service: Radiology;  Laterality: N/A;  ? RADIOLOGY WITH ANESTHESIA N/A 02/06/2021  ? Procedure: CT CRYO ABLATION;  Surgeon: Arne Cleveland, MD;  Location: [facility];  Service: Radiology;  Laterality: N/A;  ? ? ?Allergies: ?Patient has no known allergies. ? ?Medications: ?Prior to Admission medications   ?Medication Sig Start Date End Date Taking? Authorizing Provider  ?diclofenac Sodium (VOLTAREN) 1 % GEL Apply 2 g topically 4 (four) times daily. 06/18/21   Rehman, Areeg N, DO  ?diphenhydrAMINE-zinc acetate (BENADRYL) cream Apply 1 application topically 3 (three) times daily as needed for itching.    [provider]  ?furosemide (LASIX) 20 MG tablet Take 1 tablet (20 mg total) by mouth daily. 11/07/21   Rehman, Areeg N, DO  ?lactulose (CHRONULAC) 10 GM/15ML solution Take 15 mLs (10 g total) by mouth 2 (two) times daily as needed for mild constipation. 06/18/21   Rehman, Areeg N, DO  ?Sofosbuvir-Velpatasvir (EPCLUSA) 400-100 MG TABS Take 1 tablet by mouth daily. ?Patient not taking: Reported on  06/26/2021 09/12/20   Thayer Headings, MD  ?spironolactone (ALDACTONE) 50 MG tablet Take 1 tablet (50 mg total) by mouth daily. 11/07/21   Rehman, Areeg N, DO  ?  ? ?Family History  ?Problem Relation Age of Onset  ? Liver cancer Neg Hx   ? Colon cancer Neg Hx   ? Pancreatic cancer Neg Hx   ? Esophageal cancer Neg Hx   ? ? ?Social History  ? ?Socioeconomic History  ? Marital status: Single  ?  Spouse name: Not on file  ? Number of children: Not on file  ? Years of education: Not on file  ? Highest education level: Not on file  ?Occupational History  ? Not on file  ?Tobacco Use  ? Smoking status: Every Day  ?  Packs/day: 0.50  ?  Years: 50.00  ?  Pack years: 25.00  ?  Types: Cigarettes  ? Smokeless tobacco: Never  ? Tobacco comments:  ?   approximately a couple of packs a week  ?Vaping Use  ? Vaping Use: Never used  ?Substance and Sexual Activity  ? Alcohol use: Not Currently  ?  Comment: last drank in September, 2020  ? Drug use: Never  ? Sexual ac

## 2022-02-05 ENCOUNTER — Other Ambulatory Visit: Payer: Self-pay

## 2022-02-05 NOTE — Progress Notes (Signed)
The proposed treatment discussed in conference is for discussion purpose only and is not a binding recommendation.  The patients have not been physically examined, or presented with their treatment options.  Therefore, final treatment plans cannot be decided.  

## 2022-02-06 ENCOUNTER — Other Ambulatory Visit (HOSPITAL_COMMUNITY): Payer: Self-pay | Admitting: Interventional Radiology

## 2022-02-06 DIAGNOSIS — C220 Liver cell carcinoma: Secondary | ICD-10-CM

## 2022-02-12 NOTE — Congregational Nurse Program (Signed)
Home visit with interpreter [name].  Reminded him verbally and in writing of upcoming liver ablation on 02/26/2022 and pre-op appointment on 02/20/2022.  He understands this is the same procedure he had done last year and did not verbalize any questions or concerns.  [name] RN, Congregational Nurse [phone] ?

## 2022-02-14 ENCOUNTER — Other Ambulatory Visit: Payer: Self-pay | Admitting: Radiology

## 2022-02-18 NOTE — Progress Notes (Addendum)
COVID Vaccine Completed: yes x3 ?Date COVID Vaccine completed: 05/28/20, 06/26/20 ?Has received booster: 02/25/21 ?COVID vaccine manufacturer: Pfizer     ? ?Date of COVID positive in last 90 days:  no ? ?PCP - Harlow Ohms, DO ?Cardiologist -  ? ?Chest x-ray - n/a ?EKG - 02/20/22 Epic/chart ?Stress Test - n/a ?ECHO - 03/14/20 Epic ?Cardiac Cath - n/a ?Pacemaker/ICD device last checked:n/a ?Spinal Cord Stimulator: n/a ? ?Bowel Prep - no ? ?Sleep Study - n/a ?CPAP -  ? ?Fasting Blood Sugar - n/a ?Checks Blood Sugar _____ times a day ? ?Blood Thinner Instructions: n/a ?Aspirin Instructions: ?Last Dose: ? ?Activity level: Can go up a flight of stairs and perform activities of daily living without stopping and without symptoms of chest pain or shortness of breath. ?   ?Anesthesia review:  ? ?Patient denies shortness of breath, fever, cough and chest pain at PAT appointment ? ? ?Patient verbalized understanding of instructions that were given to them at the PAT appointment. Patient was also instructed that they will need to review over the PAT instructions again at home before surgery.  ?

## 2022-02-18 NOTE — Patient Instructions (Addendum)
DUE TO COVID-19 ONLY TWO VISITORS  (aged 69 and older)  ARE ALLOWED TO COME WITH YOU AND STAY IN THE WAITING ROOM ONLY DURING PRE OP AND PROCEDURE.   ?**NO VISITORS ARE ALLOWED IN THE SHORT STAY AREA OR RECOVERY ROOM!!** ? ?IF YOU WILL BE ADMITTED INTO THE HOSPITAL YOU ARE ALLOWED ONLY FOUR SUPPORT PEOPLE DURING VISITATION HOURS ONLY (7 AM -8PM)   ?The support person(s) must pass our screening, gel in and out, and wear a mask at all times, including in the patient?s room. ?Patients must also wear a mask when staff or their support person are in the room. ?Visitors GUEST BADGE MUST BE WORN VISIBLY  ?One adult visitor may remain with you overnight and MUST be in the room by 8 P.M. ?  ? ? Your procedure is scheduled on: 02/26/22 ? ? Report to [facility] Main Entrance ? ?  Report to admitting at 6:15 AM ? ? Call this number if you have problems the morning of surgery [phone] ? ? Do not eat food :After Midnight. ? ? After Midnight you may have the following liquids until 5:30 AM DAY OF SURGERY ? ?Water ?Black Coffee (sugar ok, NO MILK/CREAM OR CREAMERS)  ?Tea (sugar ok, NO MILK/CREAM OR CREAMERS) regular and decaf                             ?Plain Jell-O (NO RED)                                           ?Fruit ices (not with fruit pulp, NO RED)                                     ?Popsicles (NO RED)                                                                  ?Juice: apple, WHITE grape, WHITE cranberry ?Sports drinks like Gatorade (NO RED) ?Clear broth(vegetable,chicken,beef) ? ?FOLLOW BOWEL PREP AND ANY ADDITIONAL PRE OP INSTRUCTIONS YOU RECEIVED FROM YOUR SURGEON'S OFFICE!!! ?  ?  ?Oral Hygiene is also important to reduce your risk of infection.                                    ?Remember - BRUSH YOUR TEETH THE MORNING OF SURGERY WITH YOUR REGULAR TOOTHPASTE ? ? Do NOT smoke after Midnight ? ? Take these medicines the morning of surgery with A SIP OF WATER: Tylenol  ?                  ?            You may not have any metal on your body including jewelry, and body piercing ? ?           Do not wear lotions, powders, cologne, or deodorant ? ?            Men  may shave face and neck. ? ? Do not bring valuables to the hospital. [facility] NOT ?            RESPONSIBLE   FOR VALUABLES. ? ? Bring small overnight bag day of surgery. ? ?            Please read over the following fact sheets you were given: IF [place] [phone]- Apolonio Schneiders ? ?   [place] - Preparing for Surgery ?Before surgery, you can play an important role.  Because skin is not sterile, your skin needs to be as free of germs as possible.  You can reduce the number of germs on your skin by washing with CHG (chlorahexidine gluconate) soap before surgery.  CHG is an antiseptic cleaner which kills germs and bonds with the skin to continue killing germs even after washing. ?Please DO NOT use if you have an allergy to CHG or antibacterial soaps.  If your skin becomes reddened/irritated stop using the CHG and inform your nurse when you arrive at Short Stay. ?Do not shave (including legs and underarms) for at least 48 hours prior to the first CHG shower.  You may shave your face/neck. ? ?Please follow these instructions carefully: ? 1.  Shower with CHG Soap the night before surgery and the  morning of surgery. ? 2.  If you choose to wash your hair, wash your hair first as usual with your normal  shampoo. ? 3.  After you shampoo, rinse your hair and body thoroughly to remove the shampoo.                            ? 4.  Use CHG as you would any other liquid soap.  You can apply chg directly to the skin and wash.  Gently with a scrungie or clean washcloth. ? 5.  Apply the CHG Soap to your body ONLY FROM THE NECK DOWN.   Do   not use on face/ open      ?                     Wound or open sores. Avoid contact with eyes, ears mouth and   genitals (private parts).  ?                     Production manager,   Genitals (private parts) with your normal soap. ?            6.  Wash thoroughly, paying special attention to the area where your    surgery  will be performed. ? 7.  Thoroughly rinse your body with warm water from the neck down. ? 8.  DO NOT shower/wash with your normal soap after using and rinsing off the CHG Soap. ?               9.  Pat yourself dry with a clean towel. ?           10.  Wear clean pajamas. ?           11.  Place clean sheets on your bed the night of your first shower and do not  sleep with pets. ?Day of Surgery : ?Do not apply any lotions/deodorants the morning of surgery.  Please wear clean clothes to the hospital/surgery center. ? ?FAILURE TO FOLLOW THESE INSTRUCTIONS MAY RESULT IN THE CANCELLATION  OF YOUR SURGERY ? ?PATIENT SIGNATURE_________________________________ ? ?NURSE SIGNATURE__________________________________ ? ?________________________________________________________________________  ?WHAT IS A BLOOD TRANSFUSION? Blood Transfusion Information ? ?A transfusion is the replacement of blood or some of its parts. Blood is made up of multiple cells which provide different functions. ?Red blood cells carry oxygen and are used for blood loss replacement. ?White blood cells fight against infection. ?Platelets control bleeding. ?Plasma helps clot blood. ?Other blood products are available for specialized needs, such as hemophilia or other clotting disorders. ?BEFORE THE TRANSFUSION  ?Who gives blood for transfusions?  ?Healthy volunteers who are fully evaluated to make sure their blood is safe. This is blood bank blood. ?Transfusion therapy is the safest it has ever been in the practice of medicine. Before blood is taken from a donor, a complete history is taken to make sure that person has no history of diseases nor engages in risky social behavior (examples are intravenous drug use or sexual activity with multiple partners). The donor's travel history is screened to minimize risk of  transmitting infections, such as malaria. The donated blood is tested for signs of infectious diseases, such as HIV and hepatitis. The blood is then tested to be sure it is compatible with you in order to minimize the chance of a transfusion reaction. If you or a relative donates blood, this is often done in anticipation of surgery and is not appropriate for emergency situations. It takes many days to process the donated blood. ?RISKS AND COMPLICATIONS ?Although transfusion therapy is very safe and saves many lives, the main dangers of transfusion include:  ?Getting an infectious disease. ?Developing a transfusion reaction. This is an allergic reaction to something in the blood you were given. Every precaution is taken to prevent this. ?The decision to have a blood transfusion has been considered carefully by your caregiver before blood is given. Blood is not given unless the benefits outweigh the risks. ?AFTER THE TRANSFUSION ?Right after receiving a blood transfusion, you will usually feel much better and more energetic. This is especially true if your red blood cells have gotten low (anemic). The transfusion raises the level of the red blood cells which carry oxygen, and this usually causes an energy increase. ?The nurse administering the transfusion will monitor you carefully for complications. ?HOME CARE INSTRUCTIONS  ?No special instructions are needed after a transfusion. You may find your energy is better. Speak with your caregiver about any limitations on activity for underlying diseases you may have. ?SEEK MEDICAL CARE IF:  ?Your condition is not improving after your transfusion. ?You develop redness or irritation at the intravenous (IV) site. ?SEEK IMMEDIATE MEDICAL CARE IF:  ?Any of the following symptoms occur over the next 12 hours: ?Shaking chills. ?You have a temperature by mouth above 102? F (38.9? C), not controlled by medicine. ?Chest, back, or muscle pain. ?People around you feel you are not  acting correctly or are confused. ?Shortness of breath or difficulty breathing. ?Dizziness and fainting. ?You get a rash or develop hives. ?You have a decrease in urine output. ?Your urine turns a dark color or changes

## 2022-02-20 ENCOUNTER — Encounter (HOSPITAL_COMMUNITY): Payer: Self-pay

## 2022-02-20 ENCOUNTER — Encounter (HOSPITAL_COMMUNITY)
Admission: RE | Admit: 2022-02-20 | Discharge: 2022-02-20 | Disposition: A | Discharge status: Home / Self Care | Payer: Medicare Other | Source: Ambulatory Visit | Attending: Interventional Radiology | Admitting: Interventional Radiology

## 2022-02-20 DIAGNOSIS — D490 Neoplasm of unspecified behavior of digestive system: Secondary | ICD-10-CM | POA: Insufficient documentation

## 2022-02-20 DIAGNOSIS — Z01818 Encounter for other preprocedural examination: Secondary | ICD-10-CM | POA: Diagnosis present

## 2022-02-20 LAB — COMPREHENSIVE METABOLIC PANEL
ALT: 22 U/L (ref 0–44)
AST: 36 U/L (ref 15–41)
Albumin: 3.5 g/dL (ref 3.5–5.0)
Alkaline Phosphatase: 77 U/L (ref 38–126)
Anion gap: 4 — ABNORMAL LOW (ref 5–15)
BUN: 12 mg/dL (ref 8–23)
CO2: 25 mmol/L (ref 22–32)
Calcium: 9 mg/dL (ref 8.9–10.3)
Chloride: 109 mmol/L (ref 98–111)
Creatinine, Ser: 0.77 mg/dL (ref 0.61–1.24)
GFR, Estimated: >60 mL/min (ref >60)
Glucose, Bld: 80 mg/dL (ref 70–99)
Potassium: 4.1 mmol/L (ref 3.5–5.1)
Sodium: 138 mmol/L (ref 135–145)
Total Bilirubin: 1.3 mg/dL — ABNORMAL HIGH (ref 0.3–1.2)
Total Protein: 7.5 g/dL (ref 6.5–8.1)

## 2022-02-20 LAB — CBC WITH DIFFERENTIAL/PLATELET
Abs Immature Granulocytes: 0.01 10*3/uL (ref 0.00–0.07)
Basophils Absolute: 0.1 10*3/uL (ref 0.0–0.1)
Basophils Relative: 1 %
Eosinophils Absolute: 0.5 10*3/uL (ref 0.0–0.5)
Eosinophils Relative: 8 %
HCT: 44.2 % (ref 39.0–52.0)
Hemoglobin: 14.8 g/dL (ref 13.0–17.0)
Immature Granulocytes: 0 %
Lymphocytes Relative: 42 %
Lymphs Abs: 2.5 10*3/uL (ref 0.7–4.0)
MCH: 32 pg (ref 26.0–34.0)
MCHC: 33.5 g/dL (ref 30.0–36.0)
MCV: 95.7 fL (ref 80.0–100.0)
Monocytes Absolute: 0.9 10*3/uL (ref 0.1–1.0)
Monocytes Relative: 14 %
Neutro Abs: 2.1 10*3/uL (ref 1.7–7.7)
Neutrophils Relative %: 35 %
Platelets: 142 10*3/uL — ABNORMAL LOW (ref 150–400)
RBC: 4.62 MIL/uL (ref 4.22–5.81)
RDW: 14.6 % (ref 11.5–15.5)
WBC: 5.9 10*3/uL (ref 4.0–10.5)
nRBC: 0 % (ref 0.0–0.2)

## 2022-02-20 LAB — PROTIME-INR
INR: 1.2 (ref 0.8–1.2)
Prothrombin Time: 15.1 seconds (ref 11.4–15.2)

## 2022-02-20 NOTE — Progress Notes (Addendum)
Patient verbalized concern about spending night after his surgery. He has an important  appointment 02/27/22 @ 1300 in [place]. ? ?Patient's friend called requesting to reschedule surgery. Provided with scheduler number. ?

## 2022-02-24 NOTE — Congregational Nurse Program (Signed)
Home visit. Patient needed help reading and understanding instructions for liver ablation scheduled for 02/26/2022.  Stated he understood and will call CN tomorrow if he has questions.  Informed patient Medicaid transportation has been requested. [name] RN, Congregational Nurse [phone]  ?

## 2022-02-25 ENCOUNTER — Other Ambulatory Visit (HOSPITAL_COMMUNITY): Payer: Self-pay | Admitting: Physician Assistant

## 2022-02-25 ENCOUNTER — Other Ambulatory Visit: Payer: Self-pay | Admitting: Radiology

## 2022-02-25 NOTE — Anesthesia Preprocedure Evaluation (Addendum)
Anesthesia Evaluation  ?Patient identified by MRN, date of birth, ID band ?Patient awake ? ? ? ?Reviewed: ?Allergy & Precautions, NPO status , Patient's Chart, lab work & pertinent test results ? ?Airway ?Mallampati: II ? ?TM Distance: >3 FB ?Neck ROM: Full ? ? ? Dental ? ?(+) Poor Dentition ?  ?Pulmonary ?neg pulmonary ROS, Current Smoker,  ?  ?Pulmonary exam normal ? ? ? ? ? ? ? Cardiovascular ?negative cardio ROS ? ? ?Rhythm:Regular Rate:Normal ? ? ?  ?Neuro/Psych ?negative neurological ROS ? negative psych ROS  ? GI/Hepatic ?PUD, GERD  ,(+) Cirrhosis  ? Esophageal Varices and ascites ? substance abuse ? alcohol use, Hepatitis -, [facility] ?  ?Endo/Other  ?negative endocrine ROS ? Renal/GU ?negative Renal ROS  ?negative genitourinary ?  ?Musculoskeletal ? ?(+) Arthritis , Osteoarthritis,   ? Abdominal ?Normal abdominal exam  (+)   ?Peds ? Hematology ?negative hematology ROS ?(+)   ?Anesthesia Other Findings ? ? Reproductive/Obstetrics ? ?  ? ? ? ? ? ? ? ? ? ? ? ? ? ?  ?  ? ? ? ? ? ? ? ?Anesthesia Physical ?Anesthesia Plan ? ?ASA: 3 ? ?Anesthesia Plan: General  ? ?Post-op Pain Management:   ? ?Induction: Intravenous ? ?PONV Risk Score and Plan: 1 and Ondansetron, Dexamethasone and Treatment may vary due to age or medical condition ? ?Airway Management Planned: Mask and Oral ETT ? ?Additional Equipment: None ? ?Intra-op Plan:  ? ?Post-operative Plan: Extubation in OR ? ?Informed Consent: I have reviewed the patients History and Physical, chart, labs and discussed the procedure including the risks, benefits and alternatives for the proposed anesthesia with the patient or authorized representative who has indicated his/her understanding and acceptance.  ? ? ? ?Dental advisory given and Interpreter used for interveiw ? ?Plan Discussed with: CRNA ? ?Anesthesia Plan Comments: (Lab Results ?     Component                Value               Date                 ?     ALT                       22                  02/20/2022           ?     AST                      36                  02/20/2022           ?     ALKPHOS                  77                  02/20/2022           ?     BILITOT                  1.3 (H)             02/20/2022           ?Lab Results ?     Component  Value               Date                 ?     NA                       138                 02/20/2022           ?     K                        4.1                 02/20/2022           ?     CO2                      25                  02/20/2022           ?     GLUCOSE                  80                  02/20/2022           ?     BUN                      12                  02/20/2022           ?     CREATININE               0.77                02/20/2022           ?     CALCIUM                  9.0                 02/20/2022           ?     EGFR                     96                  11/07/2021           ?     GFRNONAA                 >60                 02/20/2022           ?Lab Results ?     Component                Value               Date                 ?     WBC                      5.9  02/20/2022           ?     HGB                      14.8                02/20/2022           ?     HCT                      44.2                02/20/2022           ?     MCV                      95.7                02/20/2022           ?     PLT                      142 (L)             02/20/2022          )  ? ? ? ? ? ?Anesthesia Quick Evaluation ? ?

## 2022-02-26 ENCOUNTER — Ambulatory Visit (HOSPITAL_COMMUNITY)
Admission: RE | Admit: 2022-02-26 | Discharge: 2022-02-26 | Disposition: A | Discharge status: Home / Self Care | Payer: Medicare Other | Attending: Interventional Radiology | Admitting: Interventional Radiology

## 2022-02-26 ENCOUNTER — Ambulatory Visit (HOSPITAL_BASED_OUTPATIENT_CLINIC_OR_DEPARTMENT_OTHER): Payer: Medicare Other

## 2022-02-26 ENCOUNTER — Encounter (HOSPITAL_COMMUNITY): Payer: Self-pay | Admitting: Interventional Radiology

## 2022-02-26 ENCOUNTER — Ambulatory Visit (HOSPITAL_COMMUNITY): Payer: Medicare Other

## 2022-02-26 ENCOUNTER — Encounter (HOSPITAL_COMMUNITY): Payer: Self-pay

## 2022-02-26 ENCOUNTER — Encounter (HOSPITAL_COMMUNITY)
Admission: RE | Disposition: A | Discharge status: Home / Self Care | Payer: Self-pay | Source: Home / Self Care | Attending: Interventional Radiology

## 2022-02-26 ENCOUNTER — Ambulatory Visit (HOSPITAL_COMMUNITY)
Admission: RE | Admit: 2022-02-26 | Discharge: 2022-02-26 | Disposition: A | Discharge status: Home / Self Care | Payer: Medicare Other | Source: Ambulatory Visit | Attending: Interventional Radiology | Admitting: Interventional Radiology

## 2022-02-26 ENCOUNTER — Ambulatory Visit (HOSPITAL_COMMUNITY): Payer: Medicare Other | Admitting: Emergency Medicine

## 2022-02-26 DIAGNOSIS — K7031 Alcoholic cirrhosis of liver with ascites: Secondary | ICD-10-CM

## 2022-02-26 DIAGNOSIS — C220 Liver cell carcinoma: Secondary | ICD-10-CM | POA: Insufficient documentation

## 2022-02-26 DIAGNOSIS — M1990 Unspecified osteoarthritis, unspecified site: Secondary | ICD-10-CM

## 2022-02-26 DIAGNOSIS — K279 Peptic ulcer, site unspecified, unspecified as acute or chronic, without hemorrhage or perforation: Secondary | ICD-10-CM

## 2022-02-26 HISTORY — PX: RADIOLOGY WITH ANESTHESIA: SHX6223

## 2022-02-26 LAB — HEPATIC FUNCTION PANEL
ALT: 22 U/L (ref 0–44)
AST: 31 U/L (ref 15–41)
Albumin: 3.3 g/dL — ABNORMAL LOW (ref 3.5–5.0)
Alkaline Phosphatase: 74 U/L (ref 38–126)
Bilirubin, Direct: 0.2 mg/dL (ref 0.0–0.2)
Indirect Bilirubin: 1 mg/dL — ABNORMAL HIGH (ref 0.3–0.9)
Total Bilirubin: 1.2 mg/dL (ref 0.3–1.2)
Total Protein: 6.9 g/dL (ref 6.5–8.1)

## 2022-02-26 LAB — CBC WITH DIFFERENTIAL/PLATELET
Abs Immature Granulocytes: 0.01 10*3/uL (ref 0.00–0.07)
Basophils Absolute: 0.1 10*3/uL (ref 0.0–0.1)
Basophils Relative: 2 %
Eosinophils Absolute: 0.6 10*3/uL — ABNORMAL HIGH (ref 0.0–0.5)
Eosinophils Relative: 10 %
HCT: 41.9 % (ref 39.0–52.0)
Hemoglobin: 14.2 g/dL (ref 13.0–17.0)
Immature Granulocytes: 0 %
Lymphocytes Relative: 42 %
Lymphs Abs: 2.4 10*3/uL (ref 0.7–4.0)
MCH: 32.3 pg (ref 26.0–34.0)
MCHC: 33.9 g/dL (ref 30.0–36.0)
MCV: 95.2 fL (ref 80.0–100.0)
Monocytes Absolute: 0.5 10*3/uL (ref 0.1–1.0)
Monocytes Relative: 8 %
Neutro Abs: 2.2 10*3/uL (ref 1.7–7.7)
Neutrophils Relative %: 38 %
Platelets: 161 10*3/uL (ref 150–400)
RBC: 4.4 MIL/uL (ref 4.22–5.81)
RDW: 14.6 % (ref 11.5–15.5)
WBC: 5.7 10*3/uL (ref 4.0–10.5)
nRBC: 0 % (ref 0.0–0.2)

## 2022-02-26 LAB — TYPE AND SCREEN
ABO/RH(D): A POS
Antibody Screen: NEGATIVE

## 2022-02-26 IMAGING — DX DG CHEST 1V
1 series · 1 of 1 positions shown · non-contrast
Comparison: Chest x-ray dated [DATE]

CLINICAL DATA: Preop ablation

EXAM:
CHEST  1 VIEW

[chest pa]
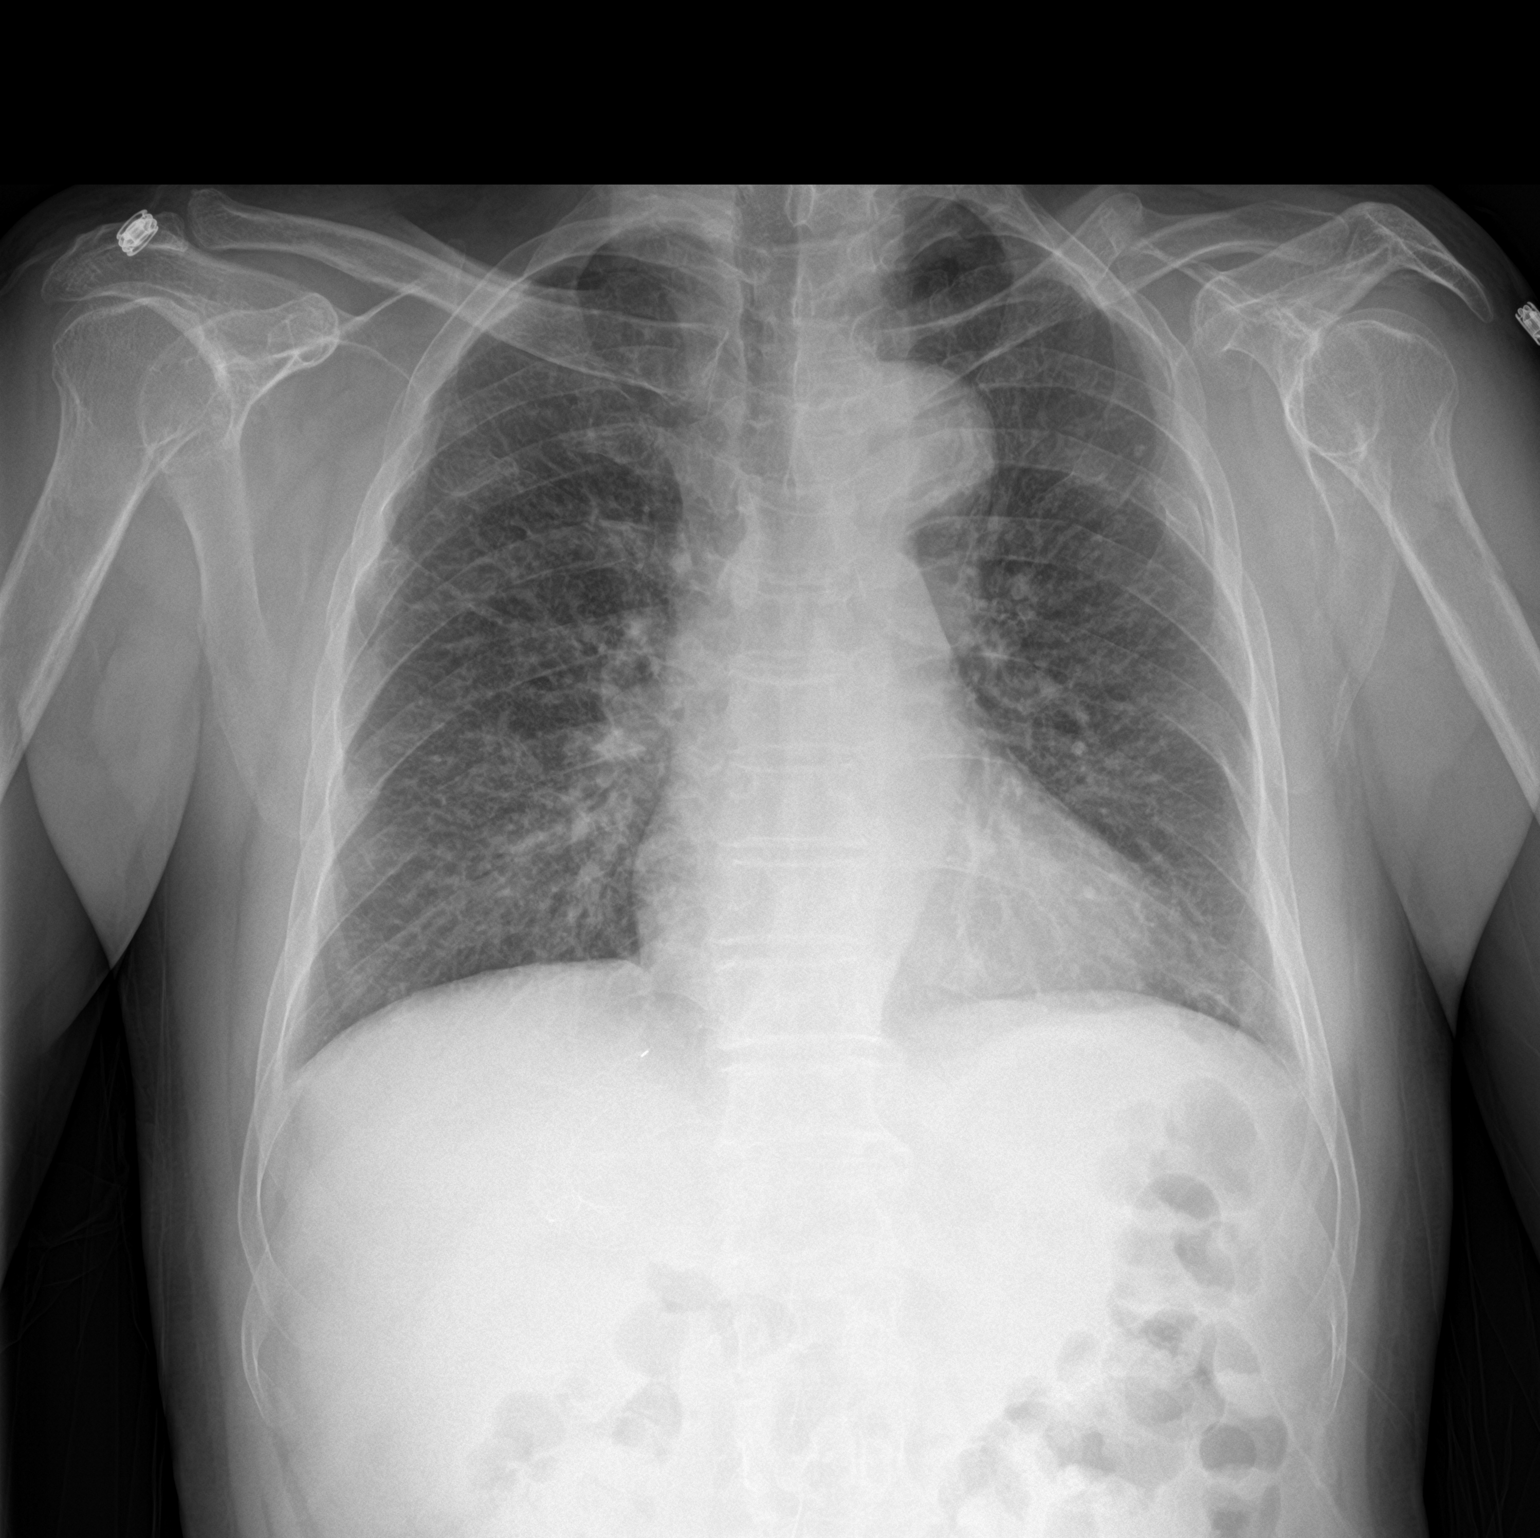

[1 of 1 positions shown; findings below may reference images not displayed]

FINDINGS: The heart size and mediastinal contours are within normal limits.
Both lungs are clear. Old left clavicle fracture and bilateral rib
fractures.
IMPRESSION: No active disease.

## 2022-02-26 IMAGING — CT CT GUIDANCE TISSUE ABLATION
4 of 6 series · 16 of 34 positions shown, 18 images · non-contrast
Comparison: none

INDICATION: Ethanol cirrhosis status post tips creation [DATE], ablation of
segment 2 hepatocellular carcinoma [DATE], with recurrent lesion
in hepatic segment 3.

[Series 2: i-spiral 2.0 bf37 · axial · 0.84mm/px · z∈[-215,-83]mm · 6 of 100 slices shown, 8 images (1 of 4)]
[im 17/100  mediastinal]
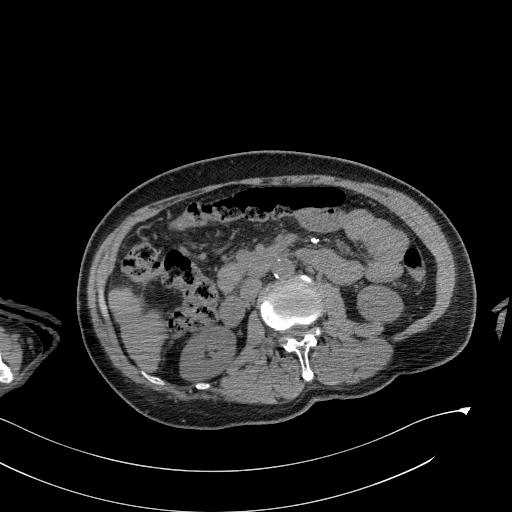
[im 17/100  lung]
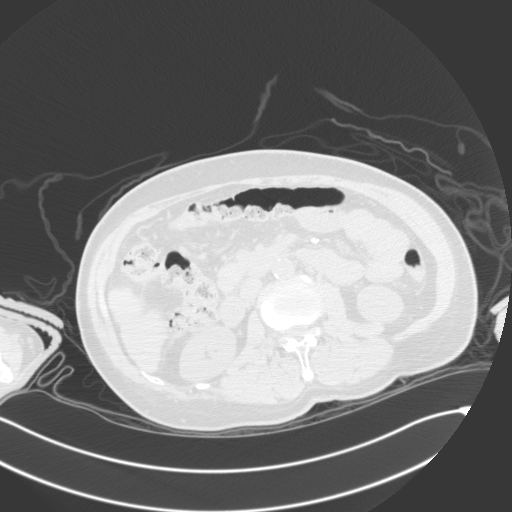
[im 34/100  lung]
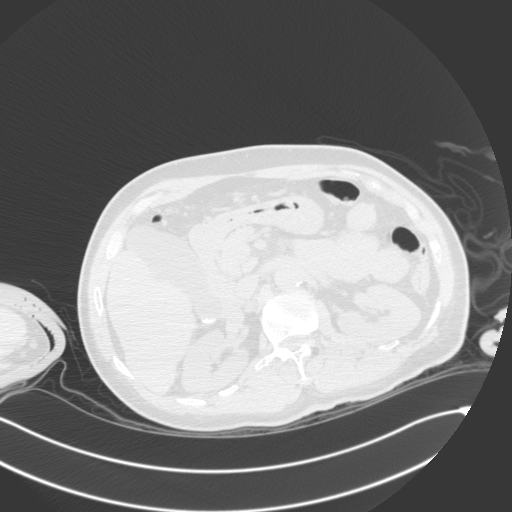
[im 46/100  lung]
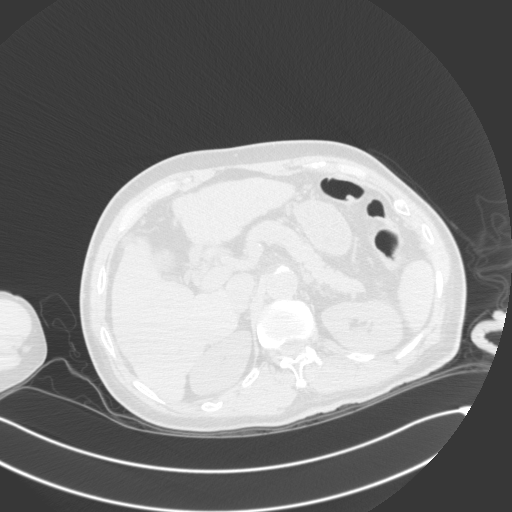
[im 50/100  lung]
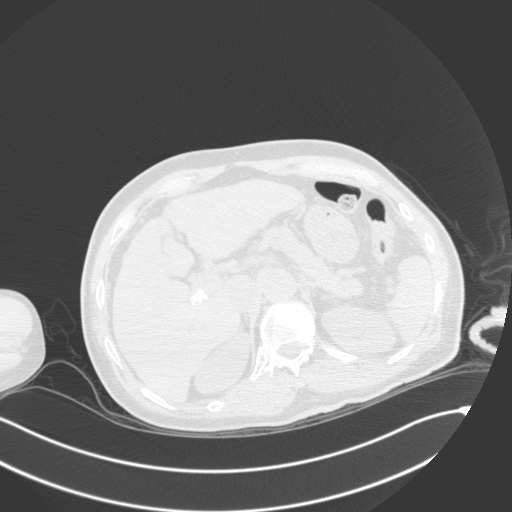
[im 67/100  mediastinal]
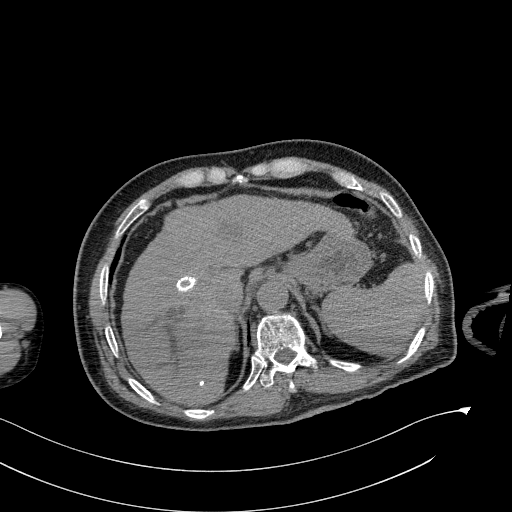
[im 67/100  lung]
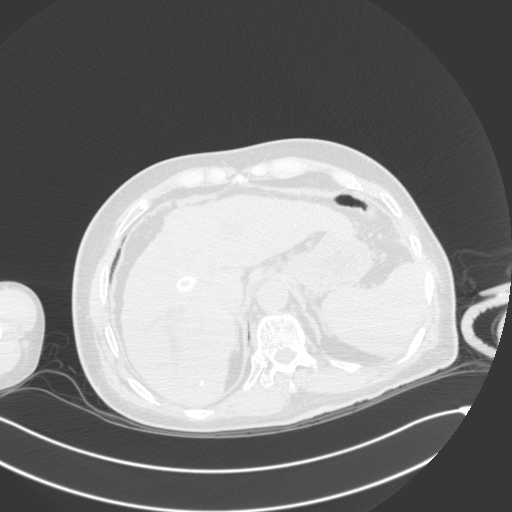
[im 83/100  lung]
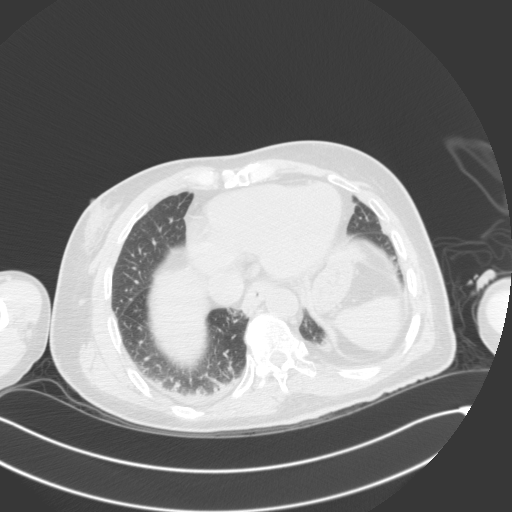

[Series 3: i-spiral 2.0 bf37 · axial · 0.84mm/px · z∈[-157,-85]mm · 4 of 70 slices shown (2 of 4)]
[im 16/70  lung]
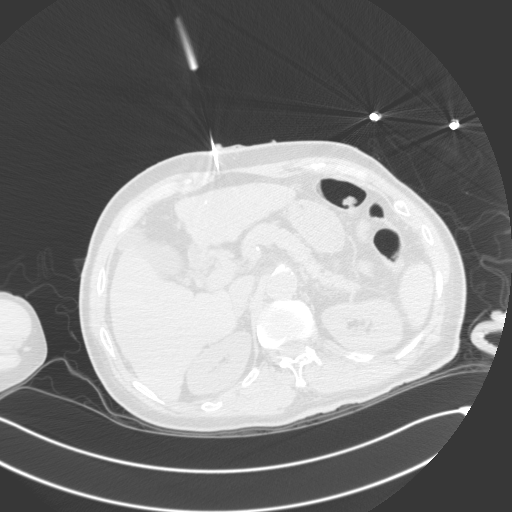
[im 18/70  lung]
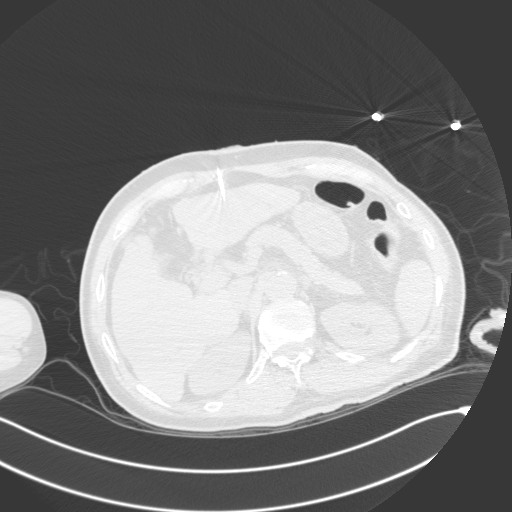
[im 35/70  lung]
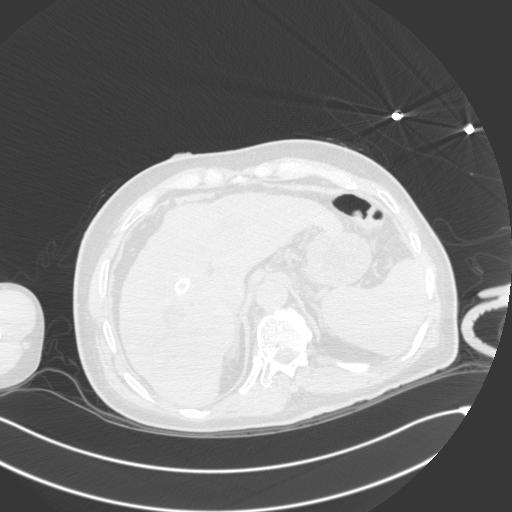
[im 52/70  lung]
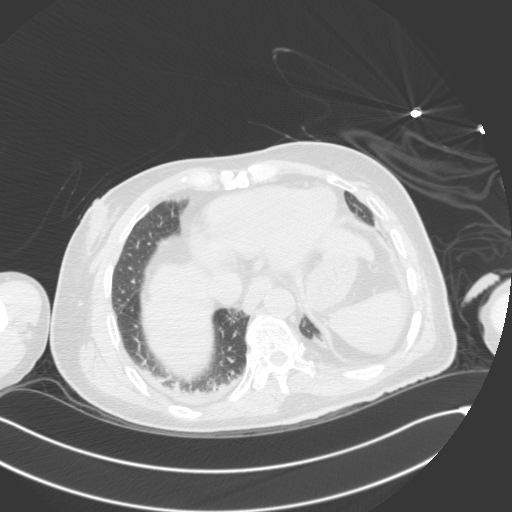

[Series 4: i-spiral 2.0 bf37 · axial · 0.84mm/px · z∈[-157,-95]mm · 3 of 70 slices shown (3 of 4)]
[im 16/70  lung]
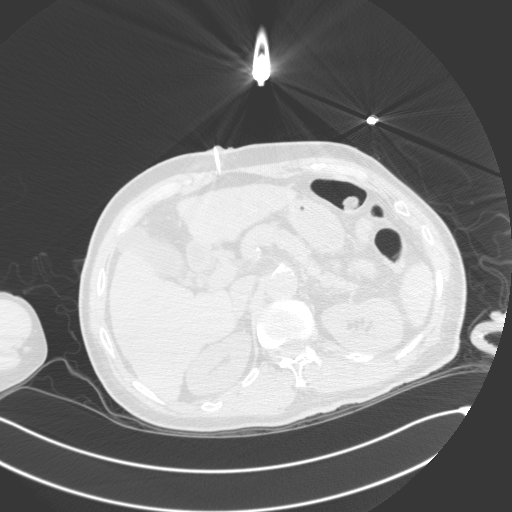
[im 24/70  lung]
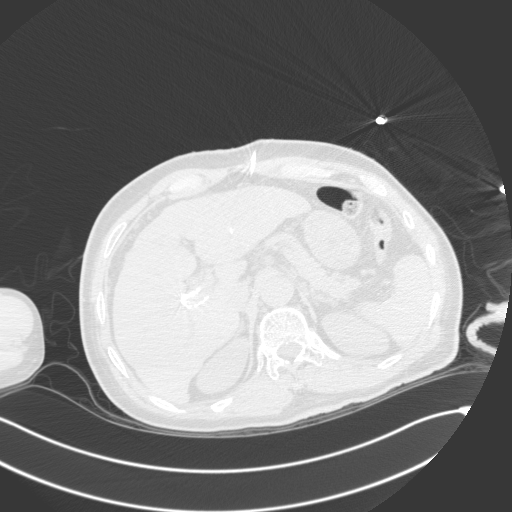
[im 47/70  lung]
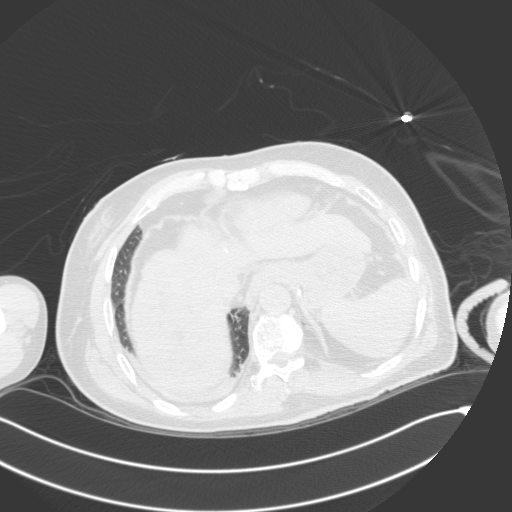

[Series 5: i-spiral 2.0 bf37 · axial · 0.84mm/px · z∈[-157,-93]mm · 3 of 67 slices shown (4 of 4)]
[im 13/67  lung]
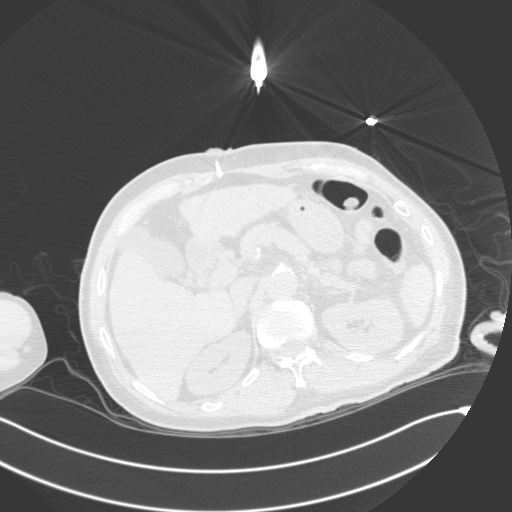
[im 23/67  lung]
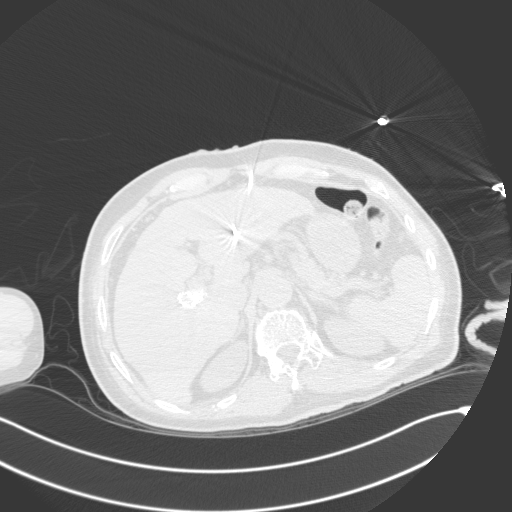
[im 45/67  lung]
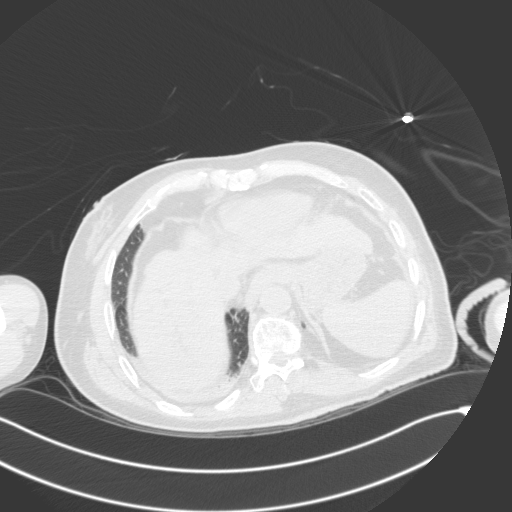

[16 of 34 positions shown; findings below may reference images not displayed]

EXAM:
CT & US-GUIDED PERCUTANEOUS THERMAL ABLATION OF LIVER LESION

ANESTHESIA/SEDATION:
General

MEDICATIONS:
Cefoxitin 2   g IV within 1 hour of procedure start

CONTRAST:  None required

PROCEDURE:
The procedure, risks, benefits, and alternatives were explained to
the patient. Questions regarding the procedure were encouraged and
answered. The patient understands and consents to the procedure.

The patient was placed under general anesthesia. Initial unenhanced
CT was performed in a supine position to localize the segment 3
lesion, which was indistinct on noncontrast imaging. Limited
ultrasound demonstrated conspicuity of the segment 2 lesion. The
procedure was planned.

The patient was prepped with chlorhexidine in a sterile fashion, and
a sterile drape was applied covering the operative field. A sterile
gown and sterile gloves were used for the procedure.

A 22 gauge spinal needle was advanced under ultrasound guidance in
expected trajectory of the microwave ablation probe for procedural
planning. Once the appropriate trajectory was established, a 15 cm
17 gauge PINKY percutaneous microwave ablation probe was advanced
into the inferior aspect of the hypo attenuating hepatic lesion
under real-time ultrasound guidance. CT confirms good needle
position. Subsequently, a second 15 cm 17 gauge ablation probe was
advanced into the cephalad extent of the lesion. CT confirms good
probe position, in comparison with a prior contrast enhanced CT
study. Once appropriate positioning was confirmed, a 5-minute
ablation was performed.

Tract ablation was performed as the microwave ablation probe was
removed. Hemostasis was achieved with manual compression. A limited
postprocedural scan was performed. Addressing was placed.

COMPLICATIONS:
None immediate.
FINDINGS: The region of the segment 3 lesion was localized on CT, and the
lesion could be better distinctly visualized on ultrasound.

Under intermittent ultrasound guidance, two 17 gauge PR percutaneous
microwave ablation probes inserted into the mass. CT images
generated during the procedure to ensure adequate treatment
coverage.

Post ablation imaging demonstrates an adequate ablation zone and was
negative for obvious complication, specifically, no pneumothorax or
significant hemorrhage about the ablation site.
IMPRESSION: Successful CT guided percutaneous thermal ablation of hepatic
segment 3 hepatoma.

PLAN:
Initial follow-up will be performed in approximately 4 weeks.

## 2022-02-26 SURGERY — RADIOLOGY WITH ANESTHESIA
Anesthesia: General

## 2022-02-26 MED ORDER — SUGAMMADEX SODIUM 200 MG/2ML IV SOLN
INTRAVENOUS | Status: DC | PRN
  Administered 2022-02-26: 200 mg via INTRAVENOUS

## 2022-02-26 MED ORDER — FENTANYL CITRATE (PF) 100 MCG/2ML IJ SOLN
INTRAMUSCULAR | Status: AC
  Filled 2022-02-26: qty 2

## 2022-02-26 MED ORDER — EPHEDRINE SULFATE-NACL 50-0.9 MG/10ML-% IV SOSY
PREFILLED_SYRINGE | INTRAVENOUS | Status: DC | PRN
  Administered 2022-02-26: 5 mg via INTRAVENOUS
  Administered 2022-02-26: 10 mg via INTRAVENOUS

## 2022-02-26 MED ORDER — PROPOFOL 10 MG/ML IV BOLUS
INTRAVENOUS | Status: DC | PRN
  Administered 2022-02-26: 150 mg via INTRAVENOUS

## 2022-02-26 MED ORDER — ROCURONIUM BROMIDE 10 MG/ML (PF) SYRINGE
PREFILLED_SYRINGE | INTRAVENOUS | Status: DC | PRN
  Administered 2022-02-26: 60 mg via INTRAVENOUS

## 2022-02-26 MED ORDER — HYDROCODONE-ACETAMINOPHEN 5-325 MG PO TABS: 1.0000 | ORAL_TABLET | ORAL | Status: DC | PRN

## 2022-02-26 MED ORDER — ONDANSETRON HCL 4 MG/2ML IJ SOLN
4.0000 mg | Freq: Four times a day (QID) | INTRAMUSCULAR | Status: DC | PRN
Start: 2022-02-26 — End: 2022-02-26

## 2022-02-26 MED ORDER — FENTANYL CITRATE PF 50 MCG/ML IJ SOSY
25.0000 ug | PREFILLED_SYRINGE | INTRAMUSCULAR | Status: DC | PRN
  Administered 2022-02-26: 50 ug via INTRAVENOUS

## 2022-02-26 MED ORDER — LACTATED RINGERS IV SOLN: INTRAVENOUS | Status: DC

## 2022-02-26 MED ORDER — DOCUSATE SODIUM 100 MG PO CAPS: 100.0000 mg | ORAL_CAPSULE | Freq: Two times a day (BID) | ORAL | Status: DC

## 2022-02-26 MED ORDER — ONDANSETRON HCL 4 MG/2ML IJ SOLN
INTRAMUSCULAR | Status: DC | PRN
  Administered 2022-02-26: 4 mg via INTRAVENOUS

## 2022-02-26 MED ORDER — ACETAMINOPHEN 10 MG/ML IV SOLN: 1000.0000 mg | Freq: Once | INTRAVENOUS | Status: DC | PRN

## 2022-02-26 MED ORDER — DEXAMETHASONE SODIUM PHOSPHATE 10 MG/ML IJ SOLN
INTRAMUSCULAR | Status: DC | PRN
  Administered 2022-02-26: 10 mg via INTRAVENOUS

## 2022-02-26 MED ORDER — LIDOCAINE 2% (20 MG/ML) 5 ML SYRINGE
INTRAMUSCULAR | Status: DC | PRN
  Administered 2022-02-26: 50 mg via INTRAVENOUS

## 2022-02-26 MED ORDER — ORAL CARE MOUTH RINSE: 15.0000 mL | Freq: Once | OROMUCOSAL | Status: AC

## 2022-02-26 MED ORDER — CHLORHEXIDINE GLUCONATE 0.12 % MT SOLN
15.0000 mL | Freq: Once | OROMUCOSAL | Status: AC
  Administered 2022-02-26: 15 mL via OROMUCOSAL

## 2022-02-26 MED ORDER — FENTANYL CITRATE (PF) 250 MCG/5ML IJ SOLN
INTRAMUSCULAR | Status: AC
  Filled 2022-02-26: qty 5

## 2022-02-26 MED ORDER — FENTANYL CITRATE PF 50 MCG/ML IJ SOSY
PREFILLED_SYRINGE | INTRAMUSCULAR | Status: AC
  Filled 2022-02-26: qty 1

## 2022-02-26 MED ORDER — SODIUM CHLORIDE 0.9 % IV SOLN
2.0000 g | Freq: Once | INTRAVENOUS | Status: AC
  Administered 2022-02-26: 2 g via INTRAVENOUS
  Filled 2022-02-26: qty 2

## 2022-02-26 MED ORDER — FENTANYL CITRATE (PF) 100 MCG/2ML IJ SOLN
INTRAMUSCULAR | Status: DC | PRN
  Administered 2022-02-26 (×2): 25 ug via INTRAVENOUS

## 2022-02-26 NOTE — Anesthesia Postprocedure Evaluation (Signed)
Anesthesia Post Note ? ?Patient: Donald Aguilar ? ?Procedure(s) Performed: CT MICROWAVE ABLATION ? ?  ? ?Patient location during evaluation: PACU ?Anesthesia Type: General ?Level of consciousness: awake and alert ?Pain management: pain level controlled ?Vital Signs Assessment: post-procedure vital signs reviewed and stable ?Respiratory status: spontaneous breathing, nonlabored ventilation, respiratory function stable and patient connected to nasal cannula oxygen ?Cardiovascular status: blood pressure returned to baseline and stable ?Postop Assessment: no apparent nausea or vomiting ?Anesthetic complications: no ? ? ?No notable events documented. ? ?Last Vitals:  ?Vitals:  ? 02/26/22 1116 02/26/22 1117  ?BP: 129/77   ?Pulse: 68 65  ?Resp: 12   ?Temp:    ?SpO2: 96%   ?  ?Last Pain:  ?Vitals:  ? 02/26/22 1116  ?TempSrc:   ?PainSc: 0-No pain  ? ? ?  ?  ?  ?  ?  ?  ? ?March Rummage Cortina Vultaggio ? ? ? ? ?

## 2022-02-26 NOTE — H&P (Signed)
? ? ? ?Chief Complaint: ?Hepatocellular carcinoma ? ?Referring Physician(s): ?Wilfrid Lund, MD ? ?Supervising Physician: Arne Cleveland ? ?Patient Status: [place] ? ?History of Present Illness: ?Donald Aguilar is a 71 y.o. male known to IR service since July of 2021 for numerous paracentesis procedures, an elective TIPS, and cryoablation of of the medial left hepatic lesion.. ? ?Most recently, MR of the liver done 01/22/22 showed hepatic segment 2/3 lesion which is most likely [place]. ? ?He was seen by Dr. Vernard Gambles in consultation to discuss treatment on 01/28/22. ? ?He is here today for microwave ablation. ? ?He is NPO. No nausea/vomiting. No Fever/chills. ROS negative. ? ?Interpreter used to interact with patient. ? ? ?Past Medical History:  ?Diagnosis Date  ? Arthritis   ? knees  ? Ascites 09/09/2019  ? Cirrhosis ([place]) 08/2019  ? with ascites, varices  ? GERD (gastroesophageal reflux disease)   ? 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 History of alcohol abuse   ? Hypotension   ? Hypotension   ? Neuromuscular disorder ([place])   ? peropheral neuropathy  ? ? ?Past Surgical History:  ?Procedure Laterality Date  ? BIOPSY  09/13/2019  ? Procedure: BIOPSY;  Surgeon: Ladene Artist, MD;  Location: [facility] ENDOSCOPY;  Service: Endoscopy;;  ? ESOPHAGOGASTRODUODENOSCOPY (EGD) WITH PROPOFOL N/A 09/13/2019  ? Procedure: ESOPHAGOGASTRODUODENOSCOPY (EGD) WITH PROPOFOL;  Surgeon: Ladene Artist, MD;  Location: [facility] ENDOSCOPY;  Service: Endoscopy;  Laterality: N/A;  ? INGUINAL HERNIA REPAIR Left 12/28/2020  ? Procedure: LEFT INGUINAL HERNIA REPAIR WITH MESH;  Surgeon: Coralie Keens, MD;  Location: [place];  Service: General;  Laterality: Left;  LMA  ? IR PARACENTESIS  09/12/2019  ? IR PARACENTESIS  09/29/2019  ? IR PARACENTESIS  10/19/2019  ? IR PARACENTESIS  11/02/2019  ? IR PARACENTESIS  11/10/2019  ? IR PARACENTESIS  11/22/2019  ? IR PARACENTESIS  12/06/2019  ? IR PARACENTESIS  12/16/2019  ? IR PARACENTESIS  12/23/2019  ? IR PARACENTESIS  12/30/2019   ? IR PARACENTESIS  01/06/2020  ? IR PARACENTESIS  01/16/2020  ? IR PARACENTESIS  02/01/2020  ? IR PARACENTESIS  02/08/2020  ? IR PARACENTESIS  02/28/2020  ? IR PARACENTESIS  03/23/2020  ? IR PARACENTESIS  04/17/2020  ? IR PARACENTESIS  05/02/2020  ? IR PARACENTESIS  05/11/2020  ? IR PARACENTESIS  08/23/2020  ? IR RADIOLOGIST EVAL & MGMT  03/20/2020  ? IR RADIOLOGIST EVAL & MGMT  06/26/2020  ? IR RADIOLOGIST EVAL & MGMT  01/09/2021  ? IR RADIOLOGIST EVAL & MGMT  03/06/2021  ? IR RADIOLOGIST EVAL & MGMT  07/02/2021  ? IR RADIOLOGIST EVAL & MGMT  01/10/2022  ? IR RADIOLOGIST EVAL & MGMT  01/28/2022  ? IR TIPS  05/11/2020  ? RADIOLOGY WITH ANESTHESIA N/A 05/11/2020  ? Procedure: IR WITH ANESTHESIA TRASJUGULAR INTRAHEPATIC PORTOSYSTEMIC SHUNT;  Surgeon: Arne Cleveland, MD;  Location: [place];  Service: Radiology;  Laterality: N/A;  ? RADIOLOGY WITH ANESTHESIA N/A 02/06/2021  ? Procedure: CT CRYO ABLATION;  Surgeon: Arne Cleveland, MD;  Location: [facility];  Service: Radiology;  Laterality: N/A;  ? ? ?Allergies: ?Patient has no known allergies. ? ?Medications: ?Prior to Admission medications   ?Medication Sig Start Date End Date Taking? Authorizing Provider  ?acetaminophen (TYLENOL) 500 MG tablet Take 1,000 mg by mouth every 6 (six) hours as needed for mild pain.    [provider]  ?diphenhydrAMINE-zinc acetate (BENADRYL) cream Apply 1 application topically 3 (three) times daily as  needed for itching.    [provider]  ?furosemide (LASIX) 20 MG tablet Take 1 tablet (20 mg total) by mouth daily. 11/07/21   Rehman, Areeg N, DO  ?spironolactone (ALDACTONE) 50 MG tablet Take 1 tablet (50 mg total) by mouth daily. 11/07/21   Rehman, Areeg N, DO  ?  ? ?Family History  ?Problem Relation Age of Onset  ? Liver cancer Neg Hx   ? Colon cancer Neg Hx   ? Pancreatic cancer Neg Hx   ? Esophageal cancer Neg Hx   ? ? ?Social History  ? ?Socioeconomic History  ? Marital status: Single  ?  Spouse name: Not on file  ? Number of children: Not on  file  ? Years of education: Not on file  ? Highest education level: Not on file  ?Occupational History  ? Not on file  ?Tobacco Use  ? Smoking status: Every Day  ?  Packs/day: 0.50  ?  Years: 50.00  ?  Pack years: 25.00  ?  Types: Cigarettes  ? Smokeless tobacco: Never  ? Tobacco comments:  ?  approximately a couple of packs a week  ?Vaping Use  ? Vaping Use: Never used  ?Substance and Sexual Activity  ? Alcohol use: Not Currently  ?  Comment: last drank in September, 2020  ? Drug use: Never  ? Sexual activity: Not on file  ?Other Topics Concern  ? Not on file  ?Social History Narrative  ? Leave with 3 friends  ? Smoke 2 cigarette  a day  ? ?Social Determinants of Health  ? ?Financial Resource Strain: Not on file  ?Food Insecurity: Not on file  ?Transportation Needs: Not on file  ?Physical Activity: Not on file  ?Stress: Not on file  ?Social Connections: Not on file  ? ? ? ?Review of Systems: A 12 point ROS discussed and pertinent positives are indicated in the HPI above.  All other systems are negative. ? ?Review of Systems ? ?Vital Signs: ?Wt Readings from Last 3 Encounters:  ?02/26/22 148 lb (67.1 kg)  ?02/20/22 148 lb (67.1 kg)  ?11/07/21 150 lb 4.8 oz (68.2 kg)  ? ?Temp Readings from Last 3 Encounters:  ?02/26/22 98.2 ?F (36.8 ?C) (Oral)  ?02/20/22 97.7 ?F (36.5 ?C) (Oral)  ?11/07/21 98.1 ?F (36.7 ?C) (Oral)  ? ?BP Readings from Last 3 Encounters:  ?02/26/22 117/76  ?02/20/22 107/76  ?01/28/22 (!) 112/56  ? ?Pulse Readings from Last 3 Encounters:  ?02/26/22 67  ?02/20/22 75  ?01/28/22 68  ? ? ? ?Physical Exam ?Vitals reviewed.  ?Constitutional:   ?   Appearance: Normal appearance.  ?HENT:  ?   Head: Normocephalic and atraumatic.  ?Eyes:  ?   Extraocular Movements: Extraocular movements intact.  ?Cardiovascular:  ?   Rate and Rhythm: Normal rate and regular rhythm.  ?Pulmonary:  ?   Effort: Pulmonary effort is normal. No respiratory distress.  ?   Breath sounds: Normal breath sounds.  ?Abdominal:  ?   General:  There is no distension.  ?   Palpations: Abdomen is soft.  ?   Tenderness: There is no abdominal tenderness.  ?Musculoskeletal:     ?   General: Normal range of motion.  ?   Cervical back: Normal range of motion.  ?Skin: ?   General: Skin is warm and dry.  ?Neurological:  ?   General: No focal deficit present.  ?   Mental Status: He is alert and oriented to person, place, and time.  ?  Psychiatric:     ?   Mood and Affect: Mood normal.     ?   Behavior: Behavior normal.     ?   Thought Content: Thought content normal.     ?   Judgment: Judgment normal.  ? ? ?Imaging: ?IR Radiologist Eval & Mgmt ? ?Result Date: 01/28/2022 ?Please refer to notes tab for details about interventional procedure. (Op Note)  ? ?Labs: ? ?CBC: ?Recent Labs  ?  06/18/21 ?1043 02/20/22 ?1044 02/26/22 ?0700  ?WBC 7.5 5.9 5.7  ?HGB 14.0 14.8 14.2  ?HCT 40.2 44.2 41.9  ?PLT 176 142* 161  ? ? ?COAGS: ?Recent Labs  ?  06/18/21 ?1043 02/20/22 ?1044  ?INR 1.3* 1.2  ? ? ?BMP: ?Recent Labs  ?  06/18/21 ?1043 11/07/21 ?0915 01/03/22 ?4132 02/20/22 ?1044  ?NA 140 141  --  138  ?K 3.9 4.4  --  4.1  ?CL 103 106  --  109  ?CO2 22 22  --  25  ?GLUCOSE 75 58*  --  80  ?BUN 6* 8  --  12  ?CALCIUM 9.0 9.0  --  9.0  ?CREATININE 0.69* 0.81 0.70 0.77  ?GFRNONAA  --   --   --  >60  ? ? ?LIVER FUNCTION TESTS: ?Recent Labs  ?  06/18/21 ?1043 11/07/21 ?0915 02/20/22 ?1044  ?BILITOT 1.5* 1.6* 1.3*  ?AST 38 40 36  ?ALT '19 22 22  '$ ?ALKPHOS 103 118 77  ?PROT 6.9 7.6 7.5  ?ALBUMIN 3.6* 3.8 3.5  ? ? ?TUMOR MARKERS: ?No results for input(s): AFPTM, CEA, CA199, CHROMGRNA in the last 8760 hours. ? ?Assessment and Plan: ? ?Hepatic segment 2/3 lesion consistent with hepatocellular carcinoma. ? ?Will proceed with image guided microwave ablation today by Dr. Vernard Gambles.  ? ?Risks and benefits discussed with the patient including, but not limited to bleeding, infection, liver failure, bile duct injury, pneumothorax or damage to adjacent structures. ? ?All of the patient's questions were  answered, patient is agreeable to proceed. ?Consent signed and in chart.  ? ? ?Electronically Signed: ?Murrell Redden, [name]   ?02/26/2022, 7:50 AM ? ? ? ? ? ?I spent a total of    15 Minutes in face to fac

## 2022-02-26 NOTE — Progress Notes (Signed)
?  Patient doing well after MW ablation.  ? ?Pain controlled. ? ?Wanting to go home. ? ?Site ok.  ? ?D/C home when tolerates diet and voids. ? ?Murrell Redden [name] ?02/26/2022 ?2:34 PM ? ? ? ? ?

## 2022-02-26 NOTE — Anesthesia Procedure Notes (Signed)
Procedure Name: Intubation ?Date/Time: 02/26/2022 8:39 AM ?Performed by: Lind Covert, CRNA ?Pre-anesthesia Checklist: Patient identified, Emergency Drugs available, Suction available, Patient being monitored and Timeout performed ?Patient Re-evaluated:Patient Re-evaluated prior to induction ?Oxygen Delivery Method: Circle system utilized ?Preoxygenation: Pre-oxygenation with 100% oxygen ?Induction Type: IV induction ?Ventilation: Mask ventilation without difficulty ?Laryngoscope Size: Mac and 3 ?Grade View: Grade II ?Tube type: Oral ?Tube size: 7.5 mm ?Number of attempts: 1 ?Airway Equipment and Method: Stylet ?Placement Confirmation: ETT inserted through vocal cords under direct vision, positive ETCO2 and breath sounds checked- equal and bilateral ?Secured at: 23 cm ?Tube secured with: Tape ?Dental Injury: Teeth and Oropharynx as per pre-operative assessment  ? ? ? ? ?

## 2022-02-26 NOTE — H&P (Deleted)
  The note originally documented on this encounter has been moved the the encounter in which it belongs.  

## 2022-02-26 NOTE — Procedures (Signed)
?  Procedure:  CT/US guided MW ablation of L hepatic mass   ?Preprocedure diagnosis: The encounter diagnosis was Hepatocellular carcinoma ([place]). ? ?Postprocedure diagnosis: same ?EBL:    minimal ?Complications:   none immediate ? ?See full dictation in [facility]. ? ?D. Arne Cleveland MD ?Main # [phone] ?Pager  [phone] ?Mobile [phone] ?  ? ?

## 2022-02-26 NOTE — Transfer of Care (Signed)
Immediate Anesthesia Transfer of Care Note ? ?Patient: Donald Aguilar ? ?Procedure(s) Performed: CT MICROWAVE ABLATION ? ?Patient Location: PACU ? ?Anesthesia Type:General ? ?Level of Consciousness: sedated ? ?Airway & Oxygen Therapy: Patient Spontanous Breathing ? ?Post-op Assessment: Report given to RN and Post -op Vital signs reviewed and stable ? ?Post vital signs: Reviewed and stable ? ?Last Vitals:  ?Vitals Value Taken Time  ?BP 129/79 02/26/22 1026  ?Temp    ?Pulse 75 02/26/22 1027  ?Resp 15 02/26/22 1027  ?SpO2 100 % 02/26/22 1027  ?Vitals shown include unvalidated device data. ? ?Last Pain:  ?Vitals:  ? 02/26/22 0733  ?TempSrc:   ?PainSc: 3   ?   ? ?Patients Stated Pain Goal: 2 (02/26/22 4103) ? ?Complications: No notable events documented. ?

## 2022-02-28 ENCOUNTER — Encounter (HOSPITAL_COMMUNITY): Payer: Self-pay | Admitting: Interventional Radiology

## 2022-03-03 NOTE — Congregational Nurse Program (Signed)
Home visit following call from patient stating he is itching under bandage which was applied after liver ablation on 02/26/2022.  CN removed bandage.  Incisions closed with no redness, drainage or pain.  Applied bandaids and told patient he could clean area around them to remove adhesive.  Scheduled follow-up appointment with Dr. Vernard Gambles for 03/25/2022 at 2:00 pm.  Patient saw Dr. Bonnee Quin today for a tooth extraction.  [name] RN, Congregational Nurse [phone] ?

## 2022-03-19 NOTE — Congregational Nurse Program (Signed)
Home visit with interpreter [name].  Patient called CN regarding medication stating he needed more but could not identify what he needed.  He took last Furosemide yesterday but still has Spironolactone which is not due for refill for 2 weeks.  Called in refill request to [facility] and he will pick up today.  Reminded patient of 03/25/2022 appointment with Dr. Vernard Gambles @ 1:45 pm.  CN will accompany patient to appointment.  [name] RN, Congregational Nurse [phone]

## 2022-03-25 ENCOUNTER — Ambulatory Visit
Admission: RE | Admit: 2022-03-25 | Discharge: 2022-03-25 | Disposition: A | Discharge status: Home / Self Care | Payer: Medicare Other | Source: Ambulatory Visit | Attending: Radiology | Admitting: Radiology

## 2022-03-25 ENCOUNTER — Encounter: Payer: Self-pay | Admitting: *Deleted

## 2022-03-25 ENCOUNTER — Other Ambulatory Visit: Payer: Self-pay | Admitting: Interventional Radiology

## 2022-03-25 DIAGNOSIS — C220 Liver cell carcinoma: Secondary | ICD-10-CM

## 2022-03-25 HISTORY — PX: IR RADIOLOGIST EVAL & MGMT: IMG5224

## 2022-03-25 NOTE — Progress Notes (Signed)
Patient ID: Donald Aguilar, male   DOB: January 10, 1951, 71 y.o.   MRN: [id number]       Chief Complaint: Patient was seen in consultation today for  follow-up percutaneous hepatic lesion microwave ablation Wilfrid Lund, MD at the request of Allred,Darrell K  Referring Physician(s): Wilfrid Lund, MD   History of Present Illness: Donald Aguilar is a 71 y.o. 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 03/02/2020 CTA obtained for preop planning of TIPS, also demonstrated enhancing regions in segment 4 and segment 8 of the liver, nonspecific  05/11/2020 patient went elective uncomplicated TIPS creation with large-volume paracentesis.  This significantly controlled his abdominal ascites.  He has   not needed anymore paracentesis procedures.  No further scrotal swelling/edema. 08/16/2020 follow-up CT shows enlarging 1.2 cm segment 4 liver lesion arterial enhancing LiRADS 5 presumed hepatocellular carcinoma, and 2 smaller lesions in segment 8. 11/23/2020 follow-up CT reveals 2 cm left liver lesion LiRAD 5;  smaller lesions in segment 3 and 8 with some motion degradation  12/25/2020 MR confirms 2.1 cm LiRADS 5 lesion in the left lobe.  The 2 smaller right lobe lesions are degraded by motion. 7/74/1287 had uncomplicated left inguinal hernia repair with mesh. He is being assessed by [facility] for transplant. 02/06/2021 CT-guided core biopsy and cryoablation of medial left hepatic lobe lesion. Surgical pathology positive for well-to moderately differentiated hepatocellular carcinoma. 06/06/2021 CT abdomen showed ablation of the segment 2 lesion without residual disease, with an area inferior to the ablation zone showing venous phase washout characterized LR3.  Small LR 5 lesion suspected along the apex of the TIPS stent. 01/03/2022 CT abdomen shows  3 new areas of arterial phase enhancement 01/22/2022 MR abdomen demonstrates 1.8 cm residual/recurrent tumor in hepatic segment 2 at the inferior margin of the ablation zone extending into segment 3.  There is also a smaller 1.1 cm lesion in the gallbladder fossa. 02/26/2022 patient underwent Technically successful CT and ultrasound-guided percutaneous microwave ablation of the dominant left lobe liver lesion.  He was discharged home the following day.  He presents today for scheduled follow-up.  The Congregational nurse was able to attend today's meeting as well. Today additional history obtained from the patient with the aid of interpreter, and epic chart.   Challenging social situation with limited resources and health literacy.  He denies any symptoms of hepatic encephalopathy.   He has no tenderness in his upper abdomen in the region of recent microwave ablation needle placement.  He has some persistent pain in his right lower quadrant of the abdomen which has been chronic, predates both ablation procedures.  He is eating well.  No scrotal swelling.  Currently not working.      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, varices   GERD (gastroesophageal reflux disease)    Hepatitis C    Genotype 1a   History of alcohol abuse    Hypotension    Hypotension    Neuromuscular disorder ([place])    peropheral neuropathy    Past Surgical History:  Procedure Laterality Date   BIOPSY  09/13/2019   Procedure: BIOPSY;  Surgeon: Ladene Artist, MD;  Location: [facility] ENDOSCOPY;  Service: Endoscopy;;   ESOPHAGOGASTRODUODENOSCOPY (EGD) WITH PROPOFOL N/A 09/13/2019   Procedure:  ESOPHAGOGASTRODUODENOSCOPY (EGD) WITH PROPOFOL;  Surgeon: Ladene Artist, MD;  Location: [facility] ENDOSCOPY;  Service: Endoscopy;  Laterality: N/A;   INGUINAL HERNIA REPAIR Left 12/28/2020   Procedure: LEFT INGUINAL HERNIA REPAIR WITH MESH;  Surgeon:  Coralie Keens, MD;  Location: [place];  Service: General;  Laterality: Left;  LMA   IR PARACENTESIS  09/12/2019   IR PARACENTESIS  09/29/2019   IR PARACENTESIS  10/19/2019   IR PARACENTESIS  11/02/2019   IR PARACENTESIS  11/10/2019   IR PARACENTESIS  11/22/2019   IR PARACENTESIS  12/06/2019   IR PARACENTESIS  12/16/2019   IR PARACENTESIS  12/23/2019   IR PARACENTESIS  12/30/2019   IR PARACENTESIS  01/06/2020   IR PARACENTESIS  01/16/2020   IR PARACENTESIS  02/01/2020   IR PARACENTESIS  02/08/2020   IR PARACENTESIS  02/28/2020   IR PARACENTESIS  03/23/2020   IR PARACENTESIS  04/17/2020   IR PARACENTESIS  05/02/2020   IR PARACENTESIS  05/11/2020   IR PARACENTESIS  08/23/2020   IR RADIOLOGIST EVAL & MGMT  03/20/2020   IR RADIOLOGIST EVAL & MGMT  06/26/2020   IR RADIOLOGIST EVAL & MGMT  01/09/2021   IR RADIOLOGIST EVAL & MGMT  03/06/2021   IR RADIOLOGIST EVAL & MGMT  07/02/2021   IR RADIOLOGIST EVAL & MGMT  01/10/2022   IR RADIOLOGIST EVAL & MGMT  01/28/2022   IR RADIOLOGIST EVAL & MGMT  03/25/2022   IR TIPS  05/11/2020   RADIOLOGY WITH ANESTHESIA N/A 05/11/2020   Procedure: IR WITH ANESTHESIA TRASJUGULAR INTRAHEPATIC PORTOSYSTEMIC SHUNT;  Surgeon: Arne Cleveland, MD;  Location: [facility];  Service: Radiology;  Laterality: N/A;   RADIOLOGY WITH ANESTHESIA N/A 02/06/2021   Procedure: CT CRYO ABLATION;  Surgeon: Arne Cleveland, MD;  Location: [facility];  Service: Radiology;  Laterality: N/A;   RADIOLOGY WITH ANESTHESIA N/A 02/26/2022   Procedure: CT MICROWAVE ABLATION;  Surgeon: Arne Cleveland, MD;  Location: [facility];  Service: Radiology;  Laterality: N/A;    Allergies: Patient has no known allergies.  Medications: Prior to Admission medications   Medication Sig Start Date End Date Taking? Authorizing Provider  acetaminophen (TYLENOL) 500 MG tablet Take 1,000 mg by mouth every 6 (six) hours as needed for mild pain.    [provider]  diphenhydrAMINE-zinc acetate (BENADRYL) cream Apply 1 application  topically 3 (three) times daily as needed for itching.    [provider]  furosemide (LASIX) 20 MG tablet Take 1 tablet (20 mg total) by mouth daily. 11/07/21   Rehman, Areeg N, DO  spironolactone (ALDACTONE) 50 MG tablet Take 1 tablet (50 mg total) by mouth daily. 11/07/21   Rehman, Areeg N, DO     Family History  Problem Relation Age of Onset   Liver cancer Neg Hx    Colon cancer Neg Hx    Pancreatic cancer Neg Hx    Esophageal cancer Neg Hx     Social History   Socioeconomic History   Marital status: Single    Spouse name: Not on file   Number of children: Not on file   Years of education: Not on file   Highest education level: Not on file  Occupational History   Not on file  Tobacco Use   Smoking status: Every Day    Packs/day: 0.50    Years: 50.00    Pack years: 25.00    Types: Cigarettes   Smokeless tobacco: Never   Tobacco comments:    approximately a couple of  packs a week  Vaping Use   Vaping Use: Never used  Substance and Sexual Activity   Alcohol use: Not Currently    Comment: last drank in September, 2020   Drug use: Never   Sexual activity: Not on file  Other Topics Concern   Not on file  Social History Narrative   Leave with 3 friends   Smoke 2 cigarette  a day   Social Determinants of Health   Financial Resource Strain: Not on file  Food Insecurity: Not on file  Transportation Needs: Not on file  Physical Activity: Not on file  Stress: Not on file  Social Connections: Not on file    ECOG Status: 0 - Asymptomatic  Review of Systems: A 12 point ROS discussed and pertinent positives are indicated in the HPI above.  All other systems are negative.  Review of Systems  Vital Signs: BP 110/62 (BP Location: Left Arm)   Pulse 63   SpO2 98%   Physical Exam Constitutional: Oriented to person, place, and time. Well-developed and well-nourished. No distress.   HENT:  Head: Normocephalic and atraumatic.  Eyes: Conjunctivae and EOM are  normal. Right eye exhibits no discharge. Left eye exhibits no discharge. No scleral icterus.  Neck: No JVD present.  Pulmonary/Chest: Effort normal. No stridor. No respiratory distress.  Abdomen: soft, non distended Neurological:  alert and oriented to person, place, and time.  Skin: Skin is warm and dry.  not diaphoretic.  Psychiatric:   normal mood and affect.   behavior is normal. Judgment and thought content normal.     Imaging: DG Chest 1 View  Result Date: 02/26/2022 CLINICAL DATA:  Preop ablation EXAM: CHEST  1 VIEW COMPARISON:  Chest x-ray dated February 14, 2021 FINDINGS: The heart size and mediastinal contours are within normal limits. Both lungs are clear. Old left clavicle fracture and bilateral rib fractures. IMPRESSION: No active disease. Electronically Signed   By: Yetta Glassman M.D.   On: 02/26/2022 08:13   CT GUIDE TISSUE ABLATION  Result Date: 02/26/2022 INDICATION: Ethanol cirrhosis status post tips creation 05/11/2020, ablation of segment 2 hepatocellular carcinoma 02/06/2021, with recurrent lesion in hepatic segment 3. EXAM: CT & US-GUIDED PERCUTANEOUS THERMAL ABLATION OF LIVER LESION ANESTHESIA/SEDATION: General MEDICATIONS: Cefoxitin 2   g IV within 1 hour of procedure start CONTRAST:  None required PROCEDURE: The procedure, risks, benefits, and alternatives were explained to the patient. Questions regarding the procedure were encouraged and answered. The patient understands and consents to the procedure. The patient was placed under general anesthesia. Initial unenhanced CT was performed in a supine position to localize the segment 3 lesion, which was indistinct on noncontrast imaging. Limited ultrasound demonstrated conspicuity of the segment 2 lesion. The procedure was planned. The patient was prepped with chlorhexidine in a sterile fashion, and a sterile drape was applied covering the operative field. A sterile gown and sterile gloves were used for the procedure. A 22 gauge  spinal needle was advanced under ultrasound guidance in expected trajectory of the microwave ablation probe for procedural planning. Once the appropriate trajectory was established, a 15 cm 17 gauge PR MWA percutaneous microwave ablation probe was advanced into the inferior aspect of the hypo attenuating hepatic lesion under real-time ultrasound guidance. CT confirms good needle position. Subsequently, a second 15 cm 17 gauge ablation probe was advanced into the cephalad extent of the lesion. CT confirms good probe position, in comparison with a prior contrast enhanced CT study. Once appropriate positioning was confirmed, a  5-minute ablation was performed. Tract ablation was performed as the microwave ablation probe was removed. Hemostasis was achieved with manual compression. A limited postprocedural scan was performed. Addressing was placed. COMPLICATIONS: None immediate. FINDINGS: The region of the segment 3 lesion was localized on CT, and the lesion could be better distinctly visualized on ultrasound. Under intermittent ultrasound guidance, two 17 gauge PR percutaneous microwave ablation probes inserted into the mass. CT images generated during the procedure to ensure adequate treatment coverage. Post ablation imaging demonstrates an adequate ablation zone and was negative for obvious complication, specifically, no pneumothorax or significant hemorrhage about the ablation site. IMPRESSION: Successful CT guided percutaneous thermal ablation of hepatic segment 3 hepatoma. PLAN: Initial follow-up will be performed in approximately 4 weeks. Electronically Signed   By: Lucrezia Europe M.D.   On: 02/26/2022 11:39   IR Radiologist Eval & Mgmt  Result Date: 03/25/2022 Please refer to notes tab for details about interventional procedure. (Op Note)   Labs:  CBC: Recent Labs    06/18/21 1043 02/20/22 1044 02/26/22 0700  WBC 7.5 5.9 5.7  HGB 14.0 14.8 14.2  HCT 40.2 44.2 41.9  PLT 176 142* 161     COAGS: Recent Labs    06/18/21 1043 02/20/22 1044  INR 1.3* 1.2    BMP: Recent Labs    06/18/21 1043 11/07/21 0915 01/03/22 0938 02/20/22 1044  NA 140 141  --  138  K 3.9 4.4  --  4.1  CL 103 106  --  109  CO2 22 22  --  25  GLUCOSE 75 58*  --  80  BUN 6* 8  --  12  CALCIUM 9.0 9.0  --  9.0  CREATININE 0.69* 0.81 0.70 0.77  GFRNONAA  --   --   --  >60    LIVER FUNCTION TESTS: Recent Labs    06/18/21 1043 11/07/21 0915 02/20/22 1044 02/26/22 0700  BILITOT 1.5* 1.6* 1.3* 1.2  AST 38 40 36 31  ALT '19 22 22 22  '$ ALKPHOS 103 118 77 74  PROT 6.9 7.6 7.5 6.9  ALBUMIN 3.6* 3.8 3.5 3.3*    TUMOR MARKERS: No results for input(s): AFPTM, CEA, CA199, CHROMGRNA in the last 8760 hours.  Assessment and Plan:  My impression is that the patient is doing very well 1 month post   percutaneous microwave ablation of   residual/recurrent left   hepatoma.  He is returned to his usual activities of daily living.  No symptoms at the treatment site.  No evident complication from the most recent procedure.  No current clinical evidence of hepatic failure. We will plan follow-up CT liver without and with contrast in another 2-3 months, and continued surveillance after that.  I will see him back after the scan. I reviewed the situation with the patient, he seems to understand, and had all of his questions answered.  Thank you for this interesting consult.  I greatly enjoyed meeting Kenyada Hy and look forward to participating in their care.  A copy of this report was sent to the requesting provider on this date.  Electronically Signed: Rickard Rhymes 03/25/2022, 4:25 PM   I spent a total of    25 Minutes in face to face in clinical consultation, greater than 50% of which was counseling/coordinating care for hepatocellular carcinoma, status post microwave ablation.

## 2022-04-07 ENCOUNTER — Other Ambulatory Visit: Payer: Self-pay | Admitting: Internal Medicine

## 2022-04-07 ENCOUNTER — Telehealth: Payer: Self-pay

## 2022-04-07 DIAGNOSIS — K7469 Other cirrhosis of liver: Secondary | ICD-10-CM

## 2022-04-07 NOTE — Telephone Encounter (Signed)
Patient called to say he only has 2 pills left. Since he was unable to communicate the name of medicine CN called [facility] who stated he only needs spironolactone.  He has no refills so she sent request to PCP.  Will call patient when ready for pickup.  [name] RN, Congregational Nurse [phone]

## 2022-04-09 NOTE — Congregational Nurse Program (Signed)
Home visit with interpreter [name].  Informed patient verbally and in writing of appointment with [facility] clinic on 04/21/2022 at 10:15 am.  CN will request Medicaid Transportation.  Patient states he has not heard from [place] regarding refill for Spironolactone.  Phone call to pharmacy who stated request was sent to clinic on 04/07/2022 but they have not received new prescription.  They will send another e-mail and CN will call Internal Med. Clinic.  [name] RN, Congregational Nurse [phone]

## 2022-04-21 ENCOUNTER — Ambulatory Visit (INDEPENDENT_AMBULATORY_CARE_PROVIDER_SITE_OTHER): Payer: Medicare Other | Admitting: Student

## 2022-04-21 ENCOUNTER — Other Ambulatory Visit: Payer: Self-pay

## 2022-04-21 ENCOUNTER — Encounter: Payer: Self-pay | Admitting: Student

## 2022-04-21 VITALS — BP 110/65 | HR 70 | Temp 97.7°F | Ht 66.0 in | Wt 147.9 lb

## 2022-04-21 DIAGNOSIS — R42 Dizziness and giddiness: Secondary | ICD-10-CM | POA: Diagnosis present

## 2022-04-21 DIAGNOSIS — F17210 Nicotine dependence, cigarettes, uncomplicated: Secondary | ICD-10-CM

## 2022-04-21 DIAGNOSIS — Z0000 Encounter for general adult medical examination without abnormal findings: Secondary | ICD-10-CM

## 2022-04-21 DIAGNOSIS — K7469 Other cirrhosis of liver: Secondary | ICD-10-CM

## 2022-04-21 MED ORDER — SPIRONOLACTONE 50 MG PO TABS: 50.0000 mg | ORAL_TABLET | Freq: Every day | ORAL | 3 refills | Status: DC

## 2022-04-21 NOTE — Assessment & Plan Note (Signed)
-   He is due for colonoscopy.  Will defer to GI but I do not think he will benefit from colon cancer screening given his life expectancy.

## 2022-04-21 NOTE — Patient Instructions (Signed)
Donald Aguilar,  It was a pleasure seeing you in the clinic today.  Here is a summary what we talked about:  1.  Liver cirrhosis: I am glad that you are doing well with Lasix and spironolactone.  Please continue to take them daily.  I have sent a message to your liver doctor and they will schedule a follow-up appointment for you.  2.  Dizziness: I will obtain a brain MRI to rule out any central causes of your dizziness.  Please return in 3 months, sooner if needed  Take care,  Dr. Alfonse Spruce

## 2022-04-21 NOTE — Assessment & Plan Note (Addendum)
Patient with history of liver cirrhosis and hepatocellular carcinoma is likely secondary to hepatitis C infection.  He underwent TIPS procedure and ablation in 2022 for the medial left lesion.  He underwent another CT-guided ablation in May 2023 for a hepatic segment 3 hepatoma.   Patient was seen by ID and finished treatment for HCV.  They deferred checking HCV quant to GI.  Patient last saw Dr. Loletha Carrow with [facility] in January 2022 and lost to follow-up since then.  He was evaluated by the transplant group at [place] and was denied due to the lack of social support.  Patient report drinking about 3 beers a day but quit 5 years ago.  He continues smoking.  Patient does not have signs of volume overload on exam today.  No LE edema, pulmonary edema or ascites.  He does have mild JVD.  His weight has been stable in the last few months.  No signs of of hepatic encephalopathy.  His last blood work was in May which showed normal platelet and INR.  Albumin of 3.3.  -Continue Lasix 20 mg and spironolactone 50 mg daily -I reached out to his GI for follow-up appointment
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
## 2022-04-23 ENCOUNTER — Telehealth: Payer: Self-pay

## 2022-04-23 NOTE — Telephone Encounter (Signed)
Patient called to find out when he could pick up medicine from [place].  CN called pharmacy who stated they have had Spironolactone ready and waiting for pick-up but apparently patient never got a message it was ready.  States he will pick it up today.  [name] RN, Congregational Nurse [phone]

## 2022-04-24 ENCOUNTER — Telehealth: Payer: Self-pay

## 2022-04-24 NOTE — Telephone Encounter (Signed)
Patient called to state he was not able to pick medication up from [facility] due to lack of transportation but plans to go tomorrow.  Told patient to call if he is unable to secure transportation. [name] RN, Congregational Nurse [phone]

## 2022-04-24 NOTE — Progress Notes (Signed)
Internal Medicine Clinic Attending  Case discussed with the resident at the time of the visit.  We reviewed the resident's history and exam and pertinent patient test results.  I agree with the assessment, diagnosis, and plan of care documented in the resident's note.  

## 2022-04-29 ENCOUNTER — Telehealth: Payer: Self-pay | Admitting: Gastroenterology

## 2022-04-29 NOTE — Telephone Encounter (Signed)
Appointment scheduled.

## 2022-04-29 NOTE — Telephone Encounter (Signed)
-----   Message from [place], MD sent at 04/23/2022 11:59 AM EDT ----- Will do, thanks.  Barbie Haggis, please assist.  Patient speaks [name], and his care has often been coordinated by [name], a Congregational nurse working with that population. Her notes and contact information can be found in the patient's chart.  -HD ----- Message ----- From: Gaylan Gerold, DO Sent: 04/21/2022  11:17 AM EDT To: Doran Stabler, MD  Good morning Dr. Loletha Carrow,  This patient was seen by you for his cirrhosis/HCC.  His last appointment was January 2022.  Can you please help making a follow-up appointment for this gentleman with your office?  Thank you,  Gaylan Gerold

## 2022-05-14 NOTE — Congregational Nurse Program (Signed)
Home visit with interpreter [name] and Med. Student Sabino Dick. Patient taking medications as ordered.  We gave him written information regarding dates and times of next 4 medical appointments. The first appointment is Monday 05/19/22 for MRI of brain for dizziness. [name] talked with patient about MRI procedure. Transportation requested through [place].  He was aware of 2nd appointment on 8/11 for abdominal C-T scan at [facility] but did not know about 8/17 appointment with Dr. Vernard Gambles or 8/22 appointment with Dr. Loletha Carrow.  Medicaid transportation will be requested for all appointments and we will re-visit to discuss future appointments. [name] RN, Congregational Nurse [phone]

## 2022-05-19 ENCOUNTER — Ambulatory Visit (HOSPITAL_COMMUNITY)
Admission: RE | Admit: 2022-05-19 | Discharge: 2022-05-19 | Disposition: A | Discharge status: Home / Self Care | Payer: Medicare Other | Source: Ambulatory Visit | Attending: Internal Medicine | Admitting: Internal Medicine

## 2022-05-19 DIAGNOSIS — R42 Dizziness and giddiness: Secondary | ICD-10-CM | POA: Insufficient documentation

## 2022-05-19 MED ORDER — GADOBUTROL 1 MMOL/ML IV SOLN
6.5000 mL | Freq: Once | INTRAVENOUS | Status: AC | PRN
  Administered 2022-05-19: 6.5 mL via INTRAVENOUS

## 2022-05-27 ENCOUNTER — Other Ambulatory Visit: Payer: Self-pay

## 2022-05-27 DIAGNOSIS — K7469 Other cirrhosis of liver: Secondary | ICD-10-CM

## 2022-05-27 MED ORDER — FUROSEMIDE 20 MG PO TABS: 20.0000 mg | ORAL_TABLET | Freq: Every day | ORAL | 2 refills | Status: DC

## 2022-05-29 ENCOUNTER — Telehealth: Payer: Self-pay

## 2022-05-29 NOTE — Telephone Encounter (Signed)
Phone call to patient after receiving message from Dr. Alfonse Spruce that he had been unable to reach patient to relay results of brain MRI.  CN set up conference call and Dr. Alfonse Spruce explained findings from MRI.  [name] RN, Congregational Nurse [phone]

## 2022-05-30 ENCOUNTER — Ambulatory Visit (HOSPITAL_COMMUNITY)
Admission: RE | Admit: 2022-05-30 | Discharge: 2022-05-30 | Disposition: A | Discharge status: Home / Self Care | Payer: Medicare Other | Source: Ambulatory Visit | Attending: Interventional Radiology | Admitting: Interventional Radiology

## 2022-05-30 DIAGNOSIS — C220 Liver cell carcinoma: Secondary | ICD-10-CM | POA: Insufficient documentation

## 2022-05-30 MED ORDER — IOHEXOL 300 MG/ML  SOLN
100.0000 mL | Freq: Once | INTRAMUSCULAR | Status: AC | PRN
  Administered 2022-05-30: 100 mL via INTRAVENOUS

## 2022-05-30 MED ORDER — SODIUM CHLORIDE (PF) 0.9 % IJ SOLN
INTRAMUSCULAR | Status: AC
  Filled 2022-05-30: qty 50

## 2022-06-05 ENCOUNTER — Encounter: Payer: Self-pay | Admitting: *Deleted

## 2022-06-05 ENCOUNTER — Ambulatory Visit
Admission: RE | Admit: 2022-06-05 | Discharge: 2022-06-05 | Disposition: A | Discharge status: Home / Self Care | Payer: Medicare Other | Source: Ambulatory Visit | Attending: Interventional Radiology | Admitting: Interventional Radiology

## 2022-06-05 DIAGNOSIS — C220 Liver cell carcinoma: Secondary | ICD-10-CM

## 2022-06-05 HISTORY — PX: IR RADIOLOGIST EVAL & MGMT: IMG5224

## 2022-06-05 NOTE — Progress Notes (Signed)
Patient ID: Donald Aguilar, male   DOB: 1950-11-13, 71 y.o.   MRN: [id number]       Chief Complaint: Patient was seen in consultation today for follow-up hepatoma post embolization at the request of Tiffiny Worthy  Referring Physician(s): Wilfrid Lund MD  History of Present Illness: Donald Aguilar is a 71 y.o. 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 03/02/2020 CTA obtained for preop planning of TIPS, also demonstrated enhancing regions in segment 4 and segment 8 of the liver, nonspecific  05/11/2020 patient went elective uncomplicated TIPS creation with large-volume paracentesis.  This significantly controlled his abdominal ascites.  He has   not needed anymore paracentesis procedures.  No further scrotal swelling/edema. 08/16/2020 follow-up CT shows enlarging 1.2 cm segment 4 liver lesion arterial enhancing LiRADS 5 presumed hepatocellular carcinoma, and 2 smaller lesions in segment 8. 11/23/2020 follow-up CT reveals 2 cm left liver lesion LiRAD 5;  smaller lesions in segment 3 and 8 with some motion degradation  12/25/2020 MR confirms 2.1 cm LiRADS 5 lesion in the left lobe.  The 2 smaller right lobe lesions are degraded by motion. 0/24/0973 had uncomplicated left inguinal hernia repair with mesh. He is being assessed by [facility] for transplant. 02/06/2021 CT-guided core biopsy and cryoablation of medial left hepatic lobe lesion. Surgical pathology positive for well-to moderately differentiated hepatocellular carcinoma. 06/06/2021 CT abdomen showed ablation of the segment 2 lesion without residual disease, with an area inferior to the ablation zone showing venous phase washout characterized LR3.  Small LR 5 lesion suspected along the apex of the TIPS stent. 01/03/2022 CT abdomen shows 3 new areas of arterial phase  enhancement 01/22/2022 MR abdomen demonstrates 1.8 cm residual/recurrent tumor in hepatic segment 2 at the inferior margin of the ablation zone extending into segment 3.  There is also a smaller 1.1 cm lesion in the gallbladder fossa. 02/26/2022 patient underwent Technically successful CT and ultrasound-guided percutaneous microwave ablation of the dominant left lobe liver lesion.  He was discharged home the following day. He initially did well postprocedure, without regional tenderness, bleeding, or other apparent complication. 05/30/2022 CT abdomen obtained demonstrating Status post segment 3 ablation. No signs of residual tumor. There is a focal area of arterial phase enhancement within the dome of the right hepatic lobe; this is compatible with LI-RADS category 5 lesion. There is a LI-RADS category 5 lesion within segment 5 of the right hepatic lobe adjacent to the gallbladder fossa. New biliary ductal dilatation distal to the ablation zone defects within segment 2 and 3. 5. 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  He presents today for scheduled follow-up, with translator.  Today additional history obtained from the patient with the aid of interpreter, and epic chart.   Challenging social situation with limited resources and health literacy.  He is currently not working due to his health issues.  He denies any symptoms of hepatic encephalopathy.   He has no tenderness in his upper abdomen in the region of previous   ablation needle placement.  He is eating well.  No scrotal swelling.    Past Medical History:  Diagnosis Date   Arthritis    knees   Ascites 09/09/2019   Cirrhosis ([place]) 08/2019   with ascites, varices   GERD (gastroesophageal reflux disease)    Hepatitis C    Genotype 1a   History of alcohol  abuse    Hypotension    Hypotension    Neuromuscular disorder ([place])    peropheral neuropathy    Past Surgical History:  Procedure Laterality Date   BIOPSY  09/13/2019    Procedure: BIOPSY;  Surgeon: Ladene Artist, MD;  Location: [facility] ENDOSCOPY;  Service: Endoscopy;;   ESOPHAGOGASTRODUODENOSCOPY (EGD) WITH PROPOFOL N/A 09/13/2019   Procedure: ESOPHAGOGASTRODUODENOSCOPY (EGD) WITH PROPOFOL;  Surgeon: Ladene Artist, MD;  Location: [facility] ENDOSCOPY;  Service: Endoscopy;  Laterality: N/A;   INGUINAL HERNIA REPAIR Left 12/28/2020   Procedure: LEFT INGUINAL HERNIA REPAIR WITH MESH;  Surgeon: Coralie Keens, MD;  Location: [place];  Service: General;  Laterality: Left;  LMA   IR PARACENTESIS  09/12/2019   IR PARACENTESIS  09/29/2019   IR PARACENTESIS  10/19/2019   IR PARACENTESIS  11/02/2019   IR PARACENTESIS  11/10/2019   IR PARACENTESIS  11/22/2019   IR PARACENTESIS  12/06/2019   IR PARACENTESIS  12/16/2019   IR PARACENTESIS  12/23/2019   IR PARACENTESIS  12/30/2019   IR PARACENTESIS  01/06/2020   IR PARACENTESIS  01/16/2020   IR PARACENTESIS  02/01/2020   IR PARACENTESIS  02/08/2020   IR PARACENTESIS  02/28/2020   IR PARACENTESIS  03/23/2020   IR PARACENTESIS  04/17/2020   IR PARACENTESIS  05/02/2020   IR PARACENTESIS  05/11/2020   IR PARACENTESIS  08/23/2020   IR RADIOLOGIST EVAL & MGMT  03/20/2020   IR RADIOLOGIST EVAL & MGMT  06/26/2020   IR RADIOLOGIST EVAL & MGMT  01/09/2021   IR RADIOLOGIST EVAL & MGMT  03/06/2021   IR RADIOLOGIST EVAL & MGMT  07/02/2021   IR RADIOLOGIST EVAL & MGMT  01/10/2022   IR RADIOLOGIST EVAL & MGMT  01/28/2022   IR RADIOLOGIST EVAL & MGMT  03/25/2022   IR RADIOLOGIST EVAL & MGMT  06/05/2022   IR TIPS  05/11/2020   RADIOLOGY WITH ANESTHESIA N/A 05/11/2020   Procedure: IR WITH ANESTHESIA TRASJUGULAR INTRAHEPATIC PORTOSYSTEMIC SHUNT;  Surgeon: Arne Cleveland, MD;  Location: [place];  Service: Radiology;  Laterality: N/A;   RADIOLOGY WITH ANESTHESIA N/A 02/06/2021   Procedure: CT CRYO ABLATION;  Surgeon: Arne Cleveland, MD;  Location: [facility];  Service: Radiology;  Laterality: N/A;   RADIOLOGY WITH ANESTHESIA N/A 02/26/2022   Procedure: CT MICROWAVE  ABLATION;  Surgeon: Arne Cleveland, MD;  Location: [facility];  Service: Radiology;  Laterality: N/A;    Allergies: Patient has no known allergies.  Medications: Prior to Admission medications   Medication Sig Start Date End Date Taking? Authorizing Provider  acetaminophen (TYLENOL) 500 MG tablet Take 1,000 mg by mouth every 6 (six) hours as needed for mild pain.    [provider]  diphenhydrAMINE-zinc acetate (BENADRYL) cream Apply 1 application topically 3 (three) times daily as needed for itching.    [provider]  furosemide (LASIX) 20 MG tablet Take 1 tablet (20 mg total) by mouth daily. 05/27/22   Lacinda Axon, MD  spironolactone (ALDACTONE) 50 MG tablet Take 1 tablet (50 mg total) by mouth daily. 04/21/22   Gaylan Gerold, DO     Family History  Problem Relation Age of Onset   Liver cancer Neg Hx    Colon cancer Neg Hx    Pancreatic cancer Neg Hx    Esophageal cancer Neg Hx     Social History   Socioeconomic History   Marital status: Single    Spouse name: Not on file   Number of children: Not on  file   Years of education: Not on file   Highest education level: Not on file  Occupational History   Not on file  Tobacco Use   Smoking status: Every Day    Packs/day: 0.50    Years: 50.00    Total pack years: 25.00    Types: Cigarettes   Smokeless tobacco: Never   Tobacco comments:    approximately a couple of packs a week  Vaping Use   Vaping Use: Never used  Substance and Sexual Activity   Alcohol use: Not Currently    Comment: last drank in September, 2020   Drug use: Never   Sexual activity: Not on file  Other Topics Concern   Not on file  Social History Narrative   Leave with 3 friends   Smoke 2 cigarette  a day   Social Determinants of Health   Financial Resource Strain: Not on file  Food Insecurity: Not on file  Transportation Needs: Not on file  Physical Activity: Not on file  Stress: Not on file  Social Connections: Not on  file    ECOG Status: 2 - Symptomatic, <50% confined to bed     Physical Exam  Constitutional: Oriented to person, place, and time. Well-developed and well-nourished. No distress.   HENT:  Head: Normocephalic and atraumatic.  Eyes: Conjunctivae and EOM are normal. Right eye exhibits no discharge. Left eye exhibits no discharge. No scleral icterus.  Neck: No JVD present.  Pulmonary/Chest: Effort normal. No stridor. No respiratory distress.  Abdomen: soft, non distended Neurological:  alert and oriented to person, place, and time.  Skin: Skin is warm and dry.  not diaphoretic.  Psychiatric:   normal mood and affect.   behavior is normal. Judgment and thought content normal.    Vital Signs: BP 114/68 (BP Location: Left Arm)   Pulse 68   SpO2 99%  Review of Systems Review of Systems: A 12 point ROS discussed and pertinent positives are indicated in the HPI above.  All other systems are negative.      Imaging:  EXAM: CT ABDOMEN AND PELVIS WITHOUT AND WITH CONTRAST  TECHNIQUE: Multidetector CT imaging of the abdomen and pelvis was performed following the standard protocol before and following the bolus administration of intravenous contrast.  RADIATION DOSE REDUCTION: This exam was performed according to the departmental dose-optimization program which includes automated exposure control, adjustment of the mA and/or kV according to patient size and/or use of iterative reconstruction technique.     CONTRAST: 162m OMNIPAQUE IOHEXOL 300 MG/ML SOLN  COMPARISON: MRI 01/22/2022.  FINDINGS: Lower chest: No acute abnormality.  Hepatobiliary:  Morphologic features of the liver compatible with cirrhosis. Right hepatic vein tips.  -Ablation defect involving segment 2 is again noted,, image 26/11. This measures 1.7 x 1.7 by 1.2 cm, image 34/12. No signs of arterial phase enhancement within this defect.  -ablation defect involving segment 3 measures 3.2 x 3.7 by 2.8  cm, image 37/6. No significant arterial phase enhancement identified within the segment 3 ablation defect to suggest residual tumor.  -Within the dome of the right hepatic lobe there is a focal area of arterial phase enhancement measuring 1.8 x 1.5 by 1.9 cm, image 22/5 and image 49/6. No pseudo capsule identified. On the delayed images there is mild washout, image 1/16. Not confidently identified on previous imaging. This is compatible with LI-RADS category 5 lesion.  -Within segment 5 of the right hepatic lobe adjacent to the gallbladder fossa there is a 1.2  by 1.2 by 1.0 cm arterial phase enhancing structure, image 42/6 and image 54/5. There is washout associated with this lesion on the delayed images without pseudo capsule.. Previously this measured 0.9 cm in maximum diameter. Previously characterized as LI-RADS category 5.  Similar appearance biliary ductal dilatation in the liver dome, image 28/11. New biliary ductal dilatation identified distal to the ablation zone defects within segment 2 and 3.  Gallstone is identified within the neck of the gallbladder measuring 8 mm.  Pancreas: Unremarkable. No pancreatic ductal dilatation or surrounding inflammatory changes.  Spleen: Normal in size without focal abnormality.  Adrenals/Urinary Tract: Normal adrenal glands. No nephrolithiasis scratch set no kidney mass or hydronephrosis identified. Nonobstructing calculi noted in the inferior pole of the right kidney measuring up to 3 mm. Urinary bladder is unremarkable.  Stomach/Bowel: Stomach appears normal. No pathologic dilatation of the large or small bowel loops.  Vascular/Lymphatic: Aortic atherosclerosis. No aneurysm. Upper abdominal varicosities identified including distal esophageal varices and gastric varices. No signs of abdominopelvic adenopathy.  Reproductive: Prostate is unremarkable.  Other: No free fluid or fluid collections.  Musculoskeletal: No acute or  significant osseous findings.  IMPRESSION: 1. Status post segment 3 ablation. No signs of residual tumor. 2. There is a focal area of arterial phase enhancement within the dome of the right hepatic lobe adjacent to the gallbladder fossa. This is compatible with LI-RADS category 5 lesion. 3. There is a LI-RADS category 5 lesion within segment 5 of the right hepatic lobe adjacent to the gallbladder fossa. 4. New biliary ductal dilatation distal to the ablation zone defects within segment 2 and 3. 5. 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 6. Gallstone. 7. Nonobstructing right renal calculi. 8. Aortic Atherosclerosis (ICD10-I70.0).   Electronically Signed By: Kerby Moors M.D. On: 06/01/2022 20:15  Labs:  CBC: Recent Labs    06/18/21 1043 02/20/22 1044 02/26/22 0700  WBC 7.5 5.9 5.7  HGB 14.0 14.8 14.2  HCT 40.2 44.2 41.9  PLT 176 142* 161    COAGS: Recent Labs    06/18/21 1043 02/20/22 1044  INR 1.3* 1.2    BMP: Recent Labs    06/18/21 1043 11/07/21 0915 01/03/22 0938 02/20/22 1044  NA 140 141  --  138  K 3.9 4.4  --  4.1  CL 103 106  --  109  CO2 22 22  --  25  GLUCOSE 75 58*  --  80  BUN 6* 8  --  12  CALCIUM 9.0 9.0  --  9.0  CREATININE 0.69* 0.81 0.70 0.77  GFRNONAA  --   --   --  >60    LIVER FUNCTION TESTS: Recent Labs    06/18/21 1043 11/07/21 0915 02/20/22 1044 02/26/22 0700  BILITOT 1.5* 1.6* 1.3* 1.2  AST 38 40 36 31  ALT '19 22 22 22  '$ ALKPHOS 103 118 77 74  PROT 6.9 7.6 7.5 6.9  ALBUMIN 3.6* 3.8 3.5 3.3*    TUMOR MARKERS: No results for input(s): "AFPTM", "CEA", "CA199", "CHROMGRNA" in the last 8760 hours.  Assessment and Plan:  My impression is that this patient is doing well post repeat ablation of residual left lobe hepatocellular carcinoma.  CT looks good with no residual enhancing lesion in the region of treatment.  2 small spots in the right liver are worrisome for small synchronous tumors.  We will plan  close surveillance for now, the segment 5 lesion would be easier to approach but every approach results in some collateral  liver damage and in the setting of his cirrhosis and limited reserve I want to be relatively cautious and balance risk versus benefit.  I reviewed the recent CT with the patient, with the aid of translator.  The patient did seem to understand and did ask appropriate questions.  Plan for CT liver without/with contrast in 3 months, consider approaching these small right lobe lesions if they are clearly enlarging.  Thank you for this interesting consult.  I greatly enjoyed meeting Donald Aguilar and look forward to continuing participation in their care.  A copy of this report was sent to the requesting provider on this date.  Electronically Signed: Rickard Rhymes 06/05/2022, 2:37 PM   I spent a total of    25 Minutes in face to face in clinical consultation, greater than 50% of which was counseling/coordinating care for multifocal hepatocellular carcinoma, post ablation.

## 2022-06-10 ENCOUNTER — Encounter: Payer: Self-pay | Admitting: Gastroenterology

## 2022-06-10 ENCOUNTER — Ambulatory Visit (INDEPENDENT_AMBULATORY_CARE_PROVIDER_SITE_OTHER): Payer: Medicare Other | Admitting: Gastroenterology

## 2022-06-10 VITALS — BP 110/62 | HR 62 | Ht 65.5 in | Wt 144.0 lb

## 2022-06-10 DIAGNOSIS — B182 Chronic viral hepatitis C: Secondary | ICD-10-CM | POA: Diagnosis not present

## 2022-06-10 DIAGNOSIS — Z95828 Presence of other vascular implants and grafts: Secondary | ICD-10-CM | POA: Diagnosis not present

## 2022-06-10 DIAGNOSIS — C220 Liver cell carcinoma: Secondary | ICD-10-CM | POA: Diagnosis not present

## 2022-06-10 DIAGNOSIS — I8510 Secondary esophageal varices without bleeding: Secondary | ICD-10-CM | POA: Diagnosis not present

## 2022-06-10 NOTE — Progress Notes (Signed)
[facility] progress note:  History: Donald Aguilar 06/10/2022  Referring provider: Linward Natal, MD  Reason for consult/chief complaint: Follow-up (Doing alright have some dizziness at times)   Subjective  HPI: 71 year old 30 man with cirrhosis related to hepatitis C and history of refractory ascites underwent TIPS, and was diagnosed with [facility], now status post multiple IR procedures for same. 06/05/22 office note by Dr. Vernard Gambles of IR includes the following: "Dr. Vernard Gambles 06/05/22: My impression is that this patient is doing well post repeat ablation of residual left lobe hepatocellular carcinoma.  CT looks good with no residual enhancing lesion in the region of treatment.  2 small spots in the right liver are worrisome for small synchronous tumors.  We will plan close surveillance for now, the segment 5 lesion would be easier to approach but every approach results in some collateral liver damage and in the setting of his cirrhosis and limited reserve I want to be relatively cautious and balance risk versus benefit.  I reviewed the recent CT with the patient, with the aid of translator.  The patient did seem to understand and did ask appropriate questions.  Plan for CT liver without/with contrast in 3 months, consider approaching these small right lobe lesions if they are clearly enlarging." _________________________ This man was last seen in our office January 2022, after which scrotal ultrasound revealed that instead of edema he had a large inguinal hernia for which she ultimately had surgical repair. He remains on low-dose diuretics and has not required paracentesis since his TIPS. Hepatitis C was treated with 24 weeks of Epclusa with SVR documented on posttreatment PCR. He was also seen for transplant consideration at [facility] hepatology on 04/18/2021.  He was deemed not a transplant candidate at that time without sufficient social support.  Donald Aguilar was seen with  an interpreter today.  He reports generally feeling well, though occasionally is dizzy.  He denies chest pain dyspnea or abdominal pain.  His appetite and weight remain stable as near as he can tell.  No change in bowel habits or rectal bleeding.   Past Medical History: Past Medical History:  Diagnosis Date   Arthritis    knees   Ascites 09/09/2019   Cirrhosis ([place]) 08/2019   with ascites, varices   GERD (gastroesophageal reflux disease)    Hepatitis C    Genotype 1a   History of alcohol abuse    Hypotension    Hypotension    Neuromuscular disorder ([place])    peropheral neuropathy     Past Surgical History: Past Surgical History:  Procedure Laterality Date   BIOPSY  09/13/2019   Procedure: BIOPSY;  Surgeon: Ladene Artist, MD;  Location: [facility] ENDOSCOPY;  Service: Endoscopy;;   ESOPHAGOGASTRODUODENOSCOPY (EGD) WITH PROPOFOL N/A 09/13/2019   Procedure: ESOPHAGOGASTRODUODENOSCOPY (EGD) WITH PROPOFOL;  Surgeon: Ladene Artist, MD;  Location: [facility] ENDOSCOPY;  Service: Endoscopy;  Laterality: N/A;   INGUINAL HERNIA REPAIR Left 12/28/2020   Procedure: LEFT INGUINAL HERNIA REPAIR WITH MESH;  Surgeon: Coralie Keens, MD;  Location: [place];  Service: General;  Laterality: Left;  LMA   IR PARACENTESIS  09/12/2019   IR PARACENTESIS  09/29/2019   IR PARACENTESIS  10/19/2019   IR PARACENTESIS  11/02/2019   IR PARACENTESIS  11/10/2019   IR PARACENTESIS  11/22/2019   IR PARACENTESIS  12/06/2019   IR PARACENTESIS  12/16/2019   IR PARACENTESIS  12/23/2019   IR PARACENTESIS  12/30/2019   IR PARACENTESIS  01/06/2020   IR PARACENTESIS  01/16/2020   IR PARACENTESIS  02/01/2020   IR PARACENTESIS  02/08/2020   IR PARACENTESIS  02/28/2020   IR PARACENTESIS  03/23/2020   IR PARACENTESIS  04/17/2020   IR PARACENTESIS  05/02/2020   IR PARACENTESIS  05/11/2020   IR PARACENTESIS  08/23/2020   IR RADIOLOGIST EVAL & MGMT  03/20/2020   IR RADIOLOGIST EVAL & MGMT  06/26/2020   IR RADIOLOGIST EVAL & MGMT  01/09/2021   IR  RADIOLOGIST EVAL & MGMT  03/06/2021   IR RADIOLOGIST EVAL & MGMT  07/02/2021   IR RADIOLOGIST EVAL & MGMT  01/10/2022   IR RADIOLOGIST EVAL & MGMT  01/28/2022   IR RADIOLOGIST EVAL & MGMT  03/25/2022   IR RADIOLOGIST EVAL & MGMT  06/05/2022   IR TIPS  05/11/2020   RADIOLOGY WITH ANESTHESIA N/A 05/11/2020   Procedure: IR WITH ANESTHESIA TRASJUGULAR INTRAHEPATIC PORTOSYSTEMIC SHUNT;  Surgeon: Arne Cleveland, MD;  Location: [place];  Service: Radiology;  Laterality: N/A;   RADIOLOGY WITH ANESTHESIA N/A 02/06/2021   Procedure: CT CRYO ABLATION;  Surgeon: Arne Cleveland, MD;  Location: [facility];  Service: Radiology;  Laterality: N/A;   RADIOLOGY WITH ANESTHESIA N/A 02/26/2022   Procedure: CT MICROWAVE ABLATION;  Surgeon: Arne Cleveland, MD;  Location: [facility];  Service: Radiology;  Laterality: N/A;     Family History: Family History  Problem Relation Age of Onset   Liver cancer Neg Hx    Colon cancer Neg Hx    Pancreatic cancer Neg Hx    Esophageal cancer Neg Hx     Social History: Social History   Socioeconomic History   Marital status: Single    Spouse name: Not on file   Number of children: Not on file   Years of education: Not on file   Highest education level: Not on file  Occupational History   Not on file  Tobacco Use   Smoking status: Every Day    Packs/day: 0.50    Years: 50.00    Total pack years: 25.00    Types: Cigarettes   Smokeless tobacco: Never   Tobacco comments:    approximately a couple of packs a week  Vaping Use   Vaping Use: Never used  Substance and Sexual Activity   Alcohol use: Not Currently    Comment: last drank in September, 2020   Drug use: Never   Sexual activity: Not on file  Other Topics Concern   Not on file  Social History Narrative   Leave with 3 friends   Smoke 2 cigarette  a day   Social Determinants of Health   Financial Resource Strain: Not on file  Food Insecurity: Not on file  Transportation Needs: Not on file  Physical Activity:  Not on file  Stress: Not on file  Social Connections: Not on file    Allergies: No Known Allergies  Outpatient Meds: Current Outpatient Medications  Medication Sig Dispense Refill   furosemide (LASIX) 20 MG tablet Take 1 tablet (20 mg total) by mouth daily. 90 tablet 2   spironolactone (ALDACTONE) 50 MG tablet Take 1 tablet (50 mg total) by mouth daily. 90 tablet 3   acetaminophen (TYLENOL) 500 MG tablet Take 1,000 mg by mouth every 6 (six) hours as needed for mild pain. (Patient not taking: Reported on 06/10/2022)     diphenhydrAMINE-zinc acetate (BENADRYL) cream Apply 1 application topically 3 (three) times daily as needed for itching. (Patient not taking: Reported on 06/10/2022)     No current  facility-administered medications for this visit.      ___________________________________________________________________ Objective   Exam:  BP 110/62   Pulse 62   Ht 5' 5.5" (1.664 m) Comment: with shoes  Wt 144 lb (65.3 kg)   SpO2 98%   BMI 23.60 kg/m  Wt Readings from Last 3 Encounters:  06/10/22 144 lb (65.3 kg)  04/21/22 147 lb 14.4 oz (67.1 kg)  02/26/22 148 lb (67.1 kg)    General: Chronically ill, sarcopenic appearing man Eyes: sclera anicteric, no redness CV: Regular with murmur, no JVD, no peripheral edema Resp: clear to auscultation bilaterally, normal RR and effort noted GI: soft, no tenderness, with active bowel sounds. No guarding or palpable organomegaly noted. Skin; warm and dry, no rash or jaundice noted Neuro: awake, alert and oriented x 3. Normal gross motor function and fluent speech No asterixis. No scrotal edema/enlargement Labs:     Latest Ref Rng & Units 02/26/2022    7:00 AM 02/20/2022   10:44 AM 06/18/2021   10:43 AM  CBC  WBC 4.0 - 10.5 K/uL 5.7  5.9  7.5   Hemoglobin 13.0 - 17.0 g/dL 14.2  14.8  14.0   Hematocrit 39.0 - 52.0 % 41.9  44.2  40.2   Platelets 150 - 400 K/uL 161  142  176        Latest Ref Rng & Units 02/26/2022    7:00 AM  02/20/2022   10:44 AM 01/03/2022    9:38 AM  CMP  Glucose 70 - 99 mg/dL  80    BUN 8 - 23 mg/dL  12    Creatinine 0.61 - 1.24 mg/dL  0.77  0.70   Sodium 135 - 145 mmol/L  138    Potassium 3.5 - 5.1 mmol/L  4.1    Chloride 98 - 111 mmol/L  109    CO2 22 - 32 mmol/L  25    Calcium 8.9 - 10.3 mg/dL  9.0    Total Protein 6.5 - 8.1 g/dL 6.9  7.5    Total Bilirubin 0.3 - 1.2 mg/dL 1.2  1.3    Alkaline Phos 38 - 126 U/L 74  77    AST 15 - 41 U/L 31  36    ALT 0 - 44 U/L 22  22     Most recent abdominal imaging on file as noted above Assessment: Encounter Diagnoses  Name Primary?   Chronic hepatitis C without hepatic coma (HCC) Yes   Secondary esophageal varices without bleeding (HCC)    S/P TIPS (transjugular intrahepatic portosystemic shunt)    Hepatocellular carcinoma (HCC)     No GI bleeding, no encephalopathy, no volume overload. TIPS appears to be working well, he is under surveillance with IR for his [place].  Plan:  Discontinue spironolactone and furosemide Contact us if he develops fluid in the legs or increasing abdominal girth. See me in 6 months or sooner if needed. I stressed the importance of his regular follow-up with radiology.   Thank you for the courtesy of this consult.  Please call me with any questions or concerns.  Nelida Meuse III  CC: Referring provider noted above

## 2022-06-10 NOTE — Patient Instructions (Addendum)
_______________________________________________________  If you are age 69 or older, your body mass index should be between 23-30. Your Body mass index is 23.6 kg/m. If this is out of the aforementioned range listed, please consider follow up with your Primary Care Provider.  If you are age 62 or younger, your body mass index should be between 19-25. Your Body mass index is 23.6 kg/m. If this is out of the aformentioned range listed, please consider follow up with your Primary Care Provider.   ________________________________________________________  The [facility] providers would like to encourage you to use [facility] to communicate with providers for non-urgent requests or questions.  Due to long hold times on the telephone, sending your provider a message by [facility] may be a faster and more efficient way to get a response.  Please allow 48 business hours for a response.  Please remember that this is for non-urgent requests.  _______________________________________________________  Please stop taking your furosemide and spironolactone.  Please follow up in 6 months. Give [place] a call at [phone] to schedule an appointment.  Please call us with any questions or concerns.   It was a pleasure to see you today!  Thank you for trusting me with your gastrointestinal care!

## 2022-07-02 NOTE — Congregational Nurse Program (Signed)
Home visit with interpreter [name].  Patient reported that approximately 2 weeks ago he was loading a 40 bottle carton of water in the car and the wind blew the door shut against his right wrist causing him to drop the water.  He immediately developed a "knot " on the medial wrist about 1 inch above thumb joint.  He has limited range of motion and pain with movement.  States it is basically no better or worse since incident.  No redness or bruising.  CN called PCP [facility] clinic) to schedule appointment.  First available is 07/10/2022 at 3:45 pm. Patient advised that he could go to Urgent Care but he stated he does not like going there due to wait time and no interpreter.  Suggested he try a wrist splint for support and that he could take tylenol for pain.  [name] RN, Congregational Nurse [phone].  [name] RN, Congregational Nurse [phone]

## 2022-07-03 NOTE — Congregational Nurse Program (Signed)
Home visit to deliver wrist/thumb splint to patient.  He did not have transportation to obtain it for himself.  Demonstrated how to apply splint and he stated it feels good.  Reminded of appointment on 9/21 with Dr. Dema Severin at [facility].  [name] RN, Congregational Nurse [phone]

## 2022-07-10 ENCOUNTER — Other Ambulatory Visit: Payer: Self-pay

## 2022-07-10 ENCOUNTER — Ambulatory Visit (INDEPENDENT_AMBULATORY_CARE_PROVIDER_SITE_OTHER): Payer: Medicare Other

## 2022-07-10 ENCOUNTER — Ambulatory Visit (HOSPITAL_COMMUNITY)
Admission: RE | Admit: 2022-07-10 | Discharge: 2022-07-10 | Disposition: A | Discharge status: Home / Self Care | Payer: Medicare Other | Source: Ambulatory Visit | Attending: Internal Medicine | Admitting: Internal Medicine

## 2022-07-10 VITALS — BP 116/71 | HR 65 | Temp 98.1°F

## 2022-07-10 DIAGNOSIS — M25531 Pain in right wrist: Secondary | ICD-10-CM | POA: Diagnosis present

## 2022-07-10 DIAGNOSIS — R1031 Right lower quadrant pain: Secondary | ICD-10-CM

## 2022-07-10 NOTE — Progress Notes (Signed)
   CC: Wrist pain and abdominal pain  HPI:  [name] is a 71 y.o. with past medical history as below who presents with wrist pain and abdominal pain.  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  Physical Exam:  Vitals:   07/10/22 1532  BP: 116/71  Pulse: 65  Temp: 98.1 F (36.7 C)  TempSrc: Oral  SpO2: 98%   Physical Exam Constitutional:      General: He is not in acute distress. HENT:     Head: Normocephalic and atraumatic.  Eyes:     Extraocular Movements: Extraocular movements intact.  Pulmonary:     Effort: Pulmonary effort is normal.  Abdominal:     General: Abdomen is flat. Bowel sounds are normal. There is no distension.     Palpations: Abdomen is soft. There is no fluid wave.     Tenderness: There is no guarding or rebound.     Comments: Tenderness with moderate palpation just right of umbilicus.  Musculoskeletal:     Comments: Pain to palpation and mild swelling over radial aspect of right wrist.  Pain with radial deviation.  Neurological:     Mental Status: He is alert.      Assessment & Plan:   See Encounters Tab for problem based charting. Abdominal pain Patient presents with abdominal pain at right lower quadrant that has been present for at least 4 months.  He describes pain just to the right of his umbilicus.  Exam with focal tenderness to moderate palpation right of umbilicus.  He denies constipation, diarrhea, nausea, vomiting, or fever, chills.  He is afebrile today.  He has a history of hepatocellular carcinoma status post cryoablation. Abdominal CT in August with lesion in the right hepatic  lobe, biliary ductal dilatation, cirrhosis with stigmata of portal venous hypertension, and gallstone.  Patient is status post TIPS.  Unsure of etiology of current abdominal pain though location of symptoms does not appear to be consistent with hepatobiliary disease.  -CBC, CMP, lipase -Will reach out to GI to see if further work-up is warranted though he was seen a month ago with similar symptoms and not felt to be concerning  Acute pain of right wrist Patient presents with right wrist pain after lifting a box of water bottles.  He has focal tenderness over the radial aspect of his wrist and pain with radial deviation.  Swelling over radial aspect of wrist.  Suspect tendon injury but will obtain imaging given focal tenderness and swelling.  -xr right wrist -continue with brace  -ice as needed for swelling   Patient seen with Dr. Philipp Ovens

## 2022-07-10 NOTE — Patient Instructions (Addendum)
  ng Lorane Gell, hm nay r?t vui ???c g?p ng!  Hm nay chng ta ? th?o lu?n: ?au c? tay: Ti s? g?i cho b?n v? k?t qu? ch?p X-quang c?a b?n. Trong lc ch? ??i, hy ch??m ? khi c?n thi?t ?? gi?m s?ng v s? d?ng n?p n?u n gip gi?m ?au. ?au b?ng: Ti s? th?o lu?n v?n ?? ny v?i bc s? d? dy c?a b?n. Chng ti s? l?y phng th nghi?m v ti s? g?i l?i cho b?n ?? thng bo k?t qu?Marland Kitchen  Hm nay ti ? ??t mua cc phng th nghi?m sau:  ??n ??t hng phng th nghi?m      CMP14 + Kho?ng tr?ng Anion      CBC khng c s? khc bi?t      lipase    Cc bi ki?m tra ???c ??t hng ngy hm nay:  XR c? tay ph?i  Cc gi?i thi?u ???c ??t hng ngy hm nay:  L?nh gi?i thi?u Khng c (cc) l??t gi?i thi?u no ???c yu c?u hm nay    Ti ? ??t mua lo?i thu?c sau/? thay ??i nh?ng lo?i thu?c sau:  D?ng cc lo?i thu?c sau: Khng c thu?c ng?ng s? d?ng.  B?t ??u dng cc lo?i thu?c sau: Khng c ??n ??t hng thu?c lo?i xc ??nh no ???c ??t trong cu?c ch?m trn ny.    Theo di: n?u cc tri?u ch?ng x?u ?i ho?c khng c?i thi?n  Vui lng ??m b?o ??n s?m 15 pht tr??c cu?c h?n ti?p theo c?a b?n. N?u ??n mu?n, b?n c th? ???c yu c?u d?i l?i l?ch.  Chng ti mong ???c g?p b?n l?n sau. Vui lng g?i cho phng khm c?a chng ti theo s? [phone] n?u b?n c b?t k? cu h?i ho?c th?c m?c no. Th?i gian t?t nh?t ?? g?i l Th? Hai-Th? Su, t? 9 gi? sng - 4 gi? chi?u, nh?ng lun c ng??i tr?c 24/7. N?u sau gi? lm vi?c ho?c cu?i tu?n, hy g?i ??n s? ?i?n tho?i chnh c?a b?nh vi?n v yu c?u Bc s? N?i tr N?i tr Tr?c. N?u b?n c?n n?p thm thu?c, vui lng thng bo cho nh thu?c c?a b?n tr??c m?t tu?n v h? s? g?i yu c?u cho chng ti.  C?m ?n b?n ? ?? chng ti tham gia vo vi?c ch?m [place] c?a b?n. Chc b?n nh?ng ?i?u t?t ??p nh?t!  C?m ?n, Linward Natal, MD

## 2022-07-10 NOTE — Assessment & Plan Note (Addendum)
Patient presents with abdominal pain at right lower quadrant that has been present for at least 4 months.  He describes pain just to the right of his umbilicus.  Exam with focal tenderness to moderate palpation right of umbilicus.  He denies constipation, diarrhea, nausea, vomiting, or fever, chills.  He is afebrile today.  He has a history of hepatocellular carcinoma status post cryoablation. Abdominal CT in August with lesion in the right hepatic lobe, biliary ductal dilatation, cirrhosis with stigmata of portal venous hypertension, and gallstone.  Patient is status post TIPS.  Unsure of etiology of current abdominal pain though location of symptoms does not appear to be consistent with hepatobiliary disease.  -CBC, CMP, lipase -Will reach out to GI to see if further work-up is warranted though he was seen a month ago with similar symptoms and not felt to be concerning

## 2022-07-10 NOTE — Assessment & Plan Note (Signed)
Patient presents with right wrist pain after lifting a box of water bottles.  He has focal tenderness over the radial aspect of his wrist and pain with radial deviation.  Swelling over radial aspect of wrist.  Suspect tendon injury but will obtain imaging given focal tenderness and swelling.  -xr right wrist -continue with brace  -ice as needed for swelling

## 2022-07-11 LAB — CMP14 + ANION GAP
ALT: 26 IU/L (ref 0–44)
AST: 57 IU/L — ABNORMAL HIGH (ref 0–40)
Albumin/Globulin Ratio: 1.2 (ref 1.2–2.2)
Albumin: 4 g/dL (ref 3.9–4.9)
Alkaline Phosphatase: 161 IU/L — ABNORMAL HIGH (ref 44–121)
Anion Gap: 16 mmol/L (ref 10.0–18.0)
BUN/Creatinine Ratio: 11 (ref 10–24)
BUN: 8 mg/dL (ref 8–27)
Bilirubin Total: 1.5 mg/dL — ABNORMAL HIGH (ref 0.0–1.2)
CO2: 21 mmol/L (ref 20–29)
Calcium: 8.9 mg/dL (ref 8.6–10.2)
Chloride: 103 mmol/L (ref 96–106)
Creatinine, Ser: 0.75 mg/dL — ABNORMAL LOW (ref 0.76–1.27)
Globulin, Total: 3.3 g/dL (ref 1.5–4.5)
Glucose: 89 mg/dL (ref 70–99)
Potassium: 4.4 mmol/L (ref 3.5–5.2)
Sodium: 140 mmol/L (ref 134–144)
Total Protein: 7.3 g/dL (ref 6.0–8.5)
eGFR: 98 mL/min/{1.73_m2} (ref >59)

## 2022-07-11 LAB — CBC
Hematocrit: 39.1 % (ref 37.5–51.0)
Hemoglobin: 13.9 g/dL (ref 13.0–17.7)
MCH: 32.5 pg (ref 26.6–33.0)
MCHC: 35.5 g/dL (ref 31.5–35.7)
MCV: 91 fL (ref 79–97)
Platelets: 157 10*3/uL (ref 150–450)
RBC: 4.28 x10E6/uL (ref 4.14–5.80)
RDW: 13.2 % (ref 11.6–15.4)
WBC: 6.2 10*3/uL (ref 3.4–10.8)

## 2022-07-11 LAB — LIPASE: Lipase: 47 U/L (ref 13–78)

## 2022-07-16 NOTE — Progress Notes (Signed)
Internal Medicine Clinic Attending   I saw and evaluated the patient.  I personally confirmed the key portions of the history and exam documented by Dr. White and I reviewed pertinent patient test results.  The assessment, diagnosis, and plan were formulated together and I agree with the documentation in the resident's note.  

## 2022-07-28 ENCOUNTER — Telehealth: Payer: Self-pay

## 2022-07-28 NOTE — Telephone Encounter (Signed)
Phone call to patient to remind him of 6 month follow-up dental appointment with Dr. Iantha Fallen on 08/07/2022 at 2:00 pm.

## 2022-08-11 ENCOUNTER — Other Ambulatory Visit: Payer: Self-pay | Admitting: Interventional Radiology

## 2022-08-11 DIAGNOSIS — C220 Liver cell carcinoma: Secondary | ICD-10-CM

## 2022-08-11 NOTE — Congregational Nurse Program (Signed)
Accompanied patient to eye doctor appointment with Dr. Gershon Crane.  No change since last year.  Patient has mild cataracts but no treatment needed.  Optician was able to fix broken arm on glasses.  Return in 1 year.  Told patient Dr. Adron Bene office called to schedule appointment for C-T scan of liver and that it is November 14,.2023 at 12:00 pm.  Also told him not to eat or drink anything after 8:30 am prior to appointment. CN will request Medicaid transportation.  [name] RN, Congregational Nurse [phone]

## 2022-08-16 ENCOUNTER — Ambulatory Visit: Payer: Medicare Other

## 2022-08-16 DIAGNOSIS — Z23 Encounter for immunization: Secondary | ICD-10-CM

## 2022-08-20 NOTE — Congregational Nurse Program (Signed)
CN office visit with interpreter Diu Harshorn assisting.  Patient lost his Medicare card and needed help calling [employer] for replacement.  He also had questions about [employer] benefits.  [employer] intern Su Grand successfully assisted with both issues.  He should receive new Medicare Card within 30 days.  Current food stamp balance is $219.  [name] RN, Congregational Nurse [phone].

## 2022-09-02 ENCOUNTER — Ambulatory Visit (HOSPITAL_COMMUNITY)
Admission: RE | Admit: 2022-09-02 | Discharge: 2022-09-02 | Disposition: A | Discharge status: Home / Self Care | Payer: Medicare Other | Source: Ambulatory Visit | Attending: Interventional Radiology | Admitting: Interventional Radiology

## 2022-09-02 DIAGNOSIS — C220 Liver cell carcinoma: Secondary | ICD-10-CM | POA: Diagnosis not present

## 2022-09-02 MED ORDER — SODIUM CHLORIDE (PF) 0.9 % IJ SOLN
INTRAMUSCULAR | Status: AC
  Filled 2022-09-02: qty 50

## 2022-09-02 MED ORDER — IOHEXOL 300 MG/ML  SOLN
100.0000 mL | Freq: Once | INTRAMUSCULAR | Status: AC | PRN
  Administered 2022-09-02: 100 mL via INTRAVENOUS

## 2022-09-08 NOTE — Progress Notes (Signed)
I have added him to Wed AM GI tumor board call and will plan on attending.

## 2022-09-10 ENCOUNTER — Other Ambulatory Visit: Payer: Self-pay

## 2022-09-10 ENCOUNTER — Other Ambulatory Visit: Payer: Self-pay | Admitting: Interventional Radiology

## 2022-09-10 ENCOUNTER — Other Ambulatory Visit (HOSPITAL_COMMUNITY): Payer: Self-pay | Admitting: Interventional Radiology

## 2022-09-10 DIAGNOSIS — C220 Liver cell carcinoma: Secondary | ICD-10-CM

## 2022-09-10 NOTE — Congregational Nurse Program (Signed)
Home visit with interpreter [name] following phone call from [place] at [facility].  She stated she had been trying to reach Mr. Bachtel by phone without success.  He stated he has not paid his phone bill this month but plans to do it today. CN placed call to [facility] and she explained to patient that Dr. Vernard Gambles wants him to be seen sooner than the 09/24/2022 currently scheduled.  His new appointment is 09/16/2022 @ 10:30 with Dr. Anselm Pancoast [address] This appointment is a consultation regarding potential procedure to treat his liver cancer. He also needs to go to [facility] at [address] for blood work prior to appointment on 11/28. Unable to schedule transportation through Medicaid due to holiday closures.  He stated he will find a ride.   [name] RN, Congregational Nurse [phone]

## 2022-09-10 NOTE — Progress Notes (Signed)
The proposed treatment discussed in conference is for discussion purpose only and is not a binding recommendation.  The patients have not been physically examined, or presented with their treatment options.  Therefore, final treatment plans cannot be decided.  

## 2022-09-12 LAB — COMPLETE METABOLIC PANEL WITH GFR
AG Ratio: 1 (calc) (ref 1.0–2.5)
ALT: 29 U/L (ref 9–46)
AST: 52 U/L — ABNORMAL HIGH (ref 10–35)
Albumin: 3.8 g/dL (ref 3.6–5.1)
Alkaline phosphatase (APISO): 216 U/L — ABNORMAL HIGH (ref 35–144)
BUN: 10 mg/dL (ref 7–25)
CO2: 26 mmol/L (ref 20–32)
Calcium: 8.8 mg/dL (ref 8.6–10.3)
Chloride: 105 mmol/L (ref 98–110)
Creat: 0.85 mg/dL (ref 0.70–1.35)
Globulin: 3.9 g/dL (calc) — ABNORMAL HIGH (ref 1.9–3.7)
Glucose, Bld: 89 mg/dL (ref 65–99)
Potassium: 4.3 mmol/L (ref 3.5–5.3)
Sodium: 137 mmol/L (ref 135–146)
Total Bilirubin: 1.7 mg/dL — ABNORMAL HIGH (ref 0.2–1.2)
Total Protein: 7.7 g/dL (ref 6.1–8.1)
eGFR: 94 mL/min/{1.73_m2} (ref >=60)

## 2022-09-12 LAB — CBC
HCT: 40.5 % (ref 38.5–50.0)
Hemoglobin: 13.9 g/dL (ref 13.2–17.1)
MCH: 32.4 pg (ref 27.0–33.0)
MCHC: 34.3 g/dL (ref 32.0–36.0)
MCV: 94.4 fL (ref 80.0–100.0)
MPV: 11 fL (ref 7.5–12.5)
Platelets: 136 10*3/uL — ABNORMAL LOW (ref 140–400)
RBC: 4.29 Million/uL (ref 4.20–5.80)
RDW: 13.4 % (ref 11.0–15.0)
WBC: 5.5 10*3/uL (ref 3.8–10.8)

## 2022-09-12 LAB — AFP TUMOR MARKER: AFP-Tumor Marker: 34.4 ng/mL — ABNORMAL HIGH (ref <6.1)

## 2022-09-16 ENCOUNTER — Ambulatory Visit
Admission: RE | Admit: 2022-09-16 | Discharge: 2022-09-16 | Disposition: A | Discharge status: Home / Self Care | Payer: Medicare Other | Source: Ambulatory Visit | Attending: Interventional Radiology | Admitting: Interventional Radiology

## 2022-09-16 DIAGNOSIS — C220 Liver cell carcinoma: Secondary | ICD-10-CM

## 2022-09-16 NOTE — Progress Notes (Signed)
Patient ID: Donald Aguilar, male   DOB: 09-01-1951, 71 y.o.   MRN: [id number]       Chief Complaint: Patient was seen in consultation today for hepatocellular carcinoma and cirrhosis.  at the request of Hassell,Daniel  Referring Physician(s): Rochele Raring, Krista Blue  History of Present Illness: Donald Aguilar is a 71 y.o. 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 03/02/2020 CTA obtained for preop planning of TIPS, also demonstrated enhancing regions in segment 4 and segment 8 of the liver, nonspecific  05/11/2020 patient went elective uncomplicated TIPS creation with large-volume paracentesis.  This significantly controlled his abdominal ascites.  He has   not needed anymore paracentesis procedures.  No further scrotal swelling/edema. 08/16/2020 follow-up CT shows enlarging 1.2 cm segment 4 liver lesion arterial enhancing LiRADS 5 presumed hepatocellular carcinoma, and 2 smaller lesions in segment 8. 11/23/2020 follow-up CT reveals 2 cm left liver lesion LiRAD 5;  smaller lesions in segment 3 and 8 with some motion degradation  12/25/2020 MR confirms 2.1 cm LiRADS 5 lesion in the left lobe.  The 2 smaller right lobe lesions are degraded by motion. 7/32/2025 had uncomplicated left inguinal hernia repair with mesh. He is being assessed by [facility] for transplant. 02/06/2021 CT-guided core biopsy and cryoablation of medial left hepatic lobe lesion. Surgical pathology positive for well-to moderately differentiated hepatocellular carcinoma. 06/06/2021 CT abdomen showed ablation of the segment 2 lesion without residual disease, with an area inferior to the ablation zone showing venous phase washout characterized LR3.  Small LR 5 lesion suspected along the apex of the TIPS stent. 01/03/2022 CT abdomen shows 3 new areas of arterial phase enhancement 01/22/2022 MR abdomen demonstrates 1.8 cm residual/recurrent tumor in hepatic segment 2 at the inferior  margin of the ablation zone extending into segment 3.  There is also a smaller 1.1 cm lesion in the gallbladder fossa. 02/26/2022 patient underwent Technically successful CT and ultrasound-guided percutaneous microwave ablation of the dominant left lobe liver lesion.  He was discharged home the following day. He initially did well postprocedure, without regional tenderness, bleeding, or other apparent complication. 05/30/2022 CT abdomen obtained demonstrating Status post segment 3 ablation. No signs of residual tumor. There is a focal area of arterial phase enhancement within the dome of the right hepatic lobe; this is compatible with LI-RADS category 5 lesion. There is a LI-RADS category 5 lesion within segment 5 of the right hepatic lobe adjacent to the gallbladder fossa. New biliary ductal dilatation distal to the ablation zone defects within segment 2 and 3. 5. 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 New infiltrative mass in the dome liver (segment 8) measuring 6.7 cm increased from 1.8 cm consistent with hepatocellular carcinoma.  Patient was discussed at recent GI tumor conference and Dr. Vernard Gambles asked me to discuss Y90 radioembolization with the patient.  He presents today with translator.  Patient essentially takes care of himself.  His main complain is increased right abdominal pain.  No significant change in appetite or weight.  No respiratory symptoms and no urinary or bowel symptoms.  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, varices   GERD (  gastroesophageal reflux disease)    Hepatitis C    Genotype 1a   History of alcohol abuse     Hypotension    Hypotension    Neuromuscular disorder ([place])    peropheral neuropathy    Past Surgical History:  Procedure Laterality Date   BIOPSY  09/13/2019   Procedure: BIOPSY;  Surgeon: Ladene Artist, MD;  Location: [facility] ENDOSCOPY;  Service: Endoscopy;;   ESOPHAGOGASTRODUODENOSCOPY (EGD) WITH PROPOFOL N/A 09/13/2019   Procedure: ESOPHAGOGASTRODUODENOSCOPY (EGD) WITH PROPOFOL;  Surgeon: Ladene Artist, MD;  Location: [facility] ENDOSCOPY;  Service: Endoscopy;  Laterality: N/A;   INGUINAL HERNIA REPAIR Left 12/28/2020   Procedure: LEFT INGUINAL HERNIA REPAIR WITH MESH;  Surgeon: Coralie Keens, MD;  Location: [place];  Service: General;  Laterality: Left;  LMA   IR PARACENTESIS  09/12/2019   IR PARACENTESIS  09/29/2019   IR PARACENTESIS  10/19/2019   IR PARACENTESIS  11/02/2019   IR PARACENTESIS  11/10/2019   IR PARACENTESIS  11/22/2019   IR PARACENTESIS  12/06/2019   IR PARACENTESIS  12/16/2019   IR PARACENTESIS  12/23/2019   IR PARACENTESIS  12/30/2019   IR PARACENTESIS  01/06/2020   IR PARACENTESIS  01/16/2020   IR PARACENTESIS  02/01/2020   IR PARACENTESIS  02/08/2020   IR PARACENTESIS  02/28/2020   IR PARACENTESIS  03/23/2020   IR PARACENTESIS  04/17/2020   IR PARACENTESIS  05/02/2020   IR PARACENTESIS  05/11/2020   IR PARACENTESIS  08/23/2020   IR RADIOLOGIST EVAL & MGMT  03/20/2020   IR RADIOLOGIST EVAL & MGMT  06/26/2020   IR RADIOLOGIST EVAL & MGMT  01/09/2021   IR RADIOLOGIST EVAL & MGMT  03/06/2021   IR RADIOLOGIST EVAL & MGMT  07/02/2021   IR RADIOLOGIST EVAL & MGMT  01/10/2022   IR RADIOLOGIST EVAL & MGMT  01/28/2022   IR RADIOLOGIST EVAL & MGMT  03/25/2022   IR RADIOLOGIST EVAL & MGMT  06/05/2022   IR TIPS  05/11/2020   RADIOLOGY WITH ANESTHESIA N/A 05/11/2020   Procedure: IR WITH ANESTHESIA TRASJUGULAR INTRAHEPATIC PORTOSYSTEMIC SHUNT;  Surgeon: Arne Cleveland, MD;  Location: [place];  Service: Radiology;  Laterality: N/A;   RADIOLOGY WITH ANESTHESIA N/A 02/06/2021   Procedure: CT CRYO  ABLATION;  Surgeon: Arne Cleveland, MD;  Location: [facility];  Service: Radiology;  Laterality: N/A;   RADIOLOGY WITH ANESTHESIA N/A 02/26/2022   Procedure: CT MICROWAVE ABLATION;  Surgeon: Arne Cleveland, MD;  Location: [facility];  Service: Radiology;  Laterality: N/A;    Allergies: Patient has no known allergies.  Medications: Prior to Admission medications   Medication Sig Start Date End Date Taking? Authorizing Provider  acetaminophen (TYLENOL) 500 MG tablet Take 1,000 mg by mouth every 6 (six) hours as needed for mild pain. Patient not taking: Reported on 06/10/2022    [provider]  diphenhydrAMINE-zinc acetate (BENADRYL) cream Apply 1 application topically 3 (three) times daily as needed for itching. Patient not taking: Reported on 06/10/2022    [provider]  furosemide (LASIX) 20 MG tablet Take 1 tablet (20 mg total) by mouth daily. 05/27/22   Lacinda Axon, MD  spironolactone (ALDACTONE) 50 MG tablet Take 1 tablet (50 mg total) by mouth daily. 04/21/22   Gaylan Gerold, DO     Family History  Problem Relation Age of Onset   Liver cancer Neg Hx    Colon cancer Neg Hx    Pancreatic cancer Neg Hx    Esophageal cancer Neg Hx  Social History   Socioeconomic History   Marital status: Single    Spouse name: Not on file   Number of children: Not on file   Years of education: Not on file   Highest education level: Not on file  Occupational History   Not on file  Tobacco Use   Smoking status: Every Day    Packs/day: 0.50    Years: 50.00    Total pack years: 25.00    Types: Cigarettes   Smokeless tobacco: Never   Tobacco comments:    10 cigs per day  Vaping Use   Vaping Use: Never used  Substance and Sexual Activity   Alcohol use: Not Currently    Comment: last drank in September, 2020   Drug use: Never   Sexual activity: Not on file  Other Topics Concern   Not on file  Social History Narrative   Leave with 3 friends   Smoke 2 cigarette  a day    Social Determinants of Health   Financial Resource Strain: Not on file  Food Insecurity: Not on file  Transportation Needs: Not on file  Physical Activity: Not on file  Stress: Not on file  Social Connections: Not on file    ECOG Status: 1 - Symptomatic but completely ambulatory    Review of Systems  Constitutional: Negative.   Respiratory: Negative.    Gastrointestinal:  Positive for abdominal pain.  Genitourinary: Negative.     Vital Signs: BP (!) 140/78 (BP Location: Left Arm)   Pulse 71   SpO2 97%      Physical Exam Constitutional:      Appearance: Normal appearance.  Pulmonary:     Effort: Pulmonary effort is normal.  Abdominal:     General: Abdomen is flat. There is no distension.     Palpations: Abdomen is soft.     Tenderness: There is no abdominal tenderness.  Neurological:     General: No focal deficit present.     Mental Status: He is alert.        Imaging: CT ABDOMEN W WO CONTRAST  Result Date: 09/03/2022 CLINICAL DATA:  Status post liver ablation May 2023. RIGHT-sided abdominal pain. CT 05/30/2022 EXAM: CT ABDOMEN WITHOUT AND WITH CONTRAST TECHNIQUE: Multidetector CT imaging of the abdomen was performed following the standard protocol before and following the bolus administration of intravenous contrast. RADIATION DOSE REDUCTION: This exam was performed according to the departmental dose-optimization program which includes automated exposure control, adjustment of the mA and/or kV according to patient size and/or use of iterative reconstruction technique. CONTRAST:  148m OMNIPAQUE IOHEXOL 300 MG/ML  SOLN COMPARISON:  None Available. FINDINGS: Lower chest:  Lung bases are clear. Hepatobiliary: There is new arterial enhancing tumor thrombus throughout the RIGHT portal vein distribution extending into the dome the RIGHT hepatic lobe primarily involving segments 7 8. (Images 39 through 21 series 6). Tumor thrombus expands the portal veins. For example  portal vein in segment 7 measuring 2.5 cm in diameter. Similar lesion measuring 1.8 cm on image 27/6. There is invasive parenchymal mass in the dome liver (segment 8) measuring 6.2 x 6.7 cm which is new from prior. Previously small hyperenhancing lesion measures 1.8 x 1.4 cm may correlate. Portal venous phase imaging demonstrates contrast in the main portal vein and tips. No ascites. The treatment site in the lateral segment LEFT hepatic lobe posteriorly measures 3.02.2 cm compared to 3.73.2 cm for no interval change. Small gallstones noted. No metastatic adenopathy in the porta hepatis  identified. Pancreas: Normal pancreatic parenchymal intensity. No ductal dilatation or inflammation. Spleen: Normal spleen. Adrenals/urinary tract: Adrenal glands and kidneys are normal. Stomach/Bowel: Stomach and limited of the small bowel is unremarkable Vascular/Lymphatic: Abdominal aortic normal caliber. No retroperitoneal periportal lymphadenopathy. Musculoskeletal: No aggressive osseous lesion IMPRESSION: 1. 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 2. New infiltrative mass in the dome liver (segment 8) measuring 6.7 cm increased from 1.8 cm consistent with hepatocellular carcinoma. 3. The tips shunt appears patent.  No  ascites. 4. Treated lesion LEFT hepatic lobe appears unchanged. Findings conveyed toDANIEL HASSELL on 09/03/2022  at13:45. Electronically Signed   By: Suzy Bouchard M.D.   On: 09/03/2022 13:46    Labs:  CBC: Recent Labs    02/20/22 1044 02/26/22 0700 07/10/22 1643 09/10/22 1056  WBC 5.9 5.7 6.2 5.5  HGB 14.8 14.2 13.9 13.9  HCT 44.2 41.9 39.1 40.5  PLT 142* 161 157 136*    COAGS: Recent Labs    02/20/22 1044  INR 1.2    BMP: Recent Labs    11/07/21 0915 01/03/22 0938 02/20/22 1044  07/10/22 1643 09/10/22 1056  NA 141  --  138 140 137  K 4.4  --  4.1 4.4 4.3  CL 106  --  109 103 105  CO2 22  --  '25 21 26  '$ GLUCOSE 58*  --  80 89 89  BUN 8  --  '12 8 10  '$ CALCIUM 9.0  --  9.0 8.9 8.8  CREATININE 0.81 0.70 0.77 0.75* 0.85  GFRNONAA  --   --  >60  --   --     LIVER FUNCTION TESTS: Recent Labs    11/07/21 0915 02/20/22 1044 02/26/22 0700 07/10/22 1643 09/10/22 1056  BILITOT 1.6* 1.3* 1.2 1.5* 1.7*  AST 40 36 31 57* 52*  ALT '22 22 22 26 29  '$ ALKPHOS 118 77 74 161*  --   PROT 7.6 7.5 6.9 7.3 7.7  ALBUMIN 3.8 3.5 3.3* 4.0  --     TUMOR MARKERS: Recent Labs    09/10/22 1056  AFPTM 34.4*    Assessment and Plan:   71 year old with known hepatocellular carcinoma and status post TIPS and liver directed therapies by Dr. Arne Cleveland.  Recent imaging demonstrates marked progression of disease in the liver particular in the right hepatic lobe.  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 Patient has limited options for liver directed therapy due to the portal vein thrombus.  Fortunately, the patient's liver function has minimally changed and most recent total bilirubin level is 1.7.  Patient is a candidate for Y90 radioembolization based on his current liver function.  This is a challenging situation with the patient's social situation and the language barrier.  I discussed Y90 radioembolization procedure with the patient using a translator and we went over a video of the Y90 radioembolization procedure.  I believe the patient has an understanding of the procedure.  Potential risks of the procedure including bleeding, infection, vascular injury, prolonged fatigue, non-target embolization, worsening liver function and even liver failure.  I also explained that he may not be a candidate for Y90 based on his mapping study and MAA injection. Patient is reportedly going to see Dr. Burr Medico in oncology about systemic treatment options.  We will set  the patient  up for pre-Y 90 mapping and MAA injection.     Electronically Signed: Burman Riis 09/16/2022, 3:49 PM   I spent a total of    25 Minutes in face to face in clinical consultation, greater than 50% of which was counseling/coordinating care for hepatocellular carcinoma.

## 2022-09-17 ENCOUNTER — Other Ambulatory Visit: Payer: Self-pay

## 2022-09-20 ENCOUNTER — Telehealth: Payer: Self-pay

## 2022-09-20 NOTE — Telephone Encounter (Signed)
Patient's roommate Y'Phi Mlo called to state that patient started vomiting "black stuff" yesterday.  CN spoke with patient and advised that he needs to go to ED.  Patient refusing to go because the last time he went he waited for hours and people were screaming and crying so he left.  He apologized but said he is not going after CN suggested calling 911. [name] RN, Congregational Nurse [phone]

## 2022-09-21 ENCOUNTER — Telehealth: Payer: Self-pay

## 2022-09-21 NOTE — Telephone Encounter (Signed)
Phone call to patient's roommate Y'Phi Mlo.  He states patient is feeling a little better today but still vomited some blood this am. He also stated that patient has not eaten in 3 days but he is drinking fluids including water and tea.  CN recommended Gatorade. He got patient to phone and he still refuses to go to ED but states he would be willing to go to doctor's office.  CN advised him she will contact his PCP tomorrow morning.  [name] RN, Congregational Nurse [phone]

## 2022-09-22 ENCOUNTER — Ambulatory Visit (INDEPENDENT_AMBULATORY_CARE_PROVIDER_SITE_OTHER): Payer: Medicare Other | Admitting: Student

## 2022-09-22 ENCOUNTER — Inpatient Hospital Stay (HOSPITAL_COMMUNITY)
Admission: EM | Admit: 2022-09-22 | Discharge: 2022-09-26 | DRG: 314 | Disposition: A | Discharge status: Hospice | Payer: Medicare Other | Attending: Internal Medicine | Admitting: Internal Medicine

## 2022-09-22 ENCOUNTER — Encounter (HOSPITAL_COMMUNITY): Payer: Self-pay | Admitting: Emergency Medicine

## 2022-09-22 ENCOUNTER — Encounter: Payer: Self-pay | Admitting: Student

## 2022-09-22 ENCOUNTER — Other Ambulatory Visit: Payer: Self-pay

## 2022-09-22 ENCOUNTER — Inpatient Hospital Stay (HOSPITAL_COMMUNITY): Payer: Medicare Other

## 2022-09-22 ENCOUNTER — Telehealth: Payer: Self-pay | Admitting: *Deleted

## 2022-09-22 VITALS — BP 93/61 | HR 111 | Temp 98.1°F | Ht 66.0 in | Wt 139.6 lb

## 2022-09-22 DIAGNOSIS — Z91148 Patient's other noncompliance with medication regimen for other reason: Secondary | ICD-10-CM | POA: Diagnosis not present

## 2022-09-22 DIAGNOSIS — I951 Orthostatic hypotension: Secondary | ICD-10-CM | POA: Diagnosis present

## 2022-09-22 DIAGNOSIS — K219 Gastro-esophageal reflux disease without esophagitis: Secondary | ICD-10-CM | POA: Diagnosis present

## 2022-09-22 DIAGNOSIS — K3189 Other diseases of stomach and duodenum: Secondary | ICD-10-CM | POA: Diagnosis present

## 2022-09-22 DIAGNOSIS — K7469 Other cirrhosis of liver: Secondary | ICD-10-CM

## 2022-09-22 DIAGNOSIS — D649 Anemia, unspecified: Secondary | ICD-10-CM | POA: Diagnosis not present

## 2022-09-22 DIAGNOSIS — B182 Chronic viral hepatitis C: Secondary | ICD-10-CM | POA: Diagnosis present

## 2022-09-22 DIAGNOSIS — K766 Portal hypertension: Secondary | ICD-10-CM | POA: Diagnosis present

## 2022-09-22 DIAGNOSIS — D696 Thrombocytopenia, unspecified: Secondary | ICD-10-CM | POA: Diagnosis present

## 2022-09-22 DIAGNOSIS — G629 Polyneuropathy, unspecified: Secondary | ICD-10-CM | POA: Diagnosis present

## 2022-09-22 DIAGNOSIS — K920 Hematemesis: Secondary | ICD-10-CM | POA: Diagnosis not present

## 2022-09-22 DIAGNOSIS — K7031 Alcoholic cirrhosis of liver with ascites: Secondary | ICD-10-CM | POA: Diagnosis present

## 2022-09-22 DIAGNOSIS — Z66 Do not resuscitate: Secondary | ICD-10-CM | POA: Diagnosis not present

## 2022-09-22 DIAGNOSIS — D62 Acute posthemorrhagic anemia: Secondary | ICD-10-CM | POA: Diagnosis present

## 2022-09-22 DIAGNOSIS — Z79899 Other long term (current) drug therapy: Secondary | ICD-10-CM

## 2022-09-22 DIAGNOSIS — I81 Portal vein thrombosis: Secondary | ICD-10-CM | POA: Diagnosis present

## 2022-09-22 DIAGNOSIS — N179 Acute kidney failure, unspecified: Secondary | ICD-10-CM | POA: Diagnosis present

## 2022-09-22 DIAGNOSIS — F17210 Nicotine dependence, cigarettes, uncomplicated: Secondary | ICD-10-CM | POA: Diagnosis present

## 2022-09-22 DIAGNOSIS — K7460 Unspecified cirrhosis of liver: Secondary | ICD-10-CM | POA: Diagnosis present

## 2022-09-22 DIAGNOSIS — I8511 Secondary esophageal varices with bleeding: Secondary | ICD-10-CM | POA: Diagnosis present

## 2022-09-22 DIAGNOSIS — R52 Pain, unspecified: Secondary | ICD-10-CM | POA: Diagnosis not present

## 2022-09-22 DIAGNOSIS — Z515 Encounter for palliative care: Secondary | ICD-10-CM | POA: Diagnosis not present

## 2022-09-22 DIAGNOSIS — Z7189 Other specified counseling: Secondary | ICD-10-CM | POA: Diagnosis not present

## 2022-09-22 DIAGNOSIS — K922 Gastrointestinal hemorrhage, unspecified: Secondary | ICD-10-CM | POA: Diagnosis present

## 2022-09-22 DIAGNOSIS — Y838 Other surgical procedures as the cause of abnormal reaction of the patient, or of later complication, without mention of misadventure at the time of the procedure: Secondary | ICD-10-CM | POA: Diagnosis present

## 2022-09-22 DIAGNOSIS — T82898A Other specified complication of vascular prosthetic devices, implants and grafts, initial encounter: Secondary | ICD-10-CM | POA: Diagnosis present

## 2022-09-22 DIAGNOSIS — Z95828 Presence of other vascular implants and grafts: Secondary | ICD-10-CM | POA: Diagnosis not present

## 2022-09-22 DIAGNOSIS — Z789 Other specified health status: Secondary | ICD-10-CM | POA: Diagnosis not present

## 2022-09-22 DIAGNOSIS — C220 Liver cell carcinoma: Secondary | ICD-10-CM | POA: Diagnosis present

## 2022-09-22 DIAGNOSIS — I8500 Esophageal varices without bleeding: Secondary | ICD-10-CM | POA: Insufficient documentation

## 2022-09-22 LAB — COMPREHENSIVE METABOLIC PANEL
ALT: 61 U/L — ABNORMAL HIGH (ref 0–44)
ALT: 60 U/L — ABNORMAL HIGH (ref 0–44)
AST: 81 U/L — ABNORMAL HIGH (ref 15–41)
AST: 79 U/L — ABNORMAL HIGH (ref 15–41)
Albumin: 2.8 g/dL — ABNORMAL LOW (ref 3.5–5.0)
Albumin: 2.8 g/dL — ABNORMAL LOW (ref 3.5–5.0)
Alkaline Phosphatase: 117 U/L (ref 38–126)
Alkaline Phosphatase: 114 U/L (ref 38–126)
Anion gap: 8 (ref 5–15)
Anion gap: 9 (ref 5–15)
BUN: 52 mg/dL — ABNORMAL HIGH (ref 8–23)
BUN: 53 mg/dL — ABNORMAL HIGH (ref 8–23)
CO2: 22 mmol/L (ref 22–32)
CO2: 21 mmol/L — ABNORMAL LOW (ref 22–32)
Calcium: 7.8 mg/dL — ABNORMAL LOW (ref 8.9–10.3)
Calcium: 7.8 mg/dL — ABNORMAL LOW (ref 8.9–10.3)
Chloride: 106 mmol/L (ref 98–111)
Chloride: 104 mmol/L (ref 98–111)
Creatinine, Ser: 1.14 mg/dL (ref 0.61–1.24)
Creatinine, Ser: 1.26 mg/dL — ABNORMAL HIGH (ref 0.61–1.24)
GFR, Estimated: >60 mL/min (ref >60)
GFR, Estimated: >60 mL/min (ref >60)
Glucose, Bld: 132 mg/dL — ABNORMAL HIGH (ref 70–99)
Glucose, Bld: 135 mg/dL — ABNORMAL HIGH (ref 70–99)
Potassium: 3.6 mmol/L (ref 3.5–5.1)
Potassium: 4 mmol/L (ref 3.5–5.1)
Sodium: 136 mmol/L (ref 135–145)
Sodium: 134 mmol/L — ABNORMAL LOW (ref 135–145)
Total Bilirubin: 0.8 mg/dL (ref 0.3–1.2)
Total Bilirubin: 0.7 mg/dL (ref 0.3–1.2)
Total Protein: 5.7 g/dL — ABNORMAL LOW (ref 6.5–8.1)
Total Protein: 5.8 g/dL — ABNORMAL LOW (ref 6.5–8.1)

## 2022-09-22 LAB — PROTIME-INR
INR: 1.4 — ABNORMAL HIGH (ref 0.8–1.2)
INR: 1.4 — ABNORMAL HIGH (ref 0.8–1.2)
Prothrombin Time: 17.2 seconds — ABNORMAL HIGH (ref 11.4–15.2)
Prothrombin Time: 17.1 seconds — ABNORMAL HIGH (ref 11.4–15.2)

## 2022-09-22 LAB — CBC WITH DIFFERENTIAL/PLATELET
Abs Immature Granulocytes: 0.19 10*3/uL — ABNORMAL HIGH (ref 0.00–0.07)
Basophils Absolute: 0.1 10*3/uL (ref 0.0–0.1)
Basophils Relative: 1 %
Eosinophils Absolute: 0 10*3/uL (ref 0.0–0.5)
Eosinophils Relative: 0 %
HCT: 20.4 % — ABNORMAL LOW (ref 39.0–52.0)
Hemoglobin: 6.5 g/dL — CL (ref 13.0–17.0)
Immature Granulocytes: 1 %
Lymphocytes Relative: 24 %
Lymphs Abs: 3.7 10*3/uL (ref 0.7–4.0)
MCH: 32.7 pg (ref 26.0–34.0)
MCHC: 31.9 g/dL (ref 30.0–36.0)
MCV: 102.5 fL — ABNORMAL HIGH (ref 80.0–100.0)
Monocytes Absolute: 1.3 10*3/uL — ABNORMAL HIGH (ref 0.1–1.0)
Monocytes Relative: 9 %
Neutro Abs: 9.8 10*3/uL — ABNORMAL HIGH (ref 1.7–7.7)
Neutrophils Relative %: 65 %
Platelets: 157 10*3/uL (ref 150–400)
RBC: 1.99 MIL/uL — ABNORMAL LOW (ref 4.22–5.81)
RDW: 15.7 % — ABNORMAL HIGH (ref 11.5–15.5)
WBC: 15.1 10*3/uL — ABNORMAL HIGH (ref 4.0–10.5)
nRBC: 4.1 % — ABNORMAL HIGH (ref 0.0–0.2)

## 2022-09-22 LAB — CBC
HCT: 19.4 % — ABNORMAL LOW (ref 39.0–52.0)
Hemoglobin: 6.3 g/dL — CL (ref 13.0–17.0)
MCH: 32.8 pg (ref 26.0–34.0)
MCHC: 32.5 g/dL (ref 30.0–36.0)
MCV: 101 fL — ABNORMAL HIGH (ref 80.0–100.0)
Platelets: 164 10*3/uL (ref 150–400)
RBC: 1.92 MIL/uL — ABNORMAL LOW (ref 4.22–5.81)
RDW: 15.6 % — ABNORMAL HIGH (ref 11.5–15.5)
WBC: 16.3 10*3/uL — ABNORMAL HIGH (ref 4.0–10.5)
nRBC: 3.9 % — ABNORMAL HIGH (ref 0.0–0.2)

## 2022-09-22 LAB — CBG MONITORING, ED: Glucose-Capillary: 114 mg/dL — ABNORMAL HIGH (ref 70–99)

## 2022-09-22 LAB — LIPASE, BLOOD: Lipase: 59 U/L — ABNORMAL HIGH (ref 11–51)

## 2022-09-22 LAB — PREPARE RBC (CROSSMATCH)

## 2022-09-22 MED ORDER — PANTOPRAZOLE SODIUM 40 MG IV SOLR
40.0000 mg | Freq: Once | INTRAVENOUS | Status: AC
  Administered 2022-09-22: 40 mg via INTRAVENOUS
  Filled 2022-09-22: qty 10

## 2022-09-22 MED ORDER — SODIUM CHLORIDE 0.9 % IV SOLN
1.0000 g | INTRAVENOUS | Status: DC
  Administered 2022-09-22 – 2022-09-23 (×2): 1 g via INTRAVENOUS
  Filled 2022-09-22 (×2): qty 10

## 2022-09-22 MED ORDER — SODIUM CHLORIDE 0.9 % IV SOLN
50.0000 ug/h | INTRAVENOUS | Status: DC
  Administered 2022-09-22 – 2022-09-24 (×6): 50 ug/h via INTRAVENOUS
  Filled 2022-09-22 (×8): qty 1

## 2022-09-22 MED ORDER — OCTREOTIDE LOAD VIA INFUSION
50.0000 ug | Freq: Once | INTRAVENOUS | Status: AC
  Administered 2022-09-22: 50 ug via INTRAVENOUS
  Filled 2022-09-22: qty 25

## 2022-09-22 MED ORDER — SODIUM CHLORIDE 0.9 % IV SOLN
10.0000 mL/h | Freq: Once | INTRAVENOUS | Status: AC
  Administered 2022-09-22: 10 mL/h via INTRAVENOUS

## 2022-09-22 NOTE — ED Provider Notes (Signed)
[facility] EMERGENCY DEPARTMENT Provider Note   CSN: [id number] Arrival date & time: 09/22/22  1423     History  Chief Complaint  Patient presents with   Emesis    Donald Aguilar is a 71 y.o. male.  HPI Patient with history of cirrhosis, known varices and early evaluation due to suspicion for hepatic tumor presents with concern for hematemesis and melena x3 days.  There is some discomfort, but the patient generally complains of weakness.  No obvious precipitant 3 days ago.  Since onset symptoms have been persistent.  Today he went to his clinic, and with concern that he was sent to the ED for evaluation.  Additional details obtained from clinic notes, and GI notes.  The patient is from [place], has no family here locally.  He speaks some English, but also benefits from the assistance of a Optometrist from [employer].  No syncope, no chest pain.  He is amenable to blood transfusion.    Home Medications Prior to Admission medications   Medication Sig Start Date End Date Taking? Authorizing Provider  acetaminophen (TYLENOL) 500 MG tablet Take 1,000 mg by mouth every 6 (six) hours as needed for mild pain. Patient not taking: Reported on 06/10/2022    [provider]  diphenhydrAMINE-zinc acetate (BENADRYL) cream Apply 1 application topically 3 (three) times daily as needed for itching. Patient not taking: Reported on 06/10/2022    [provider]  furosemide (LASIX) 20 MG tablet Take 1 tablet (20 mg total) by mouth daily. 05/27/22   Lacinda Axon, MD  spironolactone (ALDACTONE) 50 MG tablet Take 1 tablet (50 mg total) by mouth daily. 04/21/22   Gaylan Gerold, DO      Allergies    Patient has no known allergies.    Review of Systems   Review of Systems  All other systems reviewed and are negative.   Physical Exam Updated Vital Signs BP 107/63 (BP Location: Left Arm)   Pulse 73   Temp 98.5 F (36.9 C) (Oral)   Resp 18    Ht '5\' 6"'$  (1.676 m)   Wt 63 kg   SpO2 99%   BMI 22.42 kg/m  Physical Exam Vitals and nursing note reviewed.  Constitutional:      General: He is not in acute distress.    Appearance: He is well-developed.     Comments: Chronically ill-appearing thin adult male in no distress, speaking slowly.  HENT:     Head: Normocephalic and atraumatic.  Eyes:     Conjunctiva/sclera: Conjunctivae normal.  Cardiovascular:     Rate and Rhythm: Normal rate and regular rhythm.  Pulmonary:     Effort: Pulmonary effort is normal. No respiratory distress.     Breath sounds: No stridor.  Abdominal:     General: There is no distension.     Comments: Mild epigastric ttp  Skin:    General: Skin is warm and dry.  Neurological:     Mental Status: He is alert and oriented to person, place, and time.     ED Results / Procedures / Treatments   Labs (all labs ordered are listed, but only abnormal results are displayed) Labs Reviewed  COMPREHENSIVE METABOLIC PANEL - Abnormal; Notable for the following components:      Result Value   Sodium 134 (*)    CO2 21 (*)    Glucose, Bld 135 (*)    BUN 53 (*)    Creatinine, Ser 1.26 (*)  Calcium 7.8 (*)    Total Protein 5.8 (*)    Albumin 2.8 (*)    AST 79 (*)    ALT 60 (*)    All other components within normal limits  LIPASE, BLOOD - Abnormal; Notable for the following components:   Lipase 59 (*)    All other components within normal limits  CBC WITH DIFFERENTIAL/PLATELET - Abnormal; Notable for the following components:   WBC 15.1 (*)    RBC 1.99 (*)    Hemoglobin 6.5 (*)    HCT 20.4 (*)    MCV 102.5 (*)    RDW 15.7 (*)    nRBC 4.1 (*)    Neutro Abs 9.8 (*)    Monocytes Absolute 1.3 (*)    Abs Immature Granulocytes 0.19 (*)    All other components within normal limits  PROTIME-INR - Abnormal; Notable for the following components:   Prothrombin Time 17.1 (*)    INR 1.4 (*)    All other components within normal limits  CBG MONITORING, ED -  Abnormal; Notable for the following components:   Glucose-Capillary 114 (*)    All other components within normal limits  HIV ANTIBODY (ROUTINE TESTING W REFLEX)  PROTIME-INR  CBC  COMPREHENSIVE METABOLIC PANEL  TYPE AND SCREEN  PREPARE RBC (CROSSMATCH)    EKG None  Radiology No results found.  Procedures Procedures    Medications Ordered in ED Medications  octreotide (SANDOSTATIN) 2 mcg/mL load via infusion 50 mcg (50 mcg Intravenous Bolus from Bag 09/22/22 2055)    And  octreotide (SANDOSTATIN) 500 mcg in sodium chloride 0.9 % 250 mL (2 mcg/mL) infusion (50 mcg/hr Intravenous New Bag/Given 09/22/22 2053)  cefTRIAXone (ROCEPHIN) 1 g in sodium chloride 0.9 % 100 mL IVPB (0 g Intravenous Stopped 09/22/22 2233)  pantoprazole (PROTONIX) injection 40 mg (40 mg Intravenous Given 09/22/22 2047)  0.9 %  sodium chloride infusion (10 mL/hr Intravenous New Bag/Given 09/22/22 2239)    ED Course/ Medical Decision Making/ A&P This patient with a Hx of cirrhosis, possible hepatic tumor, varices, prior GI bleed presents to the ED for concern of melena, hematemesis, this involves an extensive number of treatment options, and is a complaint that carries with it a high risk of complications and morbidity.    The differential diagnosis includes variceal bleed, progression of disease, including tumor progression, symptomatic anemia   Social Determinants of Health:  Patient is essentially without family, low in this country, limited [place] capacity  Additional history obtained:  Additional history and/or information obtained from IR, and GI notes as well as primary care clinic notes, notable for evaluation of possible new malignancy, prior TIPS procedure, prior paracentesis.  Patient previously does not and did not be a candidate for transplant due to his social status.   After the initial evaluation, orders, including: Labs type and screen transfuse 2 units were initiated.   Patient placed  on Cardiac and Pulse-Oximetry Monitors. The patient was maintained on a cardiac monitor.  The cardiac monitored showed an rhythm of 80 sinus, The patient was also maintained on pulse oximetry. The readings were typically 100% room air normal   On repeat evaluation of the patient stayed the same  Accompanied by in person translator for [facility].  We discussed all findings, the patient's history, indication for transfusion, indication for admission.  Lab Tests:  I personally interpreted labs.  The pertinent results include: Hemoglobin 6.5  Consultations Obtained:  I requested consultation with the gastroenterology,  and after consult is  completed they will follow as a consulting team.  Dispostion / Final MDM:  After consideration of the diagnostic results and the patient's response to treatment, this adult male with multiple medical issues including hepatic dysfunction, cirrhosis, prior melena, prior hematemesis, presents with melena, hematemesis.  Patient is awake and alert, but with his history, he was started on octreotide, received blood transfusion, I discussed this case with ou gastroenterology colleagues, and he was admitted for further monitoring and management.  Final Clinical Impression(s) / ED Diagnoses Final diagnoses:  Gastrointestinal hemorrhage with hematemesis  Symptomatic anemia   CRITICAL CARE Performed by: Carmin Muskrat Total critical care time: 35 minutes Critical care time was exclusive of separately billable procedures and treating other patients. Critical care was necessary to treat or prevent imminent or life-threatening deterioration. Critical care was time spent personally by me on the following activities: development of treatment plan with patient and/or surrogate as well as nursing, discussions with consultants, evaluation of patient's response to treatment, examination of patient, obtaining history from patient or surrogate, ordering and performing  treatments and interventions, ordering and review of laboratory studies, ordering and review of radiographic studies, pulse oximetry and re-evaluation of patient's condition.    Carmin Muskrat, MD 09/22/22 2306

## 2022-09-22 NOTE — Telephone Encounter (Signed)
Call from [place] with patient.  States patient 's roommate called her on Saturday-patient has been vomiting black stuff.  Patient was advised to go to the ER but refused stating that he had had a previous bad experience and did not want to wait in the ER a long time.  This morning the vomiting had decreased patient states feels a little better.  Spoke with Dr. Marlou Sa who agreed that patient should come in to be seen and evaluated.  Patient was given an appointment for this afternoon at 1:15 PM.

## 2022-09-22 NOTE — ED Notes (Signed)
Patient complained about being dizzy when asked to sit up.

## 2022-09-22 NOTE — ED Provider Triage Note (Signed)
Emergency Medicine Provider Triage Evaluation Note  Donald Aguilar , a 71 y.o. male  was evaluated in triage.  Pt complains of hematemesis and melena since Saturday. Has history of HCC and cirrhosis. Having RUQ abdominal pain.  Endorsed dizziness with standing yesterday.  Denies any syncope.  He is currently denying nausea.  No reported fevers.V[facility]ley he was seen today by internal medicine.  He was hypotensive and tachycardic to 115.  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  Review of Systems  Positive: See above Negative:   Physical Exam  BP 93/60   Pulse 89   Temp 98.1 F (36.7 C)   Resp 16   Ht '5\' 6"'$  (1.676 m)   Wt 63 kg   SpO2 99%   BMI 22.42 kg/m  Gen:   Awake, no distress   Resp:  Normal effort  MSK:   Moves extremities without difficulty  Other:  Abdomen is rounded, soft.  Medical Decision Making  Medically screening exam initiated at 2:57 PM.  Appropriate orders placed.  Lorane Gell was informed that the remainder of the evaluation will be completed by another provider, this initial triage assessment does not replace that evaluation, and the importance of remaining in the ED until their evaluation is complete.     Mickie Hillier, [name] 09/22/22 1459

## 2022-09-22 NOTE — Patient Instructions (Addendum)
Thank you, [name] for allowing [place] to provide your care today. Today we discussed your recent history of vomiting blood. We suspect this because esophageal bleeding, which is a complication of your cirhrosis. Because of this and your abnormal vital signs, you need to be evaluated by the emergency department and likely need to be admitted and evaluated by liver doctors. Please head to the [facility] Emergency department and get evaluated.  Feel better.  I have ordered the following labs for you:  Lab Orders         Protime-INR         CMP w Anion Gap (STAT/Sunquest-performed on-site)         CBC no Diff       I will call if any are abnormal. All of your labs can be accessed through "My Chart".   My Chart Access: [email or web address]  Please follow-up after your ED visit.  Please make sure to arrive 15 minutes prior to your next appointment. If you arrive late, you may be asked to reschedule.    We look forward to seeing you next time. Please call our clinic at [phone] if you have any questions or concerns. The best time to call is Monday-Friday from 9am-4pm, but there is someone available 24/7. If after hours or the weekend, call the main hospital number and ask for the Internal Medicine Resident On-Call. If you need medication refills, please notify your pharmacy one week in advance and they will send [place] a request.   Thank you for letting us take part in your care. Wishing you the best!  Romana Juniper, MD 09/22/2022, 2:08 PM [facility] Internal Medicine Resident, PGY-1

## 2022-09-22 NOTE — Assessment & Plan Note (Signed)
Patient is s/p TIPS procedure and s/p ablation of liver lesions, one in 2022 and one in May 2023. Patient has now completed HCV treatemnt and is followed by ID. Patient was seen by [facility] in August 2023. Patient was also evluated by Dr. 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  Therapies being considered: Y90 radioembolization and systemic chemotherapy per chart review. Patient is not a transplant candidate for lack of social support.   Today, patient is not volume overloaded on exam: no evidence of  JVD, LE edema, pulmonary edema, ascites. Review of previous laboratory work: BMP W NL, T bili 1.7, mildly elevated from prior AST 52, ALT 29. CBC: Hemoglobin 13.9, Platelets 136 from 157 2 months ago, INR 1.2 Plan: -Repeat stat CBC, CMP and INR

## 2022-09-22 NOTE — Progress Notes (Signed)
Subjective:  CC: coffee ground emesis  HPI:  [name] is a 71 y.o. male with a past medical history stated below and presents today for coffee ground color emesis. Please see problem based assessment and plan for additional details. Patient's community nurse and interpreter present in the room.  Past Medical History:  Diagnosis Date   Arthritis    knees   Ascites 09/09/2019   Cirrhosis ([place]) 08/2019   with ascites, varices   GERD (gastroesophageal reflux disease)    Hepatitis C    Genotype 1a   History of alcohol abuse    Hypotension    Hypotension    Neuromuscular disorder (HCC)    peropheral neuropathy    Current Outpatient Medications on File Prior to Visit  Medication Sig Dispense Refill   acetaminophen (TYLENOL) 500 MG tablet Take 1,000 mg by mouth every 6 (six) hours as needed for mild pain. (Patient not taking: Reported on 06/10/2022)     diphenhydrAMINE-zinc acetate (BENADRYL) cream Apply 1 application topically 3 (three) times daily as needed for itching. (Patient not taking: Reported on 06/10/2022)     furosemide (LASIX) 20 MG tablet Take 1 tablet (20 mg total) by mouth daily. 90 tablet 2   spironolactone (ALDACTONE) 50 MG tablet Take 1 tablet (50 mg total) by mouth daily. 90 tablet 3   No current facility-administered medications on file prior to visit.    Family History  Problem Relation Age of Onset   Liver cancer Neg Hx    Colon cancer Neg Hx    Pancreatic cancer Neg Hx    Esophageal cancer Neg Hx     Social History   Socioeconomic History   Marital status: Single    Spouse name: Not on file   Number of children: Not on file   Years of education: Not on file   Highest education level: Not on file  Occupational History   Not on file  Tobacco Use   Smoking status: Every Day    Packs/day: 0.50    Years: 50.00    Total pack years: 25.00    Types: Cigarettes   Smokeless tobacco: Never   Tobacco comments:    10 cigs per day  Vaping Use    Vaping Use: Never used  Substance and Sexual Activity   Alcohol use: Not Currently    Comment: last drank in September, 2020   Drug use: Never   Sexual activity: Not on file  Other Topics Concern   Not on file  Social History Narrative   Leave with 3 friends   Smoke 2 cigarette  a day   Social Determinants of Health   Financial Resource Strain: Not on file  Food Insecurity: Not on file  Transportation Needs: Not on file  Physical Activity: Not on file  Stress: Not on file  Social Connections: Not on file  Intimate Partner Violence: Not on file    Review of Systems: ROS negative except for what is noted on the assessment and plan.  Objective:   Vitals:   09/22/22 1314 09/22/22 1332 09/22/22 1333 09/22/22 1334  BP: (!) 91/57 (!) 91/50 (!) 107/58 93/61  Pulse: 89 83 87 (!) 111  Temp: 98.1 F (36.7 C)     TempSrc: Oral     SpO2: 98%     Weight: 139 lb 9.6 oz (63.3 kg)     Height: '5\' 6"'$  (1.676 m)       Physical Exam: Constitutional: ill-appearing male, with evidence  of jaundice,  sitting in chair, in no acute distress HENT: normocephalic atraumatic, mucous membranes moist Eyes: conjunctiva non-erythematous, jaundice Neck: supple Cardiovascular: sinus tachycardia, regular rhythm, no m/r/g Pulmonary/Chest: normal work of breathing on room air, lungs clear to auscultation bilaterally Abdominal: soft, non-tender, non-distended, no fluid wave MSK: normal bulk and tone. No peripheral edema Neurological: alert & oriented x 3, 5/5 strength in bilateral upper and lower extremities, normal gait Skin: warm and dry Psych: appropriate mood and affect     Assessment & Plan:   Esophageal varices (HCC) Esophageal varices Erosive gastritis Patient presenting to clinic with a three day couse of ground coffee emesis and melena associated with lightheadedness, anorexia, and R upper quadrant pain. Patient refued to go to ED as he has had poor exchanges in the past is being  accompanied his Haematologist. Patient repots her has continued to bleed ~2 cup full of blood per mouth and large dark stools. He has been able to tolerate 3-4 bottles of water. No fever, chills, recent GI illnesses, or sick contacts. Patient has not had recent instrumentation. Does not have PPI in medication list. On exam, vitals significant for mild hypotension and tachycardia to 115 without orthostatic signs. No evidence of volume overload and evidence of perfusion per capillary refil and peripheral pulses.   Upon review, EGD in 2020 with Three columns of grade II varices and hypertensive gastropathy. Suspect patient's presentation and mild vital instability is likely in the setting of bleeding esophageal varices. Plan: STAT cbc, CMP, and INR Case discussed with ER charge nurse, patient will be escorted by clinic staff to the Emergency department  Cirrhosis of liver [facility]) Patient is s/p TIPS procedure and s/p ablation of liver lesions, one in 2022 and one in May 2023. Patient has now completed HCV treatemnt and is followed by ID. Patient was seen by [facility] in August 2023. Patient was also evluated by Dr. 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  Therapies being considered: Y90 radioembolization and systemic chemotherapy per chart review. Patient is not a transplant candidate for lack of social support.   Today, patient is not volume overloaded on exam: no evidence of  JVD, LE edema, pulmonary edema, ascites. Review of previous laboratory work: BMP W NL, T bili 1.7, mildly elevated from prior AST 52, ALT 29. CBC: Hemoglobin 13.9, Platelets 136 from 157 2 months ago, INR 1.2 Plan: -Repeat stat CBC, CMP and INR    Patient seen with Dr. Angelia Mould

## 2022-09-22 NOTE — H&P (Incomplete)
Date: 09/22/2022               Patient Name:  Donald Aguilar MRN: [id number]  DOB: 11/13/1950 Age / Sex: 71 y.o., male   PCP: Linward Natal, MD         Medical Service: Internal Medicine Teaching Service         Attending Physician: Dr. Charise Killian, MD    First Contact: Dr. Leigh Aurora, DO  Pager: [phone]  Second Contact: Dr. Idamae Schuller, MD  Pager: [phone]       After Hours (After 5p/  First Contact Pager: [phone]  weekends / holidays): Second Contact Pager: [phone]   Chief Complaint: coffee ground emesis, melena  History of Present Illness:  Donald Aguilar is a 71 y/o male with a pmh of alcohol cirrhosis s/p TIPS, treat HCV, HCC s/p ablation, C/f new [place] with R hepatic vein invasion. He was seen in clinic found to have a critical new low hgb in the setting of coffee ground emesis. Patient did not have montagnard interpretor available and english is very poor. Attempted with vietnamese interpretor but history was limited by understanding. Patient says he has had 2 weeks of melena and coffee ground emesis. Difficult to understand if this is a first time occurrence or not. He endorses associated lightheadedness/dizziness with recent falls when walking and RUQ pain. He denies yellowing of skin, CP, SOB, abdominal distension, weight gain or loss, LE edema. Could not assess recent confusion due to language barrier. Patient says last drink was 10 years ago but per chart review appears to be 2023. He says he smokes 2 packs per day but is stopping now. The quantity was also hard to parse out. He says he has not taken his medicines since September but the reasoning [place] unclear.  Meds:  None  Allergies: Allergies as of 09/22/2022   (No Known Allergies)   Past Medical History:  Diagnosis Date   Arthritis    knees   Ascites 09/09/2019   Cirrhosis ([place]) 08/2019   with ascites, varices   GERD (gastroesophageal reflux disease)    Hepatitis C    Genotype 1a   History of alcohol abuse     Hypotension    Hypotension    Neuromuscular disorder (HCC)    peropheral neuropathy    Family History:  Family History  Problem Relation Age of Onset   Liver cancer Neg Hx    Colon cancer Neg Hx    Pancreatic cancer Neg Hx    Esophageal cancer Neg Hx      Social History:  Social History   Socioeconomic History   Marital status: Single    Spouse name: Not on file   Number of children: Not on file   Years of education: Not on file   Highest education level: Not on file  Occupational History   Not on file  Tobacco Use   Smoking status: Every Day    Packs/day: 0.50    Years: 50.00    Total pack years: 25.00    Types: Cigarettes   Smokeless tobacco: Never   Tobacco comments:    10 cigs per day  Vaping Use   Vaping Use: Never used  Substance and Sexual Activity   Alcohol use: Not Currently    Comment: last drank in September, 2020   Drug use: Never   Sexual activity: Not on file  Other Topics Concern   Not on file  Social History Narrative   Leave  with 3 friends   Smoke 2 cigarette  a day   Social Determinants of Radio broadcast assistant Strain: Not on file  Food Insecurity: Not on file  Transportation Needs: Not on file  Physical Activity: Not on file  Stress: Not on file  Social Connections: Not on file  Intimate Partner Violence: Not on file     Review of Systems: A complete ROS was negative except as per HPI.   Physical Exam: Blood pressure 118/66, pulse 70, temperature 97.6 F (36.4 C), temperature source Oral, resp. rate 17, height '5\' 6"'$  (1.676 m), weight 63 kg, SpO2 98 %. Gen: Chronically ill appearing, no acute distress, laying in bed, pleasant and cooperative HEENT: temporal wasting, scleral icterus, conjunctival pallor, dry mucous membranes CV: RRR, normal s1/s2, no m/r/g, 2+ bilateral radial and DP pulses, cap refill >2 seconds Pulm: Normal wob, LCTAB Abd: soft, ptp over the epigastrium, non-distended, diffusely tympanitic with no evidence  of fluid wave Extremities: warm, dry, no LE edema Skin: no juandice, no palmar erythema, no caput medussa Neuro: alert, no asterixis  Assessment & Plan by Problem: Active Problems:   * No active hospital problems. * Donald Aguilar is a 71 y/o male with a pmh of alcohol cirrhosis s/p TIPS, treat HCV, HCC s/p ablation, C/f new HCC with R hepatic vein invasion presenting with coffee ground emesis and melena.  Coffee ground emesisAlcohol Cirrhosis s/p TIPS 2021 Chronic HCC s/p treatment Hx of grade II esophageal varices Hemoglobin found to be 6.3 in the [facility] today, last normal at 13.9 12 days ago in the setting of coffee ground emesis and melena for 2 weeks. Suspect variceal bleeding vs portal hypertensive gastropathy especially given a history of both on most recent EGD in 08/2019. Not on a beta blocker at baseline due to hypotension in the past. Spironolactone and lasix stopped a few months ago, no evidence of ascites on exam. No asterixis to suggest hepatic encephalopathy and no evidence of confusion. Will evaluate for patency of TIPS that was placed in 2021 given new evidence of variceal bleeding. 2 Large bore IV, IV PPI, octreatide and 2 u pRBC in the ED. INR elevated to 1.4, albumin decreased to 2.8, AST/ALT elevated to 79/60, bilirubin wnl at 0.7 with MELDNA of 15. -2 large bore IV -s/P IV protonix '40mg'$  once, redose q12 hours until endoscopy -Ceftriaxone 1g daily x7 days -IV Octreotide -Daily cbc,Hgb<7 transfuse -GI consult -F/U Liver US with doppler -RD consult -PT/OT  AKI Orthostatic hypotension Cr 0.7-0.9 baseline, now increased to 1.26 in the setting of coffee ground emesis and melena. BUN: Cr of 42 suggestive of prerenal AKI in the setting of poor PO intake and UGIB. BUN also elevated in the setting of UGIB. Also found to be orthostatic with BP 115/60 lying, 93/60 sitting. Will see how he responds with the 2 u pRBCs and can consider more fluids if still orthostatic or having hypotension  at baseline. -daily cmp  Hx well differentiated HCC s/p ablation C/f New segment 8 HCC with R hepatic vein involvement Planning Y-90 radioembolization with IR.   Dispo: Admit patient to Inpatient with expected length of stay greater than 2 midnights.  Signed: Iona Coach, MD 09/22/2022, 7:08 PM  Pager: [phone] After 5pm on weekdays and 1pm on weekends: On Call pager: [phone]

## 2022-09-22 NOTE — ED Triage Notes (Signed)
Pt arrives via POV with reports of vomiting blood on Friday that has continued today. Pt points to right side of abd for pain. Hx of liver cirrhosis.

## 2022-09-22 NOTE — Congregational Nurse Program (Addendum)
CN scheduled and accompanied patient to appointment at [place] for evaluation of vomiting blood and having bloody stools for 3 days.  Patient also states he has not eaten or slept for 3 days.  He is very weak and c/o dizziness when standing.  Doctor told him he could die if bleeding not controlled. Patient referred to ED for admission.  [name] RN, Congregational Nurse [phone]

## 2022-09-22 NOTE — Progress Notes (Signed)
Patient is currently being evaluated in the Emergency Department. Relayed results to the triaging provider by phone, especially critical Hgb of 6.3 from 13.9 12 days ago.

## 2022-09-22 NOTE — Assessment & Plan Note (Signed)
Esophageal varices Erosive gastritis Patient presenting to clinic with a three day couse of ground coffee emesis and melena associated with lightheadedness, anorexia, and R upper quadrant pain. Patient refued to go to ED as he has had poor exchanges in the past is being accompanied his Haematologist. Patient repots her has continued to bleed ~2 cup full of blood per mouth and large dark stools. He has been able to tolerate 3-4 bottles of water. No fever, chills, recent GI illnesses, or sick contacts. Patient has not had recent instrumentation. Does not have PPI in medication list. On exam, vitals significant for mild hypotension and tachycardia to 115 without orthostatic signs. No evidence of volume overload and evidence of perfusion per capillary refil and peripheral pulses.   Upon review, EGD in 2020 with Three columns of grade II varices and hypertensive gastropathy. Suspect patient's presentation and mild vital instability is likely in the setting of bleeding esophageal varices. Plan: STAT cbc, CMP, and INR Case discussed with ER charge nurse, patient will be escorted by clinic staff to the Emergency department

## 2022-09-23 ENCOUNTER — Other Ambulatory Visit: Payer: Self-pay

## 2022-09-23 ENCOUNTER — Inpatient Hospital Stay (HOSPITAL_COMMUNITY): Payer: Medicare Other

## 2022-09-23 ENCOUNTER — Telehealth: Payer: Self-pay

## 2022-09-23 DIAGNOSIS — D62 Acute posthemorrhagic anemia: Secondary | ICD-10-CM | POA: Diagnosis not present

## 2022-09-23 DIAGNOSIS — K922 Gastrointestinal hemorrhage, unspecified: Secondary | ICD-10-CM

## 2022-09-23 DIAGNOSIS — I81 Portal vein thrombosis: Secondary | ICD-10-CM

## 2022-09-23 DIAGNOSIS — C220 Liver cell carcinoma: Secondary | ICD-10-CM

## 2022-09-23 DIAGNOSIS — Z95828 Presence of other vascular implants and grafts: Secondary | ICD-10-CM

## 2022-09-23 DIAGNOSIS — K7031 Alcoholic cirrhosis of liver with ascites: Secondary | ICD-10-CM

## 2022-09-23 DIAGNOSIS — Z515 Encounter for palliative care: Secondary | ICD-10-CM

## 2022-09-23 LAB — CBC
HCT: 23.8 % — ABNORMAL LOW (ref 39.0–52.0)
HCT: 22.8 % — ABNORMAL LOW (ref 39.0–52.0)
HCT: 24.6 % — ABNORMAL LOW (ref 39.0–52.0)
Hemoglobin: 7.8 g/dL — ABNORMAL LOW (ref 13.0–17.0)
Hemoglobin: 7.9 g/dL — ABNORMAL LOW (ref 13.0–17.0)
Hemoglobin: 8.3 g/dL — ABNORMAL LOW (ref 13.0–17.0)
MCH: 31.1 pg (ref 26.0–34.0)
MCH: 32.2 pg (ref 26.0–34.0)
MCH: 31.7 pg (ref 26.0–34.0)
MCHC: 32.8 g/dL (ref 30.0–36.0)
MCHC: 34.6 g/dL (ref 30.0–36.0)
MCHC: 33.7 g/dL (ref 30.0–36.0)
MCV: 94.8 fL (ref 80.0–100.0)
MCV: 93.1 fL (ref 80.0–100.0)
MCV: 93.9 fL (ref 80.0–100.0)
Platelets: 106 10*3/uL — ABNORMAL LOW (ref 150–400)
Platelets: 125 10*3/uL — ABNORMAL LOW (ref 150–400)
Platelets: 114 10*3/uL — ABNORMAL LOW (ref 150–400)
RBC: 2.51 MIL/uL — ABNORMAL LOW (ref 4.22–5.81)
RBC: 2.45 MIL/uL — ABNORMAL LOW (ref 4.22–5.81)
RBC: 2.62 MIL/uL — ABNORMAL LOW (ref 4.22–5.81)
RDW: 17.6 % — ABNORMAL HIGH (ref 11.5–15.5)
RDW: 18.6 % — ABNORMAL HIGH (ref 11.5–15.5)
RDW: 18.8 % — ABNORMAL HIGH (ref 11.5–15.5)
WBC: 9.1 10*3/uL (ref 4.0–10.5)
WBC: 8.7 10*3/uL (ref 4.0–10.5)
WBC: 9.1 10*3/uL (ref 4.0–10.5)
nRBC: 5.5 % — ABNORMAL HIGH (ref 0.0–0.2)
nRBC: 5.6 % — ABNORMAL HIGH (ref 0.0–0.2)
nRBC: 6 % — ABNORMAL HIGH (ref 0.0–0.2)

## 2022-09-23 LAB — COMPREHENSIVE METABOLIC PANEL
ALT: 45 U/L — ABNORMAL HIGH (ref 0–44)
AST: 59 U/L — ABNORMAL HIGH (ref 15–41)
Albumin: 2.4 g/dL — ABNORMAL LOW (ref 3.5–5.0)
Alkaline Phosphatase: 92 U/L (ref 38–126)
Anion gap: 8 (ref 5–15)
BUN: 35 mg/dL — ABNORMAL HIGH (ref 8–23)
CO2: 20 mmol/L — ABNORMAL LOW (ref 22–32)
Calcium: 7.6 mg/dL — ABNORMAL LOW (ref 8.9–10.3)
Chloride: 107 mmol/L (ref 98–111)
Creatinine, Ser: 0.95 mg/dL (ref 0.61–1.24)
GFR, Estimated: >60 mL/min (ref >60)
Glucose, Bld: 117 mg/dL — ABNORMAL HIGH (ref 70–99)
Potassium: 3.6 mmol/L (ref 3.5–5.1)
Sodium: 135 mmol/L (ref 135–145)
Total Bilirubin: 1.9 mg/dL — ABNORMAL HIGH (ref 0.3–1.2)
Total Protein: 5 g/dL — ABNORMAL LOW (ref 6.5–8.1)

## 2022-09-23 LAB — PROTIME-INR
INR: 1.4 — ABNORMAL HIGH (ref 0.8–1.2)
Prothrombin Time: 17 seconds — ABNORMAL HIGH (ref 11.4–15.2)

## 2022-09-23 LAB — HIV ANTIBODY (ROUTINE TESTING W REFLEX): HIV Screen 4th Generation wRfx: NONREACTIVE

## 2022-09-23 MED ORDER — PANTOPRAZOLE SODIUM 40 MG IV SOLR
40.0000 mg | Freq: Two times a day (BID) | INTRAVENOUS | Status: DC
  Administered 2022-09-23 – 2022-09-25 (×5): 40 mg via INTRAVENOUS
  Filled 2022-09-23 (×5): qty 10

## 2022-09-23 MED ORDER — IOHEXOL 350 MG/ML SOLN
80.0000 mL | Freq: Once | INTRAVENOUS | Status: AC | PRN
  Administered 2022-09-23: 80 mL via INTRAVENOUS

## 2022-09-23 NOTE — ED Notes (Signed)
Receiving  blood 0315 LAB

## 2022-09-23 NOTE — ED Notes (Addendum)
Interpreter available onsite daily. 09-24-22 '@0930'$ , 09/25/22 '@0800'$ , 09/26/22 '@0800'$ , 09/27/22 '@1000'$ , 09/28/22 '@0800'$ . After hours and weekends pager [phone]. By phone [phone] through Friday-Snow [phone].  Tracy [phone]

## 2022-09-23 NOTE — Progress Notes (Signed)
Patient ID: Donald Aguilar, male   DOB: 1951/07/07, 71 y.o.   MRN: [id number] Patient is known to IR service.  Patient with cirrhosis and status post TIPS and hepatocellular cancer treatments.  Recently saw patient in clinic to discuss liver directed therapy for his progressive disease.  Consulted today about hematemesis and potential endovascular treatments.  Reviewed today's imaging and there has been progression of his malignancy with tumor thrombus now involving the main portal vein along with the known right portal vein tumor thrombus.  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  Will not pursue additional endovascular treatments for the hepatocellular carcinoma or the upper GI bleeding.  Agree with palliative care consult.

## 2022-09-23 NOTE — Telephone Encounter (Signed)
Phone call from patient stating he is hungry and wants to go home.  He has a history of becoming agitated in similar situations prompting CN to call his nurse on [address].  She stated that he got to the floor after the cafeteria closed but she will try to find something that he is allowed to eat (soft foods only).  [name] RN, Congregational Nurse [phone]

## 2022-09-23 NOTE — Progress Notes (Addendum)
HD#1 Subjective:   Summary: This is a 71 year old male with a past medical history of hep C, alcoholic cirrhosis status post TIPS, HCC status post ablation, concern for new HCC with right hepatic vein invasion who presented to the emergency room with concerns of coffee-ground emesis.  Patient found to have low hemoglobin, and admitted for further evaluation and management of acute blood loss anemia likely secondary to esophageal varices.  Overnight Events: Patient received 2 packed red blood cells overnight  Patient was evaluated at bedside with interpreter present in person.  Patient describes experiencing pain and visualizing dark bloody vomit a few days prior to his arrival. A couple days later, he fell several times due to dizziness. Last bowel movement, which looked black, was yesterday morning. Reports abstinence from alcohol since 1998. Discussed imaging results and diagnosis. Gastroenterology team will continue this conversation in greater detail and then patient can decide next steps.   Objective:  Vital signs in last 24 hours: Vitals:   09/23/22 0534 09/23/22 0559 09/23/22 0600 09/23/22 0625  BP: (!) 95/53 100/62 92/61   Pulse: 65 61 63   Resp: '17 16 15   '$ Temp:    98.5 F (36.9 C)  TempSrc:    Oral  SpO2: 96% 98% 97%   Weight:      Height:       Supplemental O2: Room Air SpO2: 97 %   Physical Exam:  Constitutional: Patient resting in bed in no acute distress HENT: Moist mucous membranes, normocephalic, atraumatic, sublingual jaundice Eyes: Scleral icterus Cardiovascular: regular rate and rhythm, no m/r/g Pulmonary/Chest: normal work of breathing on room air, lungs clear to auscultation bilaterally Abdominal: Soft, with some right upper quadrant tenderness, nondistended, no splenomegaly or hepatomegaly appreciated Neuro: No asterixis noted  Skin: warm and dry  Filed Weights   09/22/22 1444  Weight: 63 kg     Intake/Output Summary (Last 24 hours) at 09/23/2022  0639 Last data filed at 09/23/2022 0511 Gross per 24 hour  Intake 352 ml  Output --  Net 352 ml   Net IO Since Admission: 352 mL [09/23/22 0639]  Pertinent Labs:    Latest Ref Rng & Units 09/22/2022    3:13 PM 09/22/2022    1:46 PM 09/10/2022   10:56 AM  CBC  WBC 4.0 - 10.5 K/uL 15.1  16.3  5.5   Hemoglobin 13.0 - 17.0 g/dL 6.5  6.3  13.9   Hematocrit 39.0 - 52.0 % 20.4  19.4  40.5   Platelets 150 - 400 K/uL 157  164  136        Latest Ref Rng & Units 09/22/2022    3:13 PM 09/22/2022    1:46 PM 09/10/2022   10:56 AM  CMP  Glucose 70 - 99 mg/dL 135  132  89   BUN 8 - 23 mg/dL 53  52  10   Creatinine 0.61 - 1.24 mg/dL 1.26  1.14  0.85   Sodium 135 - 145 mmol/L 134  136  137   Potassium 3.5 - 5.1 mmol/L 4.0  3.6  4.3   Chloride 98 - 111 mmol/L 104  106  105   CO2 22 - 32 mmol/L '21  22  26   '$ Calcium 8.9 - 10.3 mg/dL 7.8  7.8  8.8   Total Protein 6.5 - 8.1 g/dL 5.8  5.7  7.7   Total Bilirubin 0.3 - 1.2 mg/dL 0.7  0.8  1.7   Alkaline Phos 38 - 126  U/L 114  117    AST 15 - 41 U/L 79  81  52   ALT 0 - 44 U/L 60  61  29     Imaging: US ABDOMEN LIMITED WITH LIVER DOPPLER  Result Date: 09/23/2022 CLINICAL DATA:  Cirrhosis with known hepatocellular carcinoma. Recent CT of 09/02/2022 documented tumor thrombus in the right portal venous system and in the main portal vein. EXAM: DUPLEX ULTRASOUND OF LIVER AND TIPS SHUNT TECHNIQUE: Color and duplex Doppler ultrasound was performed to evaluate the hepatic in-flow and out-flow vessels. COMPARISON:  CT scan 09/02/2022 FINDINGS: Portal Vein Velocities Main:  0 cm/sec Right:  0 cm/sec Left:  0 cm/sec TIPS Stent Velocities Proximal:  0 cm/sec Mid:  0 Distal:  0 cm/sec IVC: Present and patent with normal respiratory phasicity. Hepatic Vein Velocities Right:  27.6 cm/sec Mid:  0 cm/sec Left:  63.5 cm/sec Splenic Vein: 26.4 Superior Mesenteric Vein: 21.7 Hepatic Artery: 189 cm / second Ascities: Small volume perihepatic ascites evident. Other  findings: Irregular mass identified in the right hepatic lobe as better characterized on recent CT of 09/02/2022. Ablation defect in the left liver again noted. IMPRESSION: 1. 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 No discernible flow in the right hepatic vein. 2. Small volume perihepatic ascites. 3. Known right hepatic lobe hepatocellular carcinoma better characterized on recent CT of 09/02/2022. Defect in the left liver is compatible with previously characterized ablation defect. Electronically Signed   By: [name] M.D.   On: 09/23/2022 05:45    Assessment/Plan:   Principal Problem:   Acute GI bleeding   Patient Summary: Donald Aguilar is a 71 y.o. male with past medical history of hep C, alcoholic cirrhosis status post TIPS, HCC status post ablation, concern for new HCC with right hepatic vein invasion who presented to the emergency room with concerns of coffee-ground emesis.  Patient found to have low hemoglobin, and admitted for further evaluation and management of acute blood loss anemia likely secondary to esophageal varices.  #Acute blood loss anemia likely secondary to esophageal variceal bleed #HCC with main portal vein invasion #TIPS stent fully occluded  Patient presented with coffee-ground emesis with initial hemoglobin at 6.5.  Patient has been transfused with 2 packed red blood cells during hospitalization.  Post transfusion hemoglobin stable.  Patient CT scan today showing complete occlusion of TIPS stent as well as invasion of HCC into main portal vein.  After talks with GI and IR, they do not believe there are viable treatment options at this time.  Patient will need to see palliative care. -Palliative care following -CBC every 6 hours -Transfuse if hemoglobin drops below 7 -GI following -IR following -Continue Protonix and octreotide -Continue ceftriaxone  #Prerenal acute kidney injury, resolved Creatinine has come back to baseline after receiving  blood. -Continue to monitor CMP  #Hypotension Patient blood pressures have been soft during hospitalization.  Patient's blood pressure remained stable at this time.  Will continue to monitor. -Monitor blood pressure  Of note prognosis is poor given complete occlusion of TIPS stent as well as HCC invasion into the main hepatic vein.  Palliative on board, awaiting recs.  Will likely have meeting tomorrow and come up with final plan regarding patient's goals of care.  Diet: Dysphagia 3 diet  IVF: None VTE: SCDs Code: Full  Dispo: Anticipated discharge to Home in 2 days pending Palliative evaluation.   [facility] Resident PGY-1 [phone] Please contact the on call pager  after 5 pm and on weekends at [phone].

## 2022-09-23 NOTE — ED Notes (Signed)
Sprite provided.

## 2022-09-23 NOTE — Hospital Course (Addendum)
  12/5 Patient describes experiencing pain and visualizing dark bloody vomit a few days prior to his arrival. A couple days later, he fell several times due to dizziness. Last bowel movement, which looked black, was yesterday morning. Reports abstinence from alcohol since 1998. Discussed imaging results and diagnosis. Gastroenterology team will continue this conversation in greater detail and then patient can decide next steps.   12/7 Patient reports feeling good, but has been experiencing some mild dizziness. Enjoys his lemon ice treat. Denies pain and discomfort. Eager to return home and live independently. Discussed plan to discharge later today and provide medications for pain. All of the patient's questions were answered.

## 2022-09-23 NOTE — Consult Note (Addendum)
[facility] Consult: 8:20 AM 09/23/2022  LOS: 1 day    Referring Provider: Dr Saverio Danker  Primary Care Physician:  Linward Natal, MD Primary Gastroenterologist:   Dr Loletha Carrow  All interactions w pt were in presence of translator, good friend and minister to pt Y bin [phone].    Reason for Consultation:  hemetemesis.   Cirrhosis.  Anemia.    HPI: Donald Aguilar is a 71 y.o. male.  [name].  PMH HCV/ETOH cirrhosis.  Completed 24 weeks Epclusa with documented SVR.  [facility]ology has deemed him not a transplant candidate due to insufficient social support.  Complications of cirrhosis include large volume ascites.  Scrotal edema which turned out to be a large inguinal hernia.  TIPS 04/2020.   [place], s/p multiple IR ablation procedures.  Per Dr. Vernard Gambles note 06/05/2022 "patient doing well post repeat ablation of residual left lobe hepatocellular carcinoma. CT looks good with no residual enhancing lesion in the region of treatment. 2 small spots in the right liver are worrisome for small synchronous tumors.  Plan close surveillance for now.  Segment 5 lesion would be easier to approach but every approach results in some collateral liver damage and in the setting of his cirrhosis and limited reserve I want to be relatively cautious and balance risk versus benefit" Thrombocytopenia.   09/13/2019 EGD. Evaluation for varices and new diagnosis of cirrhosis.  3 columns of grade 2 varices without bleeding present.  4 mm in largest diameter.  Small erosions without bleeding seen in the gastric antrum.  Mild portal hypertensive gastropathy throughout the stomach.  Duodenal bulb and second duodenum normal   CTAP on 09/02/2022: Dramatic progression of HCC in the right hepatic lobe with extensive tumor thrombus involving the right hepatic  lobe portal vein from the main PV extending to segments 7 and 8 primarily.  New, 6.7 cm infiltrative mass in the dome of the liver, previously measured 1.8 cm, this is consistent with HCC.  TIPS shunt patent.  No ascites.  No change in the previously ablated left hepatic lobe lesion 09/16/22 met w Dr Vernard Gambles, IR "Patient is a candidate for Y90 radioembolization based on his current liver function.  This is a challenging situation with the patient's social situation and the language barrier.  I discussed Y90 radioembolization procedure with the patient using a translator and we went over a video of the Y90 radioembolization procedure.... I also explained that he may not be a candidate for Y90 based on his mapping study and MAA injection. Patient is reportedly going to see Dr. Burr Medico in oncology about systemic treatment options." No appt w Dr Burr Medico has occurred or set for future by my review.   Since last Monday patient's had anorexia.  On Friday and Saturday he had dark emesis.  On Saturday and Sunday he had tarry black stools.  Chronic right upper quadrant pain has not worsened in recent weeks and has been present for over a year.  Patient remains nauseous today.  Endorses dizziness and falls but no syncope.  Hgb 6.3..7.8 aftrer 2  PRBCs.  was 13.9 2 weeks ago.   Platelets 164..106,  136 on 11/22.   INR 1.4.   T. bili 1.9.  Alkaline phosphatase 117.  AST/ALT 81/61.Marland Kitchen 59/45 09/22/2022 RUQ ultrasound showed the portal vein as well as the TIPS shunt have occluded w no discernible flow in the right hepatic vein.  There is a small amount of perihepatic ascites.  The [facility] is better characterized on last month's CT.  Patient's social situation is limited.  He lives with a fellow elderly [name] roommate.  His closest friend and minister is Mr. [name].  They both came over from [place], [place] at the same time in the early 90s.  Patient has a niece who lives in the [place] but he has never had contact with  her. Has not consumed alcohol since 1980.   Past Medical History:  Diagnosis Date   Arthritis    knees   Ascites 09/09/2019   Cirrhosis ([place]) 08/2019   with ascites, varices   GERD (gastroesophageal reflux disease)    Hepatitis C    Genotype 1a   History of alcohol abuse    Hypotension    Hypotension    Neuromuscular disorder ([place])    peropheral neuropathy    Past Surgical History:  Procedure Laterality Date   BIOPSY  09/13/2019   Procedure: BIOPSY;  Surgeon: Ladene Artist, MD;  Location: [facility] ENDOSCOPY;  Service: Endoscopy;;   ESOPHAGOGASTRODUODENOSCOPY (EGD) WITH PROPOFOL N/A 09/13/2019   Procedure: ESOPHAGOGASTRODUODENOSCOPY (EGD) WITH PROPOFOL;  Surgeon: Ladene Artist, MD;  Location[facility] END[facility]COPY;  Service: Endoscopy;  Laterality: N/A;   INGUINAL HERNIA REPAIR Left 12/28/2020   Procedure: LEFT INGUINAL HERNIA REPAIR WITH MESH;  Surgeon: Coralie Keens, MD;  Location: [facility];  Service: General;  Laterality: Left;  LMA   IR PARACENTESIS  09/12/2019   IR PARACENTESIS  09/29/2019   IR PARACENTESIS  10/19/2019   IR PARACENTESIS  11/02/2019   IR PARACENTESIS  11/10/2019   IR PARACENTESIS  11/22/2019   IR PARACENTESIS  12/06/2019   IR PARACENTESIS  12/16/2019   IR PARACENTESIS  12/23/2019   IR PARACENTESIS  12/30/2019   IR PARACENTESIS  01/06/2020   IR PARACENTESIS  01/16/2020   IR PARACENTESIS  02/01/2020   IR PARACENTESIS  02/08/2020   IR PARACENTESIS  02/28/2020   IR PARACENTESIS  03/23/2020   IR PARACENTESIS  04/17/2020   IR PARACENTESIS  05/02/2020   IR PARACENTESIS  05/11/2020   IR PARACENTESIS  08/23/2020   IR RADIOLOGIST EVAL & MGMT  03/20/2020   IR RADIOLOGIST EVAL & MGMT  06/26/2020   IR RADIOLOGIST EVAL & MGMT  01/09/2021   IR RADIOLOGIST EVAL & MGMT  03/06/2021   IR RADIOLOGIST EVAL & MGMT  07/02/2021   IR RADIOLOGIST EVAL & MGMT  01/10/2022   IR RADIOLOGIST EVAL & MGMT  01/28/2022   IR RADIOLOGIST EVAL & MGMT  03/25/2022   IR RADIOLOGIST EVAL & MGMT  06/05/2022   IR TIPS   05/11/2020   RADIOLOGY WITH ANESTHESIA N/A 05/11/2020   Procedure: IR WITH ANESTHESIA TRASJUGULAR INTRAHEPATIC PORTOSYSTEMIC SHUNT;  Surgeon: Arne Cleveland, MD;  Location: [facility];  Service: Radiology;  Laterality: N/A;   RADIOLOGY WITH ANESTHESIA N/A 02/06/2021   Procedure: CT CRYO ABLATION;  Surgeon: Arne Cleveland, MD;  Location: [facility];  Service: Radiology;  Laterality: N/A;   RADIOLOGY WITH ANESTHESIA N/A 02/26/2022   Procedure: CT MICROWAVE ABLATION;  Surgeon: Arne Cleveland, MD;  Location: [facility];  Service:  Radiology;  Laterality: N/A;    Prior to Admission medications   Not on File    Scheduled Meds:  pantoprazole (PROTONIX) IV  40 mg Intravenous Q12H   Infusions:  cefTRIAXone (ROCEPHIN)  IV Stopped (09/22/22 2233)   octreotide (SANDOSTATIN) 500 mcg in sodium chloride 0.9 % 250 mL (2 mcg/mL) infusion 50 mcg/hr (09/23/22 0513)   PRN Meds:    Allergies as of 09/22/2022   (No Known Allergies)    Family History  Problem Relation Age of Onset   Liver cancer Neg Hx    Colon cancer Neg Hx    Pancreatic cancer Neg Hx    Esophageal cancer Neg Hx     Social History   Socioeconomic History   Marital status: Single    Spouse name: Not on file   Number of children: Not on file   Years of education: Not on file   Highest education level: Not on file  Occupational History   Not on file  Tobacco Use   Smoking status: Every Day    Packs/day: 0.50    Years: 50.00    Total pack years: 25.00    Types: Cigarettes   Smokeless tobacco: Never   Tobacco comments:    10 cigs per day  Vaping Use   Vaping Use: Never used  Substance and Sexual Activity   Alcohol use: Not Currently    Comment: last drank in September, 2020   Drug use: Never   Sexual activity: Not on file  Other Topics Concern   Not on file  Social History Narrative   Leave with 3 friends   Smoke 2 cigarette  a day   Social Determinants of Health   Financial Resource Strain: Not on file  Food Insecurity:  Not on file  Transportation Needs: Not on file  Physical Activity: Not on file  Stress: Not on file  Social Connections: Not on file  Intimate Partner Violence: Not on file    REVIEW OF SYSTEMS: Constitutional: Tired but not profound fatigue ENT:  No nose bleeds Pulm: Denies shortness of breath and cough CV:  No palpitations, no LE edema.  No angina GU:  No hematuria, no frequency.  No oliguria.  No dark-colored urine GI: See HPI Heme: Other than the dark emesis and stools, no other unusual bleeding or bruising. Transfusions:  2 units PRBC overnight.   Neuro:  No headaches, no peripheral tingling or numbness Derm:  No itching, no rash or sores.  Endocrine:  No sweats or chills.  No polyuria or dysuria Immunization: Reviewed Travel:  Not queried.     PHYSICAL EXAM: Vital signs in last 24 hours: Vitals:   09/23/22 0755 09/23/22 0800  BP:  107/64  Pulse:  73  Resp:  18  Temp: 98 F (36.7 C)   SpO2:  98%   Wt Readings from Last 3 Encounters:  09/22/22 63 kg  09/22/22 63.3 kg  06/10/22 65.3 kg    General: Patient looks unwell, cachectic, sarcopenic Head: Muscular wasting of the facial muscles Eyes: No icterus.  Conjunctiva pink. Ears: No obvious hearing deficit Nose: No congestion or discharge Mouth: Mucosa is moist, pink, clear.  Tongue midline.  Some absent teeth.  Remaining teeth in fair repair but quite crooked  Neck: No mass, no JVD Lungs: Clear bilaterally.  No labored breathing or cough. Heart: RRR.  No MRG.  S1, S2 present Abdomen: Soft without distention.  Bowel sounds active.  Do not appreciate deformities, masses in the right  upper quadrant over the area of the liver.  Minimal tenderness in the right upper quadrant..   Rectal: Deferred Musc/Skeltl: Some sarcopenia of the upper and lower limbs Extremities: No CCE Neurologic: Pleasant, calm.  No confusion, fully oriented.  No asterixis.  Moves all 4 limbs without gross deficit, strength was not tested. Skin:  No obvious jaundice.  No rashes, no sores, no telangiectasia Nodes: No cervical adenopathy Psych: Calm, cooperative, fluid speech.  Intake/Output from previous day: 12/04 0701 - 12/05 0700 In: 352 [Blood:352] Out: -  Intake/Output this shift: Total I/O In: -  Out: 300 [Urine:300]  LAB RESULTS: Recent Labs    09/22/22 1346 09/22/22 1513 09/23/22 0707  WBC 16.3* 15.1* 9.1  HGB 6.3* 6.5* 7.8*  HCT 19.4* 20.4* 23.8*  PLT 164 157 106*   BMET Lab Results  Component Value Date   NA 135 09/23/2022   NA 134 (L) 09/22/2022   NA 136 09/22/2022   K 3.6 09/23/2022   K 4.0 09/22/2022   K 3.6 09/22/2022   CL 107 09/23/2022   CL 104 09/22/2022   CL 106 09/22/2022   CO2 20 (L) 09/23/2022   CO2 21 (L) 09/22/2022   CO2 22 09/22/2022   GLUCOSE 117 (H) 09/23/2022   GLUCOSE 135 (H) 09/22/2022   GLUCOSE 132 (H) 09/22/2022   BUN 35 (H) 09/23/2022   BUN 53 (H) 09/22/2022   BUN 52 (H) 09/22/2022   CREATININE 0.95 09/23/2022   CREATININE 1.26 (H) 09/22/2022   CREATININE 1.14 09/22/2022   CALCIUM 7.6 (L) 09/23/2022   CALCIUM 7.8 (L) 09/22/2022   CALCIUM 7.8 (L) 09/22/2022   LFT Recent Labs    09/22/22 1346 09/22/22 1513 09/23/22 0707  PROT 5.7* 5.8* 5.0*  ALBUMIN 2.8* 2.8* 2.4*  AST 81* 79* 59*  ALT 61* 60* 45*  ALKPHOS 117 114 92  BILITOT 0.8 0.7 1.9*   PT/INR Lab Results  Component Value Date   INR 1.4 (H) 09/23/2022   INR 1.4 (H) 09/22/2022   INR 1.4 (H) 09/22/2022   Hepatitis Panel No results for input(s): "HEPBSAG", "HCVAB", "HEPAIGM", "HEPBIGM" in the last 72 hours. C-Diff No components found for: "CDIFF" Lipase     Component Value Date/Time   LIPASE 59 (H) 09/22/2022 1513    Drugs of Abuse  No results found for: "LABOPIA", "COCAINSCRNUR", "LABBENZ", "AMPHETMU", "THCU", "LABBARB"   RADIOLOGY STUDIES: US ABDOMEN LIMITED WITH LIVER DOPPLER  Result Date: 09/23/2022 CLINICAL DATA:  Cirrhosis with known hepatocellular carcinoma. Recent CT of 09/02/2022  documented tumor thrombus in the right portal venous system and in the main portal vein. EXAM: DUPLEX ULTRASOUND OF LIVER AND TIPS SHUNT TECHNIQUE: Color and duplex Doppler ultrasound was performed to evaluate the hepatic in-flow and out-flow vessels. COMPARISON:  CT scan 09/02/2022 FINDINGS: Portal Vein Velocities Main:  0 cm/sec Right:  0 cm/sec Left:  0 cm/sec TIPS Stent Velocities Proximal:  0 cm/sec Mid:  0 Distal:  0 cm/sec IVC: Present and patent with normal respiratory phasicity. Hepatic Vein Velocities Right:  27.6 cm/sec Mid:  0 cm/sec Left:  63.5 cm/sec Splenic Vein: 26.4 Superior Mesenteric Vein: 21.7 Hepatic Artery: 189 cm / second Ascities: Small volume perihepatic ascites evident. Other findings: Irregular mass identified in the right hepatic lobe as better characterized on recent CT of 09/02/2022. Ablation defect in the left liver again noted. IMPRESSION: 1. 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 No discernible flow in the right hepatic vein. 2. Small volume perihepatic ascites. 3. Known  right hepatic lobe hepatocellular carcinoma better characterized on recent CT of 09/02/2022. Defect in the left liver is compatible with previously characterized ablation defect. Electronically Signed   By: [name] M.D.   On: 09/23/2022 05:45      IMPRESSION:      Upper GI bleed.  Likely from esophageal and/or gastric varices and or portal hypertensive gastropathy.  Previous TIPS is now occluded per new Ultrasound.       Cirrhosis from HCV     Hepatocellular carcinoma.  Multiple ablation sessions.  Latest CT about 3 weeks ago ago confirmed new lesions right hepatic lobe and dome of liver.  Lesion in the right lobe associated with extensive tumor thrombus with occlusion of main portal vein.    PLAN:     At present Dr. Silverio Decamp would like patient to meet with palliative care.  She does not feel that endoscopic intervention is going to change the course of what appears to be  terminal hepatocellular cancer.  There was mention of meeting with Dr. Burr Medico at oncology and Dr. Vernard Gambles felt radioembolization might be a possibility after meeting with the patient last week.  At present no immediate plans for upper endoscopy so patient can have clear liquids.  Continue the octreotide drip, Protonix 40 IV bid, Rocephin for SBP prophylaxis   Azucena Freed  09/23/2022, 8:20 AM Phone [phone]   Attending physician's note  I have taken a history, reviewed the chart and examined the patient. I performed a substantive portion of this encounter, including complete performance of at least one of the key components, in conjunction with the APP. I agree with the APP's note, impression and recommendations.   71 year old very pleasant gentleman, [name] with limited English, language interpreter was not available when I saw patient in the ER  HCV and EtOH cirrhosis decompensated with ascites, varices s/p TIPS July 2021 with hepatocellular carcinoma s/p ablation MELD 3.0: 15 at 09/23/2022  7:07 AM MELD-Na: 15 at 09/23/2022  7:07 AM  Presented with 2-week history of melena and right upper quadrant pain Downtrending hemoglobin consistent with ongoing GI blood loss  Right upper quadrant ultrasound yesterday with Doppler showed occlusion of portal vein and TIPS with known hepatocellular carcinoma  GI bleed in the setting of worsening portal hypertension due to portal vein and TIPS occlusion with progression of hepatocellular carcinoma Overall his prognosis is extremely guarded Endoscopic intervention is unlikely to change outcome, is at higher risk for IntraOp complications  Palliative care consult and hospice  Please discuss with IR if they can evaluate possible recannulation of TIPS  Diet as tolerated Octreotide gtt. Pantoprazole IV 40 mg twice daily Ceftriaxone for SBP prophylaxis  The patient was provided an opportunity to ask questions and all were answered. The patient  agreed with the plan and demonstrated an understanding of the instructions.  Damaris Hippo , MD [phone]

## 2022-09-23 NOTE — Progress Notes (Signed)
Internal Medicine Clinic Attending  I saw and evaluated the patient.  I personally confirmed the key portions of the history and exam documented by the resident  and I reviewed pertinent patient test results.  The assessment, diagnosis, and plan were formulated together and I agree with the documentation in the resident's note.  

## 2022-09-23 NOTE — Progress Notes (Signed)
     Referral received for Lorane Gell for goals of care discussion. Chart reviewed and updates received from RN. Patient assessed and is unable to engage appropriately in discussions: speaks [name], previous on-site translator with another patient. Patient taken to CT shortly after I arrived.  Spent substantial time coordinating availability of translator. Previous translator indicated he would call when free, but did not hear back. Translator service dispatcher attempting to locate a translator to be available via phone or in-person but none identified at the time.  After failing to identify translator and patient being in [place], spoke with my colleague Dorthy Cooler, PA who will attempt to complete visit today or tomorrow.  PMT will re-attempt to contact family at a later time/date. Detailed note and recommendations to follow once GOC has been completed.   Thank you for your referral and allowing PMT to assist in [place] care.   Walden Field, NP Palliative Medicine Team Phone: [phone]  NO CHARGE

## 2022-09-23 NOTE — Progress Notes (Addendum)
   Referral received for Lorane Gell :goals of care discussion. Chart reviewed and updates received from Dr. Posey Pronto.   Discussed with my colleague Walden Field NP who will be seeing this patient and will reach out to IMTS for multidisciplinary meeting when available.  Thank you for your referral and allowing PMT to assist in Mr. Nazar Kuan care.   Addendum: Discussed with my colleague Walden Field NP. This PA attempted to reach [facility] interpreter via on call pager [phone]. Awaiting return page. If unable to complete initial consult today, will re-attempt tomorrow.   Dorthy Cooler, [facility] Palliative Medicine Team  Team Phone # [phone]   NO CHARGE

## 2022-09-24 ENCOUNTER — Other Ambulatory Visit: Payer: Medicare Other

## 2022-09-24 DIAGNOSIS — Z7189 Other specified counseling: Secondary | ICD-10-CM

## 2022-09-24 DIAGNOSIS — D62 Acute posthemorrhagic anemia: Secondary | ICD-10-CM | POA: Diagnosis not present

## 2022-09-24 DIAGNOSIS — K920 Hematemesis: Secondary | ICD-10-CM

## 2022-09-24 DIAGNOSIS — D649 Anemia, unspecified: Secondary | ICD-10-CM

## 2022-09-24 DIAGNOSIS — K922 Gastrointestinal hemorrhage, unspecified: Secondary | ICD-10-CM | POA: Diagnosis not present

## 2022-09-24 LAB — BPAM RBC
Blood Product Expiration Date: 202312162359
Blood Product Expiration Date: 202312182359
ISSUE DATE / TIME: 202312042221
ISSUE DATE / TIME: 202312050202
Unit Type and Rh: 6200
Unit Type and Rh: 6200

## 2022-09-24 LAB — CBC
HCT: 22.6 % — ABNORMAL LOW (ref 39.0–52.0)
HCT: 22.6 % — ABNORMAL LOW (ref 39.0–52.0)
Hemoglobin: 7.9 g/dL — ABNORMAL LOW (ref 13.0–17.0)
Hemoglobin: 7.9 g/dL — ABNORMAL LOW (ref 13.0–17.0)
MCH: 32.1 pg (ref 26.0–34.0)
MCH: 32 pg (ref 26.0–34.0)
MCHC: 35 g/dL (ref 30.0–36.0)
MCHC: 35 g/dL (ref 30.0–36.0)
MCV: 91.9 fL (ref 80.0–100.0)
MCV: 91.5 fL (ref 80.0–100.0)
Platelets: 106 K/uL — ABNORMAL LOW (ref 150–400)
Platelets: 106 K/uL — ABNORMAL LOW (ref 150–400)
RBC: 2.46 MIL/uL — ABNORMAL LOW (ref 4.22–5.81)
RBC: 2.47 MIL/uL — ABNORMAL LOW (ref 4.22–5.81)
RDW: 18.4 % — ABNORMAL HIGH (ref 11.5–15.5)
RDW: 18.6 % — ABNORMAL HIGH (ref 11.5–15.5)
WBC: 9.2 K/uL (ref 4.0–10.5)
WBC: 8.2 K/uL (ref 4.0–10.5)
nRBC: 3.9 % — ABNORMAL HIGH (ref 0.0–0.2)
nRBC: 2.7 % — ABNORMAL HIGH (ref 0.0–0.2)

## 2022-09-24 LAB — TYPE AND SCREEN
ABO/RH(D): A POS
Antibody Screen: NEGATIVE
Unit division: 0
Unit division: 0

## 2022-09-24 LAB — COMPREHENSIVE METABOLIC PANEL WITH GFR
ALT: 40 U/L (ref 0–44)
AST: 47 U/L — ABNORMAL HIGH (ref 15–41)
Albumin: 2.4 g/dL — ABNORMAL LOW (ref 3.5–5.0)
Alkaline Phosphatase: 90 U/L (ref 38–126)
Anion gap: 8 (ref 5–15)
BUN: 19 mg/dL (ref 8–23)
CO2: 22 mmol/L (ref 22–32)
Calcium: 7.7 mg/dL — ABNORMAL LOW (ref 8.9–10.3)
Chloride: 106 mmol/L (ref 98–111)
Creatinine, Ser: 0.95 mg/dL (ref 0.61–1.24)
GFR, Estimated: >60 mL/min
Glucose, Bld: 116 mg/dL — ABNORMAL HIGH (ref 70–99)
Potassium: 3.9 mmol/L (ref 3.5–5.1)
Sodium: 136 mmol/L (ref 135–145)
Total Bilirubin: 1.3 mg/dL — ABNORMAL HIGH (ref 0.3–1.2)
Total Protein: 5.1 g/dL — ABNORMAL LOW (ref 6.5–8.1)

## 2022-09-24 MED ORDER — ACETAMINOPHEN 325 MG PO TABS: 650.0000 mg | ORAL_TABLET | Freq: Four times a day (QID) | ORAL | Status: DC | PRN

## 2022-09-24 MED ORDER — ENSURE ENLIVE PO LIQD
237.0000 mL | Freq: Two times a day (BID) | ORAL | Status: DC
  Administered 2022-09-24: 237 mL via ORAL

## 2022-09-24 MED ORDER — LORAZEPAM 2 MG/ML PO CONC: 1.0000 mg | ORAL | Status: DC | PRN

## 2022-09-24 MED ORDER — ACETAMINOPHEN 650 MG RE SUPP: 650.0000 mg | Freq: Four times a day (QID) | RECTAL | Status: DC | PRN

## 2022-09-24 MED ORDER — MORPHINE SULFATE (CONCENTRATE) 10 MG/0.5ML PO SOLN
5.0000 mg | ORAL | Status: DC
  Administered 2022-09-24 – 2022-09-25 (×4): 5 mg via ORAL
  Filled 2022-09-24 (×4): qty 0.5

## 2022-09-24 MED ORDER — BIOTENE DRY MOUTH MT LIQD: 15.0000 mL | OROMUCOSAL | Status: DC | PRN

## 2022-09-24 MED ORDER — POLYVINYL ALCOHOL 1.4 % OP SOLN: 1.0000 [drp] | Freq: Four times a day (QID) | OPHTHALMIC | Status: DC | PRN

## 2022-09-24 MED ORDER — GLYCOPYRROLATE 1 MG PO TABS: 1.0000 mg | ORAL_TABLET | ORAL | Status: DC | PRN

## 2022-09-24 MED ORDER — LORAZEPAM 1 MG PO TABS: 1.0000 mg | ORAL_TABLET | ORAL | Status: DC | PRN

## 2022-09-24 MED ORDER — ONDANSETRON HCL 4 MG/2ML IJ SOLN: 4.0000 mg | Freq: Four times a day (QID) | INTRAMUSCULAR | Status: DC | PRN

## 2022-09-24 MED ORDER — MORPHINE SULFATE (CONCENTRATE) 10 MG/0.5ML PO SOLN
5.0000 mg | ORAL | Status: DC
  Filled 2022-09-24: qty 0.5

## 2022-09-24 MED ORDER — LORAZEPAM 2 MG/ML IJ SOLN: 1.0000 mg | INTRAMUSCULAR | Status: DC | PRN

## 2022-09-24 MED ORDER — MORPHINE SULFATE (PF) 2 MG/ML IV SOLN: 1.0000 mg | INTRAVENOUS | Status: DC | PRN

## 2022-09-24 MED ORDER — GLYCOPYRROLATE 0.2 MG/ML IJ SOLN: 0.2000 mg | INTRAMUSCULAR | Status: DC | PRN

## 2022-09-24 MED ORDER — DIPHENHYDRAMINE HCL 50 MG/ML IJ SOLN: 12.5000 mg | INTRAMUSCULAR | Status: DC | PRN

## 2022-09-24 MED ORDER — ADULT MULTIVITAMIN W/MINERALS CH
1.0000 | ORAL_TABLET | Freq: Every day | ORAL | Status: DC
  Administered 2022-09-24: 1 via ORAL
  Filled 2022-09-24: qty 1

## 2022-09-24 MED ORDER — ONDANSETRON 4 MG PO TBDP: 4.0000 mg | ORAL_TABLET | Freq: Four times a day (QID) | ORAL | Status: DC | PRN

## 2022-09-24 MED ORDER — BISACODYL 10 MG RE SUPP: 10.0000 mg | Freq: Every day | RECTAL | Status: DC | PRN

## 2022-09-24 NOTE — Progress Notes (Signed)
PT Cancellation Note  Patient Details Name: Donald Aguilar MRN: [id number] DOB: 01/05/1951   Cancelled Treatment:    Reason Eval/Treat Not Completed: (P) Other (comment) Palliative Meeting going on in room. PT will follow back later for Evaluation.   Jesly Hartmann B. Migdalia Dk PT, DPT Acute Rehabilitation Services Please use secure chat or  Call Office [phone]    [place] 09/24/2022, 9:37 AM

## 2022-09-24 NOTE — Plan of Care (Signed)
  Problem: Pain Managment: Goal: General experience of comfort will improve Outcome: Progressing   Problem: Safety: Goal: Ability to remain free from injury will improve Outcome: Progressing   Problem: Skin Integrity: Goal: Risk for impaired skin integrity will decrease Outcome: Progressing   

## 2022-09-24 NOTE — Congregational Nurse Program (Signed)
Hospital visit with interpreter [facility].  Patient resting comfortably watching TV.  States he is feeling stronger but still having right upper quadrant abdominal pain.  Reports he started on a new medicine which is helping the pain and also eating with no vomiting today.  States he is craving sour and citrus foods. His floor nurse brought cranberry juice which he liked and CN went to cafeteria and got oranges and lemonade.  Asking when he can go home.  [name] RN, Congregational Nurse [phone]

## 2022-09-24 NOTE — Consult Note (Signed)
Consultation Note Date: 09/24/2022   Patient Name: Donald Aguilar  DOB: [birth year]  MRN: [id number]  Age / Sex: 71 y.o., male  PCP: Linward Natal, MD Referring Physician: Charise Killian, MD  Reason for Consultation: Establishing goals of care  HPI/Patient Profile: 71 y.o. male with past medical history of decompensated HCV/alcohol-related cirrhosis complicated by [facility] s/p ablation, ascites, and non-bleeding varices s/p TIPS in 2021, grade 2 esophageal varices, admitted on 09/22/2022 from clinic visit with coffee-ground emesis, melena, dizziness/falls, and tachycardic and hypotensive in clinic with hemoglobin of 6.3.   He was transfused 2U pRBC and started on octreotide, IV PPI, and CTX. No further episodes of hematemesis or melena since admission per patient report. GI has evaluated and does not feel an endoscopic procedure is warranted or would be beneficial at this time given his extensive malignancy. There has been progression of his malignancy seen on imaging, with tumor thrombus now involving the main portal vein along with the known right portal vein tumor thrombus.  IR has also evaluated and will not pursue further endovascular treatments given extent of disease.   PMT has been consulted to assist with goals of care conversation.  Clinical Assessment and Goals of Care:  I have reviewed medical records including EPIC notes, labs and imaging, received updates from primary medical team, assessed the patient and then met at the bedside with [place] translator to discuss diagnosis prognosis, GOC, EOL wishes, disposition and options.  I introduced Palliative Medicine as specialized medical care for people living with serious illness. It focuses on providing relief from the symptoms and stress of a serious illness. The goal is to improve quality of life for both the patient and the family.  We discussed a brief life  review of the patient and then focused on their current illness.   I attempted to elicit values and goals of care important to the patient.    Medical History Review and Understanding:  We discussed patient's illness and patient shares that this is the first time he has ever heard of his cancer diagnosis.  Reviewed terminal nature of liver cancer with unfortunately no long-term treatment options remaining at this time.  Confirmed his understanding after few attempts.  Social History: Patient lives with 4 roommates.  He cooks himself very small meals.  He has no family that he is in touch with and he emigrated from [place] in the 90s.  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  He feels that not many of his family members assist him because he has no money.  Functional and Nutritional State: Patient reports eating very small meals.  He reports usually walking around his community but has had functional decline with falls and dizziness in the past few days.  Palliative Symptoms: Right upper quadrant pain  Advance Directives: A detailed discussion regarding advanced directives was had.  Patient wishes to name [name], his translator closest friend and minister, as Economist.  Code Status: Concepts specific to code status, artifical feeding and hydration, and rehospitalization were considered and discussed. Recommended consideration of DNR status, understanding evidenced-based poor outcomes in similar hospitalized patients, as the cause of the arrest is likely associated with chronic/terminal disease rather than a reversible acute cardio-pulmonary event.   Discussion: Met with patient, translator, and internal medicine teaching service physicians to review patient's course of illness and poor prognosis.  After discussion of his cancer and review of the option to transition to comfort focused care, patient feels that he would like to focus on his pain management  and transition to comfort care.  In regards to [place],  he feels "when it is time, it is time" and would prefer a peaceful and natural death over cardiopulmonary resuscitation.  We discussed the option to start a referral for hospice evaluation and reviewed some of the resources and services offered.  Patient's main goal is to return home and enjoy his time there as long as he can.  Counseling on the process of adjusting pain medications and encouraged patient to use as needed doses to assist in further calculation of dosing needs.  The difference between aggressive medical intervention and comfort care was considered in light of the patient's goals of care. Hospice services outpatient were explained and offered.   Discussed the importance of continued conversation with family and the medical providers regarding overall plan of care and treatment options, ensuring decisions are within the context of the patient's values and GOCs.   Questions and concerns were addressed. The patient was encouraged to call with questions or concerns.  PMT will continue to support holistically.  SUMMARY OF RECOMMENDATIONS   -CODE STATUS updated to DNR -Transition to comfort care today Morphine scheduled every 4 hours and as needed PRN for pain/air hunger/comfort Robinul PRN for excessive secretions Ativan PRN for agitation/anxiety Zofran PRN for nausea Liquifilm tears PRN for dry eyes May have comfort feeding 8 Unrestricted visitations in the setting of EOL (per policy) Oxygen PRN 2L or less for comfort. No escalation.   -Spiritual care consulted for completion of advanced directives -TOC consulted for assistance with referral to hospice, patient prefers hospice at home -Psychosocial and emotional support provided -PMT will continue to follow and support  Prognosis:  Very poor prognosis with progression of hepatocellular carcinoma, functional decline, lack of treatment options  Discharge Planning: To Be Determined      Primary Diagnoses: Present on  Admission:  Acute GI bleeding  Physical Exam Vitals and nursing note reviewed.  Constitutional:      General: He is not in acute distress. Cardiovascular:     Rate and Rhythm: Normal rate.  Pulmonary:     Effort: Pulmonary effort is normal.  Skin:    General: Skin is warm and dry.  Neurological:     Mental Status: He is alert and oriented to person, place, and time.  Psychiatric:        Mood and Affect: Mood normal.        Behavior: Behavior normal.    Vital Signs: BP 97/64 (BP Location: Right Arm)   Pulse 70   Temp 97.6 F (36.4 C) (Oral)   Resp 18   Ht _0  (1.676 m)   Wt 63 kg   SpO2 95%   BMI 22.44 kg/m  Pain Scale: 0-10   Pain Score: 4    SpO2: SpO2: 95 % O2 Device:SpO2: 95 % O2 Flow Rate: .   Palliative Assessment/Data:    MDM: high   Raylon Lamson Johnnette Litter, [name]  Palliative Medicine Team Team phone # [phone]  Thank you for allowing the Palliative Medicine Team to assist in the care of this patient. Please utilize secure chat with additional questions, if there is no response within 30 minutes please call the above phone number.  Palliative Medicine Team providers are available by phone from 7am to 7pm daily and can be reached through the team cell phone.  Should this patient require assistance outside of these hours, please call the patient's attending physician.

## 2022-09-24 NOTE — Progress Notes (Signed)
Initial Nutrition Assessment  DOCUMENTATION CODES:   Not applicable  INTERVENTION:  Encourage adequate PO intake Ensure Enlive po BID, each supplement provides 350 kcal and 20 grams of protein. Magic cup TID with meals, each supplement provides 290 kcal and 9 grams of protein MVI with minerals daily  NUTRITION DIAGNOSIS:   Increased nutrient needs related to cancer and cancer related treatments as evidenced by estimated needs.  GOAL:   Patient will meet greater than or equal to 90% of their needs  MONITOR:   PO intake, Supplement acceptance, Labs, Weight trends  REASON FOR ASSESSMENT:   Consult Assessment of nutrition requirement/status  ASSESSMENT:   Pt admitted with acute GIB. PMH significant for alcohol cirrhosis s/p TIPS, HCV, HCC s/p ablation, c/f new HCC with R hepatic vein invasion.  Per H&P pt with 2 weeks of melena and coffee ground emesis. No reports of abdominal distension, weight gain or loss. Per GI, pt with anorexia since last Monday.   PMT following attempting to elicit GOC as pt is noted to have progression of his malignancy with tumor thrombus involving the main portal vein along with R portal vein tumor thrombus. GIB suspected to be coming from esophageal varices. Per IR no plans for additional endovascular treatments for [facility] or GIB.   RD working remotely. Pt requiring [place] interpreter.   Will place order for nutrition supplements to assist in meeting increased nutritional needs r/t [facility] and monitor for GOC and additional nutritional needs throughout admission.   No documented meal completions on file.   Reviewed weight history. It appears that his weight has been declining since 1/19. He has had a 7.6% weight loss which is not clinically significant for time frame.   Medications: protonix, IV abx   Labs: AST 47, tbili 1/3, hgb 7.9  NUTRITION - FOCUSED PHYSICAL EXAM: RD working remotely. Deferred to follow up.   Diet Order:   Diet Order              DIET DYS 3 Room service appropriate? Yes; Fluid consistency: Thin  Diet effective now                   EDUCATION NEEDS:   No education needs have been identified at this time  Skin:  Skin Assessment: Reviewed RN Assessment  Last BM:  12/5  Height:   Ht Readings from Last 1 Encounters:  09/22/22 '5\' 6"'$  (1.676 m)    Weight:   Wt Readings from Last 1 Encounters:  09/24/22 63 kg   BMI:  Body mass index is 22.44 kg/m.  Estimated Nutritional Needs:   Kcal:  2000-2200  Protein:  100-115g  Fluid:  >/=2L  Clayborne Dana, RDN, LDN Clinical Nutrition

## 2022-09-24 NOTE — Progress Notes (Addendum)
HD#2 Subjective:   Summary: Donald Aguilar is a 71 y.o. male with past medical history of hep C, alcoholic cirrhosis status post TIPS, HCC status post ablation, concern for new HCC with right hepatic vein invasion who presented to the emergency room with concerns of coffee-ground emesis.  Patient found to have low hemoglobin, and admitted for further evaluation and management of acute blood loss anemia likely secondary to esophageal varices.  Patient found to have extensive hepatocellular carcinoma with invasion into main portal vein with TIPS stent fully occluded, with no viable treatment options.  Patient opted to comfort care.  Overnight Events: No overnight events.   Patient evaluated at bedside with interpreter.  Palliative care at bedside as well.  There is long conversation with patient regarding his status, and that unfortunately his cancer has progressed and will not be cured. Patient states understanding about what is going on, reports that he would rather focus on comfort at end of life.  He would like to go home, but there is concern that he may not be able to take care of himself, given that he is weak.  Patient does stay at home with 3 other roommates.  Objective:  Vital signs in last 24 hours: Vitals:   09/23/22 1800 09/23/22 1830 09/23/22 1939 09/24/22 0004  BP: 108/68 122/87 107/66 (!) 98/59  Pulse: 62 91 65 78  Resp: 17 (!) 22 (!) 21 18  Temp:   (!) 97.5 F (36.4 C) 97.8 F (36.6 C)  TempSrc:   Oral Oral  SpO2: 96% 98% 99% 97%  Weight:      Height:       Supplemental O2: Room Air SpO2: 97 %  Physical Exam:  Constitutional: Patient resting in bed in no acute distress Eyes: Scleral icterus, conjunctive pallor.  Cardiovascular: regular rate and rhythm, no m/r/g Pulmonary/Chest: normal work of breathing on room air, lungs clear to auscultation bilaterally Abdominal: Soft, with some right upper quadrant tenderness, nondistended, no splenomegaly or hepatomegaly  appreciated Skin: warm and dry  Filed Weights   09/22/22 1444  Weight: 63 kg     Intake/Output Summary (Last 24 hours) at 09/24/2022 0616 Last data filed at 09/23/2022 0756 Gross per 24 hour  Intake --  Output 300 ml  Net -300 ml    Net IO Since Admission: 52 mL [09/24/22 0616]  Pertinent Labs:    Latest Ref Rng & Units 09/24/2022    1:07 AM 09/23/2022    7:45 PM 09/23/2022    2:28 PM  CBC  WBC 4.0 - 10.5 K/uL 9.2  9.1  8.7   Hemoglobin 13.0 - 17.0 g/dL 7.9  8.3  7.9   Hematocrit 39.0 - 52.0 % 22.6  24.6  22.8   Platelets 150 - 400 K/uL 106  114  125        Latest Ref Rng & Units 09/24/2022    1:07 AM 09/23/2022    7:07 AM 09/22/2022    3:13 PM  CMP  Glucose 70 - 99 mg/dL 116  117  135   BUN 8 - 23 mg/dL 19  35  53   Creatinine 0.61 - 1.24 mg/dL 0.95  0.95  1.26   Sodium 135 - 145 mmol/L 136  135  134   Potassium 3.5 - 5.1 mmol/L 3.9  3.6  4.0   Chloride 98 - 111 mmol/L 106  107  104   CO2 22 - 32 mmol/L '22  20  21   '$ Calcium  8.9 - 10.3 mg/dL 7.7  7.6  7.8   Total Protein 6.5 - 8.1 g/dL 5.1  5.0  5.8   Total Bilirubin 0.3 - 1.2 mg/dL 1.3  1.9  0.7   Alkaline Phos 38 - 126 U/L 90  92  114   AST 15 - 41 U/L 47  59  79   ALT 0 - 44 U/L 40  45  60     Imaging: CT ANGIO GI BLEED  Result Date: 09/23/2022 CLINICAL DATA:  71 year old with history of hepatocellular carcinoma and status post TIPS procedure. Patient presents with hematemesis. EXAM: CTA ABDOMEN AND PELVIS WITHOUT AND WITH CONTRAST TECHNIQUE: Multidetector CT imaging of the abdomen and pelvis was performed using the standard protocol during bolus administration of intravenous contrast. Multiplanar reconstructed images and MIPs were obtained and reviewed to evaluate the vascular anatomy. RADIATION DOSE REDUCTION: This exam was performed according to the departmental dose-optimization program which includes automated exposure control, adjustment of the mA and/or kV according to patient size and/or use of iterative  reconstruction technique. CONTRAST:  14m OMNIPAQUE IOHEXOL 350 MG/ML SOLN COMPARISON:  09/02/2022 FINDINGS: VASCULAR Aorta: Atherosclerotic disease in the abdominal aorta without aneurysm, dissection or significant stenosis. Celiac: Common trunk for the celiac artery and SMA. This common trunk is widely patent. Splenic artery and common hepatic artery patent. GDA is patent without gross abnormality. Hepatic arteries are patent. SMA: SMA is patent. Renals: Both renal arteries are patent without evidence of aneurysm, dissection, vasculitis, fibromuscular dysplasia or significant stenosis. IMA: Patent without evidence of aneurysm, dissection, vasculitis or significant stenosis. Inflow: Atherosclerotic disease in the bilateral iliac arteries. Common, internal and external iliac arteries are patent bilaterally. No significant stenosis. Negative for dissection or aneurysm. Right common iliac artery is mildly ectatic measuring up to 1.3 cm. Proximal Outflow: Proximal femoral arteries are patent bilaterally. Veins: TIPS stent is now occluded. 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 There is evidence for enhancing clot in the main portal vein on image 20/22. There is a small amount of flow in the main portal vein and there continues to be flow in the left portal veins. There appears to be bland thrombus at the portal confluence near the junction of the splenic vein and SMV. Iliac veins and IVC are patent. Bilateral renal veins are patent. Prominent esophageal varices. Review of the MIP images confirms the above findings. NON-VASCULAR Lower chest: Trace right pleural fluid. Hepatobiliary: Again noted is heterogeneous tumor in the liver involving segments 7 and 8. Again noted is tumor thrombus involving the right portal veins. Tumor thrombus now extends into the main portal vein.  Heterogeneous tumor in the hepatic dome measures 6.8 x 6.5 cm on sequence 6, image 22. Liver has a nodular contour and compatible with underlying cirrhosis. Ablation defects involving the posterior left hepatic lobe. Again noted is left intrahepatic biliary dilatation. Cholelithiasis without significant gallbladder distension. Pancreas: Unremarkable. No pancreatic ductal dilatation or surrounding inflammatory changes. Spleen: Normal in size without focal abnormality. Adrenals/Urinary Tract: Adrenal glands are within normal limits. No hydronephrosis. No suspicious renal lesions. 4 mm nonobstructive stone in left kidney lower pole. Additional small left renal stones. Probable small cysts in left kidney that do not require dedicated follow-up. Urinary bladder is mildly thickened but otherwise unremarkable. Stomach/Bowel: Diffuse thickening in the distal esophagus and findings compatible with prominent esophageal varices.  Duodenum and jejunum demonstrate diffuse wall thickening and likely secondary to venous congestion from the portal vein thrombus. Diffuse wall thickening in the rectum. There is a bowel lipoma on image 166/17 measuring up to 1.4 cm. Not clear if this lipoma is in small or large bowel due to the edematous nature of the bowel. Wall thickening and edema involving the ascending dand escending colon. No evidence for pneumatosis. No evidence for bowel obstruction. Lymphatic: No significant lymph node enlargement in the abdomen or pelvis. Reproductive: Prostate is unremarkable. Other: New abdominal ascites. Small amount of ascites in the lower abdomen and small amount of perihepatic ascites. No evidence for free air. Bilateral inguinal hernias containing fat and there is ascites extending into the inguinal canals, right side greater than left. Musculoskeletal: No acute bone abnormality. IMPRESSION: VASCULAR 1. 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 Majority of the portal vein thrombus  appears to represent tumor thrombus. There appears to be bland thrombus near the portal vein confluence. Again noted is tumor thrombus extending throughout the right portal veins. 2. Prominent esophageal varices. 3. TIPS stent is occluded. 4. Splenic vein and mesenteric veins remain patent but there is thrombus at the portal vein confluence. NON-VASCULAR 1. Progression of the hepatocellular carcinoma with tumor thrombus extending into the main portal vein. 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 Again noted is tumor thrombus in the right portal veins with heterogeneous tumor in the right hepatic lobe particularly in segments 7 and 8. 2. Diffuse wall thickening throughout the small and large bowel. Based on the findings in the portal venous system, these findings likely represent bowel venous congestion. 3. New ascites. 4. Nonobstructive left renal calculi. Electronically Signed   By: [name] M.D.   On: 09/23/2022 16:17    Assessment/Plan:   Principal Problem:   Acute GI bleeding   Patient Summary: Donald Aguilar is a 71 y.o. male with past medical history of hep C, alcoholic cirrhosis status post TIPS, HCC status post ablation, concern for new HCC with right hepatic vein invasion who presented to the emergency room with concerns of coffee-ground emesis.  Patient found to have low hemoglobin, and admitted for further evaluation and management of acute blood loss anemia likely secondary to esophageal varices.  Patient found to have extensive hepatocellular carcinoma with invasion into main portal vein with TIPS stent fully occluded, with no viable treatment options.  Patient has chosen to pursue palliative/hospice care.  #Acute blood loss anemia likely secondary to esophageal variceal bleed #Progressive HCC with main portal vein invasion #TIPS stent fully occluded  Patient has hepatocellular carcinoma with near-occlusive main portal vein invasion and right portal vein occlusion with  TIPS stent fully occluded.  After long talks with palliative care, GI, and IR, further treatment will not be pursued, and likely this will lead to his demise.  Patient is understanding about this, and would like to pursue palliative/hospice care  Comfort care measures have been initiated.  Blood draws have been discontinued, antibiotics have been discontinued, fingersticks have been discontinued, and comfort measures optimized. -Comfort care measures -Palliative care following -Will need to tease out if he is a good candidate for home hospice versus residential hospice given his weakness.  #Prerenal acute kidney injury, resolved Prerenal acute kidney injury has resolved. -No need to monitor labs anymore given patient is comfort care  Of note prognosis is poor, transitioned to comfort care today.  Diet: Dysphagia 3 diet  IVF: None VTE:  SCDs Code: DNR  Dispo: Anticipated discharge to home hospice or residential hospice    [place] Internal Medicine Resident PGY-1 [phone] Please contact the on call pager after 5 pm and on weekends at [phone].

## 2022-09-24 NOTE — Progress Notes (Signed)
OT Cancellation Note  Patient Details Name: Donald Aguilar MRN: [id number] DOB: 27-Sep-1951   Cancelled Treatment:    Reason Eval/Treat Not Completed: Other (comment) (medical team with pt for palliative meeting. Will follow up for OT eval as schedule permits)  Renaye Rakers, OTD, OTR/L [facility] [phone]   Ulla Gallo 09/24/2022, 9:37 AM

## 2022-09-24 NOTE — Plan of Care (Signed)
Pt transitioned to comfort care; DNR wrist band placed on pt. Q4 morphine scheduled for abdominal pain.  Problem: Education: Goal: Knowledge of General Education information will improve Description: Including pain rating scale, medication(s)/side effects and non-pharmacologic comfort measures Outcome: Progressing   Problem: Health Behavior/Discharge Planning: Goal: Ability to manage health-related needs will improve Outcome: Progressing   Problem: Clinical Measurements: Goal: Ability to maintain clinical measurements within normal limits will improve Outcome: Progressing Goal: Will remain free from infection Outcome: Progressing Goal: Diagnostic test results will improve Outcome: Progressing Goal: Respiratory complications will improve Outcome: Progressing Goal: Cardiovascular complication will be avoided Outcome: Progressing   Problem: Activity: Goal: Risk for activity intolerance will decrease Outcome: Progressing   Problem: Nutrition: Goal: Adequate nutrition will be maintained Outcome: Progressing   Problem: Coping: Goal: Level of anxiety will decrease Outcome: Progressing   Problem: Elimination: Goal: Will not experience complications related to bowel motility Outcome: Progressing Goal: Will not experience complications related to urinary retention Outcome: Progressing   Problem: Pain Managment: Goal: General experience of comfort will improve Outcome: Progressing   Problem: Safety: Goal: Ability to remain free from injury will improve Outcome: Progressing   Problem: Skin Integrity: Goal: Risk for impaired skin integrity will decrease Outcome: Progressing   Problem: Education: Goal: Knowledge of the prescribed therapeutic regimen will improve Outcome: Progressing   Problem: Coping: Goal: Ability to identify and develop effective coping behavior will improve Outcome: Progressing   Problem: Clinical Measurements: Goal: Quality of life will  improve Outcome: Progressing   Problem: Respiratory: Goal: Verbalizations of increased ease of respirations will increase Outcome: Progressing   Problem: Role Relationship: Goal: Family's ability to cope with current situation will improve Outcome: Progressing Goal: Ability to verbalize concerns, feelings, and thoughts to partner or family member will improve Outcome: Progressing   Problem: Pain Management: Goal: Satisfaction with pain management regimen will improve Outcome: Progressing

## 2022-09-25 DIAGNOSIS — Z789 Other specified health status: Secondary | ICD-10-CM

## 2022-09-25 DIAGNOSIS — Z66 Do not resuscitate: Secondary | ICD-10-CM

## 2022-09-25 DIAGNOSIS — K920 Hematemesis: Secondary | ICD-10-CM | POA: Diagnosis not present

## 2022-09-25 DIAGNOSIS — C220 Liver cell carcinoma: Secondary | ICD-10-CM | POA: Diagnosis not present

## 2022-09-25 DIAGNOSIS — K922 Gastrointestinal hemorrhage, unspecified: Secondary | ICD-10-CM | POA: Diagnosis not present

## 2022-09-25 DIAGNOSIS — D62 Acute posthemorrhagic anemia: Secondary | ICD-10-CM | POA: Diagnosis not present

## 2022-09-25 DIAGNOSIS — Z515 Encounter for palliative care: Secondary | ICD-10-CM | POA: Diagnosis not present

## 2022-09-25 DIAGNOSIS — D649 Anemia, unspecified: Secondary | ICD-10-CM | POA: Diagnosis not present

## 2022-09-25 DIAGNOSIS — R52 Pain, unspecified: Secondary | ICD-10-CM

## 2022-09-25 MED ORDER — PANTOPRAZOLE SODIUM 40 MG PO TBEC
40.0000 mg | DELAYED_RELEASE_TABLET | Freq: Two times a day (BID) | ORAL | Status: DC
  Administered 2022-09-25 – 2022-09-26 (×2): 40 mg via ORAL
  Filled 2022-09-25 (×2): qty 1

## 2022-09-25 MED ORDER — MORPHINE SULFATE ER 15 MG PO TBCR
15.0000 mg | EXTENDED_RELEASE_TABLET | Freq: Two times a day (BID) | ORAL | Status: DC
  Administered 2022-09-25 – 2022-09-26 (×3): 15 mg via ORAL
  Filled 2022-09-25 (×3): qty 1

## 2022-09-25 MED ORDER — SENNOSIDES-DOCUSATE SODIUM 8.6-50 MG PO TABS
1.0000 | ORAL_TABLET | Freq: Every day | ORAL | Status: DC
  Administered 2022-09-25: 1 via ORAL
  Filled 2022-09-25: qty 1

## 2022-09-25 MED ORDER — OXYCODONE HCL 5 MG PO TABS
5.0000 mg | ORAL_TABLET | ORAL | Status: DC | PRN
  Administered 2022-09-25: 5 mg via ORAL
  Filled 2022-09-25: qty 1

## 2022-09-25 NOTE — Progress Notes (Signed)
Daily Progress Note   Patient Name: Donald Aguilar       Date: 09/25/2022 DOB: Nov 11, 1950  Age: 71 y.o. MRN#: [id number] Attending Physician: Charise Killian, MD Primary Care Physician: Linward Natal, MD Admit Date: 09/22/2022  Reason for Consultation/Follow-up: Non pain symptom management, Pain control, Psychosocial/spiritual support, and Terminal Care  Subjective: Chart review performed. Discussed case with chaplain, who is arranging HCPOA completion for tomorrow. Received report from primary RN - no acute concerns. Per unit secretary in person interpreter had just left. Attempted to call without success.   Attempted to obtain interpreter via [employer] x15 minutes without success.   Went to see patient at bedside - no family/visitors present. Patient is lying in bed awake and alert. No signs or non-verbal gestures of pain or discomfort noted. No respiratory distress, increased work of breathing, or secretions noted. He is able to speak minimal English - reports "a little pain." He asks me to call a contact in his phone, Hoyle Sauer.  Called Hoyle Sauer [phone] - she tells me she is a Advertising account planner and does not speak [name] but is with the [employer] who does. Doristine Bosworth is available and able to assist with translating - he is also the person patient wants to make HCPOA. His personal phone number is [phone] and states it is ok to call him with any needs. Reviewed plan for HCPOA completion for tomorrow.   Via [employer] interpreting, patient reports his pain is well managed overall. I discussed with him stopping scheduled q4h administration and initiating a longer acting medication given q12h for easier administration/compliance once at home - he is agreeable and feels as long as he  has instructions at discharge, he will be able to manage himself. Patient asks when he can go home - reviewed HCPOA documents will be completed tomorrow, and if he desires, can discharge home with hospice after, he is ok with this.   All questions and concerns addressed. Encouraged to call with questions and/or concerns. PMT card provided.  Discussed case in detail with Dr. Posey Pronto.    Length of Stay: 3  Current Medications: Scheduled Meds:   morphine CONCENTRATE  5 mg Oral Q4H   Or   morphine CONCENTRATE  5 mg Sublingual Q4H   pantoprazole  40 mg Oral BID    Continuous Infusions:  PRN Meds: acetaminophen **OR** acetaminophen, antiseptic oral rinse, bisacodyl, diphenhydrAMINE, glycopyrrolate **OR** glycopyrrolate **OR** glycopyrrolate, LORazepam **OR** [DISCONTINUED] LORazepam **OR** LORazepam, morphine injection, ondansetron **OR** ondansetron (ZOFRAN) IV, polyvinyl alcohol  Physical Exam Vitals and nursing note reviewed.  Constitutional:      General: He is not in acute distress. Pulmonary:     Effort: No respiratory distress.  Skin:    General: Skin is warm and dry.  Neurological:     Mental Status: He is alert and oriented to person, place, and time.  Psychiatric:        Attention and Perception: Attention normal.        Behavior: Behavior is cooperative.        Cognition and Memory: Cognition and memory normal.             Vital Signs: BP 123/80 (BP Location: Right Arm)   Pulse 73   Temp (!) 97.4 F (36.3 C) (Axillary)   Resp 20   Ht '5\' 6"'$  (1.676 m)   Wt 63 kg   SpO2 97%   BMI 22.42 kg/m  SpO2: SpO2: 97 % O2 Device: O2 Device: Room Air O2 Flow Rate:    Intake/output summary:  Intake/Output Summary (Last 24 hours) at 09/25/2022 1110 Last data filed at 09/25/2022 0435 Gross per 24 hour  Intake 696.15 ml  Output --  Net 696.15 ml   LBM: Last BM Date : 09/24/22 Baseline Weight: Weight: 63 kg Most recent weight: Weight: 63 kg       Palliative  Assessment/Data: PPS 60%      Patient Active Problem List   Diagnosis Date Noted   Esophageal varices ([place]) 09/22/2022   Acute GI bleeding 09/22/2022   Acute pain of right wrist 07/10/2022   Tooth pain 11/07/2021   Constipation 06/18/2021   Hypokalemia 06/18/2021   Knee pain 06/18/2021   Dizziness 04/25/2021   Vision changes 04/25/2021   Abdominal pain 04/02/2021   Hepatocellular carcinoma ([place]) 02/06/2021   Healthcare maintenance 10/03/2020   Chronic hepatitis C without hepatic coma ([place]) 09/28/2019   Erosive gastritis    Cirrhosis of liver ([place]) 09/09/2019    Palliative Care Assessment & Plan   Patient Profile: 71 y.o. 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    He was transfused 2U pRBC and started on octreotide, IV PPI, and CTX. No further episodes of hematemesis or melena since admission per patient report. GI has evaluated and does not feel an endoscopic procedure is warranted or would be beneficial at this time given his extensive malignancy. There has been progression of his malignancy seen on imaging, with tumor thrombus now involving the main portal vein along with the known right portal vein tumor thrombus.  IR has also evaluated and will not pursue further endovascular treatments given extent of disease.   PMT has been consulted to assist with goals of care conversation.  Assessment: Principal Problem:   Acute GI bleeding   Terminal care  Recommendations/Plan: Continue full comfort measures Continue DNR/DNI as previously documented - durable DNR form completed and placed in shadow chart. Copy was made and will be scanned into Vynca/ACP tab HCPOA documents to be  completed tomorrow 12/8 at 10am with chaplain. Patient wants to discharge home after documents completed Discharge home  with hospice with [facility] Continue current comfort focused medication regimen - adjustments as noted below PMT will continue to follow and support holistically  Symptom Management Discontinued scheduled q4h morphine; started MS Contin BID  Oxycodone PRN for breakthrough symptoms Started senna-docusate qHS Discontinued IV morphine     Goals of Care and Additional Recommendations: Limitations on Scope of Treatment: Full Comfort Care  Code Status:    Code Status Orders  (From admission, onward)           Start     Ordered   09/24/22 1040  Do not attempt resuscitation (DNR)  Continuous       Question Answer Comment  In the event of cardiac or respiratory ARREST Do not call a "code blue"   In the event of cardiac or respiratory ARREST Do not perform Intubation, CPR, defibrillation or ACLS   In the event of cardiac or respiratory ARREST Use medication by any route, position, wound care, and other measures to relive pain and suffering. May use oxygen, suction and manual treatment of airway obstruction as needed for comfort.      09/24/22 1041           Code Status History     Date Active Date Inactive Code Status Order ID Comments User Context   09/22/2022 2026 09/24/2022 1041 Full Code [id number]  Sanjuan Dame, MD ED   05/11/2020 1122 05/12/2020 1958 Full Code [id number]  Arne Cleveland, MD Inpatient   09/09/2019 1732 09/15/2019 1757 Full Code [id number]  Karmen Bongo, MD ED       Prognosis:  < 6 months  Discharge Planning: Home with Hospice  Care plan was discussed with primary RN, patient, patient's Doristine Bosworth and to be HCPOA per patient's permission, TOC, Dr. Posey Pronto  Thank you for allowing the Palliative Medicine Team to assist in the care of this patient.  Lin Landsman, NP  Please contact Palliative Medicine Team phone at [phone] for  questions and concerns.   *Portions of this note are a verbal dictation therefore any spelling and/or grammatical errors are due to the "[place] [facility]" system interpretation.

## 2022-09-25 NOTE — Evaluation (Signed)
Occupational Therapy Evaluation Patient Details Name: Donald Aguilar MRN: [id number] DOB: [birth year] Today's Date: 09/25/2022   History of Present Illness 71 y.o. 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 Transitioned to comfort care and would like hospice at home.  DUK:[id number] HCV/alcohol-related cirrhosis complicated by [facility] s/p ablation, ascites, and non-bleeding varices s/p TIPS in 2021, grade 2 esophageal varices, .   Clinical Impression   Pt in bed upon therapy arrival and agreeable to participate in OT evaluation. Pt is currently functioning at baseline of Mod I for BADL tasks. Pt lives in a 1 level home with roommates that do not assist him. He recently experienced a fall due to dizziness at home. Pt reports mild dizziness during evaluation although able to complete all requested tasks safely and efficiently with increased time if needed. At this time, pt does not require any additional OT services; will sign off.       Recommendations for follow up therapy are one component of a multi-disciplinary discharge planning process, led by the attending physician.  Recommendations may be updated based on patient status, additional functional criteria and insurance authorization.   Follow Up Recommendations  No OT follow up     Assistance Recommended at Discharge None     Functional Status Assessment  Patient has not had a recent decline in their functional status  Equipment Recommendations  Tub/shower seat       Precautions / Restrictions Precautions Precautions: Fall Precaution Comments: fell in November due to dizziness Restrictions Weight Bearing  Restrictions: No      Mobility Bed Mobility Overal bed mobility: Modified Independent     General bed mobility comments: increased time and effort and minor use of bed rail to come up to EOB from flattened position, mild underlying dizziness Patient Response: Cooperative  Transfers Overall transfer level: Needs assistance Equipment used: None Transfers: Sit to/from Stand Sit to Stand: Supervision           General transfer comment: supervision for safety with power up and self steadying, continued mild underlying dizziness      Balance Overall balance assessment: Mild deficits observed, not formally tested     ADL either performed or assessed with clinical judgement   ADL Overall ADL's : At baseline;Modified independent     General ADL Comments: Tasks completed at a slow pace although safely and efficiently     Vision Baseline Vision/History: 0 No visual deficits Ability to See in Adequate Light: 0 Adequate Patient Visual Report: No change from baseline Vision Assessment?: No apparent visual deficits            Pertinent Vitals/Pain Pain Assessment Pain Assessment: 0-10 Pain Score: 4  Pain Location: Right lower abdomen Pain Descriptors / Indicators: Squeezing Pain Intervention(s): Monitored during session, RN gave pain meds during session     Hand Dominance Right   Extremity/Trunk Assessment Upper Extremity Assessment Upper Extremity Assessment: Overall WFL for tasks assessed   Lower Extremity Assessment Lower Extremity Assessment: Defer to PT evaluation       Communication Communication Communication: Prefers language other than [place];Interpreter utilized (interpreter: [employer])   Cognition Arousal/Alertness: Awake/alert Behavior During Therapy: WFL for tasks assessed/performed Overall Cognitive Status: Within Functional Limits  for tasks assessed       General Comments  SpO2 >90% on RA, dizziness througout session however VSS             Home Living Family/patient expects to be discharged to:: Private residence Living Arrangements: Non-relatives/Friends Available Help at Discharge: Other (Comment) (Lives with 4 roommates although they do not provide any assistance to patient) Type of Home: House Home Access: Level entry     Home Layout: One level     Bathroom Shower/Tub: Walk-in shower         Home Equipment: Hand held shower head          Prior Functioning/Environment Prior Level of Function : Independent/Modified Independent     Mobility Comments: dizzy but able to walk 2 mi to grocery store prior to fall in mid November ADLs Comments: independent in [place].        OT Problem List: Decreased strength      OT Treatment/Interventions:   Eval only   OT Goals(Current goals can be found in the care plan section) Acute Rehab OT Goals Patient Stated Goal: to go home  OT Frequency:  One time visit    Co-evaluation PT/OT/SLP Co-Evaluation/Treatment: Yes Reason for Co-Treatment: Other (comment) (to utilize interpreter)   OT goals addressed during session: ADL's and self-care;Strengthening/ROM      AM-PAC OT "6 Clicks" Daily Activity     Outcome Measure Help from another person eating meals?: None Help from another person taking care of personal grooming?: None Help from another person toileting, which includes using toliet, bedpan, or urinal?: None Help from another person bathing (including washing, rinsing, drying)?: None Help from another person to put on and taking off regular upper body clothing?: None Help from another person to put on and taking off regular lower body clothing?: None 6 Click Score: 24   End of Session Equipment Utilized During Treatment: Gait belt  Activity Tolerance: Patient tolerated treatment well Patient left: in chair;with call bell/phone within reach;Other (comment) (With PT and interpreter in room)  OT Visit Diagnosis: Unsteadiness on feet (R26.81)                 Time: 9629-5284 OT Time Calculation (min): 32 min Charges:  OT General Charges $OT Visit: 1 Visit OT Evaluation $OT Eval Low Complexity: 1 Low  Ailene Ravel, OTR/L,CBIS  Supplemental OT - [facility] and [facility]   Maurion Walk[place]ak, Clarene Duke 09/25/2022, 12:05 PM

## 2022-09-25 NOTE — Progress Notes (Addendum)
HD#3 Subjective:   Summary: Donald Aguilar is a 71 y.o. male with past medical history of hep C, alcoholic cirrhosis status post TIPS, HCC status post ablation, concern for new HCC with right hepatic vein invasion who presented to the emergency room with concerns of coffee-ground emesis.  Patient found to have low hemoglobin, and admitted for further evaluation and management of acute blood loss anemia likely secondary to esophageal varices.  Patient found to have extensive hepatocellular carcinoma with invasion into main portal vein with TIPS stent fully occluded, with no viable treatment options.  Patient has chosen to pursue palliative/hospice care.   Overnight Events: No overnight events.   Patient is evaluated at bedside with interpretor.  Patient reports that he is doing well.  He does report some lightheadedness when walking. He denies any falls.  With ongoing conversations, patient states that he does not have much help at home, but still would like to go home.  He states that he can take care of himself with no concerns.  He states he would like to walk around and get fresh air.  He would like to do his daily rounds.  It seems as if patient understands that he is going to die but it is unclear, if patient knows about how soon death is a possibility.  He states that he would not come back to the hospital if he were to go home, as he understands that his disease is not curable.  Objective:  Vital signs in last 24 hours: Vitals:   09/24/22 1916 09/24/22 2105 09/25/22 0439 09/25/22 0740  BP: 113/74 100/63 109/77 123/80  Pulse: 74 78  73  Resp: '17 17  20  '$ Temp: 98.2 F (36.8 C) (!) 97.3 F (36.3 C) 98.2 F (36.8 C) (!) 97.4 F (36.3 C)  TempSrc: Oral Oral Oral Axillary  SpO2: 95% 95%  97%  Weight:   63 kg   Height:       Supplemental O2: Room Air SpO2: 97 %  Physical Exam:  Constitutional: Patient resting in bed in no acute distress Eyes: conjunctive pallor.  Cardiovascular:  regular rate and rhythm, no m/r/g Pulmonary/Chest: normal work of breathing on room air, lungs clear to auscultation bilaterally Abdominal: Soft with no tenderness appreciated on exam  Filed Weights   09/22/22 1444 09/24/22 0623 09/25/22 0439  Weight: 63 kg 63 kg 63 kg     Intake/Output Summary (Last 24 hours) at 09/25/2022 1041 Last data filed at 09/25/2022 0435 Gross per 24 hour  Intake 696.15 ml  Output --  Net 696.15 ml   Net IO Since Admission: 1,063.13 mL [09/25/22 1041]  Pertinent Labs:    Latest Ref Rng & Units 09/24/2022    7:13 AM 09/24/2022    1:07 AM 09/23/2022    7:45 PM  CBC  WBC 4.0 - 10.5 K/uL 8.2  9.2  9.1   Hemoglobin 13.0 - 17.0 g/dL 7.9  7.9  8.3   Hematocrit 39.0 - 52.0 % 22.6  22.6  24.6   Platelets 150 - 400 K/uL 106  106  114        Latest Ref Rng & Units 09/24/2022    1:07 AM 09/23/2022    7:07 AM 09/22/2022    3:13 PM  CMP  Glucose 70 - 99 mg/dL 116  117  135   BUN 8 - 23 mg/dL 19  35  53   Creatinine 0.61 - 1.24 mg/dL 0.95  0.95  1.26  Sodium 135 - 145 mmol/L 136  135  134   Potassium 3.5 - 5.1 mmol/L 3.9  3.6  4.0   Chloride 98 - 111 mmol/L 106  107  104   CO2 22 - 32 mmol/L '22  20  21   '$ Calcium 8.9 - 10.3 mg/dL 7.7  7.6  7.8   Total Protein 6.5 - 8.1 g/dL 5.1  5.0  5.8   Total Bilirubin 0.3 - 1.2 mg/dL 1.3  1.9  0.7   Alkaline Phos 38 - 126 U/L 90  92  114   AST 15 - 41 U/L 47  59  79   ALT 0 - 44 U/L 40  45  60     Imaging: No results found.  Assessment/Plan:   Principal Problem:   Acute GI bleeding   Patient Summary: Donald Aguilar is a 71 y.o. male with past medical history of hep C, alcoholic cirrhosis status post TIPS, HCC status post ablation, concern for new HCC with right hepatic vein invasion who presented to the emergency room with concerns of coffee-ground emesis.  Patient found to have low hemoglobin, and admitted for further evaluation and management of acute blood loss anemia likely secondary to esophageal varices.   Patient found to have extensive hepatocellular carcinoma with invasion into main portal vein with TIPS stent fully occluded, with no viable treatment options.  Patient has chosen to pursue palliative/hospice care.  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  After long talks with palliative care, GI, and IR, further treatment will not be pursued, and likely this will lead to his demise.  Patient is understanding about this, and would like to pursue palliative/hospice care  Comfort care measures have been initiated.  Blood draws have been discontinued, antibiotics have been discontinued, fingersticks have been discontinued, and comfort measures optimized.  Spoke with patient, and patient reports that he does not have good support at home. He states that he would like to live out the rest of his days at home where he can take his daily walks and get fresh air.  He states he would like to go home if possible and does not wish to return to the hospital.  Physical therapy saw him today and reports he did well and does not need follow-up. They do anticipate he will need additional DME in the future.  Currently waiting on hospice agency to accept patient.  -Switch to oral PPI -Discontinue octreotide -Comfort care measures -Palliative care following -Awaiting hospice set up  #Prerenal acute kidney injury, resolved Most recent labs showing prerenal AKI resolved after 2 units of packed red blood cells. -Currently not monitoring labs as it is comfort care  Patient is comfort care, please keep patient as comfortable as possible.  Diet: Regular IVF: None VTE: SCDs Code: DNR  Dispo: Anticipated discharge to home hospice or residential hospice    [facility] Resident PGY-1 [phone] Please contact the on call pager after 5 pm and on weekends at [phone].

## 2022-09-25 NOTE — Progress Notes (Signed)
This chaplain responded to PMT PA-Josseline's consult for creating/updating the Pt. Advance Directive.  This chaplain contacted and confirmed Sherrlyn Hock interpreter services([phone]) for HCPOA notarization on Friday 12/8 '@10am'$ .   The chaplain understands Eugenie Norrie is scheduled for in person presence in the Pt. room.  Chaplain Sallyanne Kuster [phone]

## 2022-09-25 NOTE — Evaluation (Signed)
Physical Therapy Evaluation Patient Details Name: Donald Aguilar MRN: [id number] DOB: 22-Aug-1951 Today's Date: 09/25/2022  History of Present Illness  71 y.o. 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 Transitioned to comfort care and would like hospice at home.  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  Clinical Impression  PTA pt living in single story home with level entry, lives with 3 other people however they do not provide any assist even when he fell in November. Prior to fall pt able to ambulate 53m to grocery store without DME, and provide for all iADLs and ADLs. Pt reports that he feels pretty close to how he felt before he had fall that precipitated hospitalization. Pt reports underlying dizziness at all times and is not worsened with positional change. BP rises with mobility. Pt is mod I for bed mobility and supervision for transfers and ambulation without AD. Given progression of disease pt will likely need additional DME in the future but has no PT or DME at this time. PT will refer to Mobility Specialist and discharge from PT caseload.       Recommendations for follow up therapy are one component of a multi-disciplinary discharge planning process, led by the attending physician.  Recommendations may be updated based on patient status, additional functional criteria and insurance authorization.  Follow Up Recommendations No PT follow up      Assistance Recommended at Discharge None     Equipment Recommendations None recommended by PT  (none recommended at this time, someone during course of his stay recommended hospital bed, will likely need DME with progression of disease)  Recommendations for Other Services       Functional Status Assessment Patient has not had a recent decline in their functional status     Precautions / Restrictions Precautions Precautions: Fall Precaution Comments: fell in November due to dizziness Restrictions Weight Bearing Restrictions: No      Mobility  Bed Mobility Overal bed mobility: Modified Independent             General bed mobility comments: increased time and effort and minor use of bed rail to come up to EoB from flattened position, mild underlying dizziness    Transfers Overall transfer level: Needs assistance Equipment used: None Transfers: Sit to/from Stand Sit to Stand: Supervision           General transfer comment: supervision for safety with power up and self steadying, continued mild underlying dizziness    Ambulation/Gait Ambulation/Gait assistance: Min guard, Supervision Gait Distance (Feet): 300 Feet Assistive device: None Gait Pattern/deviations: Step-through pattern, Decreased step length - right, Decreased step length - left, Shuffle, Drifts right/left Gait velocity: WFL Gait velocity interpretation: >2.62 ft/sec, indicative of community ambulatory   General Gait Details: min guard for safety initially however progresses to supervision, while in room reaches out for UE support on bed rail and furniture in room, out in hall oPitkinreaches for handrail but is not dependent on it and has no overt LoB        Balance Overall balance assessment: Mild deficits observed, not formally tested  Pertinent Vitals/Pain Pain Assessment Pain Assessment: 0-10 Pain Score: 4  Pain Location: R abdomen Pain Descriptors / Indicators: Squeezing Pain Intervention(s): Monitored during session,  Patient requesting pain meds-RN notified    Home Living Family/patient expects to be discharged to:: Private residence Living Arrangements: Non-relatives/Friends Available Help at Discharge: Friend(s);Other (Comment) (however do not assist with anything) Type of Home: House Home Access: Level entry       Home Layout: One level Home Equipment: Hand held shower head      Prior Function Prior Level of Function : Independent/Modified Independent             Mobility Comments: dizzy but able to walk 2 mi to grocery store prior to fall in mid November ADLs Comments: independent in iADLS.        Extremity/Trunk Assessment   Upper Extremity Assessment Upper Extremity Assessment: Defer to OT evaluation    Lower Extremity Assessment Lower Extremity Assessment: Overall WFL for tasks assessed       Communication   Communication: Prefers language other than [place];Interpreter utilized  Cognition Arousal/Alertness: Awake/alert Behavior During Therapy: WFL for tasks assessed/performed Overall Cognitive Status: Within Functional Limits for tasks assessed                                          General Comments General comments (skin integrity, edema, etc.): SpO2 >90%O2 on RA, dizziness throughout session however VSS        Assessment/Plan    PT Assessment Patient does not need any further PT services         PT Goals (Current goals can be found in the Care Plan section)  Acute Rehab PT Goals PT Goal Formulation: With patient     AM-PAC PT "6 Clicks" Mobility  Outcome Measure Help needed turning from your back to your side while in a flat bed without using bedrails?: None Help needed moving from lying on your back to sitting on the side of a flat bed without using bedrails?: None Help needed moving to and from a bed to a chair (including a wheelchair)?: None Help needed standing up from a chair using your arms (e.g., wheelchair or bedside chair)?:  None Help needed to walk in hospital room?: None Help needed climbing 3-5 steps with a railing? : A Little 6 Click Score: 23    End of Session Equipment Utilized During Treatment: Gait belt Activity Tolerance: Patient tolerated treatment well Patient left: in chair;with call bell/phone within reach;with chair alarm set Nurse Communication: Mobility status;Other (comment) (increased BP with mobilization) PT Visit Diagnosis: Muscle weakness (generalized) (M62.81);Unsteadiness on feet (R26.81);Pain Pain - part of body:  (abdomen)    Time: 3151-7616 PT Time Calculation (min) (ACUTE ONLY): 45 min   Charges:   PT Evaluation $PT Eval Moderate Complexity: 1 Mod PT Treatments $Therapeutic Activity: 8-22 mins        Justene Jensen B. Migdalia Dk PT, DPT Acute Rehabilitation Services Please use secure chat or  Call Office [phone]   [place] 09/25/2022, 10:12 AM

## 2022-09-25 NOTE — Plan of Care (Signed)
Pt is independent in the room. Pt has a good appetite.  Problem: Education: Goal: Knowledge of General Education information will improve Description: Including pain rating scale, medication(s)/side effects and non-pharmacologic comfort measures Outcome: Progressing   Problem: Health Behavior/Discharge Planning: Goal: Ability to manage health-related needs will improve Outcome: Progressing   Problem: Clinical Measurements: Goal: Ability to maintain clinical measurements within normal limits will improve Outcome: Progressing Goal: Will remain free from infection Outcome: Progressing Goal: Diagnostic test results will improve Outcome: Progressing Goal: Respiratory complications will improve Outcome: Progressing Goal: Cardiovascular complication will be avoided Outcome: Progressing   Problem: Activity: Goal: Risk for activity intolerance will decrease Outcome: Progressing   Problem: Nutrition: Goal: Adequate nutrition will be maintained Outcome: Progressing   Problem: Coping: Goal: Level of anxiety will decrease Outcome: Progressing   Problem: Elimination: Goal: Will not experience complications related to bowel motility Outcome: Progressing Goal: Will not experience complications related to urinary retention Outcome: Progressing   Problem: Pain Managment: Goal: General experience of comfort will improve Outcome: Progressing   Problem: Safety: Goal: Ability to remain free from injury will improve Outcome: Progressing   Problem: Skin Integrity: Goal: Risk for impaired skin integrity will decrease Outcome: Progressing   Problem: Education: Goal: Knowledge of the prescribed therapeutic regimen will improve Outcome: Progressing   Problem: Coping: Goal: Ability to identify and develop effective coping behavior will improve Outcome: Progressing   Problem: Clinical Measurements: Goal: Quality of life will improve Outcome: Progressing   Problem:  Respiratory: Goal: Verbalizations of increased ease of respirations will increase Outcome: Progressing   Problem: Role Relationship: Goal: Family's ability to cope with current situation will improve Outcome: Progressing Goal: Ability to verbalize concerns, feelings, and thoughts to partner or family member will improve Outcome: Progressing   Problem: Pain Management: Goal: Satisfaction with pain management regimen will improve Outcome: Progressing

## 2022-09-25 NOTE — TOC Progression Note (Signed)
Transition of Care [facility]) - Progression Note    Patient Details  Name: Donald Aguilar MRN: [id number] Date of Birth: [birth year]  Transition of Care [facility]) CM/SW Contact  Loletha Grayer Beverely Pace, RN Phone Number: 09/25/2022, 11:34 AM  Clinical Narrative:    Case manger spoke with patient using [place] interpreter, Emogene Morgan [phone]. She is available until 5pm today. Patient is in agreement to having hospice services at home. Agreed with referral being called to [facility].    Expected Discharge Plan: Home w Hospice Care Barriers to Discharge: No Barriers Identified  Expected Discharge Plan and Services Expected Discharge Plan: [place]   Discharge Planning Services: CM Consult Post Acute Care Choice: Hospice                   DME Arranged: N/A           HH Agency[facility] [place]         Social Determinants of Health (SDOH) Interventions    Readmission Risk Interventions     No data to display

## 2022-09-25 NOTE — Care Management Important Message (Signed)
Important Message  Patient Details  Name: Donald Aguilar MRN: [id number] Date of Birth: 09/10/1951   Medicare Important Message Given:  Yes     Shelda Altes 09/25/2022, 12:50 PM

## 2022-09-25 NOTE — Progress Notes (Signed)
[facility]) Hospital Liaison Note   Received request from [facility], Ricki Miller, RN, for hospice services at home after discharge.    Spoke with patient via an interpreter to initiate education related to hospice philosophy, services, and team approach to care. Mr. Donald Aguilar verbalized understanding of information given. Per discussion, the plan is for patient to discharge home via private car once cleared to DC.    DME needs discussed. Patient has the following equipment in the home (Purchased privately): None Patient requests the following equipment for delivery: Hospital bed, over bed table, and cane.   Address verified and is correct in the chart. Hoyle Sauer is the contact to arrange time of equipment delivery.    Please send signed and completed DNR home with patient/family. Please provide prescriptions at discharge as needed to ensure ongoing symptom management.    [facility] information and contact numbers given to family & above information shared with TOC.   Please call with any questions/concerns.    Thank you for the opportunity to participate in this patient's care.   Zigmund Gottron  [facility] Liaison  [phone]

## 2022-09-26 ENCOUNTER — Other Ambulatory Visit (HOSPITAL_COMMUNITY): Payer: Self-pay

## 2022-09-26 DIAGNOSIS — D62 Acute posthemorrhagic anemia: Secondary | ICD-10-CM | POA: Diagnosis not present

## 2022-09-26 DIAGNOSIS — C220 Liver cell carcinoma: Secondary | ICD-10-CM | POA: Diagnosis not present

## 2022-09-26 DIAGNOSIS — I8511 Secondary esophageal varices with bleeding: Secondary | ICD-10-CM

## 2022-09-26 DIAGNOSIS — K920 Hematemesis: Secondary | ICD-10-CM | POA: Diagnosis not present

## 2022-09-26 DIAGNOSIS — D649 Anemia, unspecified: Secondary | ICD-10-CM | POA: Diagnosis not present

## 2022-09-26 DIAGNOSIS — Z515 Encounter for palliative care: Secondary | ICD-10-CM | POA: Diagnosis not present

## 2022-09-26 MED ORDER — ARTIFICIAL TEARS 0.2-0.2-1 % OP SOLN
1.0000 [drp] | Freq: Four times a day (QID) | OPHTHALMIC | 0 refills | Status: DC | PRN
  Filled 2022-09-26: qty 15, 30d supply, fill #0

## 2022-09-26 MED ORDER — BIOTENE DRY MOUTH MT LIQD: 15.0000 mL | OROMUCOSAL | Status: DC | PRN

## 2022-09-26 MED ORDER — MORPHINE SULFATE ER 15 MG PO TBCR: 15.0000 mg | EXTENDED_RELEASE_TABLET | Freq: Two times a day (BID) | ORAL | 0 refills | Status: DC

## 2022-09-26 MED ORDER — PANTOPRAZOLE SODIUM 40 MG PO TBEC: 40.0000 mg | DELAYED_RELEASE_TABLET | Freq: Two times a day (BID) | ORAL | 0 refills | Status: DC

## 2022-09-26 MED ORDER — ACETAMINOPHEN 325 MG PO TABS
650.0000 mg | ORAL_TABLET | Freq: Four times a day (QID) | ORAL | 0 refills | Status: DC | PRN
  Filled 2022-09-26: qty 30, 4d supply, fill #0

## 2022-09-26 MED ORDER — SENNOSIDES-DOCUSATE SODIUM 8.6-50 MG PO TABS
1.0000 | ORAL_TABLET | Freq: Every day | ORAL | 0 refills | Status: DC
  Filled 2022-09-26: qty 10, 10d supply, fill #0

## 2022-09-26 MED ORDER — DIPHENHYDRAMINE HCL 50 MG/ML IJ SOLN
12.5000 mg | INTRAMUSCULAR | 0 refills | Status: DC | PRN
  Filled 2022-09-26: qty 10, 7d supply, fill #0

## 2022-09-26 MED ORDER — LORAZEPAM 1 MG PO TABS
1.0000 mg | ORAL_TABLET | ORAL | 0 refills | Status: DC | PRN
  Filled 2022-09-26: qty 10, 2d supply, fill #0

## 2022-09-26 MED ORDER — ONDANSETRON 4 MG PO TBDP
4.0000 mg | ORAL_TABLET | Freq: Four times a day (QID) | ORAL | 0 refills | Status: DC | PRN
  Filled 2022-09-26: qty 20, 5d supply, fill #0

## 2022-09-26 MED ORDER — BISACODYL 10 MG RE SUPP
10.0000 mg | Freq: Every day | RECTAL | 0 refills | Status: DC | PRN
  Filled 2022-09-26: qty 12, 12d supply, fill #0

## 2022-09-26 MED ORDER — GLYCOPYRROLATE 1 MG PO TABS: 1.0000 mg | ORAL_TABLET | ORAL | 0 refills | Status: DC | PRN

## 2022-09-26 MED ORDER — OXYCODONE HCL 5 MG PO TABS
5.0000 mg | ORAL_TABLET | ORAL | 0 refills | Status: DC | PRN
  Filled 2022-09-26: qty 10, 2d supply, fill #0

## 2022-09-26 NOTE — Progress Notes (Signed)
AVS instructions given to Donald Aguilar via phone call. Meds delivered to bedside. Pt transported to pick-up car line via wheelchair.

## 2022-09-26 NOTE — Progress Notes (Signed)
Manufacturing engineer [facility])  Received request from [facility], for hospice services with [facility] at discharge. Spoke with Arbie Cookey and patient together over the phone for conformation of transportation home.   Patient is set for discharge. TOC aware as well as bedside RN. DME is in place per his roommate EP.   Please send signed and completed DNR home with patient/family. Please provide prescriptions at discharge as needed to ensure ongoing symptom management.   Clementeen Hoof, [place], [facility] [phone]

## 2022-09-26 NOTE — Progress Notes (Signed)
This chaplain completed AD education with the Pt. The Pt. in-person interpreter is at the bedside.  The chaplain offered the opportunity for clarifying questions.  The chaplain is present with the Pt., interpreter, notary, and witnesses. The Pt. names Donald Aguilar as his healthcare agent.  The Pt. is not completing a Living Will.  The chaplain gave the Pt. the original AD along with one copy. The chaplain scanned the Pt. AD into his EMR.  This chaplain is available for F/U spiritual care as needed.  Chaplain Sallyanne Kuster [phone]

## 2022-09-26 NOTE — Discharge Summary (Addendum)
Name: Donald Aguilar MRN: [id number] DOB: 04/15/1951 71 y.o. PCP: Linward Natal, MD  Date of Admission: 09/22/2022  2:35 PM Date of Discharge: 09/26/2022 Attending Physician: Dr. Charise Killian   Discharge Diagnosis: Principal Problem:   Hepatocellular carcinoma [facility]) Active Problems:   Cirrhosis of liver ([place])   Acute GI bleeding    Discharge Medications: Allergies as of 09/26/2022   No Known Allergies      Medication List     TAKE these medications    acetaminophen 325 MG tablet Commonly known as: TYLENOL Take 2 tablets (650 mg total) by mouth every 6 (six) hours as needed for mild pain (or Fever >/= 101).   antiseptic oral rinse Liqd Apply 15 mLs topically as needed for dry mouth.   Artificial Tears 0.2-0.2-1 % Soln Generic drug: Glycerin-Hypromellose-PEG 400 Place 1 drop into both eyes 4 (four) times daily as needed for dry eyes   bisacodyl 10 MG suppository Commonly known as: DULCOLAX Place 1 suppository (10 mg total) rectally daily as needed for moderate constipation.   diphenhydrAMINE 50 MG/ML injection Commonly known as: BENADRYL Inject 0.25 mLs (12.5 mg total) into the vein every 4 (four) hours as needed for itching.   glycopyrrolate 1 MG tablet Commonly known as: ROBINUL Take 1 tablet (1 mg total) by mouth every 4 (four) hours as needed (excessive secretions).   LORazepam 1 MG tablet Commonly known as: ATIVAN Take 1 tablet (1 mg total) by mouth every 4 (four) hours as needed for anxiety.   morphine 15 MG 12 hr tablet Commonly known as: MS CONTIN Take 1 tablet (15 mg total) by mouth every 12 (twelve) hours.   ondansetron 4 MG disintegrating tablet Commonly known as: ZOFRAN-ODT Take 1 tablet (4 mg total) by mouth every 6 (six) hours as needed for nausea.   oxyCODONE 5 MG immediate release tablet Commonly known as: Oxy IR/ROXICODONE Take 1 tablet (5 mg total) by mouth every 3 (three) hours as needed for breakthrough pain.   pantoprazole 40 MG  tablet Commonly known as: PROTONIX Take 1 tablet (40 mg total) by mouth 2 (two) times daily.   senna-docusate 8.6-50 MG tablet Commonly known as: Senokot-S Take 1 tablet by mouth at bedtime.               Durable Medical Equipment  (From admission, onward)           Start     Ordered   09/26/22 0723  For home use only DME Cane  Once        09/26/22 5009   09/26/22 0723  For home use only DME Overbed table  Once        09/26/22 3818   09/26/22 0721  For home use only DME Hospital bed  Once       Question Answer Comment  Length of Need 12 Months   Patient has (list medical condition): End stage liver cancer   Bed type Semi-electric      09/26/22 2993            Disposition and follow-up:   Donald Aguilar was discharged from [facility] in stable condition.  At the hospital follow up visit please address:  1.  Follow-up:  a.  Progressive HCC with main hepatic vein invasion and TIPS occlusion: Patient discharged with home hospice.  2.  Labs / imaging needed at time of follow-up: N/A  3.  Pending labs/ test needing follow-up: N/A  4.  Medication Changes  Comfort measure initiated  Follow-up Appointments:  Follow-up Information     [facility] Follow up.   Specialty: Hospice and Palliative Medicine Why: referral for home hospice and equipment Contact information: [place] Hospital Course by problem list:  #Progressive [facility] with main hepatic vein invasion #Grade 2 esophageal varices #Completely occluded TIPS stent #Acute blood loss anemia Patient initially presented with concerns of coffee-ground emesis for the past 2 to 3 days before admission.  Patient reports that he was having lots of nausea and vomiting.  He felt weak, lightheaded, and dizzy.  He notes that his p.o. intake was poor.  He has had multiple coffee-ground emesis that he had noted.   Patient's initial hemoglobin was low at 6.5.  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  Patient also had grade 2 esophageal varices, and had a TIPS procedure.  Since, [facility] has progressed, and imaging on this admission showed progression into the main hepatic vein with complete occlusion of TIPS stent.  With multiple specialties stating that there were no interventions that could take place, palliative care was introduced.  Long discussions occurred in the hospital regarding patient goals of care, and with no viable treatment options, patient opted to go comfort care.  Comfort measures were started in the hospital, and patient will be discharged on home hospice.  Discharge Subjective: Patient reports he is doing well.  He has no concerns.  He states he has made peace with his diagnosis, and is ready go home.  Discharge Exam:   BP 135/88   Pulse 96   Temp 98.5 F (36.9 C) (Oral)   Resp 20   Ht '5\' 6"'$  (1.676 m)   Wt 63 kg   SpO2 92%   BMI 22.42 kg/m  Constitutional: Resting in bed in no acute distress HENT: normocephalic atraumatic, mucous membranes moist Eyes: conjunctiva non-erythematous Pulmonary/Chest: normal work of breathing on room air   Pertinent Labs, Studies, and Procedures:     Latest Ref Rng & Units 09/24/2022    7:13 AM 09/24/2022    1:07 AM 09/23/2022    7:45 PM  CBC  WBC 4.0 - 10.5 K/uL 8.2  9.2  9.1   Hemoglobin 13.0 - 17.0 g/dL 7.9  7.9  8.3   Hematocrit 39.0 - 52.0 % 22.6  22.6  24.6   Platelets 150 - 400 K/uL 106  106  114        Latest Ref Rng & Units 09/24/2022    1:07 AM 09/23/2022    7:07 AM 09/22/2022    3:13 PM  CMP  Glucose 70 - 99 mg/dL 116  117  135   BUN 8 - 23 mg/dL 19  35  53   Creatinine 0.61 - 1.24 mg/dL 0.95  0.95  1.26   Sodium 135 - 145 mmol/L 136  135  134   Potassium 3.5 - 5.1 mmol/L 3.9  3.6  4.0   Chloride 98 - 111 mmol/L 106  107  104   CO2 22 - 32 mmol/L '22  20  21   '$ Calcium 8.9 - 10.3  mg/dL 7.7  7.6  7.8   Total Protein 6.5 - 8.1 g/dL 5.1  5.0  5.8   Total Bilirubin 0.3 - 1.2 mg/dL 1.3  1.9  0.7   Alkaline Phos 38 - 126  U/L 90  92  114   AST 15 - 41 U/L 47  59  79   ALT 0 - 44 U/L 40  45  60     CT ANGIO GI BLEED  Result Date: 09/23/2022 CLINICAL DATA:  71 year old with history of hepatocellular carcinoma and status post TIPS procedure. Patient presents with hematemesis. EXAM: CTA ABDOMEN AND PELVIS WITHOUT AND WITH CONTRAST TECHNIQUE: Multidetector CT imaging of the abdomen and pelvis was performed using the standard protocol during bolus administration of intravenous contrast. Multiplanar reconstructed images and MIPs were obtained and reviewed to evaluate the vascular anatomy. RADIATION DOSE REDUCTION: This exam was performed according to the departmental dose-optimization program which includes automated exposure control, adjustment of the mA and/or kV according to patient size and/or use of iterative reconstruction technique. CONTRAST:  89m OMNIPAQUE IOHEXOL 350 MG/ML SOLN COMPARISON:  09/02/2022 FINDINGS: VASCULAR Aorta: Atherosclerotic disease in the abdominal aorta without aneurysm, dissection or significant stenosis. Celiac: Common trunk for the celiac artery and SMA. This common trunk is widely patent. Splenic artery and common hepatic artery patent. GDA is patent without gross abnormality. Hepatic arteries are patent. SMA: SMA is patent. Renals: Both renal arteries are patent without evidence of aneurysm, dissection, vasculitis, fibromuscular dysplasia or significant stenosis. IMA: Patent without evidence of aneurysm, dissection, vasculitis or significant stenosis. Inflow: Atherosclerotic disease in the bilateral iliac arteries. Common, internal and external iliac arteries are patent bilaterally. No significant stenosis. Negative for dissection or aneurysm. Right common iliac artery is mildly ectatic measuring up to 1.3 cm. Proximal Outflow: Proximal femoral arteries are  patent bilaterally. Veins: TIPS stent is now occluded. 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 There is evidence for enhancing clot in the main portal vein on image 20/22. There is a small amount of flow in the main portal vein and there continues to be flow in the left portal veins. There appears to be bland thrombus at the portal confluence near the junction of the splenic vein and SMV. Iliac veins and IVC are patent. Bilateral renal veins are patent. Prominent esophageal varices. Review of the MIP images confirms the above findings. NON-VASCULAR Lower chest: Trace right pleural fluid. Hepatobiliary: Again noted is heterogeneous tumor in the liver involving segments 7 and 8. Again noted is tumor thrombus involving the right portal veins. Tumor thrombus now extends into the main portal vein. Heterogeneous tumor in the hepatic dome measures 6.8 x 6.5 cm on sequence 6, image 22. Liver has a nodular contour and compatible with underlying cirrhosis. Ablation defects involving the posterior left hepatic lobe. Again noted is left intrahepatic biliary dilatation. Cholelithiasis without significant gallbladder distension. Pancreas: Unremarkable. No pancreatic ductal dilatation or surrounding inflammatory changes. Spleen: Normal in size without focal abnormality. Adrenals/Urinary Tract: Adrenal glands are within normal limits. No hydronephrosis. No suspicious renal lesions. 4 mm nonobstructive stone in left kidney lower pole. Additional small left renal stones. Probable small cysts in left kidney that do not require dedicated follow-up. Urinary bladder is mildly thickened but otherwise unremarkable. Stomach/Bowel: Diffuse thickening in the distal esophagus and findings compatible with prominent esophageal varices. Duodenum and jejunum demonstrate diffuse wall thickening and likely  secondary to venous congestion from the portal vein thrombus. Diffuse wall thickening in the rectum. There is a bowel lipoma on image 166/17 measuring up to 1.4 cm. Not clear if this lipoma is  in small or large bowel due to the edematous nature of the bowel. Wall thickening and edema involving the ascending dand escending colon. No evidence for pneumatosis. No evidence for bowel obstruction. Lymphatic: No significant lymph node enlargement in the abdomen or pelvis. Reproductive: Prostate is unremarkable. Other: New abdominal ascites. Small amount of ascites in the lower abdomen and small amount of perihepatic ascites. No evidence for free air. Bilateral inguinal hernias containing fat and there is ascites extending into the inguinal canals, right side greater than left. Musculoskeletal: No acute bone abnormality. IMPRESSION: VASCULAR 1. 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 There appears to be bland thrombus near the portal vein confluence. Again noted is tumor thrombus extending throughout the right portal veins. 2. Prominent esophageal varices. 3. TIPS stent is occluded. 4. Splenic vein and mesenteric veins remain patent but there is thrombus at the portal vein confluence. NON-VASCULAR 1. Progression of the hepatocellular carcinoma with tumor thrombus extending into the main portal vein. 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 Again noted is tumor thrombus in the right portal veins with heterogeneous tumor in the right hepatic lobe particularly in segments 7 and 8. 2. Diffuse wall thickening throughout the small and large bowel. Based on the findings in the portal venous system, these findings likely represent bowel venous congestion. 3. New ascites. 4. Nonobstructive left renal calculi. Electronically Signed   By: [name] M.D.   On: 09/23/2022 16:17   US ABDOMEN LIMITED WITH LIVER  DOPPLER  Result Date: 09/23/2022 CLINICAL DATA:  Cirrhosis with known hepatocellular carcinoma. Recent CT of 09/02/2022 documented tumor thrombus in the right portal venous system and in the main portal vein. EXAM: DUPLEX ULTRASOUND OF LIVER AND TIPS SHUNT TECHNIQUE: Color and duplex Doppler ultrasound was performed to evaluate the hepatic in-flow and out-flow vessels. COMPARISON:  CT scan 09/02/2022 FINDINGS: Portal Vein Velocities Main:  0 cm/sec Right:  0 cm/sec Left:  0 cm/sec TIPS Stent Velocities Proximal:  0 cm/sec Mid:  0 Distal:  0 cm/sec IVC: Present and patent with normal respiratory phasicity. Hepatic Vein Velocities Right:  27.6 cm/sec Mid:  0 cm/sec Left:  63.5 cm/sec Splenic Vein: 26.4 Superior Mesenteric Vein: 21.7 Hepatic Artery: 189 cm / second Ascities: Small volume perihepatic ascites evident. Other findings: Irregular mass identified in the right hepatic lobe as better characterized on recent CT of 09/02/2022. Ablation defect in the left liver again noted. IMPRESSION: 1. 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 No discernible flow in the right hepatic vein. 2. Small volume perihepatic ascites. 3. Known right hepatic lobe hepatocellular carcinoma better characterized on recent CT of 09/02/2022. Defect in the left liver is compatible with previously characterized ablation defect. Electronically Signed   By: [name] M.D.   On: 09/23/2022 05:45     Discharge Instructions: Discharge Instructions     Diet - low sodium heart healthy   Complete by: As directed    Increase activity slowly   Complete by: As directed        Mr. Donald Aguilar, It was a pleasure taking care of you at [place] were admitted for complications of Hepatocellular Carcinoma.  You will be discharged on home hospice.  1) Regarding your cancer, it has progressed to a point that we cannot treat it.  You will be going home with home hospice to take care of you.  They will take care  of you and make sure you are as comfortable as you can be.   Take care,  Dr. Leigh Aurora, DO    Signed: Leigh Aurora, DO 09/26/2022, 1:31 PM   Pager: [phone]

## 2022-09-26 NOTE — TOC Transition Note (Signed)
Transition of Care (TOC) - CM/SW Discharge Note Marvetta Gibbons RN, BSN Transitions of Care Unit 4E- RN Case Manager See Treatment Team for direct phone # [phone] cross coverage  Patient Details  Name: Donald Aguilar MRN: [id number] Date of Birth: 1951/09/02  Transition of Care [facility]) CM/SW Contact:  Dawayne Patricia, RN Phone Number: 09/26/2022, 2:52 PM   Clinical Narrative:    Pt stable for transition home w/ Hospice. Home Hospice has been arranged w/ [facility].  Liaison Mellisa O. Has confirmed that DME delivered to the home and pt can transport home. GOLD DNR has been signed and on chart to provide to pt- msg sent to bedside RN to make sure pt is provided GOLD DNR prior to discharge.   [facility] will see pt in the home to enroll in [facility] service on discharge.  CSW has provided pt with Food resources.   No further TOC needs noted.    Final next level of care: Home w Hospice Care Barriers to Discharge: No Barriers Identified   Patient Goals and CMS Choice Patient states their goals for this hospitalization and ongoing recovery are:: return home CMS Medicare.gov Compare Post Acute Care list provided to:: Patient Choice offered to / list presented to : Patient  Discharge Placement                 Home w/ Hospice.       Discharge Plan and Services   Discharge Planning Services: CM Consult Post Acute Care Choice: Hospice          DME Arranged: N/A DME Agency: [facility] Agency: Hospice and [place]        Social Determinants of Health (SDOH) Interventions     Readmission Risk Interventions     No data to display

## 2022-09-26 NOTE — Progress Notes (Signed)
Daily Progress Note   Patient Name: Donald Aguilar       Date: 09/26/2022 DOB: 10-05-1951  Age: 71 y.o. MRN#: [id number] Attending Physician: Charise Killian, MD Primary Care Physician: Linward Natal, MD Admit Date: 09/22/2022  Reason for Consultation/Follow-up: {Reason for Consult:23484}  Subjective: ***  All questions and concerns addressed. Encouraged to call with questions and/or concerns. PMT card provided.  Length of Stay: 4  Current Medications: Scheduled Meds:   morphine  15 mg Oral Q12H   pantoprazole  40 mg Oral BID   senna-docusate  1 tablet Oral QHS    Continuous Infusions:   PRN Meds: acetaminophen **OR** acetaminophen, antiseptic oral rinse, bisacodyl, diphenhydrAMINE, glycopyrrolate **OR** glycopyrrolate **OR** glycopyrrolate, LORazepam **OR** [DISCONTINUED] LORazepam **OR** LORazepam, ondansetron **OR** ondansetron (ZOFRAN) IV, oxyCODONE, polyvinyl alcohol  Physical Exam          Vital Signs: BP 111/83 (BP Location: Right Arm)   Pulse 87   Temp 97.9 F (36.6 C) (Axillary)   Resp 16   Ht '5\' 6"'$  (1.676 m)   Wt 63 kg   SpO2 94%   BMI 22.42 kg/m  SpO2: SpO2: 94 % O2 Device: O2 Device: Room Air O2 Flow Rate:    Intake/output summary:  Intake/Output Summary (Last 24 hours) at 09/26/2022 0904 Last data filed at 09/25/2022 1230 Gross per 24 hour  Intake 597.04 ml  Output --  Net 597.04 ml   LBM: Last BM Date : 09/25/22 Baseline Weight: Weight: 63 kg Most recent weight: Weight: 63 kg       Palliative Assessment/Data:      Patient Active Problem List   Diagnosis Date Noted   Esophageal varices ([place]) 09/22/2022   Acute GI bleeding 09/22/2022   Acute pain of right wrist 07/10/2022   Tooth pain 11/07/2021   Constipation 06/18/2021   Hypokalemia  06/18/2021   Knee pain 06/18/2021   Dizziness 04/25/2021   Vision changes 04/25/2021   Abdominal pain 04/02/2021   Hepatocellular carcinoma ([place]) 02/06/2021   Healthcare maintenance 10/03/2020   Chronic hepatitis C without hepatic coma ([place]) 09/28/2019   Erosive gastritis    Cirrhosis of liver ([place]) 09/09/2019    Palliative Care Assessment & Plan   Patient Profile: ***  Assessment: Principal Problem:   Hepatocellular carcinoma ([place]) Active Problems:   Cirrhosis of liver ([place])  Acute GI bleeding   Recommendations/Plan: ***  Goals of Care and Additional Recommendations: Limitations on Scope of Treatment: {Recommended Scope and Preferences:21019}  Code Status:    Code Status Orders  (From admission, onward)           Start     Ordered   09/24/22 1040  Do not attempt resuscitation (DNR)  Continuous       Question Answer Comment  In the event of cardiac or respiratory ARREST Do not call a "code blue"   In the event of cardiac or respiratory ARREST Do not perform Intubation, CPR, defibrillation or ACLS   In the event of cardiac or respiratory ARREST Use medication by any route, position, wound care, and other measures to relive pain and suffering. May use oxygen, suction and manual treatment of airway obstruction as needed for comfort.      09/24/22 1041           Code Status History     Date Active Date Inactive Code Status Order ID Comments User Context   09/22/2022 2026 09/24/2022 1041 Full Code [id number]  Sanjuan Dame, MD ED   05/11/2020 1122 05/12/2020 1958 Full Code [id number]  Arne Cleveland, MD Inpatient   09/09/2019 1732 09/15/2019 1757 Full Code [id number]  Karmen Bongo, MD ED       Prognosis:  {Palliative Care Prognosis:23504}  Discharge Planning: {Palliative dispostion:23505}  Care plan was discussed with ***  Thank you for allowing the Palliative Medicine Team to assist in the care of this patient.   Time In: *** Time Out: ***  Total Time *** Prolonged Time Billed  {YES NO:22349}       Greater than 50%  of this time was spent counseling and coordinating care related to the above assessment and plan.  Lin Landsman, NP  Please contact Palliative Medicine Team phone at [phone] for questions and concerns.   *Portions of this note are a verbal dictation therefore any spelling and/or grammatical errors are due to the "[place] [employer]" system interpretation.

## 2022-09-26 NOTE — Discharge Instructions (Addendum)
SUNDAYS BREAKFAST TWO LOCATIONS: 8:00am served in [facility] by Mila Doce 8:30am SHUTTLE provided from [facility], served at [employer], [address]. LUNCH TWO LOCATIONS [plus one additional third Sunday only] 10:30am - 12:30pm served at [employer], Pacific Mutual, [place] (1.2 miles from [facility]) 12:30pm served in [place] by BJ's Team ([place] Sunday only) 1:30pm served at [facility] by [facility] one location [plus one additional third Sunday only] 5:00pm Every Sunday, served under the bridge at [place]. by [place] (.7 miles from [facility]) ([place] Sunday ONLY) 4:00pm served in the parking garage, across from [employer] Energy, corner of [place] and [place] by [employer]. [employer] Ministries MONDAYS BREAKFAST 7:30am served in [employer] by the Health Net and Friends LUNCH 10:30am - 12:30pm served at [employer], Pacific Mutual, [place] (1.2 miles from [facility]) [place]: 7:00pm served in front of the courthouse at the corner of Pitney Bowes and Tech Data Corporation. by [facility] Monday Night Meal (3 blocks from [facility]) 4:30pm served at the Time Warner, [place] by Peter Kiewit Sons Not Bombs (0.6 miles from [facility]) [place] served at PepsiCo, [place] (0.3 miles from [place]) LUNCH 10:30am - 12:30pm served at the [employer], Page [address] (1.2 miles from [place]) [place] 6:00pm served at Dole Food, enter from Brink's Company and go to the Omnicom, (0.7 miles from [place]) [place] 7:00am - 8:00am served at [employer], Athena [address], (1.2 miles from [place]) [place] [plus two additional locations listed below] 10:30am - 12:30pm served at [employer], [place] [address], (1.2 miles from [place]) (FIRST Wednesday ONLY) 11:30am served at Kelly Services, [place] (6.6 miles from [place]) ([place] Wednesday ONLY) 11:00am served at [place], [place] (1.3 miles from [place]) [place] 6:00pm served at Amgen Inc, [place] Wachovia Corporation. (1.3 miles from [facility]) 4:00pm - 6:00pm (hot dogs and chips) served at NCR Corporation of [facility], [place] (1.7 miles from [place]) [place] 10:30am - 12:30pm served at [employer], Pacific Mutual, [place] [address], (1.2 miles from [place]) [place] 6:00pm served at Dole Food, enter from Brink's Company and go to the Omnicom, (0.7 miles from Semp[employer] Energy) [place] 10:30am - 12:30pm served at [employer], [place] [address] (1.2 miles from [place]) [place], [plus one additional first Friday only] 6:00pm served under the bridge at [place]. by [place]. (.7 miles from [facility]) 5:00pm - 7:00pm served at NCR Corporation of [facility], [place] (1.7 miles from [place]) (FIRST Friday ONLY) 5:45 pm - SHUTTLE provided from the [place] at 5:45pm. Served at [facility], [employer] [plus one additional last Saturday only] 8:00am served at [facility] by Family Dollar Stores  Bikers 8:30am served at Land O'Lakes, [place] KB Home	[place]. (2.2 miles from [facility]) (LAST Saturday ONLY) 8:30am served at  Sanmina-SCI, [place] (5 miles from [place]) LUNCH 10:30am - 12:30pm served at [employer], Pacific Mutual [place] Sharolyn Douglas., (1.2 miles from [facility]) [place] 6:00pm served under the bridge at Tiger. by The Procter & Gamble (0.7 miles from Sempra Energy)  [place] at E[place]. (.7 miles from Social Circle) [place] on [place] Right onto Northwest Airlines 433 ft. Continue onto [address] under bridge, about 500 ft. Courthouse (3 blocks from [facility]) [place] on [place] right on [place] 1 block to Parker Hannifin (.5 miles from [place]) [place] on [place]. Temple-Inland. past WellPoint to Cablevision Systems. Enter from Brink's Company and go to the Office Depot building Amgen Inc [address]. (1.3 miles from [facility]) [place] on [place] Right onto W. Sharolyn Douglas. church will be on the Left. Crow[place] [address] (.3 miles from [facility]) Go .3 miles on W. Friendly Destination is on your right Kelly Services at American International Group (6.6 miles from [place]) [place] on [place] toward W Friend[address] Turn right onto [address] Friend[address] Continue onto Verizon. Continue onto [place] is on right The Bridgeway AK Steel Holding Corporation) [place] [address] (.6 miles from [place]) [place] on [place]. [address] Turn Left onto E. Ravenel. 0.3 miles Destination is on the Left. [place] at R.R. Donnelley (5 miles from Sempra Energy) 1. Head south on [place] right onto W Friendly Turn slightly left onto Colgate-Palmolive Continue onto Colgate-Palmolive Turn right at [place] to church on right New Birth Sounds of [facility] [address] (1.7 miles  from [place]) [place] on [place]va 1.4 miles [place] becomes [place]. Elm [address] Continue 0.6 miles and church will be on theright. [place] at [address] (2.5 miles from Semp[employer] Energy) [place] provided from [place] on [place]. Elm toward Lowe's Companies right onto El Paso Corporation left onto Office Depot left onto Braymer Baxter International (2.2 miles from [place]) [place] on Lucien 1.4 miles [place] becomes [place]. Sallis Turn right onto [place]. and church will be on the Left. Potter's House/[place] ArvinMeritor [place] (1.2 miles from [facility]) 1.Turn right onto [facility] 2.Turn left onto Carlos Levering 3.[place]clare [place]rador 4.Destination is on your right [place] at Home Depot (1.3 miles from [facility]) [place] on [place] left onto Emerson Electric right onto S. [place] onto Fiserv. Turn left onto [place] right onto Campbell Soup.  Donald Aguilar Isbell, It was a pleasure taking care of you at [place] were admitted for complications of Hepatocellular Carcinoma.  You will be discharged on home hospice.  1) Regarding your cancer, it has progressed to a point that we cannot  treat it.  You will be going home with home hospice to take care of you. They will take care of you and make sure you are as comfortable as you can be.   Take care,  Dr. Leigh Aurora, DO

## 2022-09-26 NOTE — Consult Note (Signed)
   [facility] Inpatient Consult   09/26/2022  Donald Aguilar 10/13/1951 [id number]  [place] Organization [ACO] Patient: Medicare ACO REACH  Chart reviewed and reveals the patient is currently transitioning to [place] and currently under comfort measure.  Patient's electronic medical record reveals that patient had been active with [facility] Nurses prior to admission noted.  Plan: Patient will have full community services through Hospice and needs will be met at the hospice level of care. No [facility] Care Management is planned for transitional needs. Will sign off at transition from hospital.  For questions,   Natividad Brood, RN BSN [place]  [phone] business mobile phone Toll free office [phone]  *[place]  [phone] Fax number: [phone] [email or web address]

## 2022-09-27 NOTE — Congregational Nurse Program (Signed)
Home visit with Intake nurse Claiborne Billings from [employer] and [facility] interpreter. Patient was approved for Hospice Care and will start receiving services starting with further assessment by nurse Chapman Moss, social worker Prudencio Burly and chaplain Will Bonnet in 2-3 days.  Kelly arranged for morphine [place] to be sent to [place]. (It was ordered at hospital but never sent).  Patient's friend/landlord Y'Phi Mlo will pick it up this afternoon. Patient alert and mobile.  Complaining of mild right upper abdominal pain and dizziness when standing.  All medications were reviewed and interpreter wrote instructions in [place] on bottles.  Plan is for CN to be present for next appointment with his treatment team.  [name] RN, Congregational Nurse [phone]

## 2022-09-29 NOTE — Congregational Nurse Program (Signed)
Home visit to deliver hospital bed sheets donated by another CN.  Patient alert and mobile.  Continues to complain of right upper quadrant pain.  States he took Morphine at 7:00 am so advised patient he could take 2 Tylenol now (1:00 pm) and take second dose of Morphine at 7:00 pm.  Patient reports he has swelling in right lower groin similar to when he had hernia in left groin.  Will confer with his hospice nurse Adonis Brook regarding this complaint. Just prior to visit received call from [place] worker Prudencio Burly who was try to reach [employer].  CN able to connect them and they plan to meet with patient on Wednesday 12/13 at 1:00 pm.  Sonia Baller stated she would talk to nurse Adonis Brook regarding scheduling her visit with CN possibly tomorrow.  [name] RN, Congregational Nurse [phone]

## 2022-10-07 ENCOUNTER — Telehealth: Payer: Self-pay

## 2022-10-07 NOTE — Telephone Encounter (Signed)
Phone call from patient stating that since yesterday he has been having yellow watery BM's and that his abdomen is swollen.  Advised that he should contact Hospice.  Stated he had called multiple times but CN could not determine why he could not reach anyone.  CN called 24 hour number and the on call nurse Mariann Laster called back and will reach out to patient.  He has 10:00 am appointment tomorrow with his nurse Despina Hidden.  [name] RN, Congregational Nurse [phone]

## 2022-10-15 NOTE — Congregational Nurse Program (Signed)
Home visit with interpreter [name].  We told patient his PCP follow-up appointment was rescheduled for 10/23/2022 at 3:15 pm.  CN will request Medicaid transportation.  [place] regarding refills requested by Hospice nurse to find out when they will be ready.  They stated they can be picked up at anytime.  Patient states his friend will pick them up today.  We also reminded him to take Morphine twice a day.  Patient requested CN call to check FNS balance and date of next deposit.  [name] RN, Congregational Nurse [phone]

## 2022-10-20 DEATH — deceased

## 2022-11-13 ENCOUNTER — Encounter: Payer: Self-pay | Admitting: Gastroenterology
